# Patient Record
Sex: Female | Born: 1955 | Race: White | Hispanic: No | Marital: Married | Smoking: Current every day smoker
Health system: Southern US, Community
[De-identification: ages and names within clinical notes are randomized; demographics above are authoritative.]

## PROBLEM LIST (undated history)

## (undated) DIAGNOSIS — Z923 Personal history of irradiation: Secondary | ICD-10-CM

## (undated) DIAGNOSIS — T7840XA Allergy, unspecified, initial encounter: Secondary | ICD-10-CM

## (undated) DIAGNOSIS — Z9221 Personal history of antineoplastic chemotherapy: Secondary | ICD-10-CM

## (undated) DIAGNOSIS — H3530 Unspecified macular degeneration: Secondary | ICD-10-CM

## (undated) DIAGNOSIS — I10 Essential (primary) hypertension: Secondary | ICD-10-CM

## (undated) DIAGNOSIS — H15109 Unspecified episcleritis, unspecified eye: Secondary | ICD-10-CM

## (undated) DIAGNOSIS — M1990 Unspecified osteoarthritis, unspecified site: Secondary | ICD-10-CM

## (undated) DIAGNOSIS — E785 Hyperlipidemia, unspecified: Secondary | ICD-10-CM

## (undated) DIAGNOSIS — Z79890 Hormone replacement therapy (postmenopausal): Secondary | ICD-10-CM

## (undated) DIAGNOSIS — F17200 Nicotine dependence, unspecified, uncomplicated: Secondary | ICD-10-CM

## (undated) DIAGNOSIS — C50919 Malignant neoplasm of unspecified site of unspecified female breast: Secondary | ICD-10-CM

## (undated) DIAGNOSIS — J449 Chronic obstructive pulmonary disease, unspecified: Secondary | ICD-10-CM

## (undated) DIAGNOSIS — L409 Psoriasis, unspecified: Secondary | ICD-10-CM

## (undated) DIAGNOSIS — I6522 Occlusion and stenosis of left carotid artery: Secondary | ICD-10-CM

## (undated) DIAGNOSIS — E222 Syndrome of inappropriate secretion of antidiuretic hormone: Secondary | ICD-10-CM

## (undated) DIAGNOSIS — F419 Anxiety disorder, unspecified: Secondary | ICD-10-CM

## (undated) HISTORY — DX: Unspecified macular degeneration: H35.30

## (undated) HISTORY — DX: Syndrome of inappropriate secretion of antidiuretic hormone: E22.2

## (undated) HISTORY — DX: Occlusion and stenosis of left carotid artery: I65.22

## (undated) HISTORY — DX: Hyperlipidemia, unspecified: E78.5

## (undated) HISTORY — DX: Essential (primary) hypertension: I10

## (undated) HISTORY — DX: Psoriasis, unspecified: L40.9

## (undated) HISTORY — PX: TONSILLECTOMY: SUR1361

## (undated) HISTORY — DX: Allergy, unspecified, initial encounter: T78.40XA

## (undated) HISTORY — PX: COLPOSCOPY: SHX161

## (undated) HISTORY — DX: Chronic obstructive pulmonary disease, unspecified: J44.9

## (undated) HISTORY — DX: Nicotine dependence, unspecified, uncomplicated: F17.200

## (undated) HISTORY — DX: Hormone replacement therapy: Z79.890

## (undated) HISTORY — DX: Unspecified episcleritis, unspecified eye: H15.109

## (undated) HISTORY — PX: ABDOMINAL HYSTERECTOMY: SHX81

## (undated) HISTORY — PX: OTHER SURGICAL HISTORY: SHX169

---

## 1998-10-31 ENCOUNTER — Encounter: Admission: RE | Admit: 1998-10-31 | Discharge: 1998-10-31 | Payer: Self-pay | Admitting: Obstetrics & Gynecology

## 1998-10-31 ENCOUNTER — Encounter: Payer: Self-pay | Admitting: Obstetrics & Gynecology

## 2000-02-26 ENCOUNTER — Encounter: Payer: Self-pay | Admitting: Obstetrics & Gynecology

## 2000-02-26 ENCOUNTER — Encounter: Admission: RE | Admit: 2000-02-26 | Discharge: 2000-02-26 | Payer: Self-pay | Admitting: Obstetrics & Gynecology

## 2000-07-28 ENCOUNTER — Other Ambulatory Visit: Admission: RE | Admit: 2000-07-28 | Discharge: 2000-07-28 | Payer: Self-pay | Admitting: Obstetrics & Gynecology

## 2001-04-07 ENCOUNTER — Encounter: Admission: RE | Admit: 2001-04-07 | Discharge: 2001-04-07 | Payer: Self-pay | Admitting: Obstetrics & Gynecology

## 2001-04-07 ENCOUNTER — Encounter: Payer: Self-pay | Admitting: Obstetrics & Gynecology

## 2001-08-25 ENCOUNTER — Other Ambulatory Visit: Admission: RE | Admit: 2001-08-25 | Discharge: 2001-08-25 | Payer: Self-pay | Admitting: Obstetrics & Gynecology

## 2002-02-07 ENCOUNTER — Other Ambulatory Visit: Admission: RE | Admit: 2002-02-07 | Discharge: 2002-02-07 | Payer: Self-pay | Admitting: Obstetrics & Gynecology

## 2002-05-25 ENCOUNTER — Encounter: Payer: Self-pay | Admitting: Obstetrics & Gynecology

## 2002-05-25 ENCOUNTER — Encounter: Admission: RE | Admit: 2002-05-25 | Discharge: 2002-05-25 | Payer: Self-pay | Admitting: Obstetrics & Gynecology

## 2002-06-05 ENCOUNTER — Encounter: Admission: RE | Admit: 2002-06-05 | Discharge: 2002-06-05 | Payer: Self-pay | Admitting: Obstetrics & Gynecology

## 2002-06-05 ENCOUNTER — Encounter: Payer: Self-pay | Admitting: Obstetrics & Gynecology

## 2002-12-14 ENCOUNTER — Encounter: Admission: RE | Admit: 2002-12-14 | Discharge: 2002-12-14 | Payer: Self-pay | Admitting: Obstetrics & Gynecology

## 2003-03-13 ENCOUNTER — Other Ambulatory Visit: Admission: RE | Admit: 2003-03-13 | Discharge: 2003-03-13 | Payer: Self-pay | Admitting: Obstetrics & Gynecology

## 2003-12-13 ENCOUNTER — Encounter: Admission: RE | Admit: 2003-12-13 | Discharge: 2003-12-13 | Payer: Self-pay | Admitting: Obstetrics & Gynecology

## 2004-04-21 ENCOUNTER — Other Ambulatory Visit: Admission: RE | Admit: 2004-04-21 | Discharge: 2004-04-21 | Payer: Self-pay | Admitting: Obstetrics & Gynecology

## 2005-01-11 ENCOUNTER — Encounter: Admission: RE | Admit: 2005-01-11 | Discharge: 2005-01-11 | Payer: Self-pay | Admitting: Obstetrics & Gynecology

## 2006-01-25 ENCOUNTER — Encounter: Admission: RE | Admit: 2006-01-25 | Discharge: 2006-01-25 | Payer: Self-pay | Admitting: Obstetrics & Gynecology

## 2007-01-27 ENCOUNTER — Encounter: Admission: RE | Admit: 2007-01-27 | Discharge: 2007-01-27 | Payer: Self-pay | Admitting: Obstetrics & Gynecology

## 2008-02-28 ENCOUNTER — Encounter: Admission: RE | Admit: 2008-02-28 | Discharge: 2008-02-28 | Payer: Self-pay | Admitting: Obstetrics & Gynecology

## 2009-03-24 ENCOUNTER — Ambulatory Visit (HOSPITAL_COMMUNITY): Admission: RE | Admit: 2009-03-24 | Discharge: 2009-03-24 | Payer: Self-pay | Admitting: Family Medicine

## 2009-06-07 ENCOUNTER — Encounter: Admission: RE | Admit: 2009-06-07 | Discharge: 2009-06-07 | Payer: Self-pay | Admitting: Family Medicine

## 2009-08-29 ENCOUNTER — Encounter: Admission: RE | Admit: 2009-08-29 | Discharge: 2009-08-29 | Payer: Self-pay | Admitting: Obstetrics & Gynecology

## 2010-07-24 ENCOUNTER — Other Ambulatory Visit: Payer: Self-pay | Admitting: Obstetrics & Gynecology

## 2010-07-24 DIAGNOSIS — Z1231 Encounter for screening mammogram for malignant neoplasm of breast: Secondary | ICD-10-CM

## 2010-09-04 ENCOUNTER — Ambulatory Visit
Admission: RE | Admit: 2010-09-04 | Discharge: 2010-09-04 | Disposition: A | Discharge status: Home / Self Care | Payer: 59 | Source: Ambulatory Visit | Attending: Obstetrics & Gynecology | Admitting: Obstetrics & Gynecology

## 2010-09-04 DIAGNOSIS — Z1231 Encounter for screening mammogram for malignant neoplasm of breast: Secondary | ICD-10-CM

## 2011-09-22 ENCOUNTER — Other Ambulatory Visit: Payer: Self-pay | Admitting: Obstetrics & Gynecology

## 2011-09-22 DIAGNOSIS — Z1231 Encounter for screening mammogram for malignant neoplasm of breast: Secondary | ICD-10-CM

## 2011-10-07 ENCOUNTER — Ambulatory Visit
Admission: RE | Admit: 2011-10-07 | Discharge: 2011-10-07 | Disposition: A | Discharge status: Home / Self Care | Payer: BC Managed Care – PPO | Source: Ambulatory Visit | Attending: Obstetrics & Gynecology | Admitting: Obstetrics & Gynecology

## 2011-10-07 DIAGNOSIS — Z1231 Encounter for screening mammogram for malignant neoplasm of breast: Secondary | ICD-10-CM

## 2012-07-26 ENCOUNTER — Other Ambulatory Visit: Payer: Self-pay | Admitting: Family Medicine

## 2012-07-26 DIAGNOSIS — Z1211 Encounter for screening for malignant neoplasm of colon: Secondary | ICD-10-CM

## 2012-11-22 ENCOUNTER — Other Ambulatory Visit: Payer: Self-pay

## 2012-11-22 DIAGNOSIS — Z1231 Encounter for screening mammogram for malignant neoplasm of breast: Secondary | ICD-10-CM

## 2012-12-19 ENCOUNTER — Ambulatory Visit: Payer: BC Managed Care – PPO

## 2012-12-22 ENCOUNTER — Ambulatory Visit: Payer: BC Managed Care – PPO

## 2012-12-26 ENCOUNTER — Other Ambulatory Visit: Payer: Self-pay | Admitting: Family Medicine

## 2012-12-26 MED ORDER — BISOPROLOL-HYDROCHLOROTHIAZIDE 5-6.25 MG PO TABS: 1.0000 | ORAL_TABLET | Freq: Every day | ORAL | Status: DC

## 2012-12-26 NOTE — Telephone Encounter (Signed)
.  Rx Refilled and pt will need .Needs office visit and labs before further refills

## 2012-12-29 ENCOUNTER — Encounter: Payer: Self-pay | Admitting: Family Medicine

## 2012-12-29 ENCOUNTER — Telehealth: Payer: Self-pay | Admitting: Family Medicine

## 2012-12-29 MED ORDER — BISOPROLOL-HYDROCHLOROTHIAZIDE 5-6.25 MG PO TABS: 1.0000 | ORAL_TABLET | Freq: Every day | ORAL | Status: DC

## 2012-12-29 NOTE — Telephone Encounter (Signed)
Medication refill for one time only.  Patient needs to be seen.  Letter sent for patient to call and schedule 

## 2012-12-30 ENCOUNTER — Other Ambulatory Visit: Payer: Self-pay | Admitting: Family Medicine

## 2013-01-12 ENCOUNTER — Ambulatory Visit (INDEPENDENT_AMBULATORY_CARE_PROVIDER_SITE_OTHER): Payer: BC Managed Care – PPO | Admitting: Family Medicine

## 2013-01-12 ENCOUNTER — Encounter: Payer: Self-pay | Admitting: Family Medicine

## 2013-01-12 VITALS — BP 140/82 | HR 62 | Temp 97.4°F | Resp 14 | Ht 64.0 in | Wt 132.0 lb

## 2013-01-12 DIAGNOSIS — H15109 Unspecified episcleritis, unspecified eye: Secondary | ICD-10-CM | POA: Insufficient documentation

## 2013-01-12 DIAGNOSIS — F17200 Nicotine dependence, unspecified, uncomplicated: Secondary | ICD-10-CM | POA: Insufficient documentation

## 2013-01-12 DIAGNOSIS — M2550 Pain in unspecified joint: Secondary | ICD-10-CM

## 2013-01-12 DIAGNOSIS — L409 Psoriasis, unspecified: Secondary | ICD-10-CM | POA: Insufficient documentation

## 2013-01-12 DIAGNOSIS — Z0000 Encounter for general adult medical examination without abnormal findings: Secondary | ICD-10-CM

## 2013-01-12 DIAGNOSIS — Z79890 Hormone replacement therapy (postmenopausal): Secondary | ICD-10-CM | POA: Insufficient documentation

## 2013-01-12 MED ORDER — BISOPROLOL-HYDROCHLOROTHIAZIDE 5-6.25 MG PO TABS: ORAL_TABLET | ORAL | Status: DC

## 2013-01-12 NOTE — Progress Notes (Signed)
Subjective:    Patient ID: Kim Ortega, female    DOB: Sep 26, 1955, 58 y.o.   MRN: [id number]  HPI  Patient is a very pleasant 58 year old white female who presents today for a complete physical exam. Her last tetanus shot was in 2011. She gets her Pap smear and her pelvic exam performed at her GYN. She gets her mammogram and bone density performed at her GYN. She had a flu shot earlier this year her last colonoscopy was in 2004 and is due this year. The patient will call and schedule that. She reports significant pain in her neck, thoracic spine, shoulders, both elbows, and occasionally her knees. She has a history of psoriasis and has active plaques in her hair, on her genital region, and on her elbows. 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 Past Medical History  Diagnosis Date  . Psoriasis   . Hypertension   . Hormone replacement therapy   . Allergy   . Smoker   . Episcleritis    No current outpatient prescriptions on file prior to visit.   No current facility-administered medications on file prior to visit.   No Known Allergies Past Surgical History  Procedure Laterality Date  . Abdominal hysterectomy    . Cervical acdf      C5-6   History   Social History  . Marital Status: Married    Spouse Name: N/A    Number of Children: N/A  . Years of Education: N/A   Occupational History  . Not on file.   Social History Main Topics  . Smoking status: Current Every Day Smoker -- 0.50 packs/day    Types: Cigarettes  . Smokeless tobacco: Not on file  . Alcohol Use: Yes     Comment: Glasses of wine several times a week  . Drug Use: No  . Sexual Activity: Yes     Comment: married   Other Topics Concern  . Not on file   Social History Narrative  . No narrative on file   Family History  Problem Relation Age of Onset  . Stroke Mother      Review of Systems  All other systems reviewed and are negative.       Objective:   Physical Exam    Vitals reviewed. Constitutional: She is oriented to person, place, and time. She appears well-developed and well-nourished. No distress.  HENT:  Head: Normocephalic and atraumatic.  Right Ear: External ear normal.  Left Ear: External ear normal.  Nose: Nose normal.  Mouth/Throat: Oropharynx is clear and moist. No oropharyngeal exudate.  Eyes: Conjunctivae and EOM are normal. Pupils are equal, round, and reactive to light. Right eye exhibits no discharge. Left eye exhibits no discharge. No scleral icterus.  Neck: Normal range of motion. Neck supple. No JVD present. No tracheal deviation present. No thyromegaly present.  Cardiovascular: Normal rate, regular rhythm, normal heart sounds and intact distal pulses.  Exam reveals no gallop and no friction rub.   No murmur heard. Pulmonary/Chest: Effort normal and breath sounds normal. No stridor. No respiratory distress. She has no wheezes. She has no rales. She exhibits no tenderness.  Abdominal: Soft. Bowel sounds are normal. She exhibits no distension and no mass. There is no tenderness. There is no rebound and no guarding.  Musculoskeletal: Normal range of motion. She exhibits no edema and no tenderness.  Lymphadenopathy:    She has no cervical adenopathy.  Neurological: She is alert and oriented to  person, place, and time. She has normal reflexes. She displays normal reflexes. No cranial nerve deficit. She exhibits normal muscle tone. Coordination normal.  Skin: Skin is warm. Rash noted. She is not diaphoretic. No erythema. No pallor.  Psychiatric: She has a normal mood and affect. Her behavior is normal. Judgment and thought content normal.          Assessment & Plan:  1. Polyarthralgia I am concerned about psoriatic arthritis. I will consult rheumatology particularly given her history of iritis and episcleritis and the diffuse nature of her polyarthralgias. I will also check a sedimentation rate. - Ambulatory referral to  Rheumatology  2. Routine general medical examination at a health care facility Patient's immunizations are up-to-date. I recommended smoking cessation. I asked the patient to return fasting for a CBC, CMP, fasting lipid panel, and TSH. I recommended that the patient schedule her colonoscopy. Currently her blood pressure is borderline well controlled. I recommended she decrease sodium in her diet

## 2013-01-18 ENCOUNTER — Other Ambulatory Visit: Payer: BC Managed Care – PPO

## 2013-01-18 DIAGNOSIS — M2550 Pain in unspecified joint: Secondary | ICD-10-CM

## 2013-01-18 DIAGNOSIS — Z0000 Encounter for general adult medical examination without abnormal findings: Secondary | ICD-10-CM

## 2013-01-18 LAB — LIPID PANEL
CHOLESTEROL: 192 mg/dL (ref 0–200)
HDL: 91 mg/dL (ref >39)
LDL Cholesterol: 86 mg/dL (ref 0–99)
TRIGLYCERIDES: 75 mg/dL (ref <150)
Total CHOL/HDL Ratio: 2.1 Ratio
VLDL: 15 mg/dL (ref 0–40)

## 2013-01-18 LAB — CBC WITH DIFFERENTIAL/PLATELET
BASOS ABS: 0 10*3/uL (ref 0.0–0.1)
BASOS PCT: 0 % (ref 0–1)
EOS PCT: 1 % (ref 0–5)
Eosinophils Absolute: 0.1 10*3/uL (ref 0.0–0.7)
HEMATOCRIT: 40.9 % (ref 36.0–46.0)
Hemoglobin: 13.5 g/dL (ref 12.0–15.0)
LYMPHS ABS: 3.2 10*3/uL (ref 0.7–4.0)
LYMPHS PCT: 33 % (ref 12–46)
MCH: 33 pg (ref 26.0–34.0)
MCHC: 33 g/dL (ref 30.0–36.0)
MCV: 100 fL (ref 78.0–100.0)
MONOS PCT: 8 % (ref 3–12)
Monocytes Absolute: 0.8 10*3/uL (ref 0.1–1.0)
Neutro Abs: 5.8 10*3/uL (ref 1.7–7.7)
Neutrophils Relative %: 58 % (ref 43–77)
PLATELETS: 278 10*3/uL (ref 150–400)
RBC: 4.09 MIL/uL (ref 3.87–5.11)
RDW: 13 % (ref 11.5–15.5)
WBC: 9.9 10*3/uL (ref 4.0–10.5)

## 2013-01-18 LAB — COMPREHENSIVE METABOLIC PANEL
ALK PHOS: 39 U/L (ref 39–117)
ALT: 17 U/L (ref 0–35)
AST: 20 U/L (ref 0–37)
Albumin: 4.3 g/dL (ref 3.5–5.2)
BILIRUBIN TOTAL: 0.3 mg/dL (ref 0.3–1.2)
BUN: 11 mg/dL (ref 6–23)
CALCIUM: 9.6 mg/dL (ref 8.4–10.5)
CHLORIDE: 102 meq/L (ref 96–112)
CO2: 27 mEq/L (ref 19–32)
Creat: 0.62 mg/dL (ref 0.50–1.10)
GLUCOSE: 92 mg/dL (ref 70–99)
Potassium: 4.3 mEq/L (ref 3.5–5.3)
Sodium: 141 mEq/L (ref 135–145)
TOTAL PROTEIN: 6.7 g/dL (ref 6.0–8.3)

## 2013-01-18 LAB — TSH: TSH: 1.603 u[IU]/mL (ref 0.350–4.500)

## 2013-01-18 LAB — SEDIMENTATION RATE: Sed Rate: 1 mm/hr (ref 0–22)

## 2013-01-19 ENCOUNTER — Ambulatory Visit: Payer: BC Managed Care – PPO

## 2013-01-20 ENCOUNTER — Encounter: Payer: Self-pay | Admitting: *Deleted

## 2013-01-26 ENCOUNTER — Ambulatory Visit
Admission: RE | Admit: 2013-01-26 | Discharge: 2013-01-26 | Disposition: A | Discharge status: Home / Self Care | Payer: BC Managed Care – PPO | Source: Ambulatory Visit

## 2013-01-26 DIAGNOSIS — Z1231 Encounter for screening mammogram for malignant neoplasm of breast: Secondary | ICD-10-CM

## 2013-02-06 ENCOUNTER — Other Ambulatory Visit: Payer: Self-pay | Admitting: Family Medicine

## 2013-07-24 ENCOUNTER — Other Ambulatory Visit: Payer: Self-pay | Admitting: Family Medicine

## 2013-07-24 ENCOUNTER — Telehealth: Payer: Self-pay | Admitting: *Deleted

## 2013-07-24 DIAGNOSIS — Z1211 Encounter for screening for malignant neoplasm of colon: Secondary | ICD-10-CM

## 2013-07-24 NOTE — Telephone Encounter (Signed)
Called pt back in regarding referral to have colonoscopy, in her records showed she had appointment 08/2012, pt states she never went to have it done. I told pt I will place referral to [facility] since that was last one she went to. Pt agrees that would be fine.

## 2013-07-24 NOTE — Telephone Encounter (Signed)
Message copied by Maureen Chatters on Tue Jul 24, 2013 12:14 PM ------      Message from: Lenore Manner      Created: Tue Jul 24, 2013 10:10 AM      Regarding: referral       Contact: [phone]       Pt is calling because she is wanting a referral to have her colonscopy                        Friday's the best, please schedule a little ways out so she is able to block out her schedule ------

## 2013-09-12 ENCOUNTER — Telehealth: Payer: Self-pay | Admitting: Family Medicine

## 2013-09-12 NOTE — Telephone Encounter (Signed)
Requesting a refill on Tramadol 50mg  1 tab po q 6 hrs prn pain - I do not see it in EPIC however it states on fax that her last refill was 01/15/13  - ? OK to Refill

## 2013-09-13 MED ORDER — TRAMADOL HCL 50 MG PO TABS: 50.0000 mg | ORAL_TABLET | Freq: Four times a day (QID) | ORAL | Status: DC | PRN

## 2013-09-13 NOTE — Telephone Encounter (Signed)
Ok to refill for quantity 120?

## 2013-09-13 NOTE — Telephone Encounter (Signed)
Medication called to pharmacy. 

## 2013-09-13 NOTE — Telephone Encounter (Signed)
ok 

## 2013-09-18 ENCOUNTER — Other Ambulatory Visit: Payer: Self-pay | Admitting: Family Medicine

## 2013-10-25 ENCOUNTER — Other Ambulatory Visit: Payer: Self-pay | Admitting: Gastroenterology

## 2013-11-27 ENCOUNTER — Encounter (HOSPITAL_COMMUNITY): Payer: Self-pay

## 2013-11-27 ENCOUNTER — Ambulatory Visit (HOSPITAL_COMMUNITY): Admit: 2013-11-27 | Payer: Self-pay | Admitting: Gastroenterology

## 2013-11-27 SURGERY — COLONOSCOPY WITH PROPOFOL
Anesthesia: Monitor Anesthesia Care

## 2013-12-25 ENCOUNTER — Telehealth: Payer: Self-pay | Admitting: Family Medicine

## 2013-12-25 ENCOUNTER — Encounter: Payer: Self-pay | Admitting: Family Medicine

## 2013-12-25 MED ORDER — BISOPROLOL-HYDROCHLOROTHIAZIDE 5-6.25 MG PO TABS: ORAL_TABLET | ORAL | Status: DC

## 2013-12-25 NOTE — Telephone Encounter (Signed)
Medication refill for one time only.  Patient needs to be seen.  Letter sent for patient to call and schedule 

## 2014-01-01 ENCOUNTER — Telehealth: Payer: Self-pay | Admitting: Family Medicine

## 2014-01-01 MED ORDER — BISOPROLOL-HYDROCHLOROTHIAZIDE 5-6.25 MG PO TABS: ORAL_TABLET | ORAL | Status: DC

## 2014-01-01 NOTE — Telephone Encounter (Signed)
Med sent to pharm 

## 2014-01-01 NOTE — Telephone Encounter (Signed)
Patient is calling to get refill on her blood pressure medication, she is scheduled for a cpe in January  [phone] if any questions

## 2014-01-17 ENCOUNTER — Other Ambulatory Visit: Payer: Self-pay

## 2014-01-24 ENCOUNTER — Encounter: Payer: Self-pay | Admitting: Family Medicine

## 2014-01-24 ENCOUNTER — Other Ambulatory Visit: Payer: Self-pay

## 2014-01-24 ENCOUNTER — Ambulatory Visit (INDEPENDENT_AMBULATORY_CARE_PROVIDER_SITE_OTHER): Payer: BLUE CROSS/BLUE SHIELD | Admitting: Family Medicine

## 2014-01-24 VITALS — BP 152/90 | HR 68 | Temp 97.9°F | Resp 16 | Ht 64.0 in | Wt 137.0 lb

## 2014-01-24 DIAGNOSIS — Z0000 Encounter for general adult medical examination without abnormal findings: Secondary | ICD-10-CM

## 2014-01-24 DIAGNOSIS — Z1231 Encounter for screening mammogram for malignant neoplasm of breast: Secondary | ICD-10-CM

## 2014-01-24 NOTE — Progress Notes (Signed)
Subjective:    Patient ID: Kim Ortega, female    DOB: 11-14-1955, 59 y.o.   MRN: [id number]  HPI Patient is a very pleasant 59 year old white female who is here today for complete physical exam. She is scheduled for colonoscopy in April. She has a hysterectomy and therefore does not require Pap smears. Her mammogram was performed last January and she has another one are scheduled for this month. Her bone density is up-to-date. Her flu shot is up-to-date. 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 Unfortunately she continues to smoke. She denies any chest pain shortness of breath or dyspnea on exertion. Past Medical History  Diagnosis Date  . Psoriasis   . Hypertension   . Hormone replacement therapy   . Allergy   . Smoker   . Episcleritis    Past Surgical History  Procedure Laterality Date  . Abdominal hysterectomy    . Cervical acdf      C5-6   Current Outpatient Prescriptions on File Prior to Visit  Medication Sig Dispense Refill  . aspirin 81 MG tablet Take 81 mg by mouth daily.    . bisoprolol-hydrochlorothiazide (ZIAC) 5-6.25 MG per tablet TAKE 2 TABLETS BY MOUTH DAILY 60 tablet 0  . estradiol (ESTRACE) 0.5 MG tablet Take 0.5 mg by mouth daily.    . fluticasone (FLONASE) 50 MCG/ACT nasal spray TAKE 2 SPRAY IN EACH NOSTRIL DAILY 16 g 11  . traMADol (ULTRAM) 50 MG tablet Take 1 tablet (50 mg total) by mouth every 6 (six) hours as needed. 120 tablet 0   No current facility-administered medications on file prior to visit.   No Known Allergies History   Social History  . Marital Status: Married    Spouse Name: N/A    Number of Children: N/A  . Years of Education: N/A   Occupational History  . Not on file.   Social History Main Topics  . Smoking status: Current Every Day Smoker -- 0.50 packs/day    Types: Cigarettes  .  Smokeless tobacco: Not on file  . Alcohol Use: Yes     Comment: Glasses of wine several times a week  . Drug Use: No  . Sexual Activity: Yes     Comment: married   Other Topics Concern  . Not on file   Social History Narrative   Family History  Problem Relation Age of Onset  . Stroke Mother       Review of Systems  All other systems reviewed and are negative.      Objective:   Physical Exam  Constitutional: She is oriented to person, place, and time. She appears well-developed and well-nourished. No distress.  HENT:  Head: Normocephalic and atraumatic.  Right Ear: External ear normal.  Left Ear: External ear normal.  Nose: Nose normal.  Mouth/Throat: Oropharynx is clear and moist. No oropharyngeal exudate.  Eyes: Conjunctivae and EOM are normal. Pupils are equal, round, and reactive to light. Right eye exhibits no discharge. Left eye exhibits no discharge. No scleral icterus.  Neck: Normal range of motion. Neck supple. No JVD present. No tracheal deviation present. No thyromegaly present.  Cardiovascular: Normal rate, regular rhythm, normal heart sounds and intact distal pulses.  Exam reveals no gallop and no friction rub.   No murmur heard. Pulmonary/Chest: Effort normal and breath sounds normal.  No stridor. No respiratory distress. She has no wheezes. She has no rales. She exhibits no tenderness.  Abdominal: Soft. Bowel sounds are normal. She exhibits no distension and no mass. There is no tenderness. There is no rebound and no guarding.  Musculoskeletal: Normal range of motion.  Lymphadenopathy:    She has no cervical adenopathy.  Neurological: She is alert and oriented to person, place, and time. She has normal reflexes. She displays normal reflexes. No cranial nerve deficit. She exhibits normal muscle tone. Coordination normal.  Skin: Rash noted. She is not diaphoretic.  Psychiatric: She has a normal mood and affect. Her behavior is normal. Judgment and thought  content normal.  Vitals reviewed.         Assessment & Plan:  Routine general medical examination at a health care facility  Patient's physical exam is normal. I recommended she check her blood pressure everyday for several weeks and report the values to me. If her blood pressure is elevated, I would increase ziac.  I also recommended smoking cessation. The remainder of her preventative care is up-to-date. I would like the patient to return fasting for a CBC, CMP, fasting lipid panel, and TSH. Otherwise regular anticipatory guidance was provided.

## 2014-02-01 ENCOUNTER — Other Ambulatory Visit: Payer: BLUE CROSS/BLUE SHIELD

## 2014-02-01 DIAGNOSIS — Z0000 Encounter for general adult medical examination without abnormal findings: Secondary | ICD-10-CM

## 2014-02-01 DIAGNOSIS — F17200 Nicotine dependence, unspecified, uncomplicated: Secondary | ICD-10-CM

## 2014-02-01 DIAGNOSIS — Z79890 Hormone replacement therapy (postmenopausal): Secondary | ICD-10-CM

## 2014-02-01 DIAGNOSIS — I10 Essential (primary) hypertension: Secondary | ICD-10-CM

## 2014-02-01 DIAGNOSIS — Z79899 Other long term (current) drug therapy: Secondary | ICD-10-CM

## 2014-02-01 LAB — COMPLETE METABOLIC PANEL WITH GFR
ALT: 15 U/L (ref 0–35)
AST: 16 U/L (ref 0–37)
Albumin: 4 g/dL (ref 3.5–5.2)
Alkaline Phosphatase: 40 U/L (ref 39–117)
BILIRUBIN TOTAL: 0.5 mg/dL (ref 0.2–1.2)
BUN: 16 mg/dL (ref 6–23)
CO2: 26 meq/L (ref 19–32)
Calcium: 9.2 mg/dL (ref 8.4–10.5)
Chloride: 101 mEq/L (ref 96–112)
Creat: 0.71 mg/dL (ref 0.50–1.10)
GFR, Est African American: >89 mL/min
Glucose, Bld: 85 mg/dL (ref 70–99)
Potassium: 4.3 mEq/L (ref 3.5–5.3)
Sodium: 139 mEq/L (ref 135–145)
Total Protein: 6.2 g/dL (ref 6.0–8.3)

## 2014-02-01 LAB — CBC WITH DIFFERENTIAL/PLATELET
BASOS PCT: 0 % (ref 0–1)
Basophils Absolute: 0 10*3/uL (ref 0.0–0.1)
Eosinophils Absolute: 0.2 10*3/uL (ref 0.0–0.7)
Eosinophils Relative: 2 % (ref 0–5)
HEMATOCRIT: 39.5 % (ref 36.0–46.0)
Hemoglobin: 13.2 g/dL (ref 12.0–15.0)
Lymphocytes Relative: 39 % (ref 12–46)
Lymphs Abs: 3.6 10*3/uL (ref 0.7–4.0)
MCH: 32.9 pg (ref 26.0–34.0)
MCHC: 33.4 g/dL (ref 30.0–36.0)
MCV: 98.5 fL (ref 78.0–100.0)
MPV: 9.5 fL (ref 8.6–12.4)
Monocytes Absolute: 0.7 10*3/uL (ref 0.1–1.0)
Monocytes Relative: 7 % (ref 3–12)
NEUTROS ABS: 4.8 10*3/uL (ref 1.7–7.7)
NEUTROS PCT: 52 % (ref 43–77)
Platelets: 247 10*3/uL (ref 150–400)
RBC: 4.01 MIL/uL (ref 3.87–5.11)
RDW: 13.1 % (ref 11.5–15.5)
WBC: 9.3 10*3/uL (ref 4.0–10.5)

## 2014-02-01 LAB — LIPID PANEL
CHOLESTEROL: 185 mg/dL (ref 0–200)
HDL: 90 mg/dL (ref >39)
LDL CALC: 77 mg/dL (ref 0–99)
Total CHOL/HDL Ratio: 2.1 Ratio
Triglycerides: 88 mg/dL (ref <150)
VLDL: 18 mg/dL (ref 0–40)

## 2014-02-01 LAB — TSH: TSH: 1.462 u[IU]/mL (ref 0.350–4.500)

## 2014-02-07 ENCOUNTER — Telehealth: Payer: Self-pay | Admitting: Family Medicine

## 2014-02-07 DIAGNOSIS — I10 Essential (primary) hypertension: Secondary | ICD-10-CM

## 2014-02-07 NOTE — Telephone Encounter (Signed)
LMTRC

## 2014-02-07 NOTE — Telephone Encounter (Signed)
Patient is calling to say per dr pickard she was supposed to keep an average on her blood pressure readings and the average is 148--/96 and would like to know if he can increase her blood pressure meds if possible  [facility] [phone] if any questions

## 2014-02-07 NOTE — Telephone Encounter (Signed)
Tell the patient to double her blood pressure medication I believe she is on 5/6.25 she can double this to 10/12.5

## 2014-02-08 MED ORDER — BISOPROLOL-HYDROCHLOROTHIAZIDE 10-6.25 MG PO TABS: 1.0000 | ORAL_TABLET | Freq: Every day | ORAL | Status: DC

## 2014-02-08 MED ORDER — LOSARTAN POTASSIUM 50 MG PO TABS: 50.0000 mg | ORAL_TABLET | Freq: Every day | ORAL | Status: DC

## 2014-02-08 NOTE — Telephone Encounter (Signed)
Pt calling back.  Per provider to take two Ziac tablets daily.  Pt states already has been doing that for last year.  Asked provider.  Ziac changed to 10/6.25 dose once daily and added Losartan 50 mg daily.  Pt informed and Rx's to pharmacy.  Told pt to continue to monitor BP at home, call with any abnormal readings.  Scheduled 3 mth follow appt.

## 2014-02-13 ENCOUNTER — Ambulatory Visit
Admission: RE | Admit: 2014-02-13 | Discharge: 2014-02-13 | Disposition: A | Discharge status: Home / Self Care | Payer: BLUE CROSS/BLUE SHIELD | Source: Ambulatory Visit

## 2014-02-13 DIAGNOSIS — Z1231 Encounter for screening mammogram for malignant neoplasm of breast: Secondary | ICD-10-CM

## 2014-02-15 ENCOUNTER — Other Ambulatory Visit: Payer: Self-pay | Admitting: Obstetrics & Gynecology

## 2014-02-15 DIAGNOSIS — R928 Other abnormal and inconclusive findings on diagnostic imaging of breast: Secondary | ICD-10-CM

## 2014-02-26 ENCOUNTER — Ambulatory Visit
Admission: RE | Admit: 2014-02-26 | Discharge: 2014-02-26 | Disposition: A | Discharge status: Home / Self Care | Payer: BLUE CROSS/BLUE SHIELD | Source: Ambulatory Visit | Attending: Obstetrics & Gynecology | Admitting: Obstetrics & Gynecology

## 2014-02-26 ENCOUNTER — Other Ambulatory Visit: Payer: Self-pay | Admitting: Obstetrics & Gynecology

## 2014-02-26 DIAGNOSIS — R928 Other abnormal and inconclusive findings on diagnostic imaging of breast: Secondary | ICD-10-CM

## 2014-04-11 ENCOUNTER — Other Ambulatory Visit: Payer: Self-pay | Admitting: Gastroenterology

## 2014-04-12 ENCOUNTER — Encounter (HOSPITAL_COMMUNITY): Payer: Self-pay | Admitting: *Deleted

## 2014-04-16 ENCOUNTER — Encounter (HOSPITAL_COMMUNITY): Payer: Self-pay | Admitting: Gastroenterology

## 2014-04-16 ENCOUNTER — Ambulatory Visit (HOSPITAL_COMMUNITY): Payer: BLUE CROSS/BLUE SHIELD | Admitting: Anesthesiology

## 2014-04-16 ENCOUNTER — Encounter (HOSPITAL_COMMUNITY)
Admission: RE | Disposition: A | Discharge status: Home / Self Care | Payer: BLUE CROSS/BLUE SHIELD | Source: Ambulatory Visit | Attending: Gastroenterology

## 2014-04-16 ENCOUNTER — Ambulatory Visit (HOSPITAL_COMMUNITY)
Admission: RE | Admit: 2014-04-16 | Discharge: 2014-04-16 | Disposition: A | Discharge status: Home / Self Care | Payer: BLUE CROSS/BLUE SHIELD | Source: Ambulatory Visit | Attending: Gastroenterology | Admitting: Gastroenterology

## 2014-04-16 DIAGNOSIS — Z90710 Acquired absence of both cervix and uterus: Secondary | ICD-10-CM | POA: Diagnosis not present

## 2014-04-16 DIAGNOSIS — I10 Essential (primary) hypertension: Secondary | ICD-10-CM | POA: Diagnosis not present

## 2014-04-16 DIAGNOSIS — F17210 Nicotine dependence, cigarettes, uncomplicated: Secondary | ICD-10-CM | POA: Diagnosis not present

## 2014-04-16 DIAGNOSIS — M1990 Unspecified osteoarthritis, unspecified site: Secondary | ICD-10-CM | POA: Insufficient documentation

## 2014-04-16 DIAGNOSIS — Z1211 Encounter for screening for malignant neoplasm of colon: Secondary | ICD-10-CM | POA: Insufficient documentation

## 2014-04-16 DIAGNOSIS — Z7982 Long term (current) use of aspirin: Secondary | ICD-10-CM | POA: Diagnosis not present

## 2014-04-16 HISTORY — DX: Anxiety disorder, unspecified: F41.9

## 2014-04-16 HISTORY — PX: COLONOSCOPY WITH PROPOFOL: SHX5780

## 2014-04-16 HISTORY — DX: Unspecified osteoarthritis, unspecified site: M19.90

## 2014-04-16 SURGERY — COLONOSCOPY WITH PROPOFOL
Anesthesia: Monitor Anesthesia Care

## 2014-04-16 MED ORDER — LACTATED RINGERS IV SOLN
INTRAVENOUS | Status: DC
  Administered 2014-04-16: 1000 mL via INTRAVENOUS

## 2014-04-16 MED ORDER — PROPOFOL 10 MG/ML IV BOLUS
INTRAVENOUS | Status: AC
  Filled 2014-04-16: qty 20

## 2014-04-16 MED ORDER — SODIUM CHLORIDE 0.9 % IV SOLN: INTRAVENOUS | Status: DC

## 2014-04-16 MED ORDER — PROPOFOL 10 MG/ML IV BOLUS
INTRAVENOUS | Status: DC | PRN
  Administered 2014-04-16 (×2): 20 mg via INTRAVENOUS
  Administered 2014-04-16: 50 mg via INTRAVENOUS
  Administered 2014-04-16: 20 mg via INTRAVENOUS
  Administered 2014-04-16: 50 mg via INTRAVENOUS
  Administered 2014-04-16: 20 mg via INTRAVENOUS
  Administered 2014-04-16: 10 mg via INTRAVENOUS
  Administered 2014-04-16: 30 mg via INTRAVENOUS
  Administered 2014-04-16 (×2): 10 mg via INTRAVENOUS

## 2014-04-16 SURGICAL SUPPLY — 21 items

## 2014-04-16 NOTE — Transfer of Care (Signed)
Immediate Anesthesia Transfer of Care Note  Patient: Kim Ortega  Procedure(s) Performed: Procedure(s): COLONOSCOPY WITH PROPOFOL (N/A)  Patient Location: PACU  Anesthesia Type:MAC  Level of Consciousness: sedated  Airway & Oxygen Therapy: Patient Spontanous Breathing and Patient connected to nasal cannula oxygen  Post-op Assessment: Report given to RN and Post -op Vital signs reviewed and stable  Post vital signs: Reviewed and stable  Last Vitals:  Filed Vitals:   04/16/14 0818  BP: 125/67  Pulse: 67  Temp: 36.8 C  Resp: 19    Complications: No apparent anesthesia complications

## 2014-04-16 NOTE — Anesthesia Postprocedure Evaluation (Signed)
  Anesthesia Post-op Note  Patient: Kim Ortega  Procedure(s) Performed: Procedure(s) (LRB): COLONOSCOPY WITH PROPOFOL (N/A)  Patient Location: PACU  Anesthesia Type: MAC  Level of Consciousness: awake and alert   Airway and Oxygen Therapy: Patient Spontanous Breathing  Post-op Pain: mild  Post-op Assessment: Post-op Vital signs reviewed, Patient's Cardiovascular Status Stable, Respiratory Function Stable, Patent Airway and No signs of Nausea or vomiting  Last Vitals:  Filed Vitals:   04/16/14 1004  BP:   Pulse: 75  Temp: 37 C  Resp: 18    Post-op Vital Signs: stable   Complications: No apparent anesthesia complications

## 2014-04-16 NOTE — H&P (Signed)
  Procedure: Screening colonoscopy. 07/10/2002 normal screening colonoscopy using propofol sedation  History: The patient is a 59 year old female born August 28, 1955. She is scheduled to undergo a repeat screening colonoscopy  Medication allergies: None  Past medical history: Hypertension. Psoriatic arthritis. Hysterectomy. Cervical spine fusion surgery  Family history: No family history of colon cancer  Habits: The patient smokes one half pack of cigarettes per day.  Exam: Patient is alert and lying comfortably on the endoscopy stretcher. Abdomen is soft and nontender to palpation. Cardiac exam reveals a regular rhythm. Lungs are clear to auscultation.  Plan: Proceed with repeat screening colonoscopy using propofol sedation

## 2014-04-16 NOTE — Op Note (Signed)
Procedure: Screening colonoscopy. 07/10/2002 normal screening colonoscopy using propofol sedation  Endoscopist: Earle Gell  Premedication: Propofol administered by anesthesia  Procedure: The patient was placed in the left lateral decubitus position. Anal inspection and digital rectal exam were normal. The Pentax pediatric colonoscope was introduced into the rectum and advanced to the cecum. A normal-appearing ileocecal valve and appendiceal orifice were identified. Colonic preparation for the exam today was good. Withdrawal time was 9 minutes  Rectum. Normal. Retroflexed view of the distal rectum normal  Sigmoid colon and descending colon. Normal  Splenic flexure. Normal  Transverse colon. Normal  Hepatic flexure. Normal  Ascending colon. Normal  Cecum and ileocecal valve. Normal  Assessment: Normal screening colonoscopy  Recommendation: Schedule repeat screening colonoscopy in 10 years

## 2014-04-16 NOTE — Discharge Instructions (Signed)

## 2014-04-16 NOTE — Anesthesia Preprocedure Evaluation (Signed)
Anesthesia Evaluation  Patient identified by MRN, date of birth, ID band Patient awake    Reviewed: Allergy & Precautions, NPO status , Patient's Chart, lab work & pertinent test results  Airway Mallampati: II  TM Distance: >3 FB Neck ROM: Full    Dental no notable dental hx.    Pulmonary Current Smoker,  breath sounds clear to auscultation  Pulmonary exam normal       Cardiovascular hypertension, Pt. on medications and Pt. on home beta blockers Rhythm:Regular Rate:Normal     Neuro/Psych negative neurological ROS  negative psych ROS   GI/Hepatic negative GI ROS, Neg liver ROS,   Endo/Other  negative endocrine ROS  Renal/GU negative Renal ROS  negative genitourinary   Musculoskeletal negative musculoskeletal ROS (+)   Abdominal   Peds negative pediatric ROS (+)  Hematology negative hematology ROS (+)   Anesthesia Other Findings   Reproductive/Obstetrics negative OB ROS                             Anesthesia Physical Anesthesia Plan  ASA: II  Anesthesia Plan: MAC   Post-op Pain Management:    Induction: Intravenous  Airway Management Planned: Simple Face Mask  Additional Equipment:   Intra-op Plan:   Post-operative Plan:   Informed Consent: I have reviewed the patients History and Physical, chart, labs and discussed the procedure including the risks, benefits and alternatives for the proposed anesthesia with the patient or authorized representative who has indicated his/her understanding and acceptance.   Dental advisory given  Plan Discussed with: CRNA and Surgeon  Anesthesia Plan Comments:         Anesthesia Quick Evaluation

## 2014-04-17 ENCOUNTER — Encounter (HOSPITAL_COMMUNITY): Payer: Self-pay | Admitting: Gastroenterology

## 2014-05-08 ENCOUNTER — Other Ambulatory Visit: Payer: Self-pay | Admitting: Family Medicine

## 2014-05-10 ENCOUNTER — Ambulatory Visit (INDEPENDENT_AMBULATORY_CARE_PROVIDER_SITE_OTHER): Payer: BLUE CROSS/BLUE SHIELD | Admitting: Family Medicine

## 2014-05-10 ENCOUNTER — Encounter: Payer: Self-pay | Admitting: Family Medicine

## 2014-05-10 VITALS — BP 160/96 | HR 60 | Temp 98.2°F | Resp 18 | Ht 63.0 in | Wt 132.0 lb

## 2014-05-10 DIAGNOSIS — I10 Essential (primary) hypertension: Secondary | ICD-10-CM | POA: Diagnosis not present

## 2014-05-10 LAB — BASIC METABOLIC PANEL WITH GFR
BUN: 17 mg/dL (ref 6–23)
CALCIUM: 9.8 mg/dL (ref 8.4–10.5)
CO2: 24 mEq/L (ref 19–32)
Chloride: 99 mEq/L (ref 96–112)
Creat: 0.87 mg/dL (ref 0.50–1.10)
GFR, EST AFRICAN AMERICAN: 84 mL/min
GFR, Est Non African American: 73 mL/min
Glucose, Bld: 88 mg/dL (ref 70–99)
Potassium: 4.6 mEq/L (ref 3.5–5.3)
Sodium: 138 mEq/L (ref 135–145)

## 2014-05-10 MED ORDER — BISOPROLOL-HYDROCHLOROTHIAZIDE 10-6.25 MG PO TABS: 1.0000 | ORAL_TABLET | Freq: Every day | ORAL | Status: DC

## 2014-05-10 MED ORDER — LOSARTAN POTASSIUM 50 MG PO TABS: 50.0000 mg | ORAL_TABLET | Freq: Every day | ORAL | Status: DC

## 2014-05-10 MED ORDER — FLUTICASONE PROPIONATE 50 MCG/ACT NA SUSP: NASAL | Status: DC

## 2014-05-10 NOTE — Progress Notes (Signed)
Subjective:    Patient ID: Kim Ortega, female    DOB: 06/18/1955, 59 y.o.   MRN: [id number]  HPI Patient is here today for follow-up. Her blood pressure is significantly elevated. 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 Her blood pressure is always consistently 120/80. Today seems to be a complete aberration. She denies any chest pain shortness of breath or dyspnea on exertion. Mammogram, colonoscopy, and Pap smear up-to-date. Most recent lab work is listed below: No visits with results within 1 Month(s) from this visit. Latest known visit with results is:  Lab on 02/01/2014  Component Date Value Ref Range Status  . Sodium 02/01/2014 139  135 - 145 mEq/L Final  . Potassium 02/01/2014 4.3  3.5 - 5.3 mEq/L Final  . Chloride 02/01/2014 101  96 - 112 mEq/L Final  . CO2 02/01/2014 26  19 - 32 mEq/L Final  . Glucose, Bld 02/01/2014 85  70 - 99 mg/dL Final  . BUN 02/01/2014 16  6 - 23 mg/dL Final  . Creat 02/01/2014 0.71  0.50 - 1.10 mg/dL Final  . Total Bilirubin 02/01/2014 0.5  0.2 - 1.2 mg/dL Final  . Alkaline Phosphatase 02/01/2014 40  39 - 117 U/L Final  . AST 02/01/2014 16  0 - 37 U/L Final  . ALT 02/01/2014 15  0 - 35 U/L Final  . Total Protein 02/01/2014 6.2  6.0 - 8.3 g/dL Final  . Albumin 02/01/2014 4.0  3.5 - 5.2 g/dL Final  . Calcium 02/01/2014 9.2  8.4 - 10.5 mg/dL Final  . GFR, Est African American 02/01/2014 >89   Final  . GFR, Est Non African American 02/01/2014 >89   Final   Comment:   The estimated GFR is a calculation valid for adults (>=73 years old) that uses the CKD-EPI algorithm to adjust for age and sex. It is   not to be used for children, pregnant women, hospitalized patients,    patients on dialysis, or with rapidly changing kidney function. According to the NKDEP, eGFR >89 is normal, 60-89 shows mild impairment, 30-59 shows moderate impairment, 15-29 shows severe impairment and <15 is ESRD.     [name]  Cholesterol 02/01/2014 185  0 - 200 mg/dL Final   Comment: ATP III Classification:       < 200        mg/dL        Desirable      200 - 239     mg/dL        Borderline High      >= 240        mg/dL        High     . Triglycerides 02/01/2014 88  <150 mg/dL Final  . HDL 02/01/2014 90  >39 mg/dL Final  . Total CHOL/HDL Ratio 02/01/2014 2.1   Final  . VLDL 02/01/2014 18  0 - 40 mg/dL Final  . LDL Cholesterol 02/01/2014 77  0 - 99 mg/dL Final   Comment:   Total Cholesterol/HDL Ratio:CHD Risk                        Coronary Heart Disease Risk Table                                        Men  Women          1/2 Average Risk              3.4        3.3              Average Risk              5.0        4.4           2X Average Risk              9.6        7.1           3X Average Risk             23.4       11.0 Use the calculated Patient Ratio above and the CHD Risk table  to determine the patient's CHD Risk. ATP III Classification (LDL):       < 100        mg/dL         Optimal      100 - 129     mg/dL         Near or Above Optimal      130 - 159     mg/dL         Borderline High      160 - 189     mg/dL         High       > 190        mg/dL         Very High     . WBC 02/01/2014 9.3  4.0 - 10.5 K/uL Final  . RBC 02/01/2014 4.01  3.87 - 5.11 MIL/uL Final  . Hemoglobin 02/01/2014 13.2  12.0 - 15.0 g/dL Final  . HCT 02/01/2014 39.5  36.0 - 46.0 % Final  . MCV 02/01/2014 98.5  78.0 - 100.0 fL Final  . MCH 02/01/2014 32.9  26.0 - 34.0 pg Final  . MCHC 02/01/2014 33.4  30.0 - 36.0 g/dL Final  . RDW 02/01/2014 13.1  11.5 - 15.5 % Final  . Platelets 02/01/2014 247  150 - 400 K/uL Final  . MPV 02/01/2014 9.5  8.6 - 12.4 fL Final   ** Please note change in reference range(s). **  . Neutrophils Relative % 02/01/2014 52  43 - 77 % Final  . Neutro Abs 02/01/2014 4.8  1.7 - 7.7 K/uL Final  . Lymphocytes Relative 02/01/2014 39  12 - 46 % Final  . Lymphs Abs 02/01/2014 3.6  0.7 - 4.0 K/uL  Final  . Monocytes Relative 02/01/2014 7  3 - 12 % Final  . Monocytes Absolute 02/01/2014 0.7  0.1 - 1.0 K/uL Final  . Eosinophils Relative 02/01/2014 2  0 - 5 % Final  . Eosinophils Absolute 02/01/2014 0.2  0.0 - 0.7 K/uL Final  . Basophils Relative 02/01/2014 0  0 - 1 % Final  . Basophils Absolute 02/01/2014 0.0  0.0 - 0.1 K/uL Final  . Smear Review 02/01/2014 Criteria for review not met   Final  . TSH 02/01/2014 1.462  0.350 - 4.500 uIU/mL Final    Past Medical History  Diagnosis Date  . Psoriasis   . Hypertension   . Hormone replacement therapy   . Allergy   . Smoker   . Episcleritis   . Anxiety     mild no regular meds  .  Arthritis     psoriatic arthritis -back   Past Surgical History  Procedure Laterality Date  . Abdominal hysterectomy    . Cervical acdf      C5-6- fusion with plates/screws "Dr. Sherwood Gambler" '12  . Tonsillectomy      child  . Laporotomy      Ovarian cyst  . Colposcopy    . Colonoscopy with propofol N/A 04/16/2014    Procedure: COLONOSCOPY WITH PROPOFOL;  Surgeon: Garlan Fair, MD;  Location: [facility];  Service: Endoscopy;  Laterality: N/A;   Current Outpatient Prescriptions on File Prior to Visit  Medication Sig Dispense Refill  . ALPRAZolam (XANAX) 0.5 MG tablet Take 0.5 mg by mouth 3 (three) times daily as needed for anxiety.    [name] aspirin 81 MG tablet Take 81 mg by mouth daily.    . bisoprolol-hydrochlorothiazide (ZIAC) 10-6.25 MG per tablet TAKE 1 TABLET BY MOUTH DAILY. 90 tablet 3  . Calcium Carbonate-Vitamin D (CALCIUM-D PO) Take 1,200 mg by mouth 2 (two) times daily.    . cetirizine (ZYRTEC) 10 MG tablet Take 10 mg by mouth daily.    [name] desonide (DESOWEN) 0.05 % cream Apply 1 application topically daily. Psoriasis patches    . diclofenac sodium (VOLTAREN) 1 % GEL Apply 2 g topically 3 (three) times daily as needed (Pain).     [name] estradiol (ESTRACE) 2 MG tablet Take 2 mg by mouth daily.    . fluticasone (FLONASE) 50 MCG/ACT nasal spray TAKE  2 SPRAY IN EACH NOSTRIL DAILY 16 g 11  . Ketotifen Fumarate (ALLERGY EYE DROPS OP) Apply 1 drop to eye 2 (two) times daily as needed (allegies).    . losartan (COZAAR) 50 MG tablet TAKE 1 TABLET BY MOUTH ONCE DAILY. 90 tablet 3  . Multiple Vitamin (MULTIVITAMIN WITH MINERALS) TABS tablet Take 1 tablet by mouth daily.    . traMADol (ULTRAM) 50 MG tablet Take 1 tablet (50 mg total) by mouth every 6 (six) hours as needed. (Patient taking differently: Take 50 mg by mouth every 6 (six) hours as needed for moderate pain. ) 120 tablet 0   No current facility-administered medications on file prior to visit.   No Known Allergies History   Social History  . Marital Status: Married    Spouse Name: N/A  . Number of Children: N/A  . Years of Education: N/A   Occupational History  . Not on file.   Social History Main Topics  . Smoking status: Current Every Day Smoker -- 0.50 packs/day for 20 years    Types: Cigarettes  . Smokeless tobacco: Not on file  . Alcohol Use: Yes     Comment: Glasses of wine several times a week  . Drug Use: No  . Sexual Activity: Yes     Comment: married   Other Topics Concern  . Not on file   Social History Narrative   Family History  Problem Relation Age of Onset  . Stroke Mother      Review of Systems  All other systems reviewed and are negative.      Objective:   Physical Exam  Constitutional: She appears well-developed and well-nourished.  Cardiovascular: Normal rate, regular rhythm and normal heart sounds.   No murmur heard. Pulmonary/Chest: Effort normal and breath sounds normal. No respiratory distress. She has no wheezes. She has no rales.  Abdominal: Soft. Bowel sounds are normal. She exhibits no distension. There is no tenderness. There is no rebound and no guarding.  Musculoskeletal: She exhibits no edema.  Vitals reviewed.         Assessment & Plan:  Benign essential HTN - Plan: BASIC METABOLIC PANEL WITH GFR  Blood pressures  reported at home are excellent. Blood pressure here today is very elevated. I've asked the patient to check her blood pressure manually several times over the next week and report the values to me. If consistently elevated, I would augment her blood pressure medications to address. Her lab work is excellent her cancer screening is up-to-date.

## 2014-05-28 ENCOUNTER — Encounter: Payer: Self-pay | Admitting: Family Medicine

## 2015-01-05 DIAGNOSIS — Z9221 Personal history of antineoplastic chemotherapy: Secondary | ICD-10-CM

## 2015-01-05 HISTORY — DX: Personal history of antineoplastic chemotherapy: Z92.21

## 2015-01-09 ENCOUNTER — Encounter: Payer: Self-pay | Admitting: Family Medicine

## 2015-01-09 ENCOUNTER — Ambulatory Visit (INDEPENDENT_AMBULATORY_CARE_PROVIDER_SITE_OTHER): Payer: BC Managed Care – PPO | Admitting: Family Medicine

## 2015-01-09 VITALS — BP 132/88 | HR 64 | Temp 98.2°F | Resp 14 | Ht 64.0 in | Wt 133.0 lb

## 2015-01-09 DIAGNOSIS — R0989 Other specified symptoms and signs involving the circulatory and respiratory systems: Secondary | ICD-10-CM | POA: Diagnosis not present

## 2015-01-09 NOTE — Progress Notes (Signed)
Subjective:    Patient ID: Kim Ortega, female    DOB: Apr 01, 1955, 60 y.o.   MRN: [id number]  HPI   patient reports 3 weeks of pulsatile tinnitus in the right ear. She denies any headaches. She denies any vision changes. She denies any hearing loss. She denies any pain in the right ear. She does have a past medical history of smoking as well as hypertension. She also has a strong family history of stroke in her mother.  She denies any vertigo or hearing loss Past Medical History  Diagnosis Date  . Psoriasis   . Hypertension   . Hormone replacement therapy   . Allergy   . Smoker   . Episcleritis   . Anxiety     mild no regular meds  . Arthritis     psoriatic arthritis -back   Past Surgical History  Procedure Laterality Date  . Abdominal hysterectomy    . Cervical acdf      C5-6- fusion with plates/screws "Dr. Sherwood Gambler" '12  . Tonsillectomy      child  . Laporotomy      Ovarian cyst  . Colposcopy    . Colonoscopy with propofol N/A 04/16/2014    Procedure: COLONOSCOPY WITH PROPOFOL;  Surgeon: Garlan Fair, MD;  Location: [facility];  Service: Endoscopy;  Laterality: N/A;   Current Outpatient Prescriptions on File Prior to Visit  Medication Sig Dispense Refill  . ALPRAZolam (XANAX) 0.5 MG tablet Take 0.5 mg by mouth 3 (three) times daily as needed for anxiety.    [name] aspirin 81 MG tablet Take 81 mg by mouth daily.    . bisoprolol-hydrochlorothiazide (ZIAC) 10-6.25 MG per tablet Take 1 tablet by mouth daily. 30 tablet 11  . Calcium Carbonate-Vitamin D (CALCIUM-D PO) Take 1,200 mg by mouth 2 (two) times daily.    . cetirizine (ZYRTEC) 10 MG tablet Take 10 mg by mouth daily.    [name] desonide (DESOWEN) 0.05 % cream Apply 1 application topically daily. Psoriasis patches    . diclofenac sodium (VOLTAREN) 1 % GEL Apply 2 g topically 3 (three) times daily as needed (Pain).     [name] estradiol (ESTRACE) 2 MG tablet Take 2 mg by mouth daily.    . fluticasone (FLONASE) 50 MCG/ACT nasal  spray TAKE 2 SPRAY IN EACH NOSTRIL DAILY 16 g 11  . Ketotifen Fumarate (ALLERGY EYE DROPS OP) Apply 1 drop to eye 2 (two) times daily as needed (allegies).    . losartan (COZAAR) 50 MG tablet Take 1 tablet (50 mg total) by mouth daily. 30 tablet 11  . Multiple Vitamin (MULTIVITAMIN WITH MINERALS) TABS tablet Take 1 tablet by mouth daily.    . traMADol (ULTRAM) 50 MG tablet Take 1 tablet (50 mg total) by mouth every 6 (six) hours as needed. (Patient taking differently: Take 50 mg by mouth every 6 (six) hours as needed for moderate pain. ) 120 tablet 0   No current facility-administered medications on file prior to visit.   No Known Allergies Social History   Social History  . Marital Status: Married    Spouse Name: N/A  . Number of Children: N/A  . Years of Education: N/A   Occupational History  . Not on file.   Social History Main Topics  . Smoking status: Current Every Day Smoker -- 0.50 packs/day for 20 years    Types: Cigarettes  . Smokeless tobacco: Not on file  . Alcohol Use: Yes     Comment: Glasses  of wine several times a week  . Drug Use: No  . Sexual Activity: Yes     Comment: married   Other Topics Concern  . Not on file   Social History Narrative     Review of Systems  All other systems reviewed and are negative.      Objective:   Physical Exam  Constitutional: She appears well-developed and well-nourished.  HENT:  Right Ear: External ear normal.  Left Ear: External ear normal.  Nose: Nose normal.  Mouth/Throat: Oropharynx is clear and moist. No oropharyngeal exudate.  Neck: Neck supple.  Cardiovascular: Normal rate, regular rhythm and normal heart sounds.   Pulses:      Carotid pulses are on the right side with bruit, and on the left side with bruit. Pulmonary/Chest: Effort normal and breath sounds normal. No stridor.  Lymphadenopathy:    She has no cervical adenopathy.  Vitals reviewed.         Assessment & Plan:  Bilateral carotid  bruits   Patient has soft bilateral carotid bruits and no cardiac murmur. Therefore I will schedule the patient for carotid Dopplers to evaluate for internal carotid artery stenosis as a possible cause of her pulsatile tinnitus

## 2015-01-13 ENCOUNTER — Encounter: Payer: Self-pay | Admitting: *Deleted

## 2015-01-16 ENCOUNTER — Ambulatory Visit (HOSPITAL_COMMUNITY)
Admission: RE | Admit: 2015-01-16 | Discharge: 2015-01-16 | Disposition: A | Discharge status: Home / Self Care | Payer: BC Managed Care – PPO | Source: Ambulatory Visit | Attending: Family Medicine | Admitting: Family Medicine

## 2015-01-16 DIAGNOSIS — I6523 Occlusion and stenosis of bilateral carotid arteries: Secondary | ICD-10-CM | POA: Insufficient documentation

## 2015-01-16 DIAGNOSIS — R0989 Other specified symptoms and signs involving the circulatory and respiratory systems: Secondary | ICD-10-CM | POA: Diagnosis not present

## 2015-01-16 DIAGNOSIS — I10 Essential (primary) hypertension: Secondary | ICD-10-CM | POA: Insufficient documentation

## 2015-02-13 ENCOUNTER — Encounter: Payer: Self-pay | Admitting: Family Medicine

## 2015-03-26 ENCOUNTER — Encounter: Payer: Self-pay | Admitting: Physician Assistant

## 2015-03-26 ENCOUNTER — Encounter: Payer: Self-pay | Admitting: Family Medicine

## 2015-03-26 ENCOUNTER — Ambulatory Visit (INDEPENDENT_AMBULATORY_CARE_PROVIDER_SITE_OTHER): Payer: BC Managed Care – PPO | Admitting: Physician Assistant

## 2015-03-26 VITALS — BP 164/94 | HR 72 | Temp 98.4°F | Resp 18 | Wt 128.0 lb

## 2015-03-26 DIAGNOSIS — M545 Low back pain, unspecified: Secondary | ICD-10-CM

## 2015-03-26 MED ORDER — HYDROCODONE-ACETAMINOPHEN 5-325 MG PO TABS: 1.0000 | ORAL_TABLET | Freq: Four times a day (QID) | ORAL | Status: DC | PRN

## 2015-03-26 MED ORDER — PREDNISONE 20 MG PO TABS: ORAL_TABLET | ORAL | Status: DC

## 2015-03-26 NOTE — Progress Notes (Signed)
Patient ID: Kim Ortega MRN: [id number], DOB: 02/21/1955, 60 y.o. Date of Encounter: 03/26/2015, 8:39 AM    Chief Complaint:  Chief Complaint  Patient presents with  . severe lower back psain x 3 days    no known injury, pain across low back w/o radiation     HPI: 60 y.o. year old white female presents with above.   She states that she has been having muscle spasms along both sides of her low back--points to level of T12 - L1 bilaterally.  She is sitting up, stiff, moves her whole body to turn toward me.   She states that she never had any acute onset symptoms. Says that on Saturday 03/22/15 she did her usual chores around the house including a little yard work. Says that at that time she never had any back pain or problem. Says that when she went woke up Sunday morning, she "almost fell in the floor". Spasms started then.   States that she saw Dr. Estanislado Pandy last week for a routine visit to follow-up her arthritis. Says that she prescribed tizanidine 4 mg 1 daily at bedtime to use for general muscle aches--- says that she "carries a lot of tension in her neck"--- this was prescribed to use for this.  Since she did have this medication available, she has been taking this at night--- but it does not seem to be helping much.  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 Says that her job includes sitting, standing.  she has been going to work and has an Environmental consultant carry the charts etc. and she has sometimes been lying on exam table to relieve her back. She has been alternating heat and cold and has been taking Motrin and has even taken some tramadol but says that that does not touch the pain and needs something stronger for pain.  No radiation of the pain. Just on both sides of her back around T12-L1 level. No pain, numbness, tingling, weakness in either of her legs or feet.     Home Meds:   Outpatient Prescriptions Prior to Visit  Medication Sig Dispense Refill  .  ALPRAZolam (XANAX) 0.5 MG tablet Take 0.5 mg by mouth 3 (three) times daily as needed for anxiety.    [name] aspirin 81 MG tablet Take 81 mg by mouth daily.    . bisoprolol-hydrochlorothiazide (ZIAC) 10-6.25 MG per tablet Take 1 tablet by mouth daily. 30 tablet 11  . Calcium Carbonate-Vitamin D (CALCIUM-D PO) Take 1,200 mg by mouth 2 (two) times daily.    . cetirizine (ZYRTEC) 10 MG tablet Take 10 mg by mouth daily.    [name] desonide (DESOWEN) 0.05 % cream Apply 1 application topically daily. Psoriasis patches    . diclofenac sodium (VOLTAREN) 1 % GEL Apply 2 g topically 3 (three) times daily as needed (Pain).     [name] estradiol (ESTRACE) 2 MG tablet Take 2 mg by mouth daily.    . fluticasone (FLONASE) 50 MCG/ACT nasal spray TAKE 2 SPRAY IN EACH NOSTRIL DAILY 16 g 11  . Ketotifen Fumarate (ALLERGY EYE DROPS OP) Apply 1 drop to eye 2 (two) times daily as needed (allegies).    . losartan (COZAAR) 50 MG tablet Take 1 tablet (50 mg total) by mouth daily. 30 tablet 11  . Multiple Vitamin (MULTIVITAMIN WITH MINERALS) TABS tablet Take 1 tablet by mouth daily.    . traMADol (ULTRAM) 50 MG tablet Take 1 tablet (50 mg total) by mouth  every 6 (six) hours as needed. (Patient not taking: Reported on 03/26/2015) 120 tablet 0   No facility-administered medications prior to visit.    Allergies: No Known Allergies    Review of Systems: See HPI for pertinent ROS. All other ROS negative.    Physical Exam: Blood pressure 164/94, pulse 72, temperature 98.4 F (36.9 C), temperature source Oral, resp. rate 18, weight 128 lb (58.06 kg)., Body mass index is 21.96 kg/(m^2). General:  WNWD WF. Appears in no acute distress. Neck: Supple. No thyromegaly. No lymphadenopathy. Lungs: Clear bilaterally to auscultation without wheezes, rales, or rhonchi. Breathing is unlabored. Heart: Regular rhythm. No murmurs, rubs, or gallops. Msk:  Strength and tone normal for age. Back Pain at T12-L1 level bilaterally.  Extremities/Skin: Warm  and dry.  Neuro: Alert and oriented X 3. Moves all extremities spontaneously. Gait is normal. CNII-XII grossly in tact. Psych:  Responds to questions appropriately with a normal affect.     ASSESSMENT AND PLAN:  60 y.o. year old female with  1. Bilateral low back pain without sciatica She is to take the Tizanidine 3 times a day.  She is to take the prednisone taper as directed. She can use the hydrocodone as needed for breakthrough pain. We are writing her a note to be out of work today through Friday. She is to sit in a recliner or lie flat on her back such that she has full support to the muscles of her back and can rest those muscles. She is to apply heat. If this does not resolve in 1 week, she is to f/u. Discussed that if does not resolve, or re-occurs, then will obtain XRay - predniSONE (DELTASONE) 20 MG tablet; Take 3 daily for 2 days, then 2 daily for 2 days, then 1 daily for 2 days.  Dispense: 12 tablet; Refill: 0 - HYDROcodone-acetaminophen (NORCO/VICODIN) 5-325 MG tablet; Take 1 tablet by mouth every 6 (six) hours as needed.  Dispense: 30 tablet; Refill: 0   Signed, [address] [place], [place], [facility] 03/26/2015 8:39 AM

## 2015-05-02 ENCOUNTER — Other Ambulatory Visit: Payer: Self-pay | Admitting: Family Medicine

## 2015-05-02 NOTE — Telephone Encounter (Signed)
Refill appropriate and filled per protocol. 

## 2015-05-05 ENCOUNTER — Other Ambulatory Visit: Payer: Self-pay

## 2015-05-05 DIAGNOSIS — Z1231 Encounter for screening mammogram for malignant neoplasm of breast: Secondary | ICD-10-CM

## 2015-05-12 ENCOUNTER — Other Ambulatory Visit: Payer: Self-pay | Admitting: Family Medicine

## 2015-05-12 NOTE — Telephone Encounter (Signed)
Refill appropriate and filled per protocol. 

## 2015-06-11 ENCOUNTER — Ambulatory Visit
Admission: RE | Admit: 2015-06-11 | Discharge: 2015-06-11 | Disposition: A | Discharge status: Home / Self Care | Payer: BC Managed Care – PPO | Source: Ambulatory Visit

## 2015-06-11 DIAGNOSIS — Z1231 Encounter for screening mammogram for malignant neoplasm of breast: Secondary | ICD-10-CM

## 2015-06-17 ENCOUNTER — Other Ambulatory Visit: Payer: Self-pay | Admitting: Obstetrics & Gynecology

## 2015-06-17 DIAGNOSIS — R928 Other abnormal and inconclusive findings on diagnostic imaging of breast: Secondary | ICD-10-CM

## 2015-06-24 ENCOUNTER — Encounter: Payer: Self-pay | Admitting: Family Medicine

## 2015-06-24 ENCOUNTER — Other Ambulatory Visit: Payer: Self-pay | Admitting: Obstetrics & Gynecology

## 2015-06-24 ENCOUNTER — Ambulatory Visit
Admission: RE | Admit: 2015-06-24 | Discharge: 2015-06-24 | Disposition: A | Discharge status: Home / Self Care | Payer: BC Managed Care – PPO | Source: Ambulatory Visit | Attending: Obstetrics & Gynecology | Admitting: Obstetrics & Gynecology

## 2015-06-24 ENCOUNTER — Ambulatory Visit (INDEPENDENT_AMBULATORY_CARE_PROVIDER_SITE_OTHER): Payer: BC Managed Care – PPO | Admitting: Family Medicine

## 2015-06-24 VITALS — BP 132/78 | HR 76 | Temp 98.5°F | Resp 14 | Ht 64.0 in | Wt 130.0 lb

## 2015-06-24 DIAGNOSIS — R0989 Other specified symptoms and signs involving the circulatory and respiratory systems: Secondary | ICD-10-CM

## 2015-06-24 DIAGNOSIS — M5440 Lumbago with sciatica, unspecified side: Secondary | ICD-10-CM | POA: Diagnosis not present

## 2015-06-24 DIAGNOSIS — R928 Other abnormal and inconclusive findings on diagnostic imaging of breast: Secondary | ICD-10-CM

## 2015-06-24 DIAGNOSIS — I6522 Occlusion and stenosis of left carotid artery: Secondary | ICD-10-CM | POA: Insufficient documentation

## 2015-06-24 DIAGNOSIS — N6320 Unspecified lump in the left breast, unspecified quadrant: Secondary | ICD-10-CM

## 2015-06-24 MED ORDER — HYDROCODONE-ACETAMINOPHEN 5-325 MG PO TABS: 1.0000 | ORAL_TABLET | Freq: Four times a day (QID) | ORAL | Status: DC | PRN

## 2015-06-24 NOTE — Progress Notes (Signed)
Subjective:    Patient ID: Kim Ortega, female    DOB: 09/10/1955, 60 y.o.   MRN: [id number]  HPI  The patient reports more than 3 months of severe pain in her lower back. It now seems to be radiating into her left leg. She denies saddle anesthesia or cauda equina syndrome.  However the pain seems to be intensifying. She is tried and failed prednisone and muscle relaxers and has even seen a chiropractor for manipulation and a TENS unit without benefit. She is interested in other options.  Past Medical History  Diagnosis Date  . Psoriasis   . Hypertension   . Hormone replacement therapy   . Allergy   . Smoker   . Episcleritis   . Anxiety     mild no regular meds  . Arthritis     psoriatic arthritis -back  . Left carotid artery stenosis     40-59% (01/2015)   Past Surgical History  Procedure Laterality Date  . Abdominal hysterectomy    . Cervical acdf      C5-6- fusion with plates/screws "Dr. Sherwood Gambler" '12  . Tonsillectomy      child  . Laporotomy      Ovarian cyst  . Colposcopy    . Colonoscopy with propofol N/A 04/16/2014    Procedure: COLONOSCOPY WITH PROPOFOL;  Surgeon: Garlan Fair, MD;  Location: [facility];  Service: Endoscopy;  Laterality: N/A;   Current Outpatient Prescriptions on File Prior to Visit  Medication Sig Dispense Refill  . ALPRAZolam (XANAX) 0.5 MG tablet Take 0.5 mg by mouth 3 (three) times daily as needed for anxiety.    [name] aspirin 81 MG tablet Take 81 mg by mouth daily.    . bisoprolol-hydrochlorothiazide (ZIAC) 10-6.25 MG tablet TAKE 1 TABLET BY MOUTH DAILY. 90 tablet 3  . Calcium Carbonate-Vitamin D (CALCIUM-D PO) Take 1,200 mg by mouth 2 (two) times daily.    . cetirizine (ZYRTEC) 10 MG tablet Take 10 mg by mouth daily.    [name] desonide (DESOWEN) 0.05 % cream Apply 1 application topically daily. Psoriasis patches    . diclofenac sodium (VOLTAREN) 1 % GEL Apply 2 g topically 3 (three) times daily as needed (Pain).     [name] estradiol (ESTRACE) 2 MG  tablet Take 2 mg by mouth daily.    . fluticasone (FLONASE) 50 MCG/ACT nasal spray INHALE 2 SPRAYS IN EACH NOSTRIL DAILY 16 g 7  . HYDROcodone-acetaminophen (NORCO/VICODIN) 5-325 MG tablet Take 1 tablet by mouth every 6 (six) hours as needed. 30 tablet 0  . Ketotifen Fumarate (ALLERGY EYE DROPS OP) Apply 1 drop to eye 2 (two) times daily as needed (allegies).    . losartan (COZAAR) 50 MG tablet TAKE 1 TABLET BY MOUTH ONCE DAILY. 90 tablet 3  . Multiple Vitamin (MULTIVITAMIN WITH MINERALS) TABS tablet Take 1 tablet by mouth daily.    . predniSONE (DELTASONE) 20 MG tablet Take 3 daily for 2 days, then 2 daily for 2 days, then 1 daily for 2 days. 12 tablet 0  . tiZANidine (ZANAFLEX) 4 MG tablet Take 4 mg by mouth at bedtime.  2  . traMADol (ULTRAM) 50 MG tablet Take 1 tablet (50 mg total) by mouth every 6 (six) hours as needed. (Patient not taking: Reported on 03/26/2015) 120 tablet 0   No current facility-administered medications on file prior to visit.   No Known Allergies Social History   Social History  . Marital Status: Married    Spouse Name:  N/A  . Number of Children: N/A  . Years of Education: N/A   Occupational History  . Not on file.   Social History Main Topics  . Smoking status: Current Every Day Smoker -- 0.50 packs/day for 20 years    Types: Cigarettes  . Smokeless tobacco: Not on file  . Alcohol Use: Yes     Comment: Glasses of wine several times a week  . Drug Use: No  . Sexual Activity: Yes     Comment: married   Other Topics Concern  . Not on file   Social History Narrative     Review of Systems  All other systems reviewed and are negative.      Objective:   Physical Exam  Constitutional: She appears well-developed and well-nourished. No distress.  Cardiovascular: Normal rate, regular rhythm and normal heart sounds.   No murmur heard. Pulmonary/Chest: Effort normal and breath sounds normal. No respiratory distress. She has no wheezes. She has no rales.   Abdominal: Soft. Bowel sounds are normal.  Musculoskeletal:       Lumbar back: She exhibits decreased range of motion, tenderness and pain. She exhibits no bony tenderness and no deformity.  Neurological: She has normal reflexes. She displays normal reflexes. She exhibits normal muscle tone. Coordination normal.  Skin: She is not diaphoretic.  Vitals reviewed.         Assessment & Plan:  Midline low back pain with sciatica, sciatica laterality unspecified - Plan: HYDROcodone-acetaminophen (NORCO) 5-325 MG tablet  Schedule the patient for an MRI of the lumbar spine is a suspect that she has a herniated disc and may benefit from an epidural steroid injection. I recommended smoking cessation and discontinuation of estrogen given her left carotid stenosis. I will also schedule a carotid Doppler for January 2018.

## 2015-06-25 ENCOUNTER — Ambulatory Visit
Admission: RE | Admit: 2015-06-25 | Discharge: 2015-06-25 | Disposition: A | Discharge status: Home / Self Care | Payer: BC Managed Care – PPO | Source: Ambulatory Visit | Attending: Obstetrics & Gynecology | Admitting: Obstetrics & Gynecology

## 2015-06-25 DIAGNOSIS — N6320 Unspecified lump in the left breast, unspecified quadrant: Secondary | ICD-10-CM

## 2015-06-26 ENCOUNTER — Telehealth: Payer: Self-pay | Admitting: Family Medicine

## 2015-06-26 NOTE — Telephone Encounter (Signed)
Pt's biopsy has come back positive for breast cancer. She will be meeting with a surgeon to discuss treatment options. She does not wish to presue an MRI of her back at this time.

## 2015-06-27 NOTE — Telephone Encounter (Signed)
MRI canceled per pt request.

## 2015-07-02 ENCOUNTER — Encounter: Payer: Self-pay | Admitting: Hematology and Oncology

## 2015-07-02 ENCOUNTER — Telehealth: Payer: Self-pay | Admitting: Hematology and Oncology

## 2015-07-02 NOTE — Telephone Encounter (Signed)
Appointment with [facility] on 6/29 at 3:45pm. Patient was called to make her aware of appointment since she is out of town. Patient agreed. Aware to arrive 30 minutes early. Letter to referring.

## 2015-07-03 ENCOUNTER — Encounter: Payer: Self-pay | Admitting: Hematology and Oncology

## 2015-07-03 ENCOUNTER — Ambulatory Visit (HOSPITAL_BASED_OUTPATIENT_CLINIC_OR_DEPARTMENT_OTHER): Payer: BC Managed Care – PPO | Admitting: Hematology and Oncology

## 2015-07-03 VITALS — BP 120/74 | HR 68 | Temp 98.4°F | Resp 16 | Ht 64.0 in | Wt 127.2 lb

## 2015-07-03 DIAGNOSIS — C50312 Malignant neoplasm of lower-inner quadrant of left female breast: Secondary | ICD-10-CM

## 2015-07-03 DIAGNOSIS — Z170 Estrogen receptor positive status [ER+]: Secondary | ICD-10-CM | POA: Diagnosis not present

## 2015-07-03 MED ORDER — ANASTROZOLE 1 MG PO TABS
1.0000 mg | ORAL_TABLET | Freq: Every day | ORAL | Status: DC
Start: 2015-07-03 — End: 2016-03-29

## 2015-07-03 NOTE — Progress Notes (Signed)
[place] CONSULT NOTE  Patient Care Team: [name], MD as PCP - General (Family Medicine)  CHIEF COMPLAINTS/PURPOSE OF CONSULTATION:  Newly diagnosed breast cancer  HISTORY OF PRESENTING ILLNESS:  Kim Ortega 60 y.o. female is here because of recent diagnosis of left breast cancer. She had a routine screening mammogram detected an abnormality in the left breast at 8:30 position. 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 It was ER positive PR negative Ki-67 of 50% and HER-2 negative. She was present of the breast tumor board and the recommendation was to do Oncotype DX on the biopsy. This will help determine if she needs chemotherapy. She does need chemotherapy she received a neoadjuvant fashion. If she does not need chemotherapy then she will proceed with lumpectomy. 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  I reviewed her records extensively and collaborated the history with the patient.  SUMMARY OF ONCOLOGIC HISTORY:   Breast cancer of lower-inner quadrant of left female breast ([place])   06/25/2015 Initial Diagnosis Left breast biopsy 8:30 position: IDC grade 1, ER 90%, PR 0%, 50%, HER-2 Negative Ratio 1.21, Irregular Mass Left Breast 1 Cm from Nipple 2.5 x 2 x 2.5 Cm with Distortion, T2 N0 Stage II a Clinical Stage   MEDICAL HISTORY:  Past Medical History  Diagnosis Date  . Psoriasis   . Hypertension   . Hormone replacement therapy   . Allergy   . Smoker   . Episcleritis   . Anxiety     mild no regular meds  . Arthritis     psoriatic arthritis -back  . Left carotid artery stenosis     40-59% (01/2015)    SURGICAL HISTORY: Past Surgical History  Procedure Laterality Date  . Abdominal hysterectomy    . Cervical acdf      C5-6- fusion with plates/screws "Dr. Sherwood Gambler" '12  . Tonsillectomy      child  . Laporotomy      Ovarian cyst  .  Colposcopy    . Colonoscopy with propofol N/A 04/16/2014    Procedure: COLONOSCOPY WITH PROPOFOL;  Surgeon: Garlan Fair, MD;  Location: [facility];  Service: Endoscopy;  Laterality: N/A;    SOCIAL HISTORY: Social History   Social History  . Marital Status: Married    Spouse Name: N/A  . Number of Children: N/A  . Years of Education: N/A   Occupational History  . Not on file.   Social History Main Topics  . Smoking status: Current Every Day Smoker -- 0.50 packs/day for 20 years    Types: Cigarettes  . Smokeless tobacco: Not on file  . Alcohol Use: Yes     Comment: Glasses of wine several times a week  . Drug Use: No  . Sexual Activity: Yes     Comment: married   Other Topics Concern  . Not on file   Social History Narrative    FAMILY HISTORY: Family History  Problem Relation Age of Onset  . Stroke Mother     ALLERGIES:  has No Known Allergies.  MEDICATIONS:  Current Outpatient Prescriptions  Medication Sig Dispense Refill  . ALPRAZolam (XANAX) 0.5 MG tablet Take 0.5 mg by mouth 3 (three) times daily as needed for anxiety.    [name] anastrozole (ARIMIDEX) 1 MG tablet Take 1 tablet (1 mg total) by mouth daily. 30 tablet 0  . aspirin 81 MG tablet  Take 81 mg by mouth daily.    . bisoprolol-hydrochlorothiazide (ZIAC) 10-6.25 MG tablet TAKE 1 TABLET BY MOUTH DAILY. 90 tablet 3  . Calcium Carbonate-Vitamin D (CALCIUM-D PO) Take 1,200 mg by mouth 2 (two) times daily.    . cetirizine (ZYRTEC) 10 MG tablet Take 10 mg by mouth daily.    [name] desonide (DESOWEN) 0.05 % cream Apply 1 application topically daily. Psoriasis patches    . diclofenac sodium (VOLTAREN) 1 % GEL Apply 2 g topically 3 (three) times daily as needed (Pain).     . fluticasone (FLONASE) 50 MCG/ACT nasal spray INHALE 2 SPRAYS IN EACH NOSTRIL DAILY 16 g 7  . HYDROcodone-acetaminophen (NORCO/VICODIN) 5-325 MG tablet Take 1 tablet by mouth every 6 (six) hours as needed. (Patient not taking: Reported on 06/24/2015)  30 tablet 0  . Ketotifen Fumarate (ALLERGY EYE DROPS OP) Apply 1 drop to eye 2 (two) times daily as needed (allegies).    . losartan (COZAAR) 50 MG tablet TAKE 1 TABLET BY MOUTH ONCE DAILY. 90 tablet 3  . Multiple Vitamin (MULTIVITAMIN WITH MINERALS) TABS tablet Take 1 tablet by mouth daily.    [name] tiZANidine (ZANAFLEX) 4 MG tablet Take 4 mg by mouth at bedtime.  2  . traMADol (ULTRAM) 50 MG tablet Take 1 tablet (50 mg total) by mouth every 6 (six) hours as needed. 120 tablet 0   No current facility-administered medications for this visit.    REVIEW OF SYSTEMS:   Constitutional: Denies fevers, chills or abnormal night sweats Eyes: Denies blurriness of vision, double vision or watery eyes Ears, nose, mouth, throat, and face: Denies mucositis or sore throat Respiratory: Denies cough, dyspnea or wheezes Cardiovascular: Denies palpitation, chest discomfort or lower extremity swelling Gastrointestinal:  Denies nausea, heartburn or change in bowel habits Skin: Denies abnormal skin rashes Lymphatics: Denies new lymphadenopathy or easy bruising Neurological:Denies numbness, tingling or new weaknesses Behavioral/Psych: Mood is stable, no new changes  Breast:  Denies any palpable lumps or discharge All other systems were reviewed with the patient and are negative.  PHYSICAL EXAMINATION: ECOG PERFORMANCE STATUS: 0 - Asymptomatic  Filed Vitals:   07/03/15 1548  BP: 120/74  Pulse: 68  Temp: 98.4 F (36.9 C)  Resp: 16   Filed Weights   07/03/15 1548  Weight: 127 lb 3.2 oz (57.698 kg)    GENERAL:alert, no distress and comfortable SKIN: skin color, texture, turgor are normal, no rashes or significant lesions EYES: normal, conjunctiva are pink and non-injected, sclera clear OROPHARYNX:no exudate, no erythema and lips, buccal mucosa, and tongue normal  NECK: supple, thyroid normal size, non-tender, without nodularity LYMPH:  no palpable lymphadenopathy in the cervical, axillary or  inguinal LUNGS: clear to auscultation and percussion with normal breathing effort HEART: regular rate & rhythm and no murmurs and no lower extremity edema ABDOMEN:abdomen soft, non-tender and normal bowel sounds Musculoskeletal:no cyanosis of digits and no clubbing  PSYCH: alert & oriented x 3 with fluent speech NEURO: no focal motor/sensory deficits BREAST: No palpable nodules in breast. No palpable axillary or supraclavicular lymphadenopathy (exam performed in the presence of a chaperone)   LABORATORY DATA:  I have reviewed the data as listed Lab Results  Component Value Date   WBC 9.3 02/01/2014   HGB 13.2 02/01/2014   HCT 39.5 02/01/2014   MCV 98.5 02/01/2014   PLT 247 02/01/2014   Lab Results  Component Value Date   NA 138 05/10/2014   K 4.6 05/10/2014   CL 99 05/10/2014  CO2 24 05/10/2014    RADIOGRAPHIC STUDIES: I have personally reviewed the radiological reports and agreed with the findings in the report.  ASSESSMENT AND PLAN:  Breast cancer of lower-inner quadrant of left female breast ([place]) Left breast biopsy 8:30 position: IDC grade 1, ER 90%, PR 0%, 50%, HER-2 Negative Ratio 1.21, Irregular Mass Left Breast 1 Cm from Nipple 2.5 x 2 x 2.5 Cm with Distortion,  T2 N0 Stage II a Clinical Stage  Pathology and radiology counseling:Discussed with the patient, the details of pathology including the type of breast cancer,the clinical staging, the significance of ER, PR and HER-2/neu receptors and the implications for treatment. After reviewing the pathology in detail, we proceeded to discuss the different treatment options between surgery, radiation, chemotherapy, antiestrogen therapies.  Recommendations: 1. Oncotype DX testing to determine if chemotherapy would be of any benefit followed by 2. Breast conserving surgery followed by 3. Adjuvant radiation therapy followed by 4. Adjuvant antiestrogen therapy  Oncotype counseling: I discussed Oncotype DX test. I explained  to the patient that this is a 21 gene panel to evaluate patient tumors DNA to calculate recurrence score. This would help determine whether patient has high risk or intermediate risk or low risk breast cancer. She understands that if her tumor was found to be high risk, she would benefit from systemic chemotherapy. If low risk, no need of chemotherapy. If she was found to be intermediate risk, we would need to evaluate the score as well as other risk factors and determine if an abbreviated chemotherapy may be of benefit.  There is insufficient tissue, patient is willing to get another biopsy for Oncotype DX testing. Patient is very nervous about waiting for the test results. I recommended that she start anastrozole until we get the results of the Oncotype test. I discussed the risks and benefits of anastrozole including risk of hot flashes, myalgias, osteoporosis.  Return to clinic if she gets high intermediate or high-risk Oncotype DX to talk about chemotherapy options. If she does not need chemotherapy, she will proceed directly with lumpectomy. I will call her with the result of Oncotype DX test.  All questions were answered. The patient knows to call the clinic with any problems, questions or concerns.    Rulon Eisenmenger, MD 07/03/2015

## 2015-07-03 NOTE — Assessment & Plan Note (Signed)
Left breast biopsy 8:30 position: IDC grade 1, ER 90%, PR 0%, 50%, HER-2 Negative Ratio 1.21, Irregular Mass Left Breast 1 Cm from Nipple 2.5 x 2 x 2.5 Cm with Distortion,  T2 N0 Stage II a Clinical Stage  Pathology and radiology counseling:Discussed with the patient, the details of pathology including the type of breast cancer,the clinical staging, the significance of ER, PR and HER-2/neu receptors and the implications for treatment. After reviewing the pathology in detail, we proceeded to discuss the different treatment options between surgery, radiation, chemotherapy, antiestrogen therapies.  Recommendations: 1. Breast conserving surgery followed by 2. Oncotype DX testing to determine if chemotherapy would be of any benefit followed by 3. Adjuvant radiation therapy followed by 4. Adjuvant antiestrogen therapy  Oncotype counseling: I discussed Oncotype DX test. I explained to the patient that this is a 21 gene panel to evaluate patient tumors DNA to calculate recurrence score. This would help determine whether patient has high risk or intermediate risk or low risk breast cancer. She understands that if her tumor was found to be high risk, she would benefit from systemic chemotherapy. If low risk, no need of chemotherapy. If she was found to be intermediate risk, we would need to evaluate the score as well as other risk factors and determine if an abbreviated chemotherapy may be of benefit.  Return to clinic after surgery to discuss final pathology report and then determine if Oncotype DX testing will need to be sent.

## 2015-07-04 ENCOUNTER — Encounter: Payer: Self-pay | Admitting: *Deleted

## 2015-07-04 ENCOUNTER — Telehealth: Payer: Self-pay | Admitting: *Deleted

## 2015-07-04 NOTE — Telephone Encounter (Signed)
Ordered Oncotype Dx test per Dr. Lindi Adie.  Faxed order to [facility].  Faxed order to [facility] and called Tammy to verify she received it.  Faxed order to [facility].  Placed a note to look for results.

## 2015-07-07 ENCOUNTER — Other Ambulatory Visit: Payer: Self-pay | Admitting: Family Medicine

## 2015-07-07 ENCOUNTER — Ambulatory Visit: Payer: BC Managed Care – PPO | Admitting: Family Medicine

## 2015-07-07 DIAGNOSIS — M545 Low back pain, unspecified: Secondary | ICD-10-CM

## 2015-07-07 MED ORDER — HYDROCODONE-ACETAMINOPHEN 5-325 MG PO TABS: 1.0000 | ORAL_TABLET | Freq: Four times a day (QID) | ORAL | Status: DC | PRN

## 2015-07-10 ENCOUNTER — Encounter: Payer: Self-pay | Admitting: Radiation Oncology

## 2015-07-10 NOTE — Progress Notes (Signed)
Location of Breast Cancer: Left Breast 8:30  o'clock position Lower inner Quadrant  Histology per Pathology Report: Diagnosis 6/21/217 :  Breast, left, needle core biopsy, 8:30 o'clock INVASIVE DUCTAL CARCINOMA, GRADE 1FIBROCYSTIC CHANGES WITH CALCIFICATION Receptor Status: ER(90%+), PR (0% neg), Her2-neu (neg rati0 1.21), Ki-(67 50%)  Did patient present with symptoms (if so, please note symptoms) or was this found on screening mammography?: Routine screening  Past/Anticipated interventions by surgeon, if any: Dr. Darleen Crocker  Past/Anticipated interventions by medical oncology, if any: Chemotherapy : dr. Selinda Flavin Oncotype  DX , ,patient to get another bx,due to insufficient tissue sample , to start  ,Arimidex until new bx results   Lymphedema issues, if any:  NO Pain issues, if any:  No  SAFETY ISSUES: NO  Prior radiation? NO  Pacemaker/ICD? NO  Possible current pregnancy? NO  Is the patient on methotrexate? 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

## 2015-07-11 ENCOUNTER — Telehealth: Payer: Self-pay | Admitting: *Deleted

## 2015-07-11 NOTE — Telephone Encounter (Signed)
Sceduled VAS US CAROTID for 07/15/15 at 2:00.  Called to notify pt, Pt was frustrated, stated that she was not told during her visit that it was time for this, Pt states that she does not want to have this done at this time.  Called [facility] lab, [phone], to cancel but didn't get an answer.

## 2015-07-11 NOTE — Telephone Encounter (Signed)
Ordered prior to learning about her breast cancer as a routine follow up by me, it could be deferred for now.

## 2015-07-14 ENCOUNTER — Encounter: Payer: Self-pay | Admitting: Radiation Oncology

## 2015-07-14 ENCOUNTER — Other Ambulatory Visit: Payer: Self-pay | Admitting: General Surgery

## 2015-07-14 ENCOUNTER — Ambulatory Visit
Admission: RE | Admit: 2015-07-14 | Discharge: 2015-07-14 | Disposition: A | Discharge status: Home / Self Care | Payer: BC Managed Care – PPO | Source: Ambulatory Visit | Attending: Radiation Oncology | Admitting: Radiation Oncology

## 2015-07-14 VITALS — BP 141/70 | HR 72 | Temp 97.7°F | Resp 20 | Ht 64.0 in | Wt 125.8 lb

## 2015-07-14 DIAGNOSIS — F17210 Nicotine dependence, cigarettes, uncomplicated: Secondary | ICD-10-CM | POA: Insufficient documentation

## 2015-07-14 DIAGNOSIS — C50312 Malignant neoplasm of lower-inner quadrant of left female breast: Secondary | ICD-10-CM

## 2015-07-14 HISTORY — DX: Malignant neoplasm of unspecified site of unspecified female breast: C50.919

## 2015-07-14 NOTE — Progress Notes (Signed)
Radiation Oncology         [phone] ________________________________  Name: Kim Ortega MRN: [id number]  Date: 07/14/2015  DOB: 07-30-1955  DQ:[id number],XQJJHE TOM, MD  Rolm Bookbinder, MD     REFERRING PHYSICIAN: Rolm Bookbinder, MD   DIAGNOSIS: The encounter diagnosis was Breast cancer of lower-inner quadrant of left female breast [facility]).   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 female who had a routine screening mammogram, on 06/11/15, that detected a possible mass in the left breast. Diagnostic left breast mammogram and ultrasound on 06/24/15 showed a 2.5 x 2 x 2.5 cm irregular hypoechoic mass in the 8:30 o'clock position. No left axilla lymphadenopathy was noted.  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  She was present at the breast tumor board and the recommendation was to do Oncotype DX on the biopsy. This will help determine if she needs chemotherapy prior to breast conservation surgery to shrink the mass. However, there is insufficient tissue for the Oncotype test. Therefore, the patient will need another biopsy. The patient has been placed on Arimidex in the interim. The patient presents today with her husband to discuss the role of radiation in the management of her disease.  PREVIOUS RADIATION THERAPY: No   PAST MEDICAL HISTORY:  Past Medical History  Diagnosis Date  . Psoriasis   . Hypertension   . Hormone replacement therapy   . Allergy   . Smoker   . Episcleritis   . Anxiety     mild no regular meds  . Arthritis     psoriatic arthritis -back  . Left carotid artery stenosis     40-59% (01/2015)  . Breast cancer ([place]) 06/25/15 bx    left breastinvasive ductal ca ,grade 1       PAST SURGICAL  HISTORY: Past Surgical History  Procedure Laterality Date  . Abdominal hysterectomy    . Cervical acdf      C5-6- fusion with plates/screws "Dr. Sherwood Gambler" '12  . Tonsillectomy      child  . Laporotomy      Ovarian cyst  . Colposcopy    . Colonoscopy with propofol N/A 04/16/2014    Procedure: COLONOSCOPY WITH PROPOFOL;  Surgeon: Garlan Fair, MD;  Location: [facility];  Service: Endoscopy;  Laterality: N/A;     FAMILY HISTORY:  Family History  Problem Relation Age of Onset  . Stroke Mother      SOCIAL HISTORY:  reports that she has been smoking Cigarettes.  She has a 10 pack-year smoking history. She does not have any smokeless tobacco history on file. She reports that she drinks alcohol. She reports that she does not use illicit drugs.   ALLERGIES: Review of patient's allergies indicates no known allergies.   MEDICATIONS:  Current Outpatient Prescriptions  Medication Sig Dispense Refill  . ALPRAZolam (XANAX) 0.5 MG tablet Take 0.5 mg by mouth 3 (three) times daily as needed for anxiety.    [name] anastrozole (ARIMIDEX) 1 MG tablet Take 1 tablet (1 mg total) by mouth daily. 30 tablet 0  . aspirin 81 MG tablet Take 81 mg by mouth daily.    . bisoprolol-hydrochlorothiazide (ZIAC) 10-6.25 MG tablet TAKE 1 TABLET BY MOUTH DAILY. 90 tablet 3  . Calcium Carbonate-Vitamin D (CALCIUM-D PO) Take 1,200  mg by mouth 2 (two) times daily.    . cetirizine (ZYRTEC) 10 MG tablet Take 10 mg by mouth daily.    [name] desonide (DESOWEN) 0.05 % cream Apply 1 application topically daily. Psoriasis patches    . diclofenac sodium (VOLTAREN) 1 % GEL Apply 2 g topically 3 (three) times daily as needed (Pain).     [name] HYDROcodone-acetaminophen (NORCO/VICODIN) 5-325 MG tablet Take 1-2 tablets by mouth every 6 (six) hours as needed. 120 tablet 0  . Ketotifen Fumarate (ALLERGY EYE DROPS OP) Apply 1 drop to eye 2 (two) times daily as needed (allegies).    . losartan (COZAAR) 50 MG tablet TAKE 1 TABLET BY MOUTH  ONCE DAILY. 90 tablet 3  . Multiple Vitamin (MULTIVITAMIN WITH MINERALS) TABS tablet Take 1 tablet by mouth daily.    [name] tiZANidine (ZANAFLEX) 4 MG tablet Take 4 mg by mouth at bedtime.  2  . fluticasone (FLONASE) 50 MCG/ACT nasal spray INHALE 2 SPRAYS IN EACH NOSTRIL DAILY 16 g 7   No current facility-administered medications for this encounter.     REVIEW OF SYSTEMS: On review of systems, the patient reports that she is doing well overall. She denies any chest pain, shortness of breath, cough, fevers, chills, night sweats, unintended weight changes. She denies any bowel or bladder disturbances, and denies abdominal pain, nausea or vomiting. She denies any new musculoskeletal or joint aches or pains. A complete review of systems is obtained and is otherwise negative.   PHYSICAL EXAM:  height is _0  (1.626 m) and weight is 125 lb 12.8 oz (57.063 kg). Her oral temperature is 97.7 F (36.5 C). Her blood pressure is 141/70 and her pulse is 72. Her respiration is 20.   Pain scale 0/10 In general this is a well appearing Caucasian female in no acute distress. She is alert and oriented x4 and appropriate throughout the examination. HEENT reveals that the patient is normocephalic, atraumatic. EOMs are intact. PERRLA. Skin is intact without any evidence of gross lesions. Cardiovascular exam reveals a regular rate and rhythm, no clicks rubs or murmurs are auscultated. Chest is clear to auscultation bilaterally. Breast Exam: Bruising in the inner aspect of the left breast with underlying biopsy change. No other lesions or axillary adenopathy. Negative right breast exam and exam of the right axilla. Lymphatic assessment is performed and does not reveal any adenopathy in the cervical, supraclavicular, axillary, or inguinal chains. Abdomen has active bowel sounds in all quadrants and is intact. The abdomen is soft, non tender, non distended. Lower extremities are negative for pretibial pitting edema, deep calf  tenderness, cyanosis or clubbing.  ECOG = 1  0 - Asymptomatic (Fully active, able to carry on all predisease activities without restriction)  1 - Symptomatic but completely ambulatory (Restricted in physically strenuous activity but ambulatory and able to carry out work of a light or sedentary nature. For example, light housework, office work)  2 - Symptomatic, <50% in bed during the day (Ambulatory and capable of all self care but unable to carry out any work activities. Up and about more than 50% of waking hours)  3 - Symptomatic, >50% in bed, but not bedbound (Capable of only limited self-care, confined to bed or chair 50% or more of waking hours)  4 - Bedbound (Completely disabled. Cannot carry on any self-care. Totally confined to bed or chair)  5 - Death   Eustace Pen MM, Creech RH, Tormey DC, et al. [phone]). "Toxicity and response criteria of the [place]  Cooperative Oncology Group". [place] Oncol. 5 (6): 649-55    LABORATORY DATA:  Lab Results  Component Value Date   WBC 9.3 02/01/2014   HGB 13.2 02/01/2014   HCT 39.5 02/01/2014   MCV 98.5 02/01/2014   PLT 247 02/01/2014   Lab Results  Component Value Date   NA 138 05/10/2014   K 4.6 05/10/2014   CL 99 05/10/2014   CO2 24 05/10/2014   Lab Results  Component Value Date   ALT 15 02/01/2014   AST 16 02/01/2014   ALKPHOS 40 02/01/2014   BILITOT 0.5 02/01/2014      RADIOGRAPHY: Mm Digital Diagnostic Unilat L  06/25/2015  CLINICAL DATA:  Status post ultrasound-guided core biopsy left breast mass EXAM: DIAGNOSTIC LEFT MAMMOGRAM POST ULTRASOUND BIOPSY COMPARISON:  Previous exam(s). FINDINGS: Mammographic images were obtained following left breast ultrasound guided biopsy of mass at 8:30 o'clock. Cc and lateral views of the left breast demonstrate ribbon biopsy clip in the mass of concern. IMPRESSION: Post biopsy mammogram demonstrating biopsy clip in the mass of concern. Final Assessment: Post Procedure Mammograms for Marker  Placement Electronically Signed   By: Abelardo Diesel M.D.   On: 06/25/2015 15:22   US Breast Ltd Uni Left Inc Axilla  06/24/2015  ADDENDUM REPORT: 06/24/2015 16:50 ADDENDUM: Left axilla was evaluated with ultrasound today showing no enlarged or morphologically abnormal lymph nodes. Electronically Signed   By: Franki Cabot M.D.   On: 06/24/2015 16:50  06/24/2015  CLINICAL DATA:  Patient returns today to evaluate a possible mass within the left breast. EXAM: 2D DIGITAL DIAGNOSTIC LEFT MAMMOGRAM WITH CAD AND ADJUNCT TOMO ULTRASOUND LEFT BREAST COMPARISON:  Previous exams including recent screening mammogram dated 06/11/2015 ACR Breast Density Category c: The breast tissue is heterogeneously dense, which may obscure small masses. FINDINGS: On today's additional views of the left breast with spot compression and 3D tomosynthesis, an irregular mass with associated architectural distortion is confirmed within the slightly inner left breast, at anterior to middle depth, with the mass measuring approximately 2.8 cm greatest dimension. No other suspicious masses are identified within the left breast. Mammographic images were processed with CAD. Targeted ultrasound is performed, showing an irregular hypoechoic mass within the left breast at the 8:30 o'clock axis, 1 cm from the nipple, measuring 2.5 x 2 x 2.5 cm, with internal vascularity, corresponding to the mammographic finding. IMPRESSION: Irregular mass within the left breast at the 8:30 o'clock axis, 1 cm from the nipple, measuring 2.5 x 2 x 2.5 cm, with associated architectural distortion, corresponding to the mammographic finding. This is a highly suspicious mass for which ultrasound-guided biopsy is recommended. RECOMMENDATION: Ultrasound-guided core biopsy of the highly suspicious left breast mass with associated architectural distortion. Ultrasound-guided core biopsy is scheduled for 06/25/2015 at 2:30 p.m. I have discussed the findings and recommendations with  the patient. Results were also provided in writing at the conclusion of the visit. If applicable, a reminder letter will be sent to the patient regarding the next appointment. BI-RADS CATEGORY  5: Highly suggestive of malignancy. Electronically Signed: By: Franki Cabot M.D. On: 06/24/2015 15:42   Mm Diag Breast Tomo Uni Left  06/24/2015  ADDENDUM REPORT: 06/24/2015 16:50 ADDENDUM: Left axilla was evaluated with ultrasound today showing no enlarged or morphologically abnormal lymph nodes. Electronically Signed   By: Franki Cabot M.D.   On: 06/24/2015 16:50  06/24/2015  CLINICAL DATA:  Patient returns today to evaluate a possible mass within the left breast. EXAM: 2D  DIGITAL DIAGNOSTIC LEFT MAMMOGRAM WITH CAD AND ADJUNCT TOMO ULTRASOUND LEFT BREAST COMPARISON:  Previous exams including recent screening mammogram dated 06/11/2015 ACR Breast Density Category c: The breast tissue is heterogeneously dense, which may obscure small masses. FINDINGS: On today's additional views of the left breast with spot compression and 3D tomosynthesis, an irregular mass with associated architectural distortion is confirmed within the slightly inner left breast, at anterior to middle depth, with the mass measuring approximately 2.8 cm greatest dimension. No other suspicious masses are identified within the left breast. Mammographic images were processed with CAD. Targeted ultrasound is performed, showing an irregular hypoechoic mass within the left breast at the 8:30 o'clock axis, 1 cm from the nipple, measuring 2.5 x 2 x 2.5 cm, with internal vascularity, corresponding to the mammographic finding. IMPRESSION: Irregular mass within the left breast at the 8:30 o'clock axis, 1 cm from the nipple, measuring 2.5 x 2 x 2.5 cm, with associated architectural distortion, corresponding to the mammographic finding. This is a highly suspicious mass for which ultrasound-guided biopsy is recommended. RECOMMENDATION: Ultrasound-guided core biopsy  of the highly suspicious left breast mass with associated architectural distortion. Ultrasound-guided core biopsy is scheduled for 06/25/2015 at 2:30 p.m. I have discussed the findings and recommendations with the patient. Results were also provided in writing at the conclusion of the visit. If applicable, a reminder letter will be sent to the patient regarding the next appointment. BI-RADS CATEGORY  5: Highly suggestive of malignancy. Electronically Signed: By: Franki Cabot M.D. On: 06/24/2015 15:42   [place] Lt Breast Bx W Loc Dev 1st Lesion Img Bx Spec US Guide  06/27/2015  ADDENDUM REPORT: 06/27/2015 10:30 ADDENDUM: Pathology revealed GRADE I INVASIVE DUCTAL CARCINOMA, FIBROCYSTIC CHANGES WITH CALCIFICATION of the Left breast at the 8:30 o'clock location. This was found to be concordant by Dr. Abelardo Diesel. Pathology results were discussed with the patient by telephone. The patient reported doing well after the biopsy with tenderness at the site. Post biopsy instructions and care were reviewed and questions were answered. The patient was encouraged to call [facility] for any additional concerns. Surgical consultation has been arranged with Dr. Rolm Bookbinder at [facility] on August 08, 2015. Pathology results reported by Terie Purser, RN on 06/27/2015. Electronically Signed   By: Abelardo Diesel M.D.   On: 06/27/2015 10:30  06/27/2015  CLINICAL DATA:  Suspicious mass left breast for biopsy EXAM: ULTRASOUND GUIDED LEFT BREAST CORE NEEDLE BIOPSY COMPARISON:  Previous exam(s). FINDINGS: I met with the patient and we discussed the procedure of ultrasound-guided biopsy, including benefits and alternatives. We discussed the high likelihood of a successful procedure. We discussed the risks of the procedure, including infection, bleeding, tissue injury, clip migration, and inadequate sampling. Informed written consent was given. The usual time-out protocol was performed  immediately prior to the procedure. Using sterile technique and 1% Lidocaine as local anesthetic, under direct ultrasound visualization, a 11 gauge spring-loaded device was used to perform biopsy of spiculated mass at left breast 8:30 o'clock using a medial approach. At the conclusion of the procedure a ribbon tissue marker clip was deployed into the biopsy cavity. Follow up 2 view mammogram was performed and dictated separately. IMPRESSION: Ultrasound guided biopsy of left breast.  No apparent complications. Electronically Signed: By: Abelardo Diesel M.D. 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 If the patient is at high risk, it has been discussed with her that she may benefit from preoperative chemotherapy. She has gone ahead and begun anti-hormonal treatment currently while this is clarified.  I spoke to the patient today regarding her diagnosis and options for treatment. We discussed the equivalence in terms of survival and local failure between mastectomy and breast conservation. We discussed the role of radiation in decreasing local failures in patients who undergo lumpectomy. We discussed the process of CT simulation and the placement tattoos. We discussed 4-6 weeks of treatment as an outpatient. We discussed the possibility of asymptomatic lung damage. We discussed the low likelihood of secondary malignancies. We discussed the possible side effects including but not limited to skin redness, fatigue, permanent skin darkening, and breast swelling.  In terms of the length of treatment, I believe she is right on the border of where I would treat 4 versus 6-1/2 weeks. We can discuss this further once we see her back after surgery.  PLAN: I will see her back approximately 2-3 weeks after surgery. I did clarify with her that if she needed  chemotherapy this would be performed prior to radiation. In the interim, she will continue Arimidex and undergo another biopsy for Oncotype DX testing.  ------------------------------------------------  Jodelle Gross, MD, PhD  This document serves as a record of services personally performed by Kyung Rudd, MD. It was created on his behalf by Darcus Austin, a trained medical scribe. The creation of this record is based on the scribe's personal observations and the provider's statements to them. This document has been checked and approved by the attending provider.

## 2015-07-14 NOTE — Telephone Encounter (Signed)
appt cancled

## 2015-07-14 NOTE — Progress Notes (Signed)
Please see the Nurse Progress Note in the MD Initial Consult Encounter for this patient. 

## 2015-07-15 ENCOUNTER — Encounter (HOSPITAL_COMMUNITY): Payer: BC Managed Care – PPO

## 2015-07-17 ENCOUNTER — Other Ambulatory Visit: Payer: Self-pay | Admitting: *Deleted

## 2015-07-17 ENCOUNTER — Encounter: Payer: Self-pay | Admitting: *Deleted

## 2015-07-17 ENCOUNTER — Encounter (HOSPITAL_COMMUNITY): Payer: Self-pay

## 2015-07-17 ENCOUNTER — Telehealth: Payer: Self-pay | Admitting: Hematology and Oncology

## 2015-07-17 ENCOUNTER — Ambulatory Visit (HOSPITAL_BASED_OUTPATIENT_CLINIC_OR_DEPARTMENT_OTHER): Payer: BC Managed Care – PPO | Admitting: Hematology and Oncology

## 2015-07-17 ENCOUNTER — Other Ambulatory Visit: Payer: Self-pay | Admitting: Hematology and Oncology

## 2015-07-17 ENCOUNTER — Telehealth: Payer: Self-pay | Admitting: *Deleted

## 2015-07-17 ENCOUNTER — Encounter: Payer: Self-pay | Admitting: Hematology and Oncology

## 2015-07-17 VITALS — BP 140/77 | HR 72 | Temp 98.3°F | Resp 17 | Ht 64.0 in | Wt 128.0 lb

## 2015-07-17 DIAGNOSIS — C50312 Malignant neoplasm of lower-inner quadrant of left female breast: Secondary | ICD-10-CM

## 2015-07-17 NOTE — Assessment & Plan Note (Signed)
Left breast biopsy 8:30 position: IDC grade 1, ER 90%, PR 0%, 50%, HER-2 Negative Ratio 1.21, Irregular Mass Left Breast 1 Cm from Nipple 2.5 x 2 x 2.5 Cm with Distortion,  T2 N0 Stage II a Clinical Stage Oncotype DX score 52, 34% risk of recurrence without chemotherapy  Oncotype DX counseling: I discussed the result of the tests and went over the risk of recurrence data. I believe that the risk of recurrence is at the 34% without chemotherapy. With chemotherapy. Risk of recurrence can be reduced at 12-14%.  Recommendation: 1. Neoadjuvant chemotherapy with dose dense Adriamycin and Cytoxan 4 followed by Abraxane weekly 12 2. Followed by surgery followed by 3. Adjuvant radiation therapy followed by 4. Adjuvant anastrozole 1 mg daily 5-10 years.  Chemotherapy Counseling: I discussed the risks and benefits of chemotherapy including the risks of nausea/ vomiting, risk of infection from low WBC count, fatigue due to chemo or anemia, bruising or bleeding due to low platelets, mouth sores, loss/ change in taste and decreased appetite. Liver and kidney function will be monitored through out chemotherapy as abnormalities in liver and kidney function may be a side effect of treatment. Cardiac dysfunction due to Adriamycin was discussed in detail. Risk of permanent bone marrow dysfunction and leukemia due to chemo were also discussed.  Plan: 1. ECHO with Cardiology consult 2. Chemotherapy class 3. Start chemotherapy in 1-2 weeks 4. Labs 1 week from starting chemotherapy and follow-up with M.D. 5. Breast MRI with and without contrast (prior to starting chemotherapy)  Return to clinic with the start of chemotherapy.

## 2015-07-17 NOTE — Telephone Encounter (Signed)
Received Oncotype Dx score of 52.  Placed a copy in Dr. Geralyn Flash box.  Kim Ortega a copy and took a copy to HIM to scan.

## 2015-07-17 NOTE — Telephone Encounter (Signed)
appt made and avs printed °

## 2015-07-17 NOTE — Telephone Encounter (Signed)
Spoke with patient and confirmed appointment with Dr. Lindi Adie for 07/17/15 at 11145am to discuss oncotype results.

## 2015-07-17 NOTE — Progress Notes (Signed)
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 She had Oncotype DX testing the biopsy which confirmed that she has high risk of recurrence with the breast cancer. She had a score of 52 which translated into at least a 34% risk of recurrence. She is being seen today to discuss a treatment plan. She is accompanied by her husband.  REVIEW OF SYSTEMS:   Constitutional: Denies fevers, chills or abnormal weight loss Eyes: Denies blurriness of vision Ears, nose, mouth, throat, and face: Denies mucositis or sore throat Respiratory: Denies cough, dyspnea or wheezes Cardiovascular: Denies palpitation, chest discomfort Gastrointestinal:  Denies nausea, heartburn or change in bowel habits Skin: Denies abnormal skin rashes Lymphatics: Denies new lymphadenopathy or easy bruising Neurological:Denies numbness, tingling or new weaknesses Behavioral/Psych: Mood is stable, no new changes  Extremities: No lower extremity edema All other systems were reviewed with the patient and are negative.  I have reviewed the past medical history, past surgical history, social history and family history with the patient and they are unchanged from previous note.  ALLERGIES:  has No Known Allergies.  MEDICATIONS:  Current Outpatient Prescriptions  Medication Sig Dispense Refill  . ALPRAZolam  (XANAX) 0.5 MG tablet Take 0.5 mg by mouth 3 (three) times daily as needed for anxiety.    [name] anastrozole (ARIMIDEX) 1 MG tablet Take 1 tablet (1 mg total) by mouth daily. 30 tablet 0  . aspirin 81 MG tablet Take 81 mg by mouth daily.    . bisoprolol-hydrochlorothiazide (ZIAC) 10-6.25 MG tablet TAKE 1 TABLET BY MOUTH DAILY. 90 tablet 3  . Calcium Carbonate-Vitamin D (CALCIUM-D PO) Take 1,200 mg by mouth 2 (two) times daily.    . cetirizine (ZYRTEC) 10 MG tablet Take 10 mg by mouth daily.    [name] desonide (DESOWEN) 0.05 % cream Apply 1 application topically daily. Psoriasis patches    . diclofenac sodium (VOLTAREN) 1 % GEL Apply 2 g topically 3 (three) times daily as needed (Pain).     . fluticasone (FLONASE) 50 MCG/ACT nasal spray INHALE 2 SPRAYS IN EACH NOSTRIL DAILY 16 g 7  . HYDROcodone-acetaminophen (NORCO/VICODIN) 5-325 MG tablet Take 1-2 tablets by mouth every 6 (six) hours as needed. 120 tablet 0  . Ketotifen Fumarate (ALLERGY EYE DROPS OP) Apply 1 drop to eye 2 (two) times daily as needed (allegies).    . losartan (COZAAR) 50 MG tablet TAKE 1 TABLET BY MOUTH ONCE DAILY. 90 tablet 3  . Multiple Vitamin (MULTIVITAMIN WITH MINERALS) TABS tablet Take 1 tablet by mouth daily.    [name] tiZANidine (ZANAFLEX) 4 MG tablet Take 4 mg by mouth at bedtime.  2   No current facility-administered medications for this visit.    PHYSICAL EXAMINATION: ECOG PERFORMANCE STATUS: 1 - Symptomatic but completely ambulatory  Filed Vitals:   07/17/15 1145  BP: 140/77  Pulse:  72  Temp: 98.3 F (36.8 C)  Resp: 17   Filed Weights   07/17/15 1145  Weight: 128 lb (58.06 kg)    GENERAL:alert, no distress and comfortable SKIN: skin color, texture, turgor are normal, no rashes or significant lesions EYES: normal, Conjunctiva are pink and non-injected, sclera clear OROPHARYNX:no exudate, no erythema and lips, buccal mucosa, and tongue normal  NECK: supple, thyroid normal size, non-tender, without  nodularity LYMPH:  no palpable lymphadenopathy in the cervical, axillary or inguinal LUNGS: clear to auscultation and percussion with normal breathing effort HEART: regular rate & rhythm and no murmurs and no lower extremity edema ABDOMEN:abdomen soft, non-tender and normal bowel sounds MUSCULOSKELETAL:no cyanosis of digits and no clubbing  NEURO: alert & oriented x 3 with fluent speech, no focal motor/sensory deficits EXTREMITIES: No lower extremity edema  LABORATORY DATA:  I have reviewed the data as listed   Chemistry      Component Value Date/Time   NA 138 05/10/2014 0821   K 4.6 05/10/2014 0821   CL 99 05/10/2014 0821   CO2 24 05/10/2014 0821   BUN 17 05/10/2014 0821   CREATININE 0.87 05/10/2014 0821      Component Value Date/Time   CALCIUM 9.8 05/10/2014 0821   ALKPHOS 40 02/01/2014 0850   AST 16 02/01/2014 0850   ALT 15 02/01/2014 0850   BILITOT 0.5 02/01/2014 0850       Lab Results  Component Value Date   WBC 9.3 02/01/2014   HGB 13.2 02/01/2014   HCT 39.5 02/01/2014   MCV 98.5 02/01/2014   PLT 247 02/01/2014   NEUTROABS 4.8 02/01/2014     ASSESSMENT & PLAN:  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 I believe that the risk of recurrence is at the 34% without chemotherapy. With chemotherapy. Risk of recurrence can be reduced at 12-14%.  Recommendation: 1. Neoadjuvant chemotherapy with dose dense Adriamycin and Cytoxan 4 followed by Abraxane weekly 12 2. Followed by surgery followed by 3. Adjuvant radiation therapy followed by 4. Adjuvant anastrozole 1 mg daily  5-10 years.  Chemotherapy Counseling: I discussed the risks and benefits of chemotherapy including the risks of nausea/ vomiting, risk of infection from low WBC count, fatigue due to chemo or anemia, bruising or bleeding due to low platelets, mouth sores, loss/ change in taste and decreased appetite. Liver and kidney function will be monitored through out chemotherapy as abnormalities in liver and kidney function may be a side effect of treatment. Cardiac dysfunction due to Adriamycin was discussed in detail. Risk of permanent bone marrow dysfunction and leukemia due to chemo were also discussed.  Plan: 1. ECHO with Cardiology consult 2. Chemotherapy class 3. Start chemotherapy in 1-2 weeks 4. Labs 1 week from starting chemotherapy and follow-up with M.D. 5. Breast MRI with and without contrast (prior to starting chemotherapy)  Return to clinic with the start of chemotherapy.  No orders of the defined types were placed in this encounter.   The patient has a good understanding of the overall plan. she agrees with it. she will call with  any problems that may develop before the next visit here.   Rulon Eisenmenger, MD 07/17/2015

## 2015-07-18 ENCOUNTER — Ambulatory Visit
Admission: RE | Admit: 2015-07-18 | Discharge: 2015-07-18 | Disposition: A | Discharge status: Home / Self Care | Payer: BC Managed Care – PPO | Source: Ambulatory Visit | Attending: Hematology and Oncology | Admitting: Hematology and Oncology

## 2015-07-18 DIAGNOSIS — C50312 Malignant neoplasm of lower-inner quadrant of left female breast: Secondary | ICD-10-CM

## 2015-07-18 MED ORDER — GADOBENATE DIMEGLUMINE 529 MG/ML IV SOLN
10.0000 mL | Freq: Once | INTRAVENOUS | Status: AC | PRN
  Administered 2015-07-18: 10 mL via INTRAVENOUS

## 2015-07-21 ENCOUNTER — Other Ambulatory Visit: Payer: BC Managed Care – PPO

## 2015-07-21 ENCOUNTER — Encounter: Payer: Self-pay | Admitting: *Deleted

## 2015-07-21 ENCOUNTER — Other Ambulatory Visit: Payer: Self-pay | Admitting: Hematology and Oncology

## 2015-07-21 ENCOUNTER — Encounter: Payer: BC Managed Care – PPO | Admitting: *Deleted

## 2015-07-21 ENCOUNTER — Other Ambulatory Visit: Payer: Self-pay | Admitting: General Surgery

## 2015-07-21 ENCOUNTER — Other Ambulatory Visit: Payer: Self-pay | Admitting: *Deleted

## 2015-07-21 ENCOUNTER — Encounter (HOSPITAL_COMMUNITY): Payer: Self-pay

## 2015-07-21 ENCOUNTER — Encounter: Payer: Self-pay | Admitting: Hematology and Oncology

## 2015-07-21 ENCOUNTER — Encounter (HOSPITAL_COMMUNITY)
Admission: RE | Admit: 2015-07-21 | Discharge: 2015-07-21 | Disposition: A | Discharge status: Home / Self Care | Payer: BC Managed Care – PPO | Source: Ambulatory Visit | Attending: General Surgery | Admitting: General Surgery

## 2015-07-21 DIAGNOSIS — C50312 Malignant neoplasm of lower-inner quadrant of left female breast: Secondary | ICD-10-CM

## 2015-07-21 DIAGNOSIS — C50912 Malignant neoplasm of unspecified site of left female breast: Secondary | ICD-10-CM

## 2015-07-21 LAB — CBC WITH DIFFERENTIAL/PLATELET
BASOS ABS: 0 10*3/uL (ref 0.0–0.1)
BASOS PCT: 0 %
EOS PCT: 2 %
Eosinophils Absolute: 0.2 10*3/uL (ref 0.0–0.7)
HEMATOCRIT: 35.9 % — AB (ref 36.0–46.0)
Hemoglobin: 12 g/dL (ref 12.0–15.0)
LYMPHS PCT: 45 %
Lymphs Abs: 4.6 10*3/uL — ABNORMAL HIGH (ref 0.7–4.0)
MCH: 32.8 pg (ref 26.0–34.0)
MCHC: 33.4 g/dL (ref 30.0–36.0)
MCV: 98.1 fL (ref 78.0–100.0)
Monocytes Absolute: 0.9 10*3/uL (ref 0.1–1.0)
Monocytes Relative: 9 %
NEUTROS ABS: 4.5 10*3/uL (ref 1.7–7.7)
Neutrophils Relative %: 44 %
PLATELETS: 251 10*3/uL (ref 150–400)
RBC: 3.66 MIL/uL — AB (ref 3.87–5.11)
RDW: 12.6 % (ref 11.5–15.5)
WBC: 10.2 10*3/uL (ref 4.0–10.5)

## 2015-07-21 LAB — COMPREHENSIVE METABOLIC PANEL
ALBUMIN: 4.2 g/dL (ref 3.5–5.0)
ALT: 24 U/L (ref 14–54)
AST: 24 U/L (ref 15–41)
Alkaline Phosphatase: 43 U/L (ref 38–126)
Anion gap: 6 (ref 5–15)
BUN: 14 mg/dL (ref 6–20)
CHLORIDE: 104 mmol/L (ref 101–111)
CO2: 27 mmol/L (ref 22–32)
CREATININE: 0.9 mg/dL (ref 0.44–1.00)
Calcium: 9.3 mg/dL (ref 8.9–10.3)
GFR calc Af Amer: >60 mL/min (ref >60)
GLUCOSE: 100 mg/dL — AB (ref 65–99)
POTASSIUM: 3.4 mmol/L — AB (ref 3.5–5.1)
Sodium: 137 mmol/L (ref 135–145)
Total Bilirubin: 0.7 mg/dL (ref 0.3–1.2)
Total Protein: 6.8 g/dL (ref 6.5–8.1)

## 2015-07-21 LAB — RESEARCH LABS

## 2015-07-21 MED ORDER — LIDOCAINE-PRILOCAINE 2.5-2.5 % EX CREA: TOPICAL_CREAM | CUTANEOUS | Status: DC

## 2015-07-21 MED ORDER — LORAZEPAM 0.5 MG PO TABS: 0.5000 mg | ORAL_TABLET | Freq: Every day | ORAL | Status: DC

## 2015-07-21 MED ORDER — DEXAMETHASONE 4 MG PO TABS: 4.0000 mg | ORAL_TABLET | Freq: Every day | ORAL | Status: DC

## 2015-07-21 MED ORDER — ONDANSETRON HCL 8 MG PO TABS: 8.0000 mg | ORAL_TABLET | Freq: Two times a day (BID) | ORAL | Status: DC | PRN

## 2015-07-21 MED ORDER — PROCHLORPERAZINE MALEATE 10 MG PO TABS: 10.0000 mg | ORAL_TABLET | Freq: Four times a day (QID) | ORAL | Status: DC | PRN

## 2015-07-21 NOTE — Progress Notes (Signed)
Left msg for pt to return my call to discuss copay assistance w/ PAF, Neulasta First Step & offer the [employer].

## 2015-07-21 NOTE — Pre-Procedure Instructions (Addendum)
Delight Zeger Fullman  07/21/2015      [id number] [address], [place] [phone] [place] [address] Phone: [phone] Fax: [phone]    Your procedure is scheduled on 07/24/15  Report to [facility] Admitting at 730 A.M.  Call this number if you have problems the morning of surgery:  [phone]   Remember:  Do not eat food or drink liquids after midnight.  Take these medicines the morning of surgery with A SIP OF WATER ziac, arimidex, xanax if needed, hydrocodone if needed  STOP all herbel meds, nsaids (aleve,naproxen,advil,ibuprofen) starting Today including vitamins, aspirin, diclofenac   Do not wear jewelry, make-up or nail polish.  Do not wear lotions, powders, or perfumes.  You may wear deoderant.  Do not shave 48 hours prior to surgery.  Men may shave face and neck.  Do not bring valuables to the hospital.  [facility] is not responsible for any belongings or valuables.  Contacts, dentures or bridgework may not be worn into surgery.  Leave your suitcase in the car.  After surgery it may be brought to your room.  For patients admitted to the hospital, discharge time will be determined by your treatment team.  Patients discharged the day of surgery will not be allowed to drive home.   Name and phone number of your driver:  Special instructions:   Special Instructions: [facility] - Preparing for Surgery  Before surgery, you can play an important role.  Because skin is not sterile, your skin needs to be as free of germs as possible.  You can reduce the number of germs on you skin by washing with CHG (chlorahexidine gluconate) soap before surgery.  CHG is an antiseptic cleaner which kills germs and bonds with the skin to continue killing germs even after washing.  Please DO NOT use if you have an allergy to CHG or antibacterial soaps.  If your skin becomes reddened/irritated stop using the CHG and  inform your nurse when you arrive at Short Stay.  Do not shave (including legs and underarms) for at least 48 hours prior to the first CHG shower.  You may shave your face.  Please follow these instructions carefully:   1.  Shower with CHG Soap the night before surgery and the morning of Surgery.  2.  If you choose to wash your hair, wash your hair first as usual with your normal shampoo.  3.  After you shampoo, rinse your hair and body thoroughly to remove the Shampoo.  4.  Use CHG as you would any other liquid soap.  You can apply chg directly  to the skin and wash gently with scrungie or a clean washcloth.  5.  Apply the CHG Soap to your body ONLY FROM THE NECK DOWN.  Do not use on open wounds or open sores.  Avoid contact with your eyes ears, mouth and genitals (private parts).  Wash genitals (private parts)       with your normal soap.  6.  Wash thoroughly, paying special attention to the area where your surgery will be performed.  7.  Thoroughly rinse your body with warm water from the neck down.  8.  DO NOT shower/wash with your normal soap after using and rinsing off the CHG Soap.  9.  Pat yourself dry with a clean towel.            10.  Wear  clean pajamas.            11.  Place clean sheets on your bed the night of your first shower and do not sleep with pets.  Day of Surgery  Do not apply any lotions/deodorants the morning of surgery.  Please wear clean clothes to the hospital/surgery center.  Please read over the following fact sheets that you were given. Pain Booklet and Surgical Site Infection Prevention

## 2015-07-21 NOTE — Progress Notes (Signed)
K+ low Wrong order originally in epic for this surgery. Office called and spoke with kelly. New order put in epic. Patient is not having lumpectomy until after has chemo for few months.

## 2015-07-22 ENCOUNTER — Telehealth: Payer: Self-pay | Admitting: Hematology and Oncology

## 2015-07-22 ENCOUNTER — Other Ambulatory Visit (HOSPITAL_COMMUNITY): Payer: BC Managed Care – PPO

## 2015-07-22 NOTE — Telephone Encounter (Signed)
lvm to inform pt of infusion and follow up appt dates per pof

## 2015-07-24 ENCOUNTER — Ambulatory Visit (HOSPITAL_COMMUNITY): Payer: BC Managed Care – PPO | Admitting: Emergency Medicine

## 2015-07-24 ENCOUNTER — Encounter (HOSPITAL_COMMUNITY)
Admission: RE | Disposition: A | Discharge status: Home / Self Care | Payer: Self-pay | Source: Ambulatory Visit | Attending: General Surgery

## 2015-07-24 ENCOUNTER — Ambulatory Visit (HOSPITAL_COMMUNITY): Payer: BC Managed Care – PPO

## 2015-07-24 ENCOUNTER — Ambulatory Visit (HOSPITAL_COMMUNITY): Payer: BC Managed Care – PPO | Admitting: Certified Registered Nurse Anesthetist

## 2015-07-24 ENCOUNTER — Encounter: Payer: Self-pay | Admitting: Hematology and Oncology

## 2015-07-24 ENCOUNTER — Encounter (HOSPITAL_COMMUNITY): Payer: Self-pay | Admitting: *Deleted

## 2015-07-24 ENCOUNTER — Ambulatory Visit (HOSPITAL_COMMUNITY)
Admission: RE | Admit: 2015-07-24 | Discharge: 2015-07-24 | Disposition: A | Discharge status: Home / Self Care | Payer: BC Managed Care – PPO | Source: Ambulatory Visit | Attending: General Surgery | Admitting: General Surgery

## 2015-07-24 DIAGNOSIS — Z95828 Presence of other vascular implants and grafts: Secondary | ICD-10-CM

## 2015-07-24 DIAGNOSIS — C50919 Malignant neoplasm of unspecified site of unspecified female breast: Secondary | ICD-10-CM

## 2015-07-24 DIAGNOSIS — C50312 Malignant neoplasm of lower-inner quadrant of left female breast: Secondary | ICD-10-CM | POA: Insufficient documentation

## 2015-07-24 HISTORY — PX: PORTACATH PLACEMENT: SHX2246

## 2015-07-24 SURGERY — INSERTION, TUNNELED CENTRAL VENOUS DEVICE, WITH PORT
Anesthesia: General | Site: Chest

## 2015-07-24 MED ORDER — OXYCODONE HCL 5 MG PO TABS: 5.0000 mg | ORAL_TABLET | Freq: Once | ORAL | Status: DC | PRN

## 2015-07-24 MED ORDER — FENTANYL CITRATE (PF) 250 MCG/5ML IJ SOLN
INTRAMUSCULAR | Status: AC
  Filled 2015-07-24: qty 5

## 2015-07-24 MED ORDER — LIDOCAINE HCL (CARDIAC) 20 MG/ML IV SOLN
INTRAVENOUS | Status: DC | PRN
  Administered 2015-07-24: 80 mg via INTRAVENOUS

## 2015-07-24 MED ORDER — HEPARIN SOD (PORK) LOCK FLUSH 100 UNIT/ML IV SOLN
INTRAVENOUS | Status: DC | PRN
  Administered 2015-07-24: 400 [IU] via INTRAVENOUS

## 2015-07-24 MED ORDER — LACTATED RINGERS IV SOLN
INTRAVENOUS | Status: DC
  Administered 2015-07-24: 08:00:00 via INTRAVENOUS

## 2015-07-24 MED ORDER — PHENYLEPHRINE HCL 10 MG/ML IJ SOLN
INTRAMUSCULAR | Status: DC | PRN
  Administered 2015-07-24 (×2): 40 ug via INTRAVENOUS

## 2015-07-24 MED ORDER — MIDAZOLAM HCL 2 MG/2ML IJ SOLN
INTRAMUSCULAR | Status: AC
  Filled 2015-07-24: qty 2

## 2015-07-24 MED ORDER — FENTANYL CITRATE (PF) 100 MCG/2ML IJ SOLN
INTRAMUSCULAR | Status: AC
  Filled 2015-07-24: qty 2

## 2015-07-24 MED ORDER — FENTANYL CITRATE (PF) 100 MCG/2ML IJ SOLN
25.0000 ug | INTRAMUSCULAR | Status: DC | PRN
  Administered 2015-07-24 (×4): 25 ug via INTRAVENOUS

## 2015-07-24 MED ORDER — EPHEDRINE SULFATE 50 MG/ML IJ SOLN
INTRAMUSCULAR | Status: DC | PRN
  Administered 2015-07-24 (×4): 5 mg via INTRAVENOUS

## 2015-07-24 MED ORDER — ONDANSETRON HCL 4 MG/2ML IJ SOLN
INTRAMUSCULAR | Status: DC | PRN
  Administered 2015-07-24: 4 mg via INTRAVENOUS

## 2015-07-24 MED ORDER — PROPOFOL 10 MG/ML IV BOLUS
INTRAVENOUS | Status: DC | PRN
  Administered 2015-07-24: 150 mg via INTRAVENOUS

## 2015-07-24 MED ORDER — BUPIVACAINE HCL (PF) 0.25 % IJ SOLN
INTRAMUSCULAR | Status: AC
  Filled 2015-07-24: qty 30

## 2015-07-24 MED ORDER — MIDAZOLAM HCL 5 MG/5ML IJ SOLN
INTRAMUSCULAR | Status: DC | PRN
  Administered 2015-07-24: 2 mg via INTRAVENOUS

## 2015-07-24 MED ORDER — FENTANYL CITRATE (PF) 100 MCG/2ML IJ SOLN
INTRAMUSCULAR | Status: DC | PRN
Start: 2015-07-24 — End: 2015-07-24
  Administered 2015-07-24 (×2): 25 ug via INTRAVENOUS

## 2015-07-24 MED ORDER — ONDANSETRON HCL 4 MG/2ML IJ SOLN: 4.0000 mg | Freq: Four times a day (QID) | INTRAMUSCULAR | Status: DC | PRN

## 2015-07-24 MED ORDER — LACTATED RINGERS IV SOLN
INTRAVENOUS | Status: DC | PRN
Start: 2015-07-24 — End: 2015-07-24
  Administered 2015-07-24: 09:00:00 via INTRAVENOUS

## 2015-07-24 MED ORDER — HYDROCODONE-ACETAMINOPHEN 10-325 MG PO TABS: 1.0000 | ORAL_TABLET | Freq: Four times a day (QID) | ORAL | Status: DC | PRN

## 2015-07-24 MED ORDER — HEPARIN SOD (PORK) LOCK FLUSH 100 UNIT/ML IV SOLN
INTRAVENOUS | Status: AC
  Filled 2015-07-24: qty 5

## 2015-07-24 MED ORDER — BUPIVACAINE HCL (PF) 0.25 % IJ SOLN
INTRAMUSCULAR | Status: DC | PRN
  Administered 2015-07-24: 4 mL

## 2015-07-24 MED ORDER — OXYCODONE HCL 5 MG/5ML PO SOLN: 5.0000 mg | Freq: Once | ORAL | Status: DC | PRN

## 2015-07-24 MED ORDER — CEFAZOLIN SODIUM-DEXTROSE 2-4 GM/100ML-% IV SOLN
2.0000 g | INTRAVENOUS | Status: AC
  Administered 2015-07-24: 2 g via INTRAVENOUS
  Filled 2015-07-24: qty 100

## 2015-07-24 SURGICAL SUPPLY — 51 items
BAG DECANTER FOR FLEXI CONT (MISCELLANEOUS) ×2 IMPLANT
BLADE SURG 11 STRL SS (BLADE) ×2 IMPLANT
BLADE SURG 15 STRL LF DISP TIS (BLADE) ×1 IMPLANT
BLADE SURG 15 STRL SS (BLADE) ×1
CANISTER SUCTION 2500CC (MISCELLANEOUS) IMPLANT
CHLORAPREP W/TINT 26ML (MISCELLANEOUS) ×2 IMPLANT
COVER DRAPE ULTRASOUND 610 023 (DRAPES) ×2 IMPLANT
COVER PROBE W GEL 5X96 (DRAPES) ×2 IMPLANT
COVER SURGICAL LIGHT HANDLE (MISCELLANEOUS) ×2 IMPLANT
COVER TRANSDUCER ULTRASND GEL (DRAPE) IMPLANT
CRADLE DONUT ADULT HEAD (MISCELLANEOUS) ×2 IMPLANT
DECANTER SPIKE VIAL GLASS SM (MISCELLANEOUS) ×2 IMPLANT
DRAPE C-ARM 42X72 X-RAY (DRAPES) ×2 IMPLANT
DRAPE LAPAROSCOPIC ABDOMINAL (DRAPES) ×2 IMPLANT
ELECT CAUTERY BLADE 6.4 (BLADE) ×2 IMPLANT
ELECT REM PT RETURN 9FT ADLT (ELECTROSURGICAL) ×2
ELECTRODE REM PT RTRN 9FT ADLT (ELECTROSURGICAL) ×1 IMPLANT
GAUZE SPONGE 4X4 16PLY XRAY LF (GAUZE/BANDAGES/DRESSINGS) ×2 IMPLANT
GEL ULTRASOUND 20GR AQUASONIC (MISCELLANEOUS) IMPLANT
GLOVE BIO SURGEON STRL SZ7 (GLOVE) ×2 IMPLANT
GLOVE BIOGEL PI IND STRL 7.5 (GLOVE) ×1 IMPLANT
GLOVE BIOGEL PI INDICATOR 7.5 (GLOVE) ×1
GOWN STRL REUS W/ TWL LRG LVL3 (GOWN DISPOSABLE) ×2 IMPLANT
GOWN STRL REUS W/TWL LRG LVL3 (GOWN DISPOSABLE) ×2
INTRODUCER COOK 11FR (CATHETERS) IMPLANT
KIT BASIN OR (CUSTOM PROCEDURE TRAY) ×2 IMPLANT
KIT POWER CATH 8FR (Port) ×2 IMPLANT
KIT ROOM TURNOVER OR (KITS) ×2 IMPLANT
LIQUID BAND (GAUZE/BANDAGES/DRESSINGS) ×2 IMPLANT
NEEDLE HYPO 25GX1X1/2 BEV (NEEDLE) ×2 IMPLANT
NS IRRIG 1000ML POUR BTL (IV SOLUTION) ×2 IMPLANT
PACK SURGICAL SETUP 50X90 (CUSTOM PROCEDURE TRAY) ×2 IMPLANT
PAD ARMBOARD 7.5X6 YLW CONV (MISCELLANEOUS) ×4 IMPLANT
PENCIL BUTTON HOLSTER BLD 10FT (ELECTRODE) ×2 IMPLANT
SET INTRODUCER 12FR PACEMAKER (SHEATH) IMPLANT
SET SHEATH INTRODUCER 10FR (MISCELLANEOUS) IMPLANT
SHEATH COOK PEEL AWAY SET 9F (SHEATH) IMPLANT
STAPLER VISISTAT 35W (STAPLE) ×2 IMPLANT
SUT MNCRL AB 4-0 PS2 18 (SUTURE) ×2 IMPLANT
SUT PROLENE 2 0 SH DA (SUTURE) ×2 IMPLANT
SUT SILK 2 0 (SUTURE)
SUT SILK 2-0 18XBRD TIE 12 (SUTURE) IMPLANT
SUT VIC AB 3-0 SH 27 (SUTURE) ×1
SUT VIC AB 3-0 SH 27XBRD (SUTURE) ×1 IMPLANT
SYR 20ML ECCENTRIC (SYRINGE) ×4 IMPLANT
SYR 5ML LUER SLIP (SYRINGE) ×2 IMPLANT
SYR CONTROL 10ML LL (SYRINGE) IMPLANT
TOWEL OR 17X24 6PK STRL BLUE (TOWEL DISPOSABLE) ×2 IMPLANT
TOWEL OR 17X26 10 PK STRL BLUE (TOWEL DISPOSABLE) ×2 IMPLANT
TUBE CONNECTING 12X1/4 (SUCTIONS) IMPLANT
YANKAUER SUCT BULB TIP NO VENT (SUCTIONS) IMPLANT

## 2015-07-24 NOTE — Progress Notes (Signed)
Left another msg for pt to return my call to discuss copay assistance.

## 2015-07-24 NOTE — Transfer of Care (Signed)
Immediate Anesthesia Transfer of Care Note  Patient: Kim Ortega  Procedure(s) Performed: Procedure(s): INSERTION PORT-A-CATH WITH [place] (N/A)  Patient Location: PACU  Anesthesia Type:General  Level of Consciousness: awake, alert , oriented, patient cooperative and responds to stimulation  Airway & Oxygen Therapy: Patient Spontanous Breathing and Patient connected to nasal cannula oxygen  Post-op Assessment: Report given to RN, Post -op Vital signs reviewed and stable and Patient moving all extremities X 4  Post vital signs: Reviewed and stable  Last Vitals:  Filed Vitals:   07/24/15 0805  BP: 130/64  Pulse: 67  Temp: 36.9 C  Resp: 18    Last Pain: There were no vitals filed for this visit.    Patients Stated Pain Goal: 7 ([id number])  Complications: No apparent anesthesia complications

## 2015-07-24 NOTE — Anesthesia Procedure Notes (Signed)
Procedure Name: LMA Insertion Date/Time: 07/24/2015 9:38 AM Performed by: Tressia Miners LEFFEW Pre-anesthesia Checklist: Patient identified, Emergency Drugs available, Suction available, Timeout performed and Patient being monitored Patient Re-evaluated:Patient Re-evaluated prior to inductionOxygen Delivery Method: Circle system utilized Preoxygenation: Pre-oxygenation with 100% oxygen Intubation Type: IV induction Ventilation: Mask ventilation without difficulty LMA: LMA inserted LMA Size: 4.0 Number of attempts: 1 Tube secured with: Tape Dental Injury: Teeth and Oropharynx as per pre-operative assessment

## 2015-07-24 NOTE — Interval H&P Note (Signed)
History and Physical Interval Note:  07/24/2015 9:17 AM  Kim Ortega  has presented today for surgery, with the diagnosis of LEFT BREAST CANCER  The various methods of treatment have been discussed with the patient and family. After consideration of risks, benefits and other options for treatment, the patient has consented to  Procedure(s): INSERTION PORT-A-CATH WITH [place] (N/A) as a surgical intervention .  The patient's history has been reviewed, patient examined, no change in status, stable for surgery.  I have reviewed the patient's chart and labs.  Questions were answered to the patient's satisfaction.     Charleston Vierling

## 2015-07-24 NOTE — Anesthesia Postprocedure Evaluation (Signed)
Anesthesia Post Note  Patient: Kim Ortega  Procedure(s) Performed: Procedure(s) (LRB): INSERTION PORT-A-CATH WITH [place] (N/A)  Patient location during evaluation: PACU Anesthesia Type: General Level of consciousness: awake and alert and patient cooperative Pain management: pain level controlled Vital Signs Assessment: post-procedure vital signs reviewed and stable Respiratory status: spontaneous breathing and respiratory function stable Cardiovascular status: stable Anesthetic complications: no    Last Vitals:  Filed Vitals:   07/24/15 1133 07/24/15 1146  BP: 126/69   Pulse: 65   Temp:  36.5 C  Resp: 12     Last Pain:  Filed Vitals:   07/24/15 1147  PainSc: [place]

## 2015-07-24 NOTE — Op Note (Signed)
Preoperative diagnosis: breast cancer need for venous access Postoperative diagnosis: same as above Procedure: right ij US guided powerport insertion Surgeon: Dr Serita Grammes EBL: minimal Anes: general  Specimens none Complications none Drains none Sponge count correct Dispo to pacu stable  Indications: This is a 81 yof due to begin systemic therapy for breast cancer. We discussed port placement.   Procedure: After informed consent was obtained the patient was taken to the operating room. She was given antibiotics. Sequential compression devices were on her legs. She was then placed under general anesthesia with an LMA. Then she was then prepped and draped in the standard sterile surgical fashion. Surgical timeout was then performed.  I used the ultrasound to identify the right internal jugular vein. I then accessed the vein using the ultrasound. This aspirated blood. I then placed the wire.This was confirmed by fluoroscopy to be in the correct position. I created a pocket on the right chest. I tunneled the line between the 2 sites. I then dilated the tract and placed the dilator assembly with the sheath. This was done under fluoroscopy. I then removed the sheath and dilator. The wire was also removed. The line was then pulled back to be in the distal cava. I hooked this up to the port. I sutured this into place with 2-0 Prolene in 2 places. This aspirated blood and flushed easily.This was confirmed with a final fluoroscopy. I then closed this with 2-0 Vicryl and 4-0 Monocryl. Dermabond was placed on both the incisions.She tolerated this well and was transferred to the recovery room in stable condition.

## 2015-07-24 NOTE — H&P (Signed)
63 yof who presents with no prior personal history of breast disease with new breast cancer. she is referred by Dr Dennard Schaumann. she underwent routine evaluation with mm she had no mass or dc on exam. she has density c breasts. there is possible mass on left breast with negative right breast. left axillary [place] is negative. her mm shows a 2.8 cm mass and on [place] there is at 830 1 cm from the nipple a 2.5x2x2.5 cm mass. biopsy is grade I idc. she is here with her husband today to discuss options. she works as a Marine scientist at [employer] Elbert Ewings, [place]; 06/30/2015 10:05 AM) Back Pain Breast Cancer High blood pressure Lump In Breast  Past Surgical History Elbert Ewings, CMA; 06/30/2015 10:05 AM) Breast Biopsy Left. Hysterectomy (not due to cancer) - Partial Spinal Surgery - Neck Tonsillectomy  Diagnostic Studies History Elbert Ewings, CMA; 06/30/2015 10:05 AM) Colonoscopy within last year  Allergies Elbert Ewings, CMA; 06/30/2015 10:05 AM) No Known Drug Allergies06/26/2017  Medication History Elbert Ewings, CMA; 06/30/2015 10:08 AM) Hydrocodone-Acetaminophen (5-325MG  Tablet, Oral) Active. Fluticasone Propionate (50MCG/ACT Suspension, Nasal) Active. Losartan Potassium (50MG  Tablet, Oral) Active. Bisoprolol-Hydrochlorothiazide (10-6.25MG  Tablet, Oral) Active. Estradiol (2MG  Tablet, Oral) Active. Calcium Carbonate Active. Multiple Vitamin (Oral) Active. ALPRAZolam (0.5MG  Tablet, Oral three times daily) Active. Desonide (0.05% Ointment, External) Active. Allergy Eye (0.025-0.3% Solution, Ophthalmic) Active. ZyrTEC (10MG  Tablet, Oral) Active. Diclofenac Sodium (1% Gel, Transdermal) Active. TraMADol HCl (50MG  Tablet, Oral) Active. Aspirin (81MG  Tablet, Oral) Active. Medications Reconciled  Social History Elbert Ewings, [place]; 06/30/2015 10:05 AM) Alcohol use Moderate alcohol use. Caffeine use Carbonated beverages, Coffee, Tea. Illicit drug use Remotely  quit drug use. Tobacco use Current some day smoker.  Family History Elbert Ewings, [place]; 06/30/2015 10:05 AM) Hypertension Mother. Respiratory Condition Father. Thyroid problems Sister.    Review of Systems Elbert Ewings CMA; 06/30/2015 10:05 AM) General Not Present- Appetite Loss, Chills, Fatigue, Fever, Night Sweats, Weight Gain and Weight Loss. Skin Not Present- Change in Wart/Mole, Dryness, Hives, Jaundice, New Lesions, Non-Healing Wounds, Rash and Ulcer. HEENT Not Present- Earache, Hearing Loss, Hoarseness, Nose Bleed, Oral Ulcers, Ringing in the Ears, Seasonal Allergies, Sinus Pain, Sore Throat, Visual Disturbances, Wears glasses/contact lenses and Yellow Eyes. Respiratory Not Present- Bloody sputum, Chronic Cough, Difficulty Breathing, Snoring and Wheezing. Breast Present- Breast Mass. Not Present- Breast Pain, Nipple Discharge and Skin Changes. Cardiovascular Not Present- Chest Pain, Difficulty Breathing Lying Down, Leg Cramps, Palpitations, Rapid Heart Rate, Shortness of Breath and Swelling of Extremities. Gastrointestinal Not Present- Abdominal Pain, Bloating, Bloody Stool, Change in Bowel Habits, Chronic diarrhea, Constipation, Difficulty Swallowing, Excessive gas, Gets full quickly at meals, Hemorrhoids, Indigestion, Nausea, Rectal Pain and Vomiting. Female Genitourinary Not Present- Frequency, Nocturia, Painful Urination, Pelvic Pain and Urgency. Musculoskeletal Present- Back Pain. Not Present- Joint Pain, Joint Stiffness, Muscle Pain, Muscle Weakness and Swelling of Extremities. Neurological Not Present- Decreased Memory, Fainting, Headaches, Numbness, Seizures, Tingling, Tremor, Trouble walking and Weakness. Psychiatric Not Present- Anxiety, Bipolar, Change in Sleep Pattern, Depression, Fearful and Frequent crying. Endocrine Not Present- Cold Intolerance, Excessive Hunger, Hair Changes, Heat Intolerance, Hot flashes and New Diabetes. Hematology Not Present- Blood Thinners, Easy  Bruising, Excessive bleeding, Gland problems, HIV and Persistent Infections.  Vitals Elbert Ewings CMA; 06/30/2015 10:09 AM) 06/30/2015 10:08 AM Weight: 126 lb Height: 64in Body Surface Area: 1.61 m Body Mass Index: 21.63 kg/m  Temp.: 97.3F(Temporal)  Pulse: 80 (Regular)  BP: 126/72 (Sitting, Left Arm, Standard)   Physical Exam Rolm Bookbinder MD; 06/30/2015  12:17 PM) General Mental Status-Alert. Orientation-Oriented X3.  Head and Neck Neck Global Assessment - decreased range of motion.  Eye Sclera/Conjunctiva - Bilateral-No scleral icterus.  Chest and Lung Exam Chest and lung exam reveals -quiet, even and easy respiratory effort with no use of accessory muscles and on auscultation, normal breath sounds, no adventitious sounds and normal vocal resonance.  Breast Nipples-No Discharge. Breast Lump-No Palpable Breast Mass.  Lymphatic Head & Neck  General Head & Neck Lymphatics: Bilateral - Description - Normal. Axillary  General Axillary Region: Bilateral - Description - Normal. Note: no [place] adenopathy   Assessment & Plan Rolm Bookbinder MD; 06/30/2015 12:19 PM) BREAST CANCER OF LOWER-INNER QUADRANT OF LEFT FEMALE BREAST (C50.312) Port placement for primary chemotherapy

## 2015-07-24 NOTE — Anesthesia Preprocedure Evaluation (Signed)
Anesthesia Evaluation  Patient identified by MRN, date of birth, ID band Patient awake    Reviewed: Allergy & Precautions, H&P , NPO status , Patient's Chart, lab work & pertinent test results  Airway Mallampati: II   Neck ROM: full    Dental   Pulmonary Current Smoker,    breath sounds clear to auscultation       Cardiovascular hypertension, + Peripheral Vascular Disease   Rhythm:regular Rate:Normal     Neuro/Psych Anxiety    GI/Hepatic   Endo/Other    Renal/GU      Musculoskeletal  (+) Arthritis ,   Abdominal   Peds  Hematology   Anesthesia Other Findings   Reproductive/Obstetrics Breast CA                             Anesthesia Physical Anesthesia Plan  ASA: II  Anesthesia Plan: General   Post-op Pain Management:    Induction: Intravenous  Airway Management Planned: LMA  Additional Equipment:   Intra-op Plan:   Post-operative Plan:   Informed Consent: I have reviewed the patients History and Physical, chart, labs and discussed the procedure including the risks, benefits and alternatives for the proposed anesthesia with the patient or authorized representative who has indicated his/her understanding and acceptance.     Plan Discussed with: CRNA, Anesthesiologist and Surgeon  Anesthesia Plan Comments:         Anesthesia Quick Evaluation

## 2015-07-24 NOTE — Discharge Instructions (Signed)
    PORT-A-CATH: POST OP INSTRUCTIONS  Always review your discharge instruction sheet given to you by the facility where your surgery was performed.   1. A prescription for pain medication may be given to you upon discharge. Take your pain medication as prescribed, if needed. If narcotic pain medicine is not needed, then you make take acetaminophen (Tylenol) or ibuprofen (Advil) as needed.  2. Take your usually prescribed medications unless otherwise directed. 3. If you need a refill on your pain medication, please contact our office. All narcotic pain medicine now requires a paper prescription.  Phoned in and fax refills are no longer allowed by law.  Prescriptions will not be filled after 5 pm or on weekends.  4. You should follow a light diet for the remainder of the day after your procedure. 5. Most patients will experience some mild swelling and/or bruising in the area of the incision. It may take several days to resolve. 6. It is common to experience some constipation if taking pain medication after surgery. Increasing fluid intake and taking a stool softener (such as Colace) will usually help or prevent this problem from occurring. A mild laxative (Milk of Magnesia or Miralax) should be taken according to package directions if there are no bowel movements after 48 hours.  7. Unless discharge instructions indicate otherwise, you may remove your bandages 48 hours after surgery, and you may shower at that time. You may have steri-strips (small white skin tapes) in place directly over the incision.  These strips should be left on the skin for 7-10 days.  If your surgeon used Dermabond (skin glue) on the incision, you may shower in 24 hours.  The glue will flake off over the next 2-3 weeks.  8. If your port is left accessed at the end of surgery (needle left in port), the dressing cannot get wet and should only by changed by a healthcare professional. When the port is no longer accessed (when the  needle has been removed), follow step 7.   9. ACTIVITIES:  Limit activity involving your arms for the next 72 hours. Do no strenuous exercise or activity for 1 week. You may drive when you are no longer taking prescription pain medication, you can comfortably wear a seatbelt, and you can maneuver your car. 10.You may need to see your doctor in the office for a follow-up appointment.  Please       check with your doctor.  11.When you receive a new Port-a-Cath, you will get a product guide and        ID card.  Please keep them in case you need them.  WHEN TO CALL YOUR DOCTOR ([phone]): 1. Fever over 101.0 2. Chills 3. Continued bleeding from incision 4. Increased redness and tenderness at the site 5. Shortness of breath, difficulty breathing   The clinic staff is available to answer your questions during regular business hours. Please don't hesitate to call and ask to speak to one of the nurses or medical assistants for clinical concerns. If you have a medical emergency, go to the nearest emergency room or call 911.  A surgeon from [facility] is always on call at the hospital.     For further information, please visit [email or web address]      

## 2015-07-25 ENCOUNTER — Encounter: Payer: Self-pay | Admitting: Hematology and Oncology

## 2015-07-25 ENCOUNTER — Ambulatory Visit (HOSPITAL_COMMUNITY)
Admission: RE | Admit: 2015-07-25 | Discharge: 2015-07-25 | Disposition: A | Discharge status: Home / Self Care | Payer: BC Managed Care – PPO | Source: Ambulatory Visit | Attending: Hematology and Oncology | Admitting: Hematology and Oncology

## 2015-07-25 ENCOUNTER — Encounter (HOSPITAL_COMMUNITY): Payer: Self-pay | Admitting: General Surgery

## 2015-07-25 DIAGNOSIS — C50312 Malignant neoplasm of lower-inner quadrant of left female breast: Secondary | ICD-10-CM | POA: Diagnosis not present

## 2015-07-25 DIAGNOSIS — Z87891 Personal history of nicotine dependence: Secondary | ICD-10-CM | POA: Diagnosis not present

## 2015-07-25 DIAGNOSIS — Z09 Encounter for follow-up examination after completed treatment for conditions other than malignant neoplasm: Secondary | ICD-10-CM | POA: Diagnosis present

## 2015-07-25 NOTE — Progress Notes (Signed)
  Echocardiogram 2D Echocardiogram has been performed.  Kim Ortega 07/25/2015, 10:05 AM

## 2015-07-25 NOTE — Progress Notes (Signed)
Pt returned my call on 07/24/15 so I introduced myself as her FA & to discuss copay assistance w/ the [employer] [place]. Pt came in today providing me w/ her household income to apply for the grants.  I applied to PAF & she was denied because her household income exceeded the guidelines of their program. Pt is also overqualified for the [employer].  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  She has my card if anything changes or if she has any questions or concerns.

## 2015-07-28 ENCOUNTER — Other Ambulatory Visit (HOSPITAL_COMMUNITY): Payer: BC Managed Care – PPO

## 2015-07-29 ENCOUNTER — Encounter: Payer: Self-pay | Admitting: *Deleted

## 2015-07-30 ENCOUNTER — Encounter: Payer: Self-pay | Admitting: *Deleted

## 2015-07-30 ENCOUNTER — Ambulatory Visit (HOSPITAL_BASED_OUTPATIENT_CLINIC_OR_DEPARTMENT_OTHER): Payer: BC Managed Care – PPO

## 2015-07-30 ENCOUNTER — Other Ambulatory Visit (HOSPITAL_BASED_OUTPATIENT_CLINIC_OR_DEPARTMENT_OTHER): Payer: BC Managed Care – PPO

## 2015-07-30 VITALS — BP 132/63 | HR 65 | Temp 98.3°F | Resp 18

## 2015-07-30 DIAGNOSIS — C50312 Malignant neoplasm of lower-inner quadrant of left female breast: Secondary | ICD-10-CM

## 2015-07-30 DIAGNOSIS — Z5111 Encounter for antineoplastic chemotherapy: Secondary | ICD-10-CM

## 2015-07-30 LAB — CBC WITH DIFFERENTIAL/PLATELET
BASO%: 0.2 % (ref 0.0–2.0)
BASOS ABS: 0 10*3/uL (ref 0.0–0.1)
EOS ABS: 0.2 10*3/uL (ref 0.0–0.5)
EOS%: 2.4 % (ref 0.0–7.0)
HCT: 37.3 % (ref 34.8–46.6)
HGB: 12.5 g/dL (ref 11.6–15.9)
LYMPH%: 30.4 % (ref 14.0–49.7)
MCH: 32.6 pg (ref 25.1–34.0)
MCHC: 33.5 g/dL (ref 31.5–36.0)
MCV: 97.1 fL (ref 79.5–101.0)
MONO#: 0.8 10*3/uL (ref 0.1–0.9)
MONO%: 8.7 % (ref 0.0–14.0)
NEUT#: 5 10*3/uL (ref 1.5–6.5)
NEUT%: 58.3 % (ref 38.4–76.8)
PLATELETS: 245 10*3/uL (ref 145–400)
RBC: 3.84 10*6/uL (ref 3.70–5.45)
RDW: 12.7 % (ref 11.2–14.5)
WBC: 8.6 10*3/uL (ref 3.9–10.3)
lymph#: 2.6 10*3/uL (ref 0.9–3.3)

## 2015-07-30 LAB — COMPREHENSIVE METABOLIC PANEL
ALT: 24 U/L (ref 0–55)
ANION GAP: 12 meq/L — AB (ref 3–11)
AST: 21 U/L (ref 5–34)
Albumin: 3.9 g/dL (ref 3.5–5.0)
Alkaline Phosphatase: 57 U/L (ref 40–150)
BILIRUBIN TOTAL: 0.41 mg/dL (ref 0.20–1.20)
BUN: 13.4 mg/dL (ref 7.0–26.0)
CHLORIDE: 105 meq/L (ref 98–109)
CO2: 26 meq/L (ref 22–29)
Calcium: 9.6 mg/dL (ref 8.4–10.4)
Creatinine: 0.8 mg/dL (ref 0.6–1.1)
EGFR: 80 mL/min/{1.73_m2} — AB (ref >90)
Glucose: 91 mg/dl (ref 70–140)
POTASSIUM: 3.9 meq/L (ref 3.5–5.1)
Sodium: 143 mEq/L (ref 136–145)
TOTAL PROTEIN: 7.1 g/dL (ref 6.4–8.3)

## 2015-07-30 MED ORDER — SODIUM CHLORIDE 0.9 % IV SOLN
Freq: Once | INTRAVENOUS | Status: AC
  Administered 2015-07-30: 09:00:00 via INTRAVENOUS

## 2015-07-30 MED ORDER — PALONOSETRON HCL INJECTION 0.25 MG/5ML
INTRAVENOUS | Status: AC
  Filled 2015-07-30: qty 5

## 2015-07-30 MED ORDER — HEPARIN SOD (PORK) LOCK FLUSH 100 UNIT/ML IV SOLN
500.0000 [IU] | Freq: Once | INTRAVENOUS | Status: AC | PRN
  Administered 2015-07-30: 500 [IU]
  Filled 2015-07-30: qty 5

## 2015-07-30 MED ORDER — PEGFILGRASTIM 6 MG/0.6ML ~~LOC~~ PSKT
6.0000 mg | PREFILLED_SYRINGE | Freq: Once | SUBCUTANEOUS | Status: AC
  Administered 2015-07-30: 6 mg via SUBCUTANEOUS
  Filled 2015-07-30: qty 0.6

## 2015-07-30 MED ORDER — SODIUM CHLORIDE 0.9% FLUSH
10.0000 mL | INTRAVENOUS | Status: DC | PRN
  Administered 2015-07-30: 10 mL
  Filled 2015-07-30: qty 10

## 2015-07-30 MED ORDER — SODIUM CHLORIDE 0.9 % IV SOLN
Freq: Once | INTRAVENOUS | Status: AC
  Administered 2015-07-30: 09:00:00 via INTRAVENOUS
  Filled 2015-07-30: qty 5

## 2015-07-30 MED ORDER — SODIUM CHLORIDE 0.9 % IV SOLN
600.0000 mg/m2 | Freq: Once | INTRAVENOUS | Status: AC
  Administered 2015-07-30: 980 mg via INTRAVENOUS
  Filled 2015-07-30: qty 49

## 2015-07-30 MED ORDER — PALONOSETRON HCL INJECTION 0.25 MG/5ML
0.2500 mg | Freq: Once | INTRAVENOUS | Status: AC
  Administered 2015-07-30: 0.25 mg via INTRAVENOUS

## 2015-07-30 MED ORDER — DOXORUBICIN HCL CHEMO IV INJECTION 2 MG/ML
60.0000 mg/m2 | Freq: Once | INTRAVENOUS | Status: AC
  Administered 2015-07-30: 98 mg via INTRAVENOUS
  Filled 2015-07-30: qty 49

## 2015-07-30 NOTE — Patient Instructions (Signed)
[facility] Discharge Instructions for Patients Receiving Chemotherapy  Today you received the following chemotherapy agents Adriamycin, Cytoxan  To help prevent nausea and vomiting after your treatment, we encourage you to take your nausea medication as directed.   If you develop nausea and vomiting that is not controlled by your nausea medication, call the clinic.   BELOW ARE SYMPTOMS THAT SHOULD BE REPORTED IMMEDIATELY:  *FEVER GREATER THAN 100.5 F  *CHILLS WITH OR WITHOUT FEVER  NAUSEA AND VOMITING THAT IS NOT CONTROLLED WITH YOUR NAUSEA MEDICATION  *UNUSUAL SHORTNESS OF BREATH  *UNUSUAL BRUISING OR BLEEDING  TENDERNESS IN MOUTH AND THROAT WITH OR WITHOUT PRESENCE OF ULCERS  *URINARY PROBLEMS  *BOWEL PROBLEMS  UNUSUAL RASH Items with * indicate a potential emergency and should be followed up as soon as possible.  Feel free to call the clinic you have any questions or concerns. The clinic phone number is [phone].  Please show the [facility] at check-in to the Emergency Department and triage nurse.   Doxorubicin injection What is this medicine? DOXORUBICIN (dox oh ROO bi sin) is a chemotherapy drug. It is used to treat many kinds of cancer like Hodgkin's disease, leukemia, non-Hodgkin's lymphoma, neuroblastoma, sarcoma, and Wilms' tumor. It is also used to treat bladder cancer, breast cancer, lung cancer, ovarian cancer, stomach cancer, and thyroid cancer. This medicine may be used for other purposes; ask your health care provider or pharmacist if you have questions. What should I tell my health care provider before I take this medicine? They need to know if you have any of these conditions: -blood disorders -heart disease, recent heart attack -infection (especially a virus infection such as chickenpox, cold sores, or herpes) -irregular heartbeat -liver disease -recent or ongoing radiation therapy -an unusual or allergic reaction to  doxorubicin, other chemotherapy agents, other medicines, foods, dyes, or preservatives -pregnant or trying to get pregnant -breast-feeding How should I use this medicine? This drug is given as an infusion into a vein. It is administered in a hospital or clinic by a specially trained health care professional. If you have pain, swelling, burning or any unusual feeling around the site of your injection, tell your health care professional right away. Talk to your pediatrician regarding the use of this medicine in children. Special care may be needed. Overdosage: If you think you have taken too much of this medicine contact a poison control center or emergency room at once. NOTE: This medicine is only for you. Do not share this medicine with others. What if I miss a dose? It is important not to miss your dose. Call your doctor or health care professional if you are unable to keep an appointment. What may interact with this medicine? Do not take this medicine with any of the following medications: -cisapride -droperidol -halofantrine -pimozide -zidovudine This medicine may also interact with the following medications: -chloroquine -chlorpromazine -clarithromycin -cyclophosphamide -cyclosporine -erythromycin -medicines for depression, anxiety, or psychotic disturbances -medicines for irregular heart beat like amiodarone, bepridil, dofetilide, encainide, flecainide, propafenone, quinidine -medicines for seizures like ethotoin, fosphenytoin, phenytoin -medicines for nausea, vomiting like dolasetron, ondansetron, palonosetron -medicines to increase blood counts like filgrastim, pegfilgrastim, sargramostim -methadone -methotrexate -pentamidine -progesterone -vaccines -verapamil Talk to your doctor or health care professional before taking any of these medicines: -acetaminophen -aspirin -ibuprofen -ketoprofen -naproxen This list may not describe all possible interactions. Give your  health care provider a list of all the medicines, herbs, non-prescription drugs, or dietary supplements you use. Also tell  them if you smoke, drink alcohol, or use illegal drugs. Some items may interact with your medicine. What should I watch for while using this medicine? Your condition will be monitored carefully while you are receiving this medicine. You will need important blood work done while you are taking this medicine. This drug may make you feel generally unwell. This is not uncommon, as chemotherapy can affect healthy cells as well as cancer cells. Report any side effects. Continue your course of treatment even though you feel ill unless your doctor tells you to stop. Your urine may turn red for a few days after your dose. This is not blood. If your urine is dark or brown, call your doctor. In some cases, you may be given additional medicines to help with side effects. Follow all directions for their use. Call your doctor or health care professional for advice if you get a fever, chills or sore throat, or other symptoms of a cold or flu. Do not treat yourself. This drug decreases your body's ability to fight infections. Try to avoid being around people who are sick. This medicine may increase your risk to bruise or bleed. Call your doctor or health care professional if you notice any unusual bleeding. Be careful brushing and flossing your teeth or using a toothpick because you may get an infection or bleed more easily. If you have any dental work done, tell your dentist you are receiving this medicine. Avoid taking products that contain aspirin, acetaminophen, ibuprofen, naproxen, or ketoprofen unless instructed by your doctor. These medicines may hide a fever. Men and women of childbearing age should use effective birth control methods while using taking this medicine. Do not become pregnant while taking this medicine. There is a potential for serious side effects to an unborn child. Talk to  your health care professional or pharmacist for more information. Do not breast-feed an infant while taking this medicine. Do not let others touch your urine or other body fluids for 5 days after each treatment with this medicine. Caregivers should wear latex gloves to avoid touching body fluids during this time. There is a maximum amount of this medicine you should receive throughout your life. The amount depends on the medical condition being treated and your overall health. Your doctor will watch how much of this medicine you receive in your lifetime. Tell your doctor if you have taken this medicine before. What side effects may I notice from receiving this medicine? Side effects that you should report to your doctor or health care professional as soon as possible: -allergic reactions like skin rash, itching or hives, swelling of the face, lips, or tongue -low blood counts - this medicine may decrease the number of white blood cells, red blood cells and platelets. You may be at increased risk for infections and bleeding. -signs of infection - fever or chills, cough, sore throat, pain or difficulty passing urine -signs of decreased platelets or bleeding - bruising, pinpoint red spots on the skin, black, tarry stools, blood in the urine -signs of decreased red blood cells - unusually weak or tired, fainting spells, lightheadedness -breathing problems -chest pain -fast, irregular heartbeat -mouth sores -nausea, vomiting -pain, swelling, redness at site where injected -pain, tingling, numbness in the hands or feet -swelling of ankles, feet, or hands -unusual bleeding or bruising Side effects that usually do not require medical attention (report to your doctor or health care professional if they continue or are bothersome): -diarrhea -facial flushing -hair loss -loss of appetite -  missed menstrual periods -nail discoloration or damage -red or watery eyes -red colored urine -stomach  upset This list may not describe all possible side effects. Call your doctor for medical advice about side effects. You may report side effects to FDA at 1-800-FDA-1088. Where should I keep my medicine? This drug is given in a hospital or clinic and will not be stored at home. NOTE: This sheet is a summary. It may not cover all possible information. If you have questions about this medicine, talk to your doctor, pharmacist, or health care provider.    2016, Elsevier/Gold Standard. (2012-04-18 09:54:34)  Cyclophosphamide injection What is this medicine? CYCLOPHOSPHAMIDE (sye kloe FOSS fa mide) is a chemotherapy drug. It slows the growth of cancer cells. This medicine is used to treat many types of cancer like lymphoma, myeloma, leukemia, breast cancer, and ovarian cancer, to name a few. This medicine may be used for other purposes; ask your health care provider or pharmacist if you have questions. What should I tell my health care provider before I take this medicine? They need to know if you have any of these conditions: -blood disorders -history of other chemotherapy -infection -kidney disease -liver disease -recent or ongoing radiation therapy -tumors in the bone marrow -an unusual or allergic reaction to cyclophosphamide, other chemotherapy, other medicines, foods, dyes, or preservatives -pregnant or trying to get pregnant -breast-feeding How should I use this medicine? This drug is usually given as an injection into a vein or muscle or by infusion into a vein. It is administered in a hospital or clinic by a specially trained health care professional. Talk to your pediatrician regarding the use of this medicine in children. Special care may be needed. Overdosage: If you think you have taken too much of this medicine contact a poison control center or emergency room at once. NOTE: This medicine is only for you. Do not share this medicine with others. What if I miss a dose? It is  important not to miss your dose. Call your doctor or health care professional if you are unable to keep an appointment. What may interact with this medicine? This medicine may interact with the following medications: -amiodarone -amphotericin B -azathioprine -certain antiviral medicines for HIV or AIDS such as protease inhibitors (e.g., indinavir, ritonavir) and zidovudine -certain blood pressure medications such as benazepril, captopril, enalapril, fosinopril, lisinopril, moexipril, monopril, perindopril, quinapril, ramipril, trandolapril -certain cancer medications such as anthracyclines (e.g., daunorubicin, doxorubicin), busulfan, cytarabine, paclitaxel, pentostatin, tamoxifen, trastuzumab -certain diuretics such as chlorothiazide, chlorthalidone, hydrochlorothiazide, indapamide, metolazone -certain medicines that treat or prevent blood clots like warfarin -certain muscle relaxants such as succinylcholine -cyclosporine -etanercept -indomethacin -medicines to increase blood counts like filgrastim, pegfilgrastim, sargramostim -medicines used as general anesthesia -metronidazole -natalizumab This list may not describe all possible interactions. Give your health care provider a list of all the medicines, herbs, non-prescription drugs, or dietary supplements you use. Also tell them if you smoke, drink alcohol, or use illegal drugs. Some items may interact with your medicine. What should I watch for while using this medicine? Visit your doctor for checks on your progress. This drug may make you feel generally unwell. This is not uncommon, as chemotherapy can affect healthy cells as well as cancer cells. Report any side effects. Continue your course of treatment even though you feel ill unless your doctor tells you to stop. Drink water or other fluids as directed. Urinate often, even at night. In some cases, you may be given additional medicines to help with  side effects. Follow all directions for  their use. Call your doctor or health care professional for advice if you get a fever, chills or sore throat, or other symptoms of a cold or flu. Do not treat yourself. This drug decreases your body's ability to fight infections. Try to avoid being around people who are sick. This medicine may increase your risk to bruise or bleed. Call your doctor or health care professional if you notice any unusual bleeding. Be careful brushing and flossing your teeth or using a toothpick because you may get an infection or bleed more easily. If you have any dental work done, tell your dentist you are receiving this medicine. You may get drowsy or dizzy. Do not drive, use machinery, or do anything that needs mental alertness until you know how this medicine affects you. Do not become pregnant while taking this medicine or for 1 year after stopping it. Women should inform their doctor if they wish to become pregnant or think they might be pregnant. Men should not father a child while taking this medicine and for 4 months after stopping it. There is a potential for serious side effects to an unborn child. Talk to your health care professional or pharmacist for more information. Do not breast-feed an infant while taking this medicine. This medicine may interfere with the ability to have a child. This medicine has caused ovarian failure in some women. This medicine has caused reduced sperm counts in some men. You should talk with your doctor or health care professional if you are concerned about your fertility. If you are going to have surgery, tell your doctor or health care professional that you have taken this medicine. What side effects may I notice from receiving this medicine? Side effects that you should report to your doctor or health care professional as soon as possible: -allergic reactions like skin rash, itching or hives, swelling of the face, lips, or tongue -low blood counts - this medicine may decrease the  number of white blood cells, red blood cells and platelets. You may be at increased risk for infections and bleeding. -signs of infection - fever or chills, cough, sore throat, pain or difficulty passing urine -signs of decreased platelets or bleeding - bruising, pinpoint red spots on the skin, black, tarry stools, blood in the urine -signs of decreased red blood cells - unusually weak or tired, fainting spells, lightheadedness -breathing problems -dark urine -dizziness -palpitations -swelling of the ankles, feet, hands -trouble passing urine or change in the amount of urine -weight gain -yellowing of the eyes or skin Side effects that usually do not require medical attention (report to your doctor or health care professional if they continue or are bothersome): -changes in nail or skin color -hair loss -missed menstrual periods -mouth sores -nausea, vomiting This list may not describe all possible side effects. Call your doctor for medical advice about side effects. You may report side effects to FDA at 1-800-FDA-1088. Where should I keep my medicine? This drug is given in a hospital or clinic and will not be stored at home. NOTE: This sheet is a summary. It may not cover all possible information. If you have questions about this medicine, talk to your doctor, pharmacist, or health care provider.    2016, Elsevier/Gold Standard. (2011-11-05 16:22:58)

## 2015-07-31 ENCOUNTER — Telehealth: Payer: Self-pay

## 2015-07-31 NOTE — Telephone Encounter (Signed)
-----   Message from Azzie Glatter, RN sent at 07/30/2015 10:43 AM EDT ----- Regarding: "1st time chemotherapy----per Dr. Lindi Adie" Contact: [phone] Patient received Adriamycin and Cytoxan for the 1st time today; tolerated treatment well with no complaints.

## 2015-07-31 NOTE — Telephone Encounter (Signed)
[place] for chemotherapy F/U.  Left message for pt to call if they have any questions or concerns or any new symptoms to report, along with the phone number.

## 2015-08-06 ENCOUNTER — Ambulatory Visit (HOSPITAL_BASED_OUTPATIENT_CLINIC_OR_DEPARTMENT_OTHER): Payer: BC Managed Care – PPO | Admitting: Hematology and Oncology

## 2015-08-06 ENCOUNTER — Other Ambulatory Visit (HOSPITAL_BASED_OUTPATIENT_CLINIC_OR_DEPARTMENT_OTHER): Payer: BC Managed Care – PPO

## 2015-08-06 ENCOUNTER — Encounter: Payer: Self-pay | Admitting: Hematology and Oncology

## 2015-08-06 DIAGNOSIS — C50312 Malignant neoplasm of lower-inner quadrant of left female breast: Secondary | ICD-10-CM | POA: Diagnosis not present

## 2015-08-06 DIAGNOSIS — M898X9 Other specified disorders of bone, unspecified site: Secondary | ICD-10-CM

## 2015-08-06 LAB — COMPREHENSIVE METABOLIC PANEL
ALBUMIN: 4 g/dL (ref 3.5–5.0)
ALT: 17 U/L (ref 0–55)
ANION GAP: 10 meq/L (ref 3–11)
AST: 14 U/L (ref 5–34)
Alkaline Phosphatase: 80 U/L (ref 40–150)
BUN: 10.6 mg/dL (ref 7.0–26.0)
CO2: 28 meq/L (ref 22–29)
Calcium: 10.1 mg/dL (ref 8.4–10.4)
Chloride: 96 mEq/L — ABNORMAL LOW (ref 98–109)
Creatinine: 0.8 mg/dL (ref 0.6–1.1)
EGFR: 85 mL/min/{1.73_m2} — AB (ref >90)
GLUCOSE: 88 mg/dL (ref 70–140)
POTASSIUM: 3.7 meq/L (ref 3.5–5.1)
Sodium: 134 mEq/L — ABNORMAL LOW (ref 136–145)
Total Bilirubin: 0.33 mg/dL (ref 0.20–1.20)
Total Protein: 7.1 g/dL (ref 6.4–8.3)

## 2015-08-06 LAB — CBC WITH DIFFERENTIAL/PLATELET
BASO%: 0.5 % (ref 0.0–2.0)
Basophils Absolute: 0 10*3/uL (ref 0.0–0.1)
EOS%: 5.4 % (ref 0.0–7.0)
Eosinophils Absolute: 0.2 10*3/uL (ref 0.0–0.5)
HEMATOCRIT: 36.2 % (ref 34.8–46.6)
HEMOGLOBIN: 12.2 g/dL (ref 11.6–15.9)
LYMPH#: 2.3 10*3/uL (ref 0.9–3.3)
LYMPH%: 55.1 % — ABNORMAL HIGH (ref 14.0–49.7)
MCH: 32.5 pg (ref 25.1–34.0)
MCHC: 33.7 g/dL (ref 31.5–36.0)
MCV: 96.4 fL (ref 79.5–101.0)
MONO#: 0.7 10*3/uL (ref 0.1–0.9)
MONO%: 17.3 % — ABNORMAL HIGH (ref 0.0–14.0)
NEUT#: 0.9 10*3/uL — ABNORMAL LOW (ref 1.5–6.5)
NEUT%: 21.7 % — ABNORMAL LOW (ref 38.4–76.8)
Platelets: 153 10*3/uL (ref 145–400)
RBC: 3.75 10*6/uL (ref 3.70–5.45)
RDW: 12.5 % (ref 11.2–14.5)
WBC: 4.1 10*3/uL (ref 3.9–10.3)

## 2015-08-06 NOTE — Assessment & Plan Note (Signed)
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 Neoadjuvant chemotherapy with dose dense Adriamycin and Cytoxan 4 followed by Abraxane weekly 12 2. Followed by surgery followed by 3. Adjuvant radiation therapy followed by 4. Adjuvant anastrozole 1 mg daily 5-10 years --------------------------------------------------------------------------------------------------------------------------------------- Current treatment: Cycle 1 day 1 dose dense Adriamycin and Cytoxan Echocardiogram 07/16/2015 EF 55-60% Labs were reviewed Antiemetics were reviewed Monitoring very closely for chemotherapy toxicities. Return to clinic in 1 week for toxicity check

## 2015-08-06 NOTE — Progress Notes (Signed)
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 ([facility])   06/25/2015 Initial Diagnosis    Left breast biopsy 8:30 position: IDC grade 1, ER 90%, PR 0%, 50%, HER-2 Negative Ratio 1.21, Irregular Mass Left Breast 1 Cm from Nipple 2.5 x 2 x 2.5 Cm with Distortion, T2 N0 Stage II a Clinical Stage, Oncotype DX score 52, 34% ROR     07/30/2015 -  Neo-Adjuvant Chemotherapy    Neoadjuvant chemotherapy with dose dense Adriamycin and Cytoxan 4 followed by Abraxane weekly 12      CHIEF COMPLIANT: Cycle 1 day 8 dose dense Adriamycin and Cytoxan  INTERVAL HISTORY: Kim Ortega is a 60 year old with above-mentioned history of left breast cancer here for neoadjuvant chemotherapy with dose dense Adriamycin Cytoxan. Today cycle 1 day 8. After last week's chemotherapy, she did quite well for a couple of days and then started to note his bone pain that lasted 4 days. She also had mild dizziness that continued to persist almost every day especially when she is up from a lying down position. Today her blood pressure is normal. She reports mild abdominal cramps which improved with Compazine. She does not have any current nausea vomiting. Overall it appears that she tolerated the chemotherapy fairly well.  REVIEW OF SYSTEMS:   Constitutional: Denies fevers, chills or abnormal weight loss Eyes: Denies blurriness of vision Ears, nose, mouth, throat, and face: Denies mucositis or sore throat Respiratory: Denies cough, dyspnea or wheezes Cardiovascular: Denies palpitation, chest discomfort Gastrointestinal: One episode of diarrhea and abdominal cramps Skin: Denies abnormal skin rashes Lymphatics: Denies new lymphadenopathy or easy bruising Neurological:Denies numbness, tingling or new weaknesses Behavioral/Psych: Mood is stable, no new changes  Extremities: No lower extremity edema  All  other systems were reviewed with the patient and are negative.  I have reviewed the past medical history, past surgical history, social history and family history with the patient and they are unchanged from previous note.  ALLERGIES:  has No Known Allergies.  MEDICATIONS:  Current Outpatient Prescriptions  Medication Sig Dispense Refill  . ALPRAZolam (XANAX) 0.5 MG tablet Take 0.5 mg by mouth 3 (three) times daily as needed for anxiety.    [name] anastrozole (ARIMIDEX) 1 MG tablet Take 1 tablet (1 mg total) by mouth daily. 30 tablet 0  . aspirin EC 81 MG tablet Take 81 mg by mouth daily.    . bisoprolol-hydrochlorothiazide (ZIAC) 10-6.25 MG tablet TAKE 1 TABLET BY MOUTH DAILY. 90 tablet 3  . Calcium Carbonate-Vitamin D (CALCIUM-D PO) Take 1 tablet by mouth 2 (two) times daily.     . cetirizine (ZYRTEC) 10 MG tablet Take 10 mg by mouth daily.    . clobetasol (OLUX) 0.05 % topical foam Apply 1 application topically daily as needed (psoriasis).   2  . desonide (DESOWEN) 0.05 % cream Apply 1 application topically daily. For psoriasis    . dexamethasone (DECADRON) 4 MG tablet Take 1 tablet (4 mg total) by mouth daily. Take 1 tablet daily for 3 days starting day after chemotherapy (Patient taking differently: Take 4 mg by mouth See admin instructions. Take 1 tablet (4 mg) daily for 3 days starting day after chemotherapy) 15 tablet 0  . diclofenac sodium (VOLTAREN) 1 % GEL Apply 2 g topically 3 (three) times daily as needed (Pain).     . fluticasone (FLONASE) 50 MCG/ACT nasal spray INHALE  2 SPRAYS IN EACH NOSTRIL DAILY 16 g 7  . HYDROcodone-acetaminophen (NORCO) 10-325 MG tablet Take 1 tablet by mouth every 6 (six) hours as needed. 10 tablet 0  . HYDROcodone-acetaminophen (NORCO/VICODIN) 5-325 MG tablet Take 1-2 tablets by mouth every 6 (six) hours as needed. (Patient taking differently: Take 1-2 tablets by mouth every 6 (six) hours as needed (pain). ) 120 tablet 0  . ibuprofen (ADVIL,MOTRIN) 200 MG  tablet Take 400 mg by mouth every 6 (six) hours as needed for headache (pain).    . Ketotifen Fumarate (ALLERGY EYE DROPS OP) Place 1 drop into both eyes 2 (two) times daily as needed (allergies).     [name] lidocaine-prilocaine (EMLA) cream Apply to affected area once (Patient taking differently: Apply 1 application topically See admin instructions. Apply to porta cath as needed prior to chemo) 30 g 3  . LORazepam (ATIVAN) 0.5 MG tablet Take 1 tablet (0.5 mg total) by mouth at bedtime. 30 tablet 0  . losartan (COZAAR) 50 MG tablet TAKE 1 TABLET BY MOUTH ONCE DAILY. 90 tablet 3  . Multiple Vitamin (MULTIVITAMIN WITH MINERALS) TABS tablet Take 1 tablet by mouth daily.    . ondansetron (ZOFRAN) 8 MG tablet Take 1 tablet (8 mg total) by mouth 2 (two) times daily as needed. Start on the third day after chemotherapy. (Patient taking differently: Take 8 mg by mouth See admin instructions. Take 1 tablet (8 mg) twice daily as needed for nausea/vomiting - Start on the third day after chemotherapy.) 30 tablet 1  . prochlorperazine (COMPAZINE) 10 MG tablet Take 1 tablet (10 mg total) by mouth every 6 (six) hours as needed (Nausea or vomiting). 30 tablet 1  . tiZANidine (ZANAFLEX) 4 MG tablet Take 4 mg by mouth at bedtime as needed (back pain).   2   No current facility-administered medications for this visit.     PHYSICAL EXAMINATION: ECOG PERFORMANCE STATUS: 0 - Asymptomatic  Vitals:   08/06/15 1248  BP: 131/77  Pulse: 82  Resp: 18  Temp: 99 F (37.2 C)   Filed Weights   08/06/15 1248  Weight: 124 lb 4.8 oz (56.4 kg)    GENERAL:alert, no distress and comfortable SKIN: skin color, texture, turgor are normal, no rashes or significant lesions EYES: normal, Conjunctiva are pink and non-injected, sclera clear OROPHARYNX:no exudate, no erythema and lips, buccal mucosa, and tongue normal  NECK: supple, thyroid normal size, non-tender, without nodularity LYMPH:  no palpable lymphadenopathy in the  cervical, axillary or inguinal LUNGS: clear to auscultation and percussion with normal breathing effort HEART: regular rate & rhythm and no murmurs and no lower extremity edema ABDOMEN:abdomen soft, non-tender and normal bowel sounds MUSCULOSKELETAL:no cyanosis of digits and no clubbing  NEURO: alert & oriented x 3 with fluent speech, no focal motor/sensory deficits EXTREMITIES: No lower extremity edema  LABORATORY DATA:  I have reviewed the data as listed   Chemistry      Component Value Date/Time   NA 134 (L) 08/06/2015 1233   K 3.7 08/06/2015 1233   CL 104 07/21/2015 1500   CO2 28 08/06/2015 1233   BUN 10.6 08/06/2015 1233   CREATININE 0.8 08/06/2015 1233      Component Value Date/Time   CALCIUM 10.1 08/06/2015 1233   ALKPHOS 80 08/06/2015 1233   AST 14 08/06/2015 1233   ALT 17 08/06/2015 1233   BILITOT 0.33 08/06/2015 1233       Lab Results  Component Value Date   WBC 4.1 08/06/2015  HGB 12.2 08/06/2015   HCT 36.2 08/06/2015   MCV 96.4 08/06/2015   PLT 153 08/06/2015   NEUTROABS 0.9 (L) 08/06/2015     ASSESSMENT & PLAN:  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 Neoadjuvant chemotherapy with dose dense Adriamycin and Cytoxan 4 followed by Abraxane weekly 12 2. Followed by surgery followed by 3. Adjuvant radiation therapy followed by 4. Adjuvant anastrozole 1 mg daily 5-10 years --------------------------------------------------------------------------------------------------------------------------------------- Current treatment: Cycle 1 day 8 dose dense Adriamycin and Cytoxan Echocardiogram 07/16/2015 EF 55-60%  Chemotherapy toxicities: 1.  Bone pain due to Neulasta: I encouraged her to use Claritin with the next cycle. 2. Mild nausea with abdominal cramps: Resolved with Compazine.   Labs were reviewed and ANC is 900. I discussed with her that we do not need to adjust the dosage of chemotherapy. I encouraged her to watch out for any signs and symptoms of infection. She will call us if she notes any fevers.  Monitoring very closely for chemotherapy toxicities. Return to clinic in 1 week for cycle 2.   No orders of the defined types were placed in this encounter.  The patient has a good understanding of the overall plan. she agrees with it. she will call with any problems that may develop before the next visit here.   Rulon Eisenmenger, MD 08/06/15

## 2015-08-07 ENCOUNTER — Encounter: Payer: Self-pay | Admitting: *Deleted

## 2015-08-07 NOTE — Progress Notes (Signed)
[place] Work phoned pt to follow up with pt as she no showed to her appt to complete her ADRs. CSW left message for pt to return CSW call in order to reschedule appt.   Loren Racer, [place] Worker [place]  [place] Phone: [phone] Fax: [phone]

## 2015-08-12 NOTE — Assessment & Plan Note (Signed)
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 Neoadjuvant chemotherapy with dose dense Adriamycin and Cytoxan 4 followed by Abraxane weekly 12 2. Followed by surgery followed by 3. Adjuvant radiation therapy followed by 4. Adjuvant anastrozole 1 mg daily 5-10 years --------------------------------------------------------------------------------------------------------------------------------------- Current treatment: Cycle 2 day 1 dose dense Adriamycin and Cytoxan Echocardiogram 07/16/2015 EF 55-60%  Chemotherapy toxicities: 1. Bone pain due to Neulasta: I encouraged her to use Claritin with the next cycle. 2. Mild nausea with abdominal cramps: Resolved with Compazine.   Labs were reviewed   Monitoring very closely for chemotherapy toxicities. Return to clinic in 2 week for cycle 3.

## 2015-08-13 ENCOUNTER — Encounter: Payer: Self-pay | Admitting: Hematology and Oncology

## 2015-08-13 ENCOUNTER — Ambulatory Visit (HOSPITAL_BASED_OUTPATIENT_CLINIC_OR_DEPARTMENT_OTHER): Payer: BC Managed Care – PPO

## 2015-08-13 ENCOUNTER — Ambulatory Visit (HOSPITAL_BASED_OUTPATIENT_CLINIC_OR_DEPARTMENT_OTHER): Payer: BC Managed Care – PPO | Admitting: Hematology and Oncology

## 2015-08-13 ENCOUNTER — Other Ambulatory Visit (HOSPITAL_BASED_OUTPATIENT_CLINIC_OR_DEPARTMENT_OTHER): Payer: BC Managed Care – PPO

## 2015-08-13 DIAGNOSIS — M545 Low back pain: Secondary | ICD-10-CM | POA: Diagnosis not present

## 2015-08-13 DIAGNOSIS — C50312 Malignant neoplasm of lower-inner quadrant of left female breast: Secondary | ICD-10-CM | POA: Diagnosis not present

## 2015-08-13 DIAGNOSIS — M898X9 Other specified disorders of bone, unspecified site: Secondary | ICD-10-CM | POA: Diagnosis not present

## 2015-08-13 DIAGNOSIS — G8929 Other chronic pain: Secondary | ICD-10-CM

## 2015-08-13 DIAGNOSIS — Z5111 Encounter for antineoplastic chemotherapy: Secondary | ICD-10-CM | POA: Diagnosis not present

## 2015-08-13 LAB — CBC WITH DIFFERENTIAL/PLATELET
BASO%: 0.6 % (ref 0.0–2.0)
Basophils Absolute: 0.1 10*3/uL (ref 0.0–0.1)
EOS ABS: 0.1 10*3/uL (ref 0.0–0.5)
EOS%: 0.4 % (ref 0.0–7.0)
HCT: 34.2 % — ABNORMAL LOW (ref 34.8–46.6)
HGB: 11.4 g/dL — ABNORMAL LOW (ref 11.6–15.9)
LYMPH%: 16.7 % (ref 14.0–49.7)
MCH: 32.3 pg (ref 25.1–34.0)
MCHC: 33.3 g/dL (ref 31.5–36.0)
MCV: 96.9 fL (ref 79.5–101.0)
MONO#: 1.4 10*3/uL — ABNORMAL HIGH (ref 0.1–0.9)
MONO%: 6.5 % (ref 0.0–14.0)
NEUT%: 75.8 % (ref 38.4–76.8)
NEUTROS ABS: 16.6 10*3/uL — AB (ref 1.5–6.5)
Platelets: 258 10*3/uL (ref 145–400)
RBC: 3.53 10*6/uL — AB (ref 3.70–5.45)
RDW: 12.7 % (ref 11.2–14.5)
WBC: 21.8 10*3/uL — AB (ref 3.9–10.3)
lymph#: 3.6 10*3/uL — ABNORMAL HIGH (ref 0.9–3.3)

## 2015-08-13 LAB — COMPREHENSIVE METABOLIC PANEL
ALT: 23 U/L (ref 0–55)
AST: 20 U/L (ref 5–34)
Albumin: 3.8 g/dL (ref 3.5–5.0)
Alkaline Phosphatase: 105 U/L (ref 40–150)
Anion Gap: 10 mEq/L (ref 3–11)
BUN: 8.4 mg/dL (ref 7.0–26.0)
CO2: 24 meq/L (ref 22–29)
Calcium: 9.6 mg/dL (ref 8.4–10.4)
Chloride: 101 mEq/L (ref 98–109)
Creatinine: 0.8 mg/dL (ref 0.6–1.1)
EGFR: 82 mL/min/{1.73_m2} — AB (ref >90)
GLUCOSE: 99 mg/dL (ref 70–140)
POTASSIUM: 4.5 meq/L (ref 3.5–5.1)
SODIUM: 135 meq/L — AB (ref 136–145)
TOTAL PROTEIN: 6.8 g/dL (ref 6.4–8.3)
Total Bilirubin: <0.3 mg/dL (ref 0.20–1.20)

## 2015-08-13 MED ORDER — DOXORUBICIN HCL CHEMO IV INJECTION 2 MG/ML
60.0000 mg/m2 | Freq: Once | INTRAVENOUS | Status: AC
  Administered 2015-08-13: 98 mg via INTRAVENOUS
  Filled 2015-08-13: qty 49

## 2015-08-13 MED ORDER — PEGFILGRASTIM 6 MG/0.6ML ~~LOC~~ PSKT
6.0000 mg | PREFILLED_SYRINGE | Freq: Once | SUBCUTANEOUS | Status: AC
  Administered 2015-08-13: 6 mg via SUBCUTANEOUS
  Filled 2015-08-13: qty 0.6

## 2015-08-13 MED ORDER — SODIUM CHLORIDE 0.9 % IV SOLN
Freq: Once | INTRAVENOUS | Status: AC
  Administered 2015-08-13: 09:00:00 via INTRAVENOUS
  Filled 2015-08-13: qty 5

## 2015-08-13 MED ORDER — HEPARIN SOD (PORK) LOCK FLUSH 100 UNIT/ML IV SOLN
500.0000 [IU] | Freq: Once | INTRAVENOUS | Status: AC | PRN
  Administered 2015-08-13: 500 [IU]
  Filled 2015-08-13: qty 5

## 2015-08-13 MED ORDER — PALONOSETRON HCL INJECTION 0.25 MG/5ML
INTRAVENOUS | Status: AC
  Filled 2015-08-13: qty 5

## 2015-08-13 MED ORDER — SODIUM CHLORIDE 0.9 % IV SOLN
600.0000 mg/m2 | Freq: Once | INTRAVENOUS | Status: AC
  Administered 2015-08-13: 980 mg via INTRAVENOUS
  Filled 2015-08-13: qty 49

## 2015-08-13 MED ORDER — SODIUM CHLORIDE 0.9% FLUSH
10.0000 mL | INTRAVENOUS | Status: DC | PRN
  Administered 2015-08-13: 10 mL
  Filled 2015-08-13: qty 10

## 2015-08-13 MED ORDER — SODIUM CHLORIDE 0.9 % IV SOLN
Freq: Once | INTRAVENOUS | Status: AC
  Administered 2015-08-13: 09:00:00 via INTRAVENOUS

## 2015-08-13 MED ORDER — PALONOSETRON HCL INJECTION 0.25 MG/5ML
0.2500 mg | Freq: Once | INTRAVENOUS | Status: AC
  Administered 2015-08-13: 0.25 mg via INTRAVENOUS

## 2015-08-13 NOTE — Progress Notes (Signed)
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 ([place])   06/25/2015 Initial Diagnosis    Left breast biopsy 8:30 position: IDC grade 1, ER 90%, PR 0%, 50%, HER-2 Negative Ratio 1.21, Irregular Mass Left Breast 1 Cm from Nipple 2.5 x 2 x 2.5 Cm with Distortion, T2 N0 Stage II a Clinical Stage, Oncotype DX score 52, 34% ROR     07/30/2015 -  Neo-Adjuvant Chemotherapy    Neoadjuvant chemotherapy with dose dense Adriamycin and Cytoxan 4 followed by Abraxane weekly 12     CHIEF COMPLIANT: Cycle 2 dose dense Adriamycin and Cytoxan  INTERVAL HISTORY: Kim Ortega is a 60 year old with above-mentioned history of left breast cancer currently on neoadjuvant chemotherapy and today is cycle 2 of dose dense Adriamycin and Cytoxan. She had bone pain and mild nausea after first cycle of chemotherapy. The nausea has resolved. Bone pain has improved. She denies any fevers or chills. She was able to cut her hair because it was falling off. Her face and antigen levels have returned back to normal. She continues to have chronic low back pain. This started even prior to receiving chemotherapy.  REVIEW OF SYSTEMS:   Constitutional: Denies fevers, chills or abnormal weight loss Eyes: Denies blurriness of vision Ears, nose, mouth, throat, and face: Denies mucositis or sore throat Respiratory: Denies cough, dyspnea or wheezes Cardiovascular: Denies palpitation, chest discomfort Gastrointestinal:  Denies nausea, heartburn or change in bowel habits Skin: Denies abnormal skin rashes Lymphatics: Denies new lymphadenopathy or easy bruising Neurological:Denies numbness, tingling or new weaknesses Behavioral/Psych: Mood is stable, no new changes  Extremities: No lower extremity edema  All other systems were reviewed with the patient and are negative.  I have reviewed the past medical history,  past surgical history, social history and family history with the patient and they are unchanged from previous note.  ALLERGIES:  has No Known Allergies.  MEDICATIONS:  Current Outpatient Prescriptions  Medication Sig Dispense Refill  . ALPRAZolam (XANAX) 0.5 MG tablet Take 0.5 mg by mouth 3 (three) times daily as needed for anxiety.    [name] anastrozole (ARIMIDEX) 1 MG tablet Take 1 tablet (1 mg total) by mouth daily. 30 tablet 0  . aspirin EC 81 MG tablet Take 81 mg by mouth daily.    . bisoprolol-hydrochlorothiazide (ZIAC) 10-6.25 MG tablet TAKE 1 TABLET BY MOUTH DAILY. 90 tablet 3  . Calcium Carbonate-Vitamin D (CALCIUM-D PO) Take 1 tablet by mouth 2 (two) times daily.     . cetirizine (ZYRTEC) 10 MG tablet Take 10 mg by mouth daily.    . clobetasol (OLUX) 0.05 % topical foam Apply 1 application topically daily as needed (psoriasis).   2  . desonide (DESOWEN) 0.05 % cream Apply 1 application topically daily. For psoriasis    . dexamethasone (DECADRON) 4 MG tablet Take 1 tablet (4 mg total) by mouth daily. Take 1 tablet daily for 3 days starting day after chemotherapy (Patient taking differently: Take 4 mg by mouth See admin instructions. Take 1 tablet (4 mg) daily for 3 days starting day after chemotherapy) 15 tablet 0  . diclofenac sodium (VOLTAREN) 1 % GEL Apply 2 g topically 3 (three) times daily as needed (Pain).     . fluticasone (FLONASE) 50 MCG/ACT nasal spray INHALE 2 SPRAYS IN EACH NOSTRIL DAILY 16 g 7  . HYDROcodone-acetaminophen (NORCO) 10-325 MG tablet Take  1 tablet by mouth every 6 (six) hours as needed. 10 tablet 0  . HYDROcodone-acetaminophen (NORCO/VICODIN) 5-325 MG tablet Take 1-2 tablets by mouth every 6 (six) hours as needed. (Patient taking differently: Take 1-2 tablets by mouth every 6 (six) hours as needed (pain). ) 120 tablet 0  . ibuprofen (ADVIL,MOTRIN) 200 MG tablet Take 400 mg by mouth every 6 (six) hours as needed for headache (pain).    . Ketotifen Fumarate (ALLERGY  EYE DROPS OP) Place 1 drop into both eyes 2 (two) times daily as needed (allergies).     [name] lidocaine-prilocaine (EMLA) cream Apply to affected area once (Patient taking differently: Apply 1 application topically See admin instructions. Apply to porta cath as needed prior to chemo) 30 g 3  . LORazepam (ATIVAN) 0.5 MG tablet Take 1 tablet (0.5 mg total) by mouth at bedtime. 30 tablet 0  . losartan (COZAAR) 50 MG tablet TAKE 1 TABLET BY MOUTH ONCE DAILY. 90 tablet 3  . Multiple Vitamin (MULTIVITAMIN WITH MINERALS) TABS tablet Take 1 tablet by mouth daily.    . ondansetron (ZOFRAN) 8 MG tablet Take 1 tablet (8 mg total) by mouth 2 (two) times daily as needed. Start on the third day after chemotherapy. (Patient taking differently: Take 8 mg by mouth See admin instructions. Take 1 tablet (8 mg) twice daily as needed for nausea/vomiting - Start on the third day after chemotherapy.) 30 tablet 1  . prochlorperazine (COMPAZINE) 10 MG tablet Take 1 tablet (10 mg total) by mouth every 6 (six) hours as needed (Nausea or vomiting). 30 tablet 1  . tiZANidine (ZANAFLEX) 4 MG tablet Take 4 mg by mouth at bedtime as needed (back pain).   2   No current facility-administered medications for this visit.     PHYSICAL EXAMINATION: ECOG PERFORMANCE STATUS: 1 - Symptomatic but completely ambulatory  Vitals:   08/13/15 0847  BP: (!) 153/91  Pulse: 74  Resp: 18  Temp: 98.9 F (37.2 C)   Filed Weights   08/13/15 0847  Weight: 127 lb 11.2 oz (57.9 kg)    GENERAL:alert, no distress and comfortable SKIN: skin color, texture, turgor are normal, no rashes or significant lesions EYES: normal, Conjunctiva are pink and non-injected, sclera clear OROPHARYNX:no exudate, no erythema and lips, buccal mucosa, and tongue normal  NECK: supple, thyroid normal size, non-tender, without nodularity LYMPH:  no palpable lymphadenopathy in the cervical, axillary or inguinal LUNGS: clear to auscultation and percussion with normal  breathing effort HEART: regular rate & rhythm and no murmurs and no lower extremity edema ABDOMEN:abdomen soft, non-tender and normal bowel sounds MUSCULOSKELETAL:no cyanosis of digits and no clubbing  NEURO: alert & oriented x 3 with fluent speech, no focal motor/sensory deficits EXTREMITIES: No lower extremity edema   LABORATORY DATA:  I have reviewed the data as listed   Chemistry      Component Value Date/Time   NA 134 (L) 08/06/2015 1233   K 3.7 08/06/2015 1233   CL 104 07/21/2015 1500   CO2 28 08/06/2015 1233   BUN 10.6 08/06/2015 1233   CREATININE 0.8 08/06/2015 1233      Component Value Date/Time   CALCIUM 10.1 08/06/2015 1233   ALKPHOS 80 08/06/2015 1233   AST 14 08/06/2015 1233   ALT 17 08/06/2015 1233   BILITOT 0.33 08/06/2015 1233       Lab Results  Component Value Date   WBC 4.1 08/06/2015   HGB 12.2 08/06/2015   HCT 36.2 08/06/2015   MCV  96.4 08/06/2015   PLT 153 08/06/2015   NEUTROABS 0.9 (L) 08/06/2015   ASSESSMENT & PLAN:  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 Neoadjuvant chemotherapy with dose dense Adriamycin and Cytoxan 4 followed by Abraxane weekly 12 2. Followed by surgery followed by 3. Adjuvant radiation therapy followed by 4. Adjuvant anastrozole 1 mg daily 5-10 years --------------------------------------------------------------------------------------------------------------------------------------- Current treatment: Cycle 2 day 1 dose dense Adriamycin and Cytoxan Echocardiogram 07/16/2015 EF 55-60%  Chemotherapy toxicities: 1. Bone pain due to Neulasta: I encouraged her to use Claritin with the next cycle. 2. Mild  nausea with abdominal cramps: Resolved with Compazine.   Labs were reviewed   Monitoring very closely for chemotherapy toxicities. Return to clinic in 2 week for cycle 3.   No orders of the defined types were placed in this encounter.  The patient has a good understanding of the overall plan. she agrees with it. she will call with any problems that may develop before the next visit here.   Rulon Eisenmenger, MD 08/13/15

## 2015-08-13 NOTE — Patient Instructions (Addendum)
[facility] Discharge Instructions for Patients Receiving Chemotherapy  Today you received the following chemotherapy agents:Adriamycin and Cytoxan   To help prevent nausea and vomiting after your treatment, we encourage you to take your nausea medication as directed.    If you develop nausea and vomiting that is not controlled by your nausea medication, call the clinic.   BELOW ARE SYMPTOMS THAT SHOULD BE REPORTED IMMEDIATELY:  *FEVER GREATER THAN 100.5 F  *CHILLS WITH OR WITHOUT FEVER  NAUSEA AND VOMITING THAT IS NOT CONTROLLED WITH YOUR NAUSEA MEDICATION  *UNUSUAL SHORTNESS OF BREATH  *UNUSUAL BRUISING OR BLEEDING  TENDERNESS IN MOUTH AND THROAT WITH OR WITHOUT PRESENCE OF ULCERS  *URINARY PROBLEMS  *BOWEL PROBLEMS  UNUSUAL RASH Items with * indicate a potential emergency and should be followed up as soon as possible.  Feel free to call the clinic you have any questions or concerns. The clinic phone number is [phone].  Please show the CHEMO ALERT CARD at check-in to the Emergency Department and triage nurse.   

## 2015-08-17 ENCOUNTER — Other Ambulatory Visit: Payer: Self-pay | Admitting: Family Medicine

## 2015-08-19 ENCOUNTER — Encounter (HOSPITAL_COMMUNITY): Payer: Self-pay | Admitting: General Surgery

## 2015-08-27 ENCOUNTER — Encounter: Payer: Self-pay | Admitting: Hematology and Oncology

## 2015-08-27 ENCOUNTER — Other Ambulatory Visit (HOSPITAL_BASED_OUTPATIENT_CLINIC_OR_DEPARTMENT_OTHER): Payer: BC Managed Care – PPO

## 2015-08-27 ENCOUNTER — Ambulatory Visit (HOSPITAL_BASED_OUTPATIENT_CLINIC_OR_DEPARTMENT_OTHER): Payer: BC Managed Care – PPO

## 2015-08-27 ENCOUNTER — Ambulatory Visit (HOSPITAL_BASED_OUTPATIENT_CLINIC_OR_DEPARTMENT_OTHER): Payer: BC Managed Care – PPO | Admitting: Hematology and Oncology

## 2015-08-27 DIAGNOSIS — C50312 Malignant neoplasm of lower-inner quadrant of left female breast: Secondary | ICD-10-CM | POA: Diagnosis not present

## 2015-08-27 DIAGNOSIS — Z5111 Encounter for antineoplastic chemotherapy: Secondary | ICD-10-CM | POA: Diagnosis not present

## 2015-08-27 DIAGNOSIS — D6481 Anemia due to antineoplastic chemotherapy: Secondary | ICD-10-CM | POA: Insufficient documentation

## 2015-08-27 DIAGNOSIS — T451X5A Adverse effect of antineoplastic and immunosuppressive drugs, initial encounter: Secondary | ICD-10-CM

## 2015-08-27 LAB — COMPREHENSIVE METABOLIC PANEL
ALBUMIN: 3.9 g/dL (ref 3.5–5.0)
ALT: 17 U/L (ref 0–55)
AST: 18 U/L (ref 5–34)
Alkaline Phosphatase: 111 U/L (ref 40–150)
Anion Gap: 10 mEq/L (ref 3–11)
BILIRUBIN TOTAL: 0.33 mg/dL (ref 0.20–1.20)
BUN: 7.6 mg/dL (ref 7.0–26.0)
CALCIUM: 9.8 mg/dL (ref 8.4–10.4)
CO2: 29 mEq/L (ref 22–29)
CREATININE: 0.8 mg/dL (ref 0.6–1.1)
Chloride: 102 mEq/L (ref 98–109)
EGFR: 84 mL/min/{1.73_m2} — ABNORMAL LOW (ref >90)
Glucose: 98 mg/dl (ref 70–140)
POTASSIUM: 3.5 meq/L (ref 3.5–5.1)
Sodium: 141 mEq/L (ref 136–145)
Total Protein: 6.9 g/dL (ref 6.4–8.3)

## 2015-08-27 LAB — CBC WITH DIFFERENTIAL/PLATELET
BASO%: 0.4 % (ref 0.0–2.0)
BASOS ABS: 0.1 10*3/uL (ref 0.0–0.1)
EOS ABS: 0.1 10*3/uL (ref 0.0–0.5)
EOS%: 0.2 % (ref 0.0–7.0)
HEMATOCRIT: 30.6 % — AB (ref 34.8–46.6)
HEMOGLOBIN: 10.3 g/dL — AB (ref 11.6–15.9)
LYMPH#: 2.7 10*3/uL (ref 0.9–3.3)
LYMPH%: 11 % — ABNORMAL LOW (ref 14.0–49.7)
MCH: 32.7 pg (ref 25.1–34.0)
MCHC: 33.7 g/dL (ref 31.5–36.0)
MCV: 97.1 fL (ref 79.5–101.0)
MONO#: 1.5 10*3/uL — AB (ref 0.1–0.9)
MONO%: 6.2 % (ref 0.0–14.0)
NEUT#: 20 10*3/uL — ABNORMAL HIGH (ref 1.5–6.5)
NEUT%: 82.2 % — ABNORMAL HIGH (ref 38.4–76.8)
Platelets: 173 10*3/uL (ref 145–400)
RBC: 3.15 10*6/uL — ABNORMAL LOW (ref 3.70–5.45)
RDW: 14 % (ref 11.2–14.5)
WBC: 24.4 10*3/uL — ABNORMAL HIGH (ref 3.9–10.3)

## 2015-08-27 MED ORDER — DOXORUBICIN HCL CHEMO IV INJECTION 2 MG/ML
60.0000 mg/m2 | Freq: Once | INTRAVENOUS | Status: AC
  Administered 2015-08-27: 98 mg via INTRAVENOUS
  Filled 2015-08-27: qty 49

## 2015-08-27 MED ORDER — SODIUM CHLORIDE 0.9 % IV SOLN
Freq: Once | INTRAVENOUS | Status: AC
  Administered 2015-08-27: 11:00:00 via INTRAVENOUS
  Filled 2015-08-27: qty 5

## 2015-08-27 MED ORDER — PEGFILGRASTIM 6 MG/0.6ML ~~LOC~~ PSKT
6.0000 mg | PREFILLED_SYRINGE | Freq: Once | SUBCUTANEOUS | Status: AC
  Administered 2015-08-27: 6 mg via SUBCUTANEOUS
  Filled 2015-08-27: qty 0.6

## 2015-08-27 MED ORDER — SODIUM CHLORIDE 0.9 % IV SOLN
Freq: Once | INTRAVENOUS | Status: AC
  Administered 2015-08-27: 11:00:00 via INTRAVENOUS

## 2015-08-27 MED ORDER — PALONOSETRON HCL INJECTION 0.25 MG/5ML
INTRAVENOUS | Status: AC
  Filled 2015-08-27: qty 5

## 2015-08-27 MED ORDER — PALONOSETRON HCL INJECTION 0.25 MG/5ML
0.2500 mg | Freq: Once | INTRAVENOUS | Status: AC
  Administered 2015-08-27: 0.25 mg via INTRAVENOUS

## 2015-08-27 MED ORDER — HEPARIN SOD (PORK) LOCK FLUSH 100 UNIT/ML IV SOLN
500.0000 [IU] | Freq: Once | INTRAVENOUS | Status: AC | PRN
  Administered 2015-08-27: 500 [IU]
  Filled 2015-08-27: qty 5

## 2015-08-27 MED ORDER — SODIUM CHLORIDE 0.9% FLUSH
10.0000 mL | INTRAVENOUS | Status: DC | PRN
  Administered 2015-08-27: 10 mL
  Filled 2015-08-27: qty 10

## 2015-08-27 MED ORDER — SODIUM CHLORIDE 0.9 % IV SOLN
600.0000 mg/m2 | Freq: Once | INTRAVENOUS | Status: AC
  Administered 2015-08-27: 980 mg via INTRAVENOUS
  Filled 2015-08-27: qty 49

## 2015-08-27 NOTE — Patient Instructions (Signed)
[facility] Discharge Instructions for Patients Receiving Chemotherapy  Today you received the following chemotherapy agents:Adriamycin and Cytoxan   To help prevent nausea and vomiting after your treatment, we encourage you to take your nausea medication as directed.    If you develop nausea and vomiting that is not controlled by your nausea medication, call the clinic.   BELOW ARE SYMPTOMS THAT SHOULD BE REPORTED IMMEDIATELY:  *FEVER GREATER THAN 100.5 F  *CHILLS WITH OR WITHOUT FEVER  NAUSEA AND VOMITING THAT IS NOT CONTROLLED WITH YOUR NAUSEA MEDICATION  *UNUSUAL SHORTNESS OF BREATH  *UNUSUAL BRUISING OR BLEEDING  TENDERNESS IN MOUTH AND THROAT WITH OR WITHOUT PRESENCE OF ULCERS  *URINARY PROBLEMS  *BOWEL PROBLEMS  UNUSUAL RASH Items with * indicate a potential emergency and should be followed up as soon as possible.  Feel free to call the clinic you have any questions or concerns. The clinic phone number is [phone].  Please show the CHEMO ALERT CARD at check-in to the Emergency Department and triage nurse.   

## 2015-08-27 NOTE — Progress Notes (Signed)
1205: Blood return noted before and after Adriamycin push.

## 2015-08-27 NOTE — Progress Notes (Signed)
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 ([place])   06/25/2015 Initial Diagnosis    Left breast biopsy 8:30 position: IDC grade 1, ER 90%, PR 0%, 50%, HER-2 Negative Ratio 1.21, Irregular Mass Left Breast 1 Cm from Nipple 2.5 x 2 x 2.5 Cm with Distortion, T2 N0 Stage II a Clinical Stage, Oncotype DX score 52, 34% ROR      07/30/2015 -  Neo-Adjuvant Chemotherapy    Neoadjuvant chemotherapy with dose dense Adriamycin and Cytoxan 4 followed by Abraxane weekly 12       CHIEF COMPLIANT: Cycle 3 dose dense Adriamycin and Cytoxan  INTERVAL HISTORY: Kim Ortega is a 60-year-old with above-mentioned history left breast cancer currently on neoadjuvant chemotherapy and today's cycle 3 of dose dense Adriamycin and Cytoxan. She has tolerated cycle 2 much better especially because she no exactly what to expect from the treatment. She did have mild nausea for which she took Zofran and Compazine and this settled her stomach. Her insurance does not allow her to get more than 18 tablets per month of Zofran. She has had alopecia and fatigue. She also noticed increased palpitations recently.  REVIEW OF SYSTEMS:   Constitutional: Denies fevers, chills or abnormal weight loss Eyes: Denies blurriness of vision Ears, nose, mouth, throat, and face: Denies mucositis or sore throat Respiratory: Denies cough, dyspnea or wheezes Cardiovascular: Occasional palpitations Gastrointestinal:  Denies nausea, heartburn or change in bowel habits Skin: Denies abnormal skin rashes Lymphatics: Denies new lymphadenopathy or easy bruising Neurological:Denies numbness, tingling or new weaknesses Behavioral/Psych: Mood is stable, no new changes  Extremities: No lower extremity edema  All other systems were reviewed with the patient and are negative.  I have reviewed the past medical history, past  surgical history, social history and family history with the patient and they are unchanged from previous note.  ALLERGIES:  has No Known Allergies.  MEDICATIONS:  Current Outpatient Prescriptions  Medication Sig Dispense Refill  . ALPRAZolam (XANAX) 0.5 MG tablet Take 0.5 mg by mouth 3 (three) times daily as needed for anxiety.    [name] anastrozole (ARIMIDEX) 1 MG tablet Take 1 tablet (1 mg total) by mouth daily. 30 tablet 0  . aspirin EC 81 MG tablet Take 81 mg by mouth daily.    . bisoprolol-hydrochlorothiazide (ZIAC) 10-6.25 MG tablet TAKE 1 TABLET BY MOUTH DAILY. 90 tablet 3  . Calcium Carbonate-Vitamin D (CALCIUM-D PO) Take 1 tablet by mouth 2 (two) times daily.     . cetirizine (ZYRTEC) 10 MG tablet Take 10 mg by mouth daily.    . clobetasol (OLUX) 0.05 % topical foam Apply 1 application topically daily as needed (psoriasis).   2  . desonide (DESOWEN) 0.05 % cream Apply 1 application topically daily. For psoriasis    . diclofenac sodium (VOLTAREN) 1 % GEL Apply 2 g topically 3 (three) times daily as needed (Pain).     . fluticasone (FLONASE) 50 MCG/ACT nasal spray INHALE 2 SPRAYS IN EACH NOSTRIL DAILY 16 g 11  . HYDROcodone-acetaminophen (NORCO) 10-325 MG tablet Take 1 tablet by mouth every 6 (six) hours as needed. 10 tablet 0  . ibuprofen (ADVIL,MOTRIN) 200 MG tablet Take 400 mg by mouth every 6 (six) hours as needed for headache (pain).    . Ketotifen Fumarate (ALLERGY EYE DROPS OP) Place 1 drop into both eyes 2 (two) times daily as  needed (allergies).     [name] lidocaine-prilocaine (EMLA) cream Apply to affected area once (Patient taking differently: Apply 1 application topically See admin instructions. Apply to porta cath as needed prior to chemo) 30 g 3  . LORazepam (ATIVAN) 0.5 MG tablet Take 1 tablet (0.5 mg total) by mouth at bedtime. 30 tablet 0  . losartan (COZAAR) 50 MG tablet TAKE 1 TABLET BY MOUTH ONCE DAILY. 90 tablet 3  . Multiple Vitamin (MULTIVITAMIN WITH MINERALS) TABS tablet  Take 1 tablet by mouth daily.    . ondansetron (ZOFRAN) 8 MG tablet Take 1 tablet (8 mg total) by mouth 2 (two) times daily as needed. Start on the third day after chemotherapy. (Patient taking differently: Take 8 mg by mouth See admin instructions. Take 1 tablet (8 mg) twice daily as needed for nausea/vomiting - Start on the third day after chemotherapy.) 30 tablet 1  . prochlorperazine (COMPAZINE) 10 MG tablet Take 1 tablet (10 mg total) by mouth every 6 (six) hours as needed (Nausea or vomiting). 30 tablet 1  . tiZANidine (ZANAFLEX) 4 MG tablet Take 4 mg by mouth at bedtime as needed (back pain).   2   No current facility-administered medications for this visit.    Facility-Administered Medications Ordered in Other Visits  Medication Dose Route Frequency Provider Last Rate Last Dose  . cyclophosphamide (CYTOXAN) 980 mg in sodium chloride 0.9 % 250 mL chemo infusion  600 mg/m2 (Treatment Plan Recorded) Intravenous Once [name], MD      . DOXOrubicin (ADRIAMYCIN) chemo injection 98 mg  60 mg/m2 (Treatment Plan Recorded) Intravenous Once [name], MD      . fosaprepitant (EMEND) 150 mg, dexamethasone (DECADRON) 12 mg in sodium chloride 0.9 % 145 mL IVPB   Intravenous Once [name], MD 454 mL/hr at 08/27/15 1122    . heparin lock flush 100 unit/mL  500 Units Intracatheter Once PRN [name], MD      . pegfilgrastim (NEULASTA ONPRO KIT) injection 6 mg  6 mg Subcutaneous Once [name], MD      . sodium chloride flush (NS) 0.9 % injection 10 mL  10 mL Intracatheter PRN [name], MD        PHYSICAL EXAMINATION: ECOG PERFORMANCE STATUS: 1 - Symptomatic but completely ambulatory  Vitals:   08/27/15 1021  BP: 126/75  Pulse: 84  Resp: 18  Temp: 98.4 F (36.9 C)   Filed Weights   08/27/15 1021  Weight: 126 lb 1.6 oz (57.2 kg)    GENERAL:alert, no distress and comfortable SKIN: skin color, texture, turgor are normal, no rashes or significant lesions EYES: normal,  Conjunctiva are pink and non-injected, sclera clear OROPHARYNX:no exudate, no erythema and lips, buccal mucosa, and tongue normal  NECK: supple, thyroid normal size, non-tender, without nodularity LYMPH:  no palpable lymphadenopathy in the cervical, axillary or inguinal LUNGS: clear to auscultation and percussion with normal breathing effort HEART: regular rate & rhythm and no murmurs and no lower extremity edema ABDOMEN:abdomen soft, non-tender and normal bowel sounds MUSCULOSKELETAL:no cyanosis of digits and no clubbing  NEURO: alert & oriented x 3 with fluent speech, no focal motor/sensory deficits EXTREMITIES: No lower extremity edema  LABORATORY DATA:  I have reviewed the data as listed   Chemistry      Component Value Date/Time   NA 141 08/27/2015 1009   K 3.5 08/27/2015 1009   CL 104 07/21/2015 1500   CO2 29 08/27/2015 1009   BUN 7.6 08/27/2015 1009  CREATININE 0.8 08/27/2015 1009      Component Value Date/Time   CALCIUM 9.8 08/27/2015 1009   ALKPHOS 111 08/27/2015 1009   AST 18 08/27/2015 1009   ALT 17 08/27/2015 1009   BILITOT 0.33 08/27/2015 1009       Lab Results  Component Value Date   WBC 24.4 (H) 08/27/2015   HGB 10.3 (L) 08/27/2015   HCT 30.6 (L) 08/27/2015   MCV 97.1 08/27/2015   PLT 173 08/27/2015   NEUTROABS 20.0 (H) 08/27/2015     ASSESSMENT & PLAN:  Breast cancer of lower-inner quadrant of left female breast ([place]) Left breast biopsy 8:30 position: IDC grade 1, ER 90%, PR 0%, 50%, HER-2 Negative Ratio 1.21, Irregular Mass Left Breast 1 Cm from Nipple 2.5 x 2 x 2.5 Cm with Distortion,  T2 N0 Stage II a Clinical Stage Oncotype DX score 52, 34% risk of recurrence without chemotherapy  Treatment plan: 1. Neoadjuvant chemotherapy with dose dense Adriamycin and Cytoxan 4 followed by Abraxane weekly 12 2. Followed by surgery followed by 3. Adjuvant radiation therapy followed by 4. Adjuvant anastrozole 1 mg daily 5-10  years --------------------------------------------------------------------------------------------------------------------------------------- Current treatment: Cycle 3 day 1 dose dense Adriamycin and Cytoxan Echocardiogram 07/16/2015 EF 55-60%  Chemotherapy toxicities: 1. Bone pain due to Neulasta: Improved with Claritin. 2. Mild nausea with abdominal cramps: Resolved with Compazine. 3. Palpitations: I will discontinue dexamethasone orally. 4. Alopecia 5. Fatigue 6. Leukocytosis related to Neulasta 7. Chemotherapy-induced anemia: Will be monitored  Return to clinic in 2 weeks for cycle 4.      No orders of the defined types were placed in this encounter.  The patient has a good understanding of the overall plan. she agrees with it. she will call with any problems that may develop before the next visit here.   Rulon Eisenmenger, MD 08/27/15

## 2015-08-27 NOTE — Assessment & Plan Note (Signed)
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 Neoadjuvant chemotherapy with dose dense Adriamycin and Cytoxan 4 followed by Abraxane weekly 12 2. Followed by surgery followed by 3. Adjuvant radiation therapy followed by 4. Adjuvant anastrozole 1 mg daily 5-10 years --------------------------------------------------------------------------------------------------------------------------------------- Current treatment: Cycle 3 day 1 dose dense Adriamycin and Cytoxan Echocardiogram 07/16/2015 EF 55-60%  Chemotherapy toxicities: 1. Bone pain due to Neulasta: Improved with Claritin. 2. Mild nausea with abdominal cramps: Resolved with Compazine. 3. Palpitations: I will discontinue dexamethasone orally. 4. Alopecia 5. Fatigue 6. Leukocytosis related to Neulasta 7. Chemotherapy-induced anemia: Will be monitored  Return to clinic in 2 weeks for cycle 4.

## 2015-09-09 ENCOUNTER — Other Ambulatory Visit: Payer: Self-pay | Admitting: Hematology and Oncology

## 2015-09-09 ENCOUNTER — Encounter: Payer: Self-pay | Admitting: *Deleted

## 2015-09-09 ENCOUNTER — Other Ambulatory Visit (HOSPITAL_BASED_OUTPATIENT_CLINIC_OR_DEPARTMENT_OTHER): Payer: BC Managed Care – PPO

## 2015-09-09 ENCOUNTER — Encounter: Payer: Self-pay | Admitting: Hematology and Oncology

## 2015-09-09 ENCOUNTER — Ambulatory Visit (HOSPITAL_BASED_OUTPATIENT_CLINIC_OR_DEPARTMENT_OTHER): Payer: BC Managed Care – PPO | Admitting: Hematology and Oncology

## 2015-09-09 DIAGNOSIS — C50312 Malignant neoplasm of lower-inner quadrant of left female breast: Secondary | ICD-10-CM | POA: Diagnosis not present

## 2015-09-09 DIAGNOSIS — D6481 Anemia due to antineoplastic chemotherapy: Secondary | ICD-10-CM

## 2015-09-09 LAB — CBC WITH DIFFERENTIAL/PLATELET
BASO%: 0.4 % (ref 0.0–2.0)
Basophils Absolute: 0.1 10*3/uL (ref 0.0–0.1)
EOS ABS: 0.1 10*3/uL (ref 0.0–0.5)
EOS%: 0.2 % (ref 0.0–7.0)
HEMATOCRIT: 26.4 % — AB (ref 34.8–46.6)
HEMOGLOBIN: 8.9 g/dL — AB (ref 11.6–15.9)
LYMPH%: 7 % — ABNORMAL LOW (ref 14.0–49.7)
MCH: 33 pg (ref 25.1–34.0)
MCHC: 33.7 g/dL (ref 31.5–36.0)
MCV: 97.8 fL (ref 79.5–101.0)
MONO#: 2.2 10*3/uL — ABNORMAL HIGH (ref 0.1–0.9)
MONO%: 7.7 % (ref 0.0–14.0)
NEUT%: 84.7 % — ABNORMAL HIGH (ref 38.4–76.8)
NEUTROS ABS: 24.2 10*3/uL — AB (ref 1.5–6.5)
Platelets: 145 10*3/uL (ref 145–400)
RBC: 2.7 10*6/uL — ABNORMAL LOW (ref 3.70–5.45)
RDW: 15.7 % — AB (ref 11.2–14.5)
WBC: 28.5 10*3/uL — ABNORMAL HIGH (ref 3.9–10.3)
lymph#: 2 10*3/uL (ref 0.9–3.3)
nRBC: 1 % — ABNORMAL HIGH (ref 0–0)

## 2015-09-09 LAB — COMPREHENSIVE METABOLIC PANEL
ALBUMIN: 3.6 g/dL (ref 3.5–5.0)
ALK PHOS: 106 U/L (ref 40–150)
ALT: 15 U/L (ref 0–55)
AST: 16 U/L (ref 5–34)
Anion Gap: 11 mEq/L (ref 3–11)
BUN: 10.9 mg/dL (ref 7.0–26.0)
CALCIUM: 9.4 mg/dL (ref 8.4–10.4)
CO2: 27 mEq/L (ref 22–29)
CREATININE: 0.9 mg/dL (ref 0.6–1.1)
Chloride: 105 mEq/L (ref 98–109)
EGFR: 70 mL/min/{1.73_m2} — ABNORMAL LOW (ref >90)
Glucose: 108 mg/dl (ref 70–140)
Potassium: 3.8 mEq/L (ref 3.5–5.1)
Sodium: 142 mEq/L (ref 136–145)
TOTAL PROTEIN: 6.4 g/dL (ref 6.4–8.3)

## 2015-09-09 MED ORDER — ONDANSETRON HCL 8 MG PO TABS: ORAL_TABLET | ORAL | 2 refills | Status: DC

## 2015-09-09 NOTE — Progress Notes (Signed)
Kim Ortega Social Work  Clinical Social Work was referred by patient to review and complete healthcare advance directives.  Clinical Social Worker met with patient and patients husband in [place] office.  The patient designated Kim Ortega as their primary healthcare agent and [name] as their secondary agent.  Patient also completed healthcare living will.    Clinical Social Worker notarized documents and made copies for patient/family. Clinical Social Worker will send documents to medical records to be scanned into patient's chart. Clinical Social Worker encouraged patient/family to contact with any additional questions or concerns.  [name], MSW, LCSW, OSW-C Clinical Social Worker [facility] [phone]

## 2015-09-09 NOTE — Assessment & Plan Note (Signed)
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 Neoadjuvant chemotherapy with dose dense Adriamycin and Cytoxan 4 followed by Abraxane weekly 12 2. Followed by surgery followed by 3. Adjuvant radiation therapy followed by 4. Adjuvant anastrozole 1 mg daily 5-10 years --------------------------------------------------------------------------------------------------------------------------------------- Current treatment: Cycle 4 day 1dose dense Adriamycin and Cytoxan Echocardiogram 07/16/2015 EF 55-60%  Chemotherapy toxicities: 1. Bone pain due to Neulasta: Improved with Claritin. 2.Mild nausea with abdominal cramps: Resolved with Compazine. 3. Palpitations: I will discontinue dexamethasone orally. 4. Alopecia 5. Fatigue 6. Leukocytosis related to Neulasta 7. Chemotherapy-induced anemia: Will be monitored  Return to clinic in 2 weeks for cycle 1 Abraxane.

## 2015-09-09 NOTE — Progress Notes (Signed)
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 ([place])   06/25/2015 Initial Diagnosis    Left breast biopsy 8:30 position: IDC grade 1, ER 90%, PR 0%, 50%, HER-2 Negative Ratio 1.21, Irregular Mass Left Breast 1 Cm from Nipple 2.5 x 2 x 2.5 Cm with Distortion, T2 N0 Stage II a Clinical Stage, Oncotype DX score 52, 34% ROR      07/30/2015 -  Neo-Adjuvant Chemotherapy    Neoadjuvant chemotherapy with dose dense Adriamycin and Cytoxan 4 followed by Abraxane weekly 12       CHIEF COMPLIANT: Cycle 4 of dose dense Adriamycin and Cytoxan  INTERVAL HISTORY: Kim Ortega is a 60 year old with above-mentioned history left breast cancer currently on neoadjuvant chemotherapy and today is cycle 4 of doses Adriamycin Cytoxan. Over the past 2 weeks patient has noticed increasing fatigue. She also had intermittent nausea. She does get palpitations for 2-3 days after chemotherapy.  REVIEW OF SYSTEMS:   Constitutional: Denies fevers, chills or abnormal weight loss, fatigue Eyes: Denies blurriness of vision Ears, nose, mouth, throat, and face: Denies mucositis or sore throat Respiratory: Denies cough, dyspnea or wheezes Cardiovascular: Intermittent palpitations Gastrointestinal: Nausea and vomiting Skin: Denies abnormal skin rashes Lymphatics: Denies new lymphadenopathy or easy bruising Neurological:Denies numbness, tingling or new weaknesses Behavioral/Psych: Mood is stable, no new changes  Extremities: No lower extremity edema Breast:  denies any pain or lumps or nodules in either breasts All other systems were reviewed with the patient and are negative.  I have reviewed the past medical history, past surgical history, social history and family history with the patient and they are unchanged from previous note.  ALLERGIES:  has No Known Allergies.  MEDICATIONS:  Current  Outpatient Prescriptions  Medication Sig Dispense Refill  . ALPRAZolam (XANAX) 0.5 MG tablet Take 0.5 mg by mouth 3 (three) times daily as needed for anxiety.    [name] anastrozole (ARIMIDEX) 1 MG tablet Take 1 tablet (1 mg total) by mouth daily. 30 tablet 0  . aspirin EC 81 MG tablet Take 81 mg by mouth daily.    . bisoprolol-hydrochlorothiazide (ZIAC) 10-6.25 MG tablet TAKE 1 TABLET BY MOUTH DAILY. 90 tablet 3  . Calcium Carbonate-Vitamin D (CALCIUM-D PO) Take 1 tablet by mouth 2 (two) times daily.     . cetirizine (ZYRTEC) 10 MG tablet Take 10 mg by mouth daily.    . clobetasol (OLUX) 0.05 % topical foam Apply 1 application topically daily as needed (psoriasis).   2  . desonide (DESOWEN) 0.05 % cream Apply 1 application topically daily. For psoriasis    . diclofenac sodium (VOLTAREN) 1 % GEL Apply 2 g topically 3 (three) times daily as needed (Pain).     . fluticasone (FLONASE) 50 MCG/ACT nasal spray INHALE 2 SPRAYS IN EACH NOSTRIL DAILY 16 g 11  . HYDROcodone-acetaminophen (NORCO) 10-325 MG tablet Take 1 tablet by mouth every 6 (six) hours as needed. 10 tablet 0  . ibuprofen (ADVIL,MOTRIN) 200 MG tablet Take 400 mg by mouth every 6 (six) hours as needed for headache (pain).    . Ketotifen Fumarate (ALLERGY EYE DROPS OP) Place 1 drop into both eyes 2 (two) times daily as needed (allergies).     [name] lidocaine-prilocaine (EMLA) cream Apply to affected area once (Patient taking differently: Apply 1 application topically See admin instructions. Apply to porta cath as needed prior to  chemo) 30 g 3  . LORazepam (ATIVAN) 0.5 MG tablet Take 1 tablet (0.5 mg total) by mouth at bedtime. 30 tablet 0  . losartan (COZAAR) 50 MG tablet TAKE 1 TABLET BY MOUTH ONCE DAILY. 90 tablet 3  . Multiple Vitamin (MULTIVITAMIN WITH MINERALS) TABS tablet Take 1 tablet by mouth daily.    . ondansetron (ZOFRAN) 8 MG tablet Take 1 tablet (8 mg total) by mouth 2 (two) times daily as needed. Start on the third day after  chemotherapy. (Patient taking differently: Take 8 mg by mouth See admin instructions. Take 1 tablet (8 mg) twice daily as needed for nausea/vomiting - Start on the third day after chemotherapy.) 30 tablet 1  . prochlorperazine (COMPAZINE) 10 MG tablet Take 1 tablet (10 mg total) by mouth every 6 (six) hours as needed (Nausea or vomiting). 30 tablet 1  . tiZANidine (ZANAFLEX) 4 MG tablet Take 4 mg by mouth at bedtime as needed (back pain).   2   No current facility-administered medications for this visit.     PHYSICAL EXAMINATION: ECOG PERFORMANCE STATUS: 1 - Symptomatic but completely ambulatory  Vitals:   09/09/15 1133  BP: 118/67  Pulse: 78  Resp: 18  Temp: 98.6 F (37 C)   Filed Weights   09/09/15 1133  Weight: 125 lb 3.2 oz (56.8 kg)    GENERAL:alert, no distress and comfortable SKIN: skin color, texture, turgor are normal, no rashes or significant lesions EYES: normal, Conjunctiva are pink and non-injected, sclera clear OROPHARYNX:no exudate, no erythema and lips, buccal mucosa, and tongue normal  NECK: supple, thyroid normal size, non-tender, without nodularity LYMPH:  no palpable lymphadenopathy in the cervical, axillary or inguinal LUNGS: clear to auscultation and percussion with normal breathing effort HEART: regular rate & rhythm and no murmurs and no lower extremity edema ABDOMEN:abdomen soft, non-tender and normal bowel sounds MUSCULOSKELETAL:no cyanosis of digits and no clubbing  NEURO: alert & oriented x 3 with fluent speech, no focal motor/sensory deficits EXTREMITIES: No lower extremity edema  LABORATORY DATA:  I have reviewed the data as listed   Chemistry      Component Value Date/Time   NA 141 08/27/2015 1009   K 3.5 08/27/2015 1009   CL 104 07/21/2015 1500   CO2 29 08/27/2015 1009   BUN 7.6 08/27/2015 1009   CREATININE 0.8 08/27/2015 1009      Component Value Date/Time   CALCIUM 9.8 08/27/2015 1009   ALKPHOS 111 08/27/2015 1009   AST 18  08/27/2015 1009   ALT 17 08/27/2015 1009   BILITOT 0.33 08/27/2015 1009       Lab Results  Component Value Date   WBC 28.5 (H) 09/09/2015   HGB 8.9 (L) 09/09/2015   HCT 26.4 (L) 09/09/2015   MCV 97.8 09/09/2015   PLT 145 09/09/2015   NEUTROABS 24.2 (H) 09/09/2015     ASSESSMENT & PLAN:  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 Neoadjuvant chemotherapy with dose dense Adriamycin and Cytoxan 4 followed by Abraxane weekly 12 2. Followed by surgery followed by 3. Adjuvant radiation therapy followed by 4. Adjuvant anastrozole 1 mg daily 5-10 years --------------------------------------------------------------------------------------------------------------------------------------- Current treatment: Cycle 4  day 1dose dense Adriamycin and Cytoxan Echocardiogram 07/16/2015 EF 55-60%  Chemotherapy toxicities: 1. Bone pain due to Neulasta: Improved with Claritin. 2.Mild nausea with abdominal cramps: Resolved with Compazine. 3. Palpitations: Wel discontinued dexamethasone orally. 4. Alopecia 5. Fatigue 6. Leukocytosis related to Neulasta 7. Chemotherapy-induced anemia: Will be monitored, hemoglobin 8.9  Return to clinic in 2 weeks for cycle 1 Abraxane.   No orders of the defined types were placed in this encounter.  The patient has a good understanding of the overall plan. she agrees with it. she will call with any problems that may develop before the next visit here.   Rulon Eisenmenger, MD 09/09/15

## 2015-09-10 ENCOUNTER — Other Ambulatory Visit: Payer: BC Managed Care – PPO

## 2015-09-10 ENCOUNTER — Encounter: Payer: Self-pay | Admitting: *Deleted

## 2015-09-10 ENCOUNTER — Ambulatory Visit (HOSPITAL_BASED_OUTPATIENT_CLINIC_OR_DEPARTMENT_OTHER): Payer: BC Managed Care – PPO

## 2015-09-10 VITALS — BP 122/59 | HR 73 | Temp 98.9°F | Resp 18

## 2015-09-10 DIAGNOSIS — Z5111 Encounter for antineoplastic chemotherapy: Secondary | ICD-10-CM | POA: Diagnosis not present

## 2015-09-10 DIAGNOSIS — C50312 Malignant neoplasm of lower-inner quadrant of left female breast: Secondary | ICD-10-CM | POA: Diagnosis not present

## 2015-09-10 MED ORDER — PALONOSETRON HCL INJECTION 0.25 MG/5ML
INTRAVENOUS | Status: AC
  Filled 2015-09-10: qty 5

## 2015-09-10 MED ORDER — HEPARIN SOD (PORK) LOCK FLUSH 100 UNIT/ML IV SOLN
500.0000 [IU] | Freq: Once | INTRAVENOUS | Status: AC | PRN
  Administered 2015-09-10: 500 [IU]
  Filled 2015-09-10: qty 5

## 2015-09-10 MED ORDER — PEGFILGRASTIM 6 MG/0.6ML ~~LOC~~ PSKT
6.0000 mg | PREFILLED_SYRINGE | Freq: Once | SUBCUTANEOUS | Status: AC
  Administered 2015-09-10: 6 mg via SUBCUTANEOUS
  Filled 2015-09-10: qty 0.6

## 2015-09-10 MED ORDER — PALONOSETRON HCL INJECTION 0.25 MG/5ML
0.2500 mg | Freq: Once | INTRAVENOUS | Status: AC
  Administered 2015-09-10: 0.25 mg via INTRAVENOUS

## 2015-09-10 MED ORDER — SODIUM CHLORIDE 0.9 % IV SOLN
Freq: Once | INTRAVENOUS | Status: AC
  Administered 2015-09-10: 09:00:00 via INTRAVENOUS

## 2015-09-10 MED ORDER — SODIUM CHLORIDE 0.9% FLUSH
10.0000 mL | INTRAVENOUS | Status: DC | PRN
  Administered 2015-09-10: 10 mL
  Filled 2015-09-10: qty 10

## 2015-09-10 MED ORDER — SODIUM CHLORIDE 0.9 % IV SOLN
Freq: Once | INTRAVENOUS | Status: AC
  Administered 2015-09-10: 10:00:00 via INTRAVENOUS
  Filled 2015-09-10: qty 5

## 2015-09-10 MED ORDER — CYCLOPHOSPHAMIDE CHEMO INJECTION 1 GM
600.0000 mg/m2 | Freq: Once | INTRAMUSCULAR | Status: AC
  Administered 2015-09-10: 980 mg via INTRAVENOUS
  Filled 2015-09-10: qty 49

## 2015-09-10 MED ORDER — DOXORUBICIN HCL CHEMO IV INJECTION 2 MG/ML
60.0000 mg/m2 | Freq: Once | INTRAVENOUS | Status: AC
  Administered 2015-09-10: 98 mg via INTRAVENOUS
  Filled 2015-09-10: qty 49

## 2015-09-10 NOTE — Patient Instructions (Signed)
[facility] Discharge Instructions for Patients Receiving Chemotherapy  Today you received the following chemotherapy agents:Adriamycin and Cytoxan   To help prevent nausea and vomiting after your treatment, we encourage you to take your nausea medication as directed.    If you develop nausea and vomiting that is not controlled by your nausea medication, call the clinic.   BELOW ARE SYMPTOMS THAT SHOULD BE REPORTED IMMEDIATELY:  *FEVER GREATER THAN 100.5 F  *CHILLS WITH OR WITHOUT FEVER  NAUSEA AND VOMITING THAT IS NOT CONTROLLED WITH YOUR NAUSEA MEDICATION  *UNUSUAL SHORTNESS OF BREATH  *UNUSUAL BRUISING OR BLEEDING  TENDERNESS IN MOUTH AND THROAT WITH OR WITHOUT PRESENCE OF ULCERS  *URINARY PROBLEMS  *BOWEL PROBLEMS  UNUSUAL RASH Items with * indicate a potential emergency and should be followed up as soon as possible.  Feel free to call the clinic you have any questions or concerns. The clinic phone number is [phone].  Please show the CHEMO ALERT CARD at check-in to the Emergency Department and triage nurse.   

## 2015-09-11 ENCOUNTER — Telehealth (HOSPITAL_COMMUNITY): Payer: Self-pay | Admitting: Vascular Surgery

## 2015-09-11 NOTE — Telephone Encounter (Signed)
Left message to make NP W/ echo

## 2015-09-12 ENCOUNTER — Encounter: Payer: Self-pay | Admitting: Family Medicine

## 2015-09-15 ENCOUNTER — Other Ambulatory Visit: Payer: Self-pay | Admitting: Family Medicine

## 2015-09-15 DIAGNOSIS — M5440 Lumbago with sciatica, unspecified side: Secondary | ICD-10-CM

## 2015-09-15 DIAGNOSIS — R0989 Other specified symptoms and signs involving the circulatory and respiratory systems: Secondary | ICD-10-CM

## 2015-09-15 MED ORDER — HYDROCODONE-ACETAMINOPHEN 10-325 MG PO TABS: 1.0000 | ORAL_TABLET | Freq: Four times a day (QID) | ORAL | 0 refills | Status: DC | PRN

## 2015-09-22 NOTE — Assessment & Plan Note (Addendum)
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 Neoadjuvant chemotherapy with dose dense Adriamycin and Cytoxan 4 followed by Abraxane weekly 12 2. Followed by surgery followed by 3. Adjuvant radiation therapy followed by 4. Adjuvant anastrozole 1 mg daily 5-10 years --------------------------------------------------------------------------------------------------------------------------------------- Current treatment: Cycle 4day 1dose dense Adriamycin and Cytoxan, today is cycle 1/12 Abraxane Echocardiogram 07/16/2015 EF 55-60%  Chemotherapy toxicities: 1. Bone pain due to Neulasta: Improved with Claritin. 2.Mild nausea with abdominal cramps: Resolved with Compazine. 3. Palpitations: Wel discontinued dexamethasone orally. 4. Alopecia 5. Fatigue 6. Leukocytosis related to Neulasta 7. Chemotherapy-induced anemia: Will be monitored, hemoglobin 8.9  Return to clinic in 1 week for cycle 2 Abraxane.

## 2015-09-23 ENCOUNTER — Encounter: Payer: Self-pay | Admitting: *Deleted

## 2015-09-23 ENCOUNTER — Other Ambulatory Visit: Payer: Self-pay

## 2015-09-23 ENCOUNTER — Ambulatory Visit (HOSPITAL_BASED_OUTPATIENT_CLINIC_OR_DEPARTMENT_OTHER): Payer: BC Managed Care – PPO | Admitting: Hematology and Oncology

## 2015-09-23 ENCOUNTER — Encounter: Payer: Self-pay | Admitting: Hematology and Oncology

## 2015-09-23 ENCOUNTER — Ambulatory Visit: Payer: BC Managed Care – PPO

## 2015-09-23 ENCOUNTER — Other Ambulatory Visit (HOSPITAL_BASED_OUTPATIENT_CLINIC_OR_DEPARTMENT_OTHER): Payer: BC Managed Care – PPO

## 2015-09-23 VITALS — BP 123/70 | HR 72 | Temp 98.4°F | Resp 18 | Wt 124.7 lb

## 2015-09-23 DIAGNOSIS — D6481 Anemia due to antineoplastic chemotherapy: Secondary | ICD-10-CM | POA: Diagnosis not present

## 2015-09-23 DIAGNOSIS — D72819 Decreased white blood cell count, unspecified: Secondary | ICD-10-CM | POA: Diagnosis not present

## 2015-09-23 DIAGNOSIS — D72829 Elevated white blood cell count, unspecified: Secondary | ICD-10-CM

## 2015-09-23 DIAGNOSIS — C50312 Malignant neoplasm of lower-inner quadrant of left female breast: Secondary | ICD-10-CM

## 2015-09-23 LAB — CBC WITH DIFFERENTIAL/PLATELET
BASO%: 3.1 % — ABNORMAL HIGH (ref 0.0–2.0)
Basophils Absolute: 0 10*3/uL (ref 0.0–0.1)
EOS ABS: 0 10*3/uL (ref 0.0–0.5)
EOS%: 3.1 % (ref 0.0–7.0)
HEMATOCRIT: 24.8 % — AB (ref 34.8–46.6)
HEMOGLOBIN: 8.6 g/dL — AB (ref 11.6–15.9)
LYMPH#: 0.4 10*3/uL — AB (ref 0.9–3.3)
LYMPH%: 44.3 % (ref 14.0–49.7)
MCH: 34 pg (ref 25.1–34.0)
MCHC: 34.7 g/dL (ref 31.5–36.0)
MCV: 98 fL (ref 79.5–101.0)
MONO#: 0.3 10*3/uL (ref 0.1–0.9)
MONO%: 25.8 % — ABNORMAL HIGH (ref 0.0–14.0)
NEUT%: 23.7 % — ABNORMAL LOW (ref 38.4–76.8)
NEUTROS ABS: 0.2 10*3/uL — AB (ref 1.5–6.5)
PLATELETS: 112 10*3/uL — AB (ref 145–400)
RBC: 2.53 10*6/uL — AB (ref 3.70–5.45)
RDW: 17.4 % — AB (ref 11.2–14.5)
WBC: 1 10*3/uL — AB (ref 3.9–10.3)

## 2015-09-23 LAB — COMPREHENSIVE METABOLIC PANEL
ALT: 14 U/L (ref 0–55)
AST: 17 U/L (ref 5–34)
Albumin: 3.7 g/dL (ref 3.5–5.0)
Alkaline Phosphatase: 72 U/L (ref 40–150)
Anion Gap: 13 mEq/L — ABNORMAL HIGH (ref 3–11)
BUN: 6.8 mg/dL — AB (ref 7.0–26.0)
CHLORIDE: 101 meq/L (ref 98–109)
CO2: 26 meq/L (ref 22–29)
Calcium: 9.7 mg/dL (ref 8.4–10.4)
Creatinine: 0.7 mg/dL (ref 0.6–1.1)
EGFR: >90 mL/min/{1.73_m2} (ref >90)
GLUCOSE: 105 mg/dL (ref 70–140)
POTASSIUM: 4 meq/L (ref 3.5–5.1)
SODIUM: 140 meq/L (ref 136–145)
Total Bilirubin: 0.34 mg/dL (ref 0.20–1.20)
Total Protein: 6.7 g/dL (ref 6.4–8.3)

## 2015-09-23 NOTE — Progress Notes (Signed)
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 Being postponed for low ANC  INTERVAL HISTORY: Kim Ortega is a 60 year old with above-mentioned history of left breast cancer currently neoadjuvant chemotherapy. She completed 4 cycles of Adriamycin and Cytoxan. She had felt significant fatigue as well as a maculopapular rash on the chest wall and some for changes related to Cytoxan. She is supposed to start first dose of Abraxane today however her blood counts are low with an ANC of only 200. It appears that there was some leakage of the Neulasta and that may have resulted in low ANC. She is feeling more tired than previously.  REVIEW OF SYSTEMS:   Constitutional: Denies fevers, chills or abnormal weight loss Eyes: Denies blurriness of vision Ears, nose, mouth, throat, and face: Denies mucositis or sore throat Respiratory: Denies cough, dyspnea or wheezes Cardiovascular: Denies palpitation, chest discomfort Gastrointestinal:  Denies nausea, heartburn or change in bowel habits Skin: Denies abnormal skin rashes Lymphatics: Denies new lymphadenopathy or easy bruising Neurological:Denies numbness, tingling or new weaknesses Behavioral/Psych: Mood is stable, no new changes  Extremities: No lower extremity edema  All other systems were reviewed with the patient and are negative.  I have  reviewed the past medical history, past surgical history, social history and family history with the patient and they are unchanged from previous note.  ALLERGIES:  has No Known Allergies.  MEDICATIONS:  Current Outpatient Prescriptions  Medication Sig Dispense Refill  . ALPRAZolam (XANAX) 0.5 MG tablet Take 0.5 mg by mouth 3 (three) times daily as needed for anxiety.    [name] anastrozole (ARIMIDEX) 1 MG tablet Take 1 tablet (1 mg total) by mouth daily. 30 tablet 0  . aspirin EC 81 MG tablet Take 81 mg by mouth daily.    . bisoprolol-hydrochlorothiazide (ZIAC) 10-6.25 MG tablet TAKE 1 TABLET BY MOUTH DAILY. 90 tablet 3  . Calcium Carbonate-Vitamin D (CALCIUM-D PO) Take 1 tablet by mouth 2 (two) times daily.     . cetirizine (ZYRTEC) 10 MG tablet Take 10 mg by mouth daily.    . clobetasol (OLUX) 0.05 % topical foam Apply 1 application topically daily as needed (psoriasis).   2  . desonide (DESOWEN) 0.05 % cream Apply 1 application topically daily. For psoriasis    . diclofenac sodium (VOLTAREN) 1 % GEL Apply 2 g topically 3 (three) times daily as needed (Pain).     . fluticasone (FLONASE) 50 MCG/ACT nasal spray INHALE 2 SPRAYS IN EACH NOSTRIL DAILY 16 g 11  . HYDROcodone-acetaminophen (NORCO) 10-325 MG tablet Take 1 tablet by mouth every 6 (six) hours as needed. 60 tablet 0  . ibuprofen (ADVIL,MOTRIN) 200 MG tablet Take 400 mg by mouth every 6 (six) hours as needed for headache (pain).    . Ketotifen Fumarate (ALLERGY EYE DROPS OP) Place  1 drop into both eyes 2 (two) times daily as needed (allergies).     . losartan (COZAAR) 50 MG tablet TAKE 1 TABLET BY MOUTH ONCE DAILY. 90 tablet 3  . Multiple Vitamin (MULTIVITAMIN WITH MINERALS) TABS tablet Take 1 tablet by mouth daily.    . ondansetron (ZOFRAN) 8 MG tablet Take 1 tablet (8 mg) twice daily as needed for nausea/vomiting 60 tablet 2  . tiZANidine (ZANAFLEX) 4 MG tablet Take 4 mg by mouth at bedtime as needed (back pain).   2   No current  facility-administered medications for this visit.     PHYSICAL EXAMINATION: ECOG PERFORMANCE STATUS: 1 - Symptomatic but completely ambulatory  Vitals:   09/23/15 0800  BP: 123/70  Pulse: 72  Resp: 18  Temp: 98.4 F (36.9 C)   Filed Weights   09/23/15 0800  Weight: 124 lb 11.2 oz (56.6 kg)    GENERAL:alert, no distress and comfortable SKIN: skin color, texture, turgor are normal, no rashes or significant lesions EYES: normal, Conjunctiva are pink and non-injected, sclera clear OROPHARYNX:no exudate, no erythema and lips, buccal mucosa, and tongue normal  NECK: supple, thyroid normal size, non-tender, without nodularity LYMPH:  no palpable lymphadenopathy in the cervical, axillary or inguinal LUNGS: clear to auscultation and percussion with normal breathing effort HEART: regular rate & rhythm and no murmurs and no lower extremity edema ABDOMEN:abdomen soft, non-tender and normal bowel sounds MUSCULOSKELETAL:no cyanosis of digits and no clubbing  NEURO: alert & oriented x 3 with fluent speech, no focal motor/sensory deficits EXTREMITIES: No lower extremity edema  LABORATORY DATA:  I have reviewed the data as listed   Chemistry      Component Value Date/Time   NA 140 09/23/2015 0803   K 4.0 09/23/2015 0803   CL 104 07/21/2015 1500   CO2 26 09/23/2015 0803   BUN 6.8 (L) 09/23/2015 0803   CREATININE 0.7 09/23/2015 0803      Component Value Date/Time   CALCIUM 9.7 09/23/2015 0803   ALKPHOS 72 09/23/2015 0803   AST 17 09/23/2015 0803   ALT 14 09/23/2015 0803   BILITOT 0.34 09/23/2015 0803       Lab Results  Component Value Date   WBC 1.0 (L) 09/23/2015   HGB 8.6 (L) 09/23/2015   HCT 24.8 (L) 09/23/2015   MCV 98.0 09/23/2015   PLT 112 (L) 09/23/2015   NEUTROABS 0.2 (LL) 09/23/2015     ASSESSMENT & PLAN:  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 Neoadjuvant chemotherapy with dose dense Adriamycin and Cytoxan 4 followed by Abraxane weekly 12 2. Followed by surgery followed by 3. Adjuvant radiation therapy followed by 4. Adjuvant anastrozole 1 mg daily 5-10 years --------------------------------------------------------------------------------------------------------------------------------------- Current treatment: Cycle 4day 1dose dense Adriamycin and Cytoxan, postponing cycle 1/12 Abraxane 2 next week Echocardiogram 07/16/2015 EF 55-60%  Chemotherapy toxicities: 1. Bone pain due to Neulasta: Improved with Claritin. 2.Mild nausea with abdominal cramps: Resolved with Compazine. 3. Palpitations: Wel discontinued dexamethasone orally. 4. Alopecia 5. Fatigue 6. Leukocytosis related to Neulasta 7. Chemotherapy-induced anemia: Will be monitored, hemoglobin 8.9 8. Leukopenia: Due to failure Neulasta with the fourth cycle of Adriamycin and Cytoxan. Neupogen  to be given today and on Friday.  Return to clinic in 1 week for cycle 1 Abraxane.   Orders Placed This Encounter  Procedures  . CBC with Differential    Standing Status:   Standing    Number of Occurrences:   20    Standing Expiration Date:   09/23/2016  . Comprehensive metabolic panel    Standing Status:   Standing    Number of Occurrences:   20    Standing Expiration Date:   09/23/2016  . CBC with Differential [facility])    Standing Status:   Standing    Number of Occurrences:   20    Standing Expiration Date:   09/23/2016  . Comprehensive metabolic panel    Standing Status:   Standing    Number of Occurrences:   20    Standing Expiration Date:   09/23/2016   The patient has a good understanding of the overall plan. she agrees with it. she will call with  any problems that may develop before the next visit here.   Rulon Eisenmenger, MD 09/23/15

## 2015-09-24 ENCOUNTER — Ambulatory Visit (HOSPITAL_BASED_OUTPATIENT_CLINIC_OR_DEPARTMENT_OTHER): Payer: BC Managed Care – PPO

## 2015-09-24 VITALS — BP 120/63 | HR 79 | Temp 98.3°F | Resp 20

## 2015-09-24 DIAGNOSIS — D701 Agranulocytosis secondary to cancer chemotherapy: Secondary | ICD-10-CM | POA: Diagnosis not present

## 2015-09-24 DIAGNOSIS — C50312 Malignant neoplasm of lower-inner quadrant of left female breast: Secondary | ICD-10-CM

## 2015-09-24 MED ORDER — TBO-FILGRASTIM 480 MCG/0.8ML ~~LOC~~ SOSY
480.0000 ug | PREFILLED_SYRINGE | Freq: Once | SUBCUTANEOUS | Status: AC
  Administered 2015-09-24: 480 ug via SUBCUTANEOUS
  Filled 2015-09-24: qty 0.8

## 2015-09-24 NOTE — Patient Instructions (Signed)

## 2015-09-26 ENCOUNTER — Ambulatory Visit (HOSPITAL_BASED_OUTPATIENT_CLINIC_OR_DEPARTMENT_OTHER): Payer: BC Managed Care – PPO

## 2015-09-26 VITALS — BP 132/55 | HR 74 | Temp 98.4°F | Resp 18

## 2015-09-26 DIAGNOSIS — C50312 Malignant neoplasm of lower-inner quadrant of left female breast: Secondary | ICD-10-CM

## 2015-09-26 DIAGNOSIS — D701 Agranulocytosis secondary to cancer chemotherapy: Secondary | ICD-10-CM

## 2015-09-26 MED ORDER — TBO-FILGRASTIM 480 MCG/0.8ML ~~LOC~~ SOSY
480.0000 ug | PREFILLED_SYRINGE | Freq: Once | SUBCUTANEOUS | Status: AC
  Administered 2015-09-26: 480 ug via SUBCUTANEOUS
  Filled 2015-09-26: qty 0.8

## 2015-09-26 NOTE — Patient Instructions (Signed)

## 2015-09-29 ENCOUNTER — Ambulatory Visit
Admission: RE | Admit: 2015-09-29 | Discharge: 2015-09-29 | Disposition: A | Discharge status: Home / Self Care | Payer: BC Managed Care – PPO | Source: Ambulatory Visit | Attending: Family Medicine | Admitting: Family Medicine

## 2015-09-29 DIAGNOSIS — M5440 Lumbago with sciatica, unspecified side: Secondary | ICD-10-CM

## 2015-09-30 ENCOUNTER — Ambulatory Visit (HOSPITAL_BASED_OUTPATIENT_CLINIC_OR_DEPARTMENT_OTHER): Payer: BC Managed Care – PPO | Admitting: Hematology and Oncology

## 2015-09-30 ENCOUNTER — Ambulatory Visit (HOSPITAL_BASED_OUTPATIENT_CLINIC_OR_DEPARTMENT_OTHER): Payer: BC Managed Care – PPO

## 2015-09-30 ENCOUNTER — Other Ambulatory Visit (HOSPITAL_BASED_OUTPATIENT_CLINIC_OR_DEPARTMENT_OTHER): Payer: BC Managed Care – PPO

## 2015-09-30 ENCOUNTER — Encounter: Payer: Self-pay | Admitting: Hematology and Oncology

## 2015-09-30 DIAGNOSIS — C50312 Malignant neoplasm of lower-inner quadrant of left female breast: Secondary | ICD-10-CM | POA: Diagnosis not present

## 2015-09-30 DIAGNOSIS — D72819 Decreased white blood cell count, unspecified: Secondary | ICD-10-CM | POA: Diagnosis not present

## 2015-09-30 DIAGNOSIS — Z5111 Encounter for antineoplastic chemotherapy: Secondary | ICD-10-CM | POA: Diagnosis not present

## 2015-09-30 LAB — COMPREHENSIVE METABOLIC PANEL
ALBUMIN: 3.9 g/dL (ref 3.5–5.0)
ALK PHOS: 100 U/L (ref 40–150)
ALT: 18 U/L (ref 0–55)
ANION GAP: 12 meq/L — AB (ref 3–11)
AST: 20 U/L (ref 5–34)
BILIRUBIN TOTAL: 0.49 mg/dL (ref 0.20–1.20)
BUN: 7.2 mg/dL (ref 7.0–26.0)
CALCIUM: 10.1 mg/dL (ref 8.4–10.4)
CHLORIDE: 102 meq/L (ref 98–109)
CO2: 26 mEq/L (ref 22–29)
CREATININE: 0.7 mg/dL (ref 0.6–1.1)
EGFR: >90 mL/min/{1.73_m2} (ref >90)
Glucose: 102 mg/dl (ref 70–140)
Potassium: 3.9 mEq/L (ref 3.5–5.1)
Sodium: 140 mEq/L (ref 136–145)
Total Protein: 7 g/dL (ref 6.4–8.3)

## 2015-09-30 LAB — CBC WITH DIFFERENTIAL/PLATELET
BASO%: 0.6 % (ref 0.0–2.0)
Basophils Absolute: 0.1 10*3/uL (ref 0.0–0.1)
EOS ABS: 0 10*3/uL (ref 0.0–0.5)
EOS%: 0.4 % (ref 0.0–7.0)
HCT: 32 % — ABNORMAL LOW (ref 34.8–46.6)
HEMOGLOBIN: 10.9 g/dL — AB (ref 11.6–15.9)
LYMPH%: 11.4 % — AB (ref 14.0–49.7)
MCH: 35 pg — ABNORMAL HIGH (ref 25.1–34.0)
MCHC: 34 g/dL (ref 31.5–36.0)
MCV: 102.9 fL — AB (ref 79.5–101.0)
MONO#: 1.4 10*3/uL — ABNORMAL HIGH (ref 0.1–0.9)
MONO%: 15.6 % — AB (ref 0.0–14.0)
NEUT%: 72 % (ref 38.4–76.8)
NEUTROS ABS: 6.5 10*3/uL (ref 1.5–6.5)
Platelets: 339 10*3/uL (ref 145–400)
RBC: 3.11 10*6/uL — AB (ref 3.70–5.45)
RDW: 20.8 % — AB (ref 11.2–14.5)
WBC: 9.1 10*3/uL (ref 3.9–10.3)
lymph#: 1 10*3/uL (ref 0.9–3.3)

## 2015-09-30 MED ORDER — PACLITAXEL PROTEIN-BOUND CHEMO INJECTION 100 MG
80.0000 mg/m2 | Freq: Once | INTRAVENOUS | Status: AC
  Administered 2015-09-30: 125 mg via INTRAVENOUS
  Filled 2015-09-30: qty 25

## 2015-09-30 MED ORDER — SODIUM CHLORIDE 0.9% FLUSH
10.0000 mL | INTRAVENOUS | Status: DC | PRN
  Administered 2015-09-30: 10 mL
  Filled 2015-09-30: qty 10

## 2015-09-30 MED ORDER — SODIUM CHLORIDE 0.9 % IV SOLN
Freq: Once | INTRAVENOUS | Status: AC
  Administered 2015-09-30: 10:00:00 via INTRAVENOUS

## 2015-09-30 MED ORDER — PROCHLORPERAZINE MALEATE 10 MG PO TABS
10.0000 mg | ORAL_TABLET | Freq: Once | ORAL | Status: AC
  Administered 2015-09-30: 10 mg via ORAL

## 2015-09-30 MED ORDER — HEPARIN SOD (PORK) LOCK FLUSH 100 UNIT/ML IV SOLN
500.0000 [IU] | Freq: Once | INTRAVENOUS | Status: AC | PRN
  Administered 2015-09-30: 500 [IU]
  Filled 2015-09-30: qty 5

## 2015-09-30 MED ORDER — PROCHLORPERAZINE MALEATE 10 MG PO TABS
ORAL_TABLET | ORAL | Status: AC
  Filled 2015-09-30: qty 1

## 2015-09-30 NOTE — Progress Notes (Signed)
Pt tolerated Abraxane infusion well. Pt stable at time of discharge.

## 2015-09-30 NOTE — Addendum Note (Signed)
Addended by: [name] on: 09/30/2015 10:09 AM   Modules accepted: Orders

## 2015-09-30 NOTE — Progress Notes (Signed)
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 ([place])   06/25/2015 Initial Diagnosis    Left breast biopsy 8:30 position: IDC grade 1, ER 90%, PR 0%, 50%, HER-2 Negative Ratio 1.21, Irregular Mass Left Breast 1 Cm from Nipple 2.5 x 2 x 2.5 Cm with Distortion, T2 N0 Stage II a Clinical Stage, Oncotype DX score 52, 34% ROR      07/30/2015 -  Neo-Adjuvant Chemotherapy    Neoadjuvant chemotherapy with dose dense Adriamycin and Cytoxan 4 followed by Abraxane weekly 12       CHIEF COMPLIANT: Today's cycle 1 of Abraxane  INTERVAL HISTORY: Kim Ortega is a 60 year old with above-mentioned history of left breast cancer currently on neoadjuvant chemotherapy and we had to delay treatment last week because her blood counts were low. It was felt that it was Neulasta malfunction. She is here today to initiate cycle 1 of Abraxane. The break that she got from chemotherapy has helped her tremendously. Energy levels are markedly better. Taste is coming back. She has good appetite.  REVIEW OF SYSTEMS:   Constitutional: Denies fevers, chills or abnormal weight loss Eyes: Denies blurriness of vision Ears, nose, mouth, throat, and face: Denies mucositis or sore throat Respiratory: Denies cough, dyspnea or wheezes Cardiovascular: Denies palpitation, chest discomfort Gastrointestinal:  Denies nausea, heartburn or change in bowel habits Skin: Denies abnormal skin rashes Lymphatics: Denies new lymphadenopathy or easy bruising Neurological:Denies numbness, tingling or new weaknesses Behavioral/Psych: Mood is stable, no new changes  Extremities: No lower extremity edema All other systems were reviewed with the patient and are negative.  I have reviewed the past medical history, past surgical history, social history and family history with the patient and they are unchanged from  previous note.  ALLERGIES:  has No Known Allergies.  MEDICATIONS:  Current Outpatient Prescriptions  Medication Sig Dispense Refill  . ALPRAZolam (XANAX) 0.5 MG tablet Take 0.5 mg by mouth 3 (three) times daily as needed for anxiety.    [name] anastrozole (ARIMIDEX) 1 MG tablet Take 1 tablet (1 mg total) by mouth daily. 30 tablet 0  . aspirin EC 81 MG tablet Take 81 mg by mouth daily.    . bisoprolol-hydrochlorothiazide (ZIAC) 10-6.25 MG tablet TAKE 1 TABLET BY MOUTH DAILY. 90 tablet 3  . Calcium Carbonate-Vitamin D (CALCIUM-D PO) Take 1 tablet by mouth 2 (two) times daily.     . cetirizine (ZYRTEC) 10 MG tablet Take 10 mg by mouth daily.    . clobetasol (OLUX) 0.05 % topical foam Apply 1 application topically daily as needed (psoriasis).   2  . desonide (DESOWEN) 0.05 % cream Apply 1 application topically daily. For psoriasis    . diclofenac sodium (VOLTAREN) 1 % GEL Apply 2 g topically 3 (three) times daily as needed (Pain).     . fluticasone (FLONASE) 50 MCG/ACT nasal spray INHALE 2 SPRAYS IN EACH NOSTRIL DAILY 16 g 11  . HYDROcodone-acetaminophen (NORCO) 10-325 MG tablet Take 1 tablet by mouth every 6 (six) hours as needed. 60 tablet 0  . ibuprofen (ADVIL,MOTRIN) 200 MG tablet Take 400 mg by mouth every 6 (six) hours as needed for headache (pain).    . Ketotifen Fumarate (ALLERGY EYE DROPS OP) Place 1 drop into both eyes 2 (two) times daily as needed (allergies).     . losartan (COZAAR) 50 MG tablet TAKE 1 TABLET BY MOUTH  ONCE DAILY. 90 tablet 3  . Multiple Vitamin (MULTIVITAMIN WITH MINERALS) TABS tablet Take 1 tablet by mouth daily.    . ondansetron (ZOFRAN) 8 MG tablet Take 1 tablet (8 mg) twice daily as needed for nausea/vomiting 60 tablet 2  . tiZANidine (ZANAFLEX) 4 MG tablet Take 4 mg by mouth at bedtime as needed (back pain).   2   No current facility-administered medications for this visit.     PHYSICAL EXAMINATION: ECOG PERFORMANCE STATUS: 1 - Symptomatic but completely  ambulatory  Vitals:   09/30/15 0817  BP: 122/60  Pulse: 72  Resp: 18  Temp: 98.3 F (36.8 C)   Filed Weights   09/30/15 0817  Weight: 123 lb 11.2 oz (56.1 kg)    GENERAL:alert, no distress and comfortable SKIN: skin color, texture, turgor are normal, no rashes or significant lesions EYES: normal, Conjunctiva are pink and non-injected, sclera clear OROPHARYNX:no exudate, no erythema and lips, buccal mucosa, and tongue normal  NECK: supple, thyroid normal size, non-tender, without nodularity LYMPH:  no palpable lymphadenopathy in the cervical, axillary or inguinal LUNGS: clear to auscultation and percussion with normal breathing effort HEART: regular rate & rhythm and no murmurs and no lower extremity edema ABDOMEN:abdomen soft, non-tender and normal bowel sounds MUSCULOSKELETAL:no cyanosis of digits and no clubbing  NEURO: alert & oriented x 3 with fluent speech, no focal motor/sensory deficits EXTREMITIES: No lower extremity edema  LABORATORY DATA:  I have reviewed the data as listed   Chemistry      Component Value Date/Time   NA 140 09/23/2015 0803   K 4.0 09/23/2015 0803   CL 104 07/21/2015 1500   CO2 26 09/23/2015 0803   BUN 6.8 (L) 09/23/2015 0803   CREATININE 0.7 09/23/2015 0803      Component Value Date/Time   CALCIUM 9.7 09/23/2015 0803   ALKPHOS 72 09/23/2015 0803   AST 17 09/23/2015 0803   ALT 14 09/23/2015 0803   BILITOT 0.34 09/23/2015 0803       Lab Results  Component Value Date   WBC 1.0 (L) 09/23/2015   HGB 8.6 (L) 09/23/2015   HCT 24.8 (L) 09/23/2015   MCV 98.0 09/23/2015   PLT 112 (L) 09/23/2015   NEUTROABS 0.2 (LL) 09/23/2015     ASSESSMENT & PLAN:  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 Neoadjuvant chemotherapy with dose dense Adriamycin and Cytoxan 4 followed by Abraxane weekly 12 2. Followed by surgery followed by 3. Adjuvant radiation therapy followed by 4. Adjuvant anastrozole 1 mg daily 5-10 years --------------------------------------------------------------------------------------------------------------------------------------- Current treatment: Cycle 4day 1dose dense Adriamycin and Cytoxan, Today is cycle 1/12 Abraxane  Echocardiogram 07/16/2015 EF 55-60%  Chemotherapy toxicities: 1. Bone pain due to Neulasta: Improved with Claritin. 2.Mild nausea with abdominal cramps: Resolved with Compazine. 3. Palpitations: We discontinueddexamethasone orally. 4. Alopecia 5. Fatigue 6. Leukocytosis related to Neulasta 7. Chemotherapy-induced anemia: Will be monitored, hemoglobin 8.9 8. Leukopenia: Due to failure Neulasta with the fourth cycle of Adriamycin and Cytoxan.  Return to clinic in 1 week for cycle 2 Abraxane.   No orders of the defined types were placed in this encounter.  The patient has a good  understanding of the overall plan. she agrees with it. she will call with any problems that may develop before the next visit here.   Rulon Eisenmenger, MD 09/30/15

## 2015-09-30 NOTE — Assessment & Plan Note (Signed)
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 Neoadjuvant chemotherapy with dose dense Adriamycin and Cytoxan 4 followed by Abraxane weekly 12 2. Followed by surgery followed by 3. Adjuvant radiation therapy followed by 4. Adjuvant anastrozole 1 mg daily 5-10 years --------------------------------------------------------------------------------------------------------------------------------------- Current treatment: Cycle 4day 1dose dense Adriamycin and Cytoxan, Today is cycle 1/12 Abraxane  Echocardiogram 07/16/2015 EF 55-60%  Chemotherapy toxicities: 1. Bone pain due to Neulasta: Improved with Claritin. 2.Mild nausea with abdominal cramps: Resolved with Compazine. 3. Palpitations: Wel discontinueddexamethasone orally. 4. Alopecia 5. Fatigue 6. Leukocytosis related to Neulasta 7. Chemotherapy-induced anemia: Will be monitored, hemoglobin 8.9 8. Leukopenia: Due to failure Neulasta with the fourth cycle of Adriamycin and Cytoxan. Neupogen to be given today and on Friday.  Return to clinic in 1 week for cycle 2 Abraxane.

## 2015-09-30 NOTE — Patient Instructions (Signed)
[facility] Discharge Instructions for Patients Receiving Chemotherapy  Today you received the following chemotherapy agents, Abraxane  To help prevent nausea and vomiting after your treatment, we encourage you to take your nausea medication as directed.    If you develop nausea and vomiting that is not controlled by your nausea medication, call the clinic.   BELOW ARE SYMPTOMS THAT SHOULD BE REPORTED IMMEDIATELY:  *FEVER GREATER THAN 100.5 F  *CHILLS WITH OR WITHOUT FEVER  NAUSEA AND VOMITING THAT IS NOT CONTROLLED WITH YOUR NAUSEA MEDICATION  *UNUSUAL SHORTNESS OF BREATH  *UNUSUAL BRUISING OR BLEEDING  TENDERNESS IN MOUTH AND THROAT WITH OR WITHOUT PRESENCE OF ULCERS  *URINARY PROBLEMS  *BOWEL PROBLEMS  UNUSUAL RASH Items with * indicate a potential emergency and should be followed up as soon as possible.  Feel free to call the clinic you have any questions or concerns. The clinic phone number is [phone].  Please show the [facility] at check-in to the Emergency Department and triage nurse.   Nanoparticle Albumin-Bound Paclitaxel injection What is this medicine? NANOPARTICLE ALBUMIN-BOUND PACLITAXEL (Na no PAHR ti kuhl al BYOO muhn-bound PAK li TAX el) is a chemotherapy drug. It targets fast dividing cells, like cancer cells, and causes these cells to die. This medicine is used to treat advanced breast cancer and advanced lung cancer. This medicine may be used for other purposes; ask your health care provider or pharmacist if you have questions. What should I tell my health care provider before I take this medicine? They need to know if you have any of these conditions: -kidney disease -liver disease -low blood counts, like low platelets, red blood cells, or white blood cells -recent or ongoing radiation therapy -an unusual or allergic reaction to paclitaxel, albumin, other chemotherapy, other medicines, foods, dyes, or preservatives -pregnant  or trying to get pregnant -breast-feeding How should I use this medicine? This drug is given as an infusion into a vein. It is administered in a hospital or clinic by a specially trained health care professional. Talk to your pediatrician regarding the use of this medicine in children. Special care may be needed. Overdosage: If you think you have taken too much of this medicine contact a poison control center or emergency room at once. NOTE: This medicine is only for you. Do not share this medicine with others. What if I miss a dose? It is important not to miss your dose. Call your doctor or health care professional if you are unable to keep an appointment. What may interact with this medicine? -cyclosporine -diazepam -ketoconazole -medicines to increase blood counts like filgrastim, pegfilgrastim, sargramostim -other chemotherapy drugs like cisplatin, doxorubicin, epirubicin, etoposide, teniposide, vincristine -quinidine -testosterone -vaccines -verapamil Talk to your doctor or health care professional before taking any of these medicines: -acetaminophen -aspirin -ibuprofen -ketoprofen -naproxen This list may not describe all possible interactions. Give your health care provider a list of all the medicines, herbs, non-prescription drugs, or dietary supplements you use. Also tell them if you smoke, drink alcohol, or use illegal drugs. Some items may interact with your medicine. What should I watch for while using this medicine? Your condition will be monitored carefully while you are receiving this medicine. You will need important blood work done while you are taking this medicine. This drug may make you feel generally unwell. This is not uncommon, as chemotherapy can affect healthy cells as well as cancer cells. Report any side effects. Continue your course of treatment even though you  feel ill unless your doctor tells you to stop. In some cases, you may be given additional medicines  to help with side effects. Follow all directions for their use. Call your doctor or health care professional for advice if you get a fever, chills or sore throat, or other symptoms of a cold or flu. Do not treat yourself. This drug decreases your body's ability to fight infections. Try to avoid being around people who are sick. This medicine may increase your risk to bruise or bleed. Call your doctor or health care professional if you notice any unusual bleeding. Be careful brushing and flossing your teeth or using a toothpick because you may get an infection or bleed more easily. If you have any dental work done, tell your dentist you are receiving this medicine. Avoid taking products that contain aspirin, acetaminophen, ibuprofen, naproxen, or ketoprofen unless instructed by your doctor. These medicines may hide a fever. Do not become pregnant while taking this medicine. Women should inform their doctor if they wish to become pregnant or think they might be pregnant. There is a potential for serious side effects to an unborn child. Talk to your health care professional or pharmacist for more information. Do not breast-feed an infant while taking this medicine. Men are advised not to father a child while receiving this medicine. What side effects may I notice from receiving this medicine? Side effects that you should report to your doctor or health care professional as soon as possible: -allergic reactions like skin rash, itching or hives, swelling of the face, lips, or tongue -low blood counts - This drug may decrease the number of white blood cells, red blood cells and platelets. You may be at increased risk for infections and bleeding. -signs of infection - fever or chills, cough, sore throat, pain or difficulty passing urine -signs of decreased platelets or bleeding - bruising, pinpoint red spots on the skin, black, tarry stools, nosebleeds -signs of decreased red blood cells - unusually weak or  tired, fainting spells, lightheadedness -breathing problems -changes in vision -chest pain -high or low blood pressure -mouth sores -nausea and vomiting -pain, swelling, redness or irritation at the injection site -pain, tingling, numbness in the hands or feet -slow or irregular heartbeat -swelling of the ankle, feet, hands Side effects that usually do not require medical attention (report to your doctor or health care professional if they continue or are bothersome): -aches, pains -changes in the color of fingernails -diarrhea -hair loss -loss of appetite This list may not describe all possible side effects. Call your doctor for medical advice about side effects. You may report side effects to FDA at 1-800-FDA-1088. Where should I keep my medicine? This drug is given in a hospital or clinic and will not be stored at home. NOTE: This sheet is a summary. It may not cover all possible information. If you have questions about this medicine, talk to your doctor, pharmacist, or health care provider.    2016, [employer]. (2012-02-14 16:48:50)   

## 2015-10-01 ENCOUNTER — Encounter: Payer: Self-pay | Admitting: Family Medicine

## 2015-10-01 DIAGNOSIS — M5441 Lumbago with sciatica, right side: Secondary | ICD-10-CM

## 2015-10-01 DIAGNOSIS — M5442 Lumbago with sciatica, left side: Principal | ICD-10-CM

## 2015-10-02 NOTE — Telephone Encounter (Signed)
Referral for PT entered.

## 2015-10-06 DIAGNOSIS — C50912 Malignant neoplasm of unspecified site of left female breast: Secondary | ICD-10-CM | POA: Insufficient documentation

## 2015-10-06 DIAGNOSIS — G620 Drug-induced polyneuropathy: Secondary | ICD-10-CM | POA: Insufficient documentation

## 2015-10-06 NOTE — Assessment & Plan Note (Signed)
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 Neoadjuvant chemotherapy with dose dense Adriamycin and Cytoxan 4 followed by Abraxane weekly 12 2. Followed by surgery followed by 3. Adjuvant radiation therapy followed by 4. Adjuvant anastrozole 1 mg daily 5-10 years --------------------------------------------------------------------------------------------------------------------------------------- Current treatment: Cycle 4day 1dose dense Adriamycin and Cytoxan, Today is cycle 2/12 Abraxane  Echocardiogram 07/16/2015 EF 55-60%  Chemotherapy toxicities: 1. Bone pain due to Neulasta: Improved with Claritin. 2.Mild nausea with abdominal cramps: Resolved with Compazine. 3. Palpitations: We discontinueddexamethasone orally. 4. Alopecia 5. Fatigue 6. Leukocytosis related to Neulasta 7. Chemotherapy-induced anemia: Will be monitored, hemoglobin 8.9 8. Leukopenia: Due to failure Neulasta with the fourth cycle of Adriamycin and Cytoxan.  Return to clinic in 2weeks for cycle 4Abraxane.

## 2015-10-07 ENCOUNTER — Encounter: Payer: Self-pay | Admitting: Family Medicine

## 2015-10-07 ENCOUNTER — Ambulatory Visit (INDEPENDENT_AMBULATORY_CARE_PROVIDER_SITE_OTHER): Payer: BC Managed Care – PPO | Admitting: Family Medicine

## 2015-10-07 ENCOUNTER — Other Ambulatory Visit (HOSPITAL_BASED_OUTPATIENT_CLINIC_OR_DEPARTMENT_OTHER): Payer: BC Managed Care – PPO

## 2015-10-07 ENCOUNTER — Ambulatory Visit (HOSPITAL_BASED_OUTPATIENT_CLINIC_OR_DEPARTMENT_OTHER): Payer: BC Managed Care – PPO | Admitting: Hematology and Oncology

## 2015-10-07 ENCOUNTER — Encounter: Payer: Self-pay | Admitting: Hematology and Oncology

## 2015-10-07 ENCOUNTER — Ambulatory Visit (HOSPITAL_BASED_OUTPATIENT_CLINIC_OR_DEPARTMENT_OTHER): Payer: BC Managed Care – PPO

## 2015-10-07 VITALS — BP 120/70 | HR 74 | Temp 98.3°F | Resp 16 | Ht 64.0 in | Wt 121.0 lb

## 2015-10-07 DIAGNOSIS — Z170 Estrogen receptor positive status [ER+]: Principal | ICD-10-CM

## 2015-10-07 DIAGNOSIS — D6481 Anemia due to antineoplastic chemotherapy: Secondary | ICD-10-CM

## 2015-10-07 DIAGNOSIS — C50312 Malignant neoplasm of lower-inner quadrant of left female breast: Secondary | ICD-10-CM

## 2015-10-07 DIAGNOSIS — M545 Low back pain: Secondary | ICD-10-CM

## 2015-10-07 DIAGNOSIS — D72819 Decreased white blood cell count, unspecified: Secondary | ICD-10-CM | POA: Diagnosis not present

## 2015-10-07 DIAGNOSIS — Z5112 Encounter for antineoplastic immunotherapy: Secondary | ICD-10-CM

## 2015-10-07 LAB — COMPREHENSIVE METABOLIC PANEL
ALBUMIN: 4 g/dL (ref 3.5–5.0)
ALK PHOS: 67 U/L (ref 40–150)
ALT: 23 U/L (ref 0–55)
AST: 19 U/L (ref 5–34)
Anion Gap: 11 mEq/L (ref 3–11)
BILIRUBIN TOTAL: 0.45 mg/dL (ref 0.20–1.20)
BUN: 12 mg/dL (ref 7.0–26.0)
CO2: 26 mEq/L (ref 22–29)
Calcium: 9.8 mg/dL (ref 8.4–10.4)
Chloride: 102 mEq/L (ref 98–109)
Creatinine: 0.7 mg/dL (ref 0.6–1.1)
EGFR: >90 mL/min/{1.73_m2} (ref >90)
GLUCOSE: 101 mg/dL (ref 70–140)
POTASSIUM: 3.6 meq/L (ref 3.5–5.1)
SODIUM: 139 meq/L (ref 136–145)
TOTAL PROTEIN: 7 g/dL (ref 6.4–8.3)

## 2015-10-07 LAB — CBC WITH DIFFERENTIAL/PLATELET
BASO%: 0.7 % (ref 0.0–2.0)
Basophils Absolute: 0.1 10*3/uL (ref 0.0–0.1)
EOS%: 0.2 % (ref 0.0–7.0)
Eosinophils Absolute: 0 10*3/uL (ref 0.0–0.5)
HCT: 29.5 % — ABNORMAL LOW (ref 34.8–46.6)
HEMOGLOBIN: 10.1 g/dL — AB (ref 11.6–15.9)
LYMPH%: 17.6 % (ref 14.0–49.7)
MCH: 36 pg — ABNORMAL HIGH (ref 25.1–34.0)
MCHC: 34.3 g/dL (ref 31.5–36.0)
MCV: 105 fL — ABNORMAL HIGH (ref 79.5–101.0)
MONO#: 0.5 10*3/uL (ref 0.1–0.9)
MONO%: 6.9 % (ref 0.0–14.0)
NEUT%: 74.6 % (ref 38.4–76.8)
NEUTROS ABS: 5.7 10*3/uL (ref 1.5–6.5)
Platelets: 248 10*3/uL (ref 145–400)
RBC: 2.81 10*6/uL — ABNORMAL LOW (ref 3.70–5.45)
RDW: 20.7 % — AB (ref 11.2–14.5)
WBC: 7.7 10*3/uL (ref 3.9–10.3)
lymph#: 1.4 10*3/uL (ref 0.9–3.3)

## 2015-10-07 MED ORDER — HEPARIN SOD (PORK) LOCK FLUSH 100 UNIT/ML IV SOLN
500.0000 [IU] | Freq: Once | INTRAVENOUS | Status: AC | PRN
  Administered 2015-10-07: 500 [IU]
  Filled 2015-10-07: qty 5

## 2015-10-07 MED ORDER — PACLITAXEL PROTEIN-BOUND CHEMO INJECTION 100 MG
80.0000 mg/m2 | Freq: Once | INTRAVENOUS | Status: AC
  Administered 2015-10-07: 125 mg via INTRAVENOUS
  Filled 2015-10-07: qty 25

## 2015-10-07 MED ORDER — SODIUM CHLORIDE 0.9 % IV SOLN
Freq: Once | INTRAVENOUS | Status: AC
  Administered 2015-10-07: 10:00:00 via INTRAVENOUS

## 2015-10-07 MED ORDER — HYDROCODONE-ACETAMINOPHEN 10-325 MG PO TABS: 1.0000 | ORAL_TABLET | Freq: Four times a day (QID) | ORAL | 0 refills | Status: DC | PRN

## 2015-10-07 MED ORDER — PROCHLORPERAZINE MALEATE 10 MG PO TABS
ORAL_TABLET | ORAL | Status: AC
  Filled 2015-10-07: qty 1

## 2015-10-07 MED ORDER — PROCHLORPERAZINE MALEATE 10 MG PO TABS
10.0000 mg | ORAL_TABLET | Freq: Once | ORAL | Status: AC
  Administered 2015-10-07: 10 mg via ORAL

## 2015-10-07 MED ORDER — SODIUM CHLORIDE 0.9% FLUSH
10.0000 mL | INTRAVENOUS | Status: DC | PRN
  Administered 2015-10-07: 10 mL
  Filled 2015-10-07: qty 10

## 2015-10-07 NOTE — Patient Instructions (Signed)
[facility] Discharge Instructions for Patients Receiving Chemotherapy  Today you received the following chemotherapy agents, Abraxane  To help prevent nausea and vomiting after your treatment, we encourage you to take your nausea medication as directed.    If you develop nausea and vomiting that is not controlled by your nausea medication, call the clinic.   BELOW ARE SYMPTOMS THAT SHOULD BE REPORTED IMMEDIATELY:  *FEVER GREATER THAN 100.5 F  *CHILLS WITH OR WITHOUT FEVER  NAUSEA AND VOMITING THAT IS NOT CONTROLLED WITH YOUR NAUSEA MEDICATION  *UNUSUAL SHORTNESS OF BREATH  *UNUSUAL BRUISING OR BLEEDING  TENDERNESS IN MOUTH AND THROAT WITH OR WITHOUT PRESENCE OF ULCERS  *URINARY PROBLEMS  *BOWEL PROBLEMS  UNUSUAL RASH Items with * indicate a potential emergency and should be followed up as soon as possible.  Feel free to call the clinic you have any questions or concerns. The clinic phone number is [phone].  Please show the [facility] at check-in to the Emergency Department and triage nurse.

## 2015-10-07 NOTE — Addendum Note (Signed)
Addended by: Jenna Luo on: 10/07/2015 03:52 PM   Modules accepted: Orders

## 2015-10-07 NOTE — Progress Notes (Signed)
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 ([place])   06/25/2015 Initial Diagnosis    Left breast biopsy 8:30 position: IDC grade 1, ER 90%, PR 0%, 50%, HER-2 Negative Ratio 1.21, Irregular Mass Left Breast 1 Cm from Nipple 2.5 x 2 x 2.5 Cm with Distortion, T2 N0 Stage II a Clinical Stage, Oncotype DX score 52, 34% ROR      07/30/2015 -  Neo-Adjuvant Chemotherapy    Neoadjuvant chemotherapy with dose dense Adriamycin and Cytoxan 4 followed by Abraxane weekly 12       CHIEF COMPLIANT: Cycle 2 Abraxane  INTERVAL HISTORY: Kim Ortega is a 60-year-old with above-mentioned history left breast cancer currently on neoadjuvant chemotherapy gray cycle 2 Abraxane. She is tolerating Abraxane moderately well. She did have nausea for which she took Zofran. She did not throw up. She continues to feel fatigued. She is however working full-time as well.  REVIEW OF SYSTEMS:   Constitutional: Denies fevers, chills or abnormal weight loss Eyes: Denies blurriness of vision Ears, nose, mouth, throat, and face: Denies mucositis or sore throat Respiratory: Denies cough, dyspnea or wheezes Cardiovascular: Denies palpitation, chest discomfort Gastrointestinal:  Denies nausea, heartburn or change in bowel habits Skin: Denies abnormal skin rashes Lymphatics: Denies new lymphadenopathy or easy bruising Neurological:Denies numbness, tingling or new weaknesses Behavioral/Psych: Mood is stable, no new changes  Extremities: No lower extremity edema Breast:  denies any pain or lumps or nodules in either breasts All other systems were reviewed with the patient and are negative.  I have reviewed the past medical history, past surgical history, social history and family history with the patient and they are unchanged from previous note.  ALLERGIES:  has No Known Allergies.  MEDICATIONS:    Current Outpatient Prescriptions  Medication Sig Dispense Refill  . ALPRAZolam (XANAX) 0.5 MG tablet Take 0.5 mg by mouth 3 (three) times daily as needed for anxiety.    [name] anastrozole (ARIMIDEX) 1 MG tablet Take 1 tablet (1 mg total) by mouth daily. 30 tablet 0  . aspirin EC 81 MG tablet Take 81 mg by mouth daily.    . bisoprolol-hydrochlorothiazide (ZIAC) 10-6.25 MG tablet TAKE 1 TABLET BY MOUTH DAILY. 90 tablet 3  . Calcium Carbonate-Vitamin D (CALCIUM-D PO) Take 1 tablet by mouth 2 (two) times daily.     . cetirizine (ZYRTEC) 10 MG tablet Take 10 mg by mouth daily.    . clobetasol (OLUX) 0.05 % topical foam Apply 1 application topically daily as needed (psoriasis).   2  . desonide (DESOWEN) 0.05 % cream Apply 1 application topically daily. For psoriasis    . diclofenac sodium (VOLTAREN) 1 % GEL Apply 2 g topically 3 (three) times daily as needed (Pain).     . fluticasone (FLONASE) 50 MCG/ACT nasal spray INHALE 2 SPRAYS IN EACH NOSTRIL DAILY 16 g 11  . HYDROcodone-acetaminophen (NORCO) 10-325 MG tablet Take 1 tablet by mouth every 6 (six) hours as needed. 60 tablet 0  . ibuprofen (ADVIL,MOTRIN) 200 MG tablet Take 400 mg by mouth every 6 (six) hours as needed for headache (pain).    . Ketotifen Fumarate (ALLERGY EYE DROPS OP) Place 1 drop into both eyes 2 (two) times daily as needed (allergies).     . losartan (COZAAR) 50 MG tablet TAKE 1 TABLET BY MOUTH ONCE DAILY. 90 tablet 3  . Multiple Vitamin (MULTIVITAMIN  WITH MINERALS) TABS tablet Take 1 tablet by mouth daily.    . ondansetron (ZOFRAN) 8 MG tablet Take 1 tablet (8 mg) twice daily as needed for nausea/vomiting 60 tablet 2  . tiZANidine (ZANAFLEX) 4 MG tablet Take 4 mg by mouth at bedtime as needed (back pain).   2   No current facility-administered medications for this visit.     PHYSICAL EXAMINATION: ECOG PERFORMANCE STATUS: 1 - Symptomatic but completely ambulatory  Vitals:   10/07/15 0900  BP: 132/73  Pulse: 75  Resp: 18   Temp: 98 F (36.7 C)   Filed Weights   10/07/15 0900  Weight: 124 lb 12.8 oz (56.6 kg)    GENERAL:alert, no distress and comfortable SKIN: skin color, texture, turgor are normal, no rashes or significant lesions EYES: normal, Conjunctiva are pink and non-injected, sclera clear OROPHARYNX:no exudate, no erythema and lips, buccal mucosa, and tongue normal  NECK: supple, thyroid normal size, non-tender, without nodularity LYMPH:  no palpable lymphadenopathy in the cervical, axillary or inguinal LUNGS: clear to auscultation and percussion with normal breathing effort HEART: regular rate & rhythm and no murmurs and no lower extremity edema ABDOMEN:abdomen soft, non-tender and normal bowel sounds MUSCULOSKELETAL:no cyanosis of digits and no clubbing  NEURO: alert & oriented x 3 with fluent speech, no focal motor/sensory deficits EXTREMITIES: No lower extremity edema  LABORATORY DATA:  I have reviewed the data as listed   Chemistry      Component Value Date/Time   NA 140 09/30/2015 0749   K 3.9 09/30/2015 0749   CL 104 07/21/2015 1500   CO2 26 09/30/2015 0749   BUN 7.2 09/30/2015 0749   CREATININE 0.7 09/30/2015 0749      Component Value Date/Time   CALCIUM 10.1 09/30/2015 0749   ALKPHOS 100 09/30/2015 0749   AST 20 09/30/2015 0749   ALT 18 09/30/2015 0749   BILITOT 0.49 09/30/2015 0749       Lab Results  Component Value Date   WBC 7.7 10/07/2015   HGB 10.1 (L) 10/07/2015   HCT 29.5 (L) 10/07/2015   MCV 105.0 (H) 10/07/2015   PLT 248 10/07/2015   NEUTROABS 5.7 10/07/2015     ASSESSMENT & PLAN:  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 Neoadjuvant  chemotherapy with dose dense Adriamycin and Cytoxan 4 followed by Abraxane weekly 12 2. Followed by surgery followed by 3. Adjuvant radiation therapy followed by 4. Adjuvant anastrozole 1 mg daily 5-10 years --------------------------------------------------------------------------------------------------------------------------------------- Current treatment: Cycle 4day 1dose dense Adriamycin and Cytoxan, Today is cycle 2/12 Abraxane  Echocardiogram 07/16/2015 EF 55-60%  Chemotherapy toxicities: 1. Bone pain due to Neulasta: Improved with Claritin. 2.Mild nausea with abdominal cramps: Resolved with Compazine. 3. Palpitations: We discontinueddexamethasone orally. 4. Alopecia 5. Fatigue 6. Leukocytosis related to Neulasta 7. Chemotherapy-induced anemia: Will be monitored, hemoglobin 8.9 8. Leukopenia: Due to failure Neulasta with the fourth cycle of Adriamycin and Cytoxan.  Return to clinic in 2weeks for cycle 4Abraxane.   No orders of the defined types were placed in this encounter.  The patient has a good understanding of the overall plan. she agrees with it. she will call with any problems  that may develop before the next visit here.   Rulon Eisenmenger, MD 10/07/15

## 2015-10-07 NOTE — Progress Notes (Signed)
Subjective:    Patient ID: Kim Ortega, female    DOB: [birth year], 60 y.o.   MRN: [id number]  HPI  06/24/15 The patient reports more than 3 months of severe pain in her lower back. It now seems to be radiating into her left leg. She denies saddle anesthesia or cauda equina syndrome.  However the pain seems to be intensifying. She is tried and failed prednisone and muscle relaxers and has even seen a chiropractor for manipulation and a TENS unit without benefit. She is interested in other options.  At that time, my plan was:  Schedule the patient for an MRI of the lumbar spine is a suspect that she has a herniated disc and may benefit from an epidural steroid injection. I recommended smoking cessation and discontinuation of estrogen given her left carotid stenosis. I will also schedule a carotid Doppler for January 2018.  10/07/15  Patient is here for second round of chemotherapy. Ultimately underwent MRI of the lumbar spine on September 25th.  The results are included below for reference: 1. At L2-3, there is a mild broad-based disc bulge. Mild bilateral lateral recess narrowing. 2. Mild broad-based disc bulges at L3-4 and L4-5.  There was no significant nerve impingement seen on MRI. However the patient continues to have severe low back pain between the levels of L3 and L4 approximately on her exam. There is no radiation of the pain into her legs. He denies any leg weakness. She denies any saddle anesthesia. She denies any cauda equina syndrome. However the pain is constant and severe. She is having to take hydrocodone at least twice a day to manage the pain. She is tried muscle relaxers that make her extremely sleepy. She does not feel that she can tolerate physical therapy at the present time. She is currently undergoing chemotherapy. Having her attend physical therapy in the middle of winter/flu season may be risky for this patient.   Past Medical History:  Diagnosis Date  . Allergy   .  Anxiety    mild no regular meds  . Arthritis    psoriatic arthritis -back  . Breast cancer ([facility]) 06/25/15 bx   left breastinvasive ductal ca ,grade 1  . Episcleritis   . Hormone replacement therapy   . Hypertension   . Left carotid artery stenosis    40-59% (01/2015)  . Psoriasis   . Smoker    Past Surgical History:  Procedure Laterality Date  . ABDOMINAL HYSTERECTOMY    . cervical acdf     C5-6- fusion with plates/screws "Dr. Sherwood Gambler" '12  . COLONOSCOPY WITH PROPOFOL N/A 04/16/2014   Procedure: COLONOSCOPY WITH PROPOFOL;  Surgeon: Garlan Fair, MD;  Location: [facility];  Service: Endoscopy;  Laterality: N/A;  . COLPOSCOPY    . laporotomy     Ovarian cyst  . PORTACATH PLACEMENT N/A 07/24/2015   Procedure: INSERTION PORT-A-CATH WITH [place];  Surgeon: Rolm Bookbinder, MD;  Location: [facility];  Service: General;  Laterality: N/A;  . TONSILLECTOMY     child   Current Outpatient Prescriptions on File Prior to Visit  Medication Sig Dispense Refill  . ALPRAZolam (XANAX) 0.5 MG tablet Take 0.5 mg by mouth 3 (three) times daily as needed for anxiety.    [name] anastrozole (ARIMIDEX) 1 MG tablet Take 1 tablet (1 mg total) by mouth daily. 30 tablet 0  . aspirin EC 81 MG tablet Take 81 mg by mouth daily.    . bisoprolol-hydrochlorothiazide (ZIAC) 10-6.25 MG tablet TAKE 1  TABLET BY MOUTH DAILY. 90 tablet 3  . Calcium Carbonate-Vitamin D (CALCIUM-D PO) Take 1 tablet by mouth 2 (two) times daily.     . cetirizine (ZYRTEC) 10 MG tablet Take 10 mg by mouth daily.    . clobetasol (OLUX) 0.05 % topical foam Apply 1 application topically daily as needed (psoriasis).   2  . desonide (DESOWEN) 0.05 % cream Apply 1 application topically daily. For psoriasis    . diclofenac sodium (VOLTAREN) 1 % GEL Apply 2 g topically 3 (three) times daily as needed (Pain).     . fluticasone (FLONASE) 50 MCG/ACT nasal spray INHALE 2 SPRAYS IN EACH NOSTRIL DAILY 16 g 11  . ibuprofen (ADVIL,MOTRIN) 200 MG tablet Take 400  mg by mouth every 6 (six) hours as needed for headache (pain).    . Ketotifen Fumarate (ALLERGY EYE DROPS OP) Place 1 drop into both eyes 2 (two) times daily as needed (allergies).     . losartan (COZAAR) 50 MG tablet TAKE 1 TABLET BY MOUTH ONCE DAILY. 90 tablet 3  . Multiple Vitamin (MULTIVITAMIN WITH MINERALS) TABS tablet Take 1 tablet by mouth daily.    . ondansetron (ZOFRAN) 8 MG tablet Take 1 tablet (8 mg) twice daily as needed for nausea/vomiting 60 tablet 2  . tiZANidine (ZANAFLEX) 4 MG tablet Take 4 mg by mouth at bedtime as needed (back pain).   2   No current facility-administered medications on file prior to visit.    No Known Allergies Social History   Social History  . Marital status: Married    Spouse name: N/A  . Number of children: N/A  . Years of education: N/A   Occupational History  . Not on file.   Social History Main Topics  . Smoking status: Current Every Day Smoker    Packs/day: 0.50    Years: 20.00    Types: Cigarettes  . Smokeless tobacco: Never Used  . Alcohol use 6.0 oz/week    10 Glasses of wine per week     Comment: Glasses of wine several times a week  . Drug use: No  . Sexual activity: Yes     Comment: married   Other Topics Concern  . Not on file   Social History Narrative  . No narrative on file     Review of Systems  All other systems reviewed and are negative.      Objective:   Physical Exam  Constitutional: She appears well-developed and well-nourished. No distress.  Cardiovascular: Normal rate, regular rhythm and normal heart sounds.   No murmur heard. Pulmonary/Chest: Effort normal and breath sounds normal. No respiratory distress. She has no wheezes. She has no rales.  Abdominal: Soft. Bowel sounds are normal.  Musculoskeletal:       Lumbar back: She exhibits decreased range of motion, tenderness and pain. She exhibits no bony tenderness and no deformity.  Neurological: She has normal reflexes. She exhibits normal muscle  tone. Coordination normal.  Skin: She is not diaphoretic.  Vitals reviewed.         Assessment & Plan:  Midline low back pain, unspecified chronicity, with sciatica presence unspecified - Plan: Ambulatory referral to Orthopedic Surgery Patient would like to speak with Dr. Nelva Bush regarding a possible epidural steroid injection. At the present time, I am not sure that she would truly benefit from epidural steroid injection given the multiple mild bulging discs that she has, due to the fact that there is not one that is more severe  than another.  However I believe it is entirely appropriate for her to speak with him to see if he feels that he can offer her any relief. In the meantime I will continue to prescribe hydrocodone twice a day. I will give the patient 120 tablets to that she is not having to get refills quite as often and risk coming out into medical offices with her suppressed immune system

## 2015-10-14 ENCOUNTER — Ambulatory Visit: Payer: BC Managed Care – PPO | Admitting: Hematology and Oncology

## 2015-10-14 ENCOUNTER — Other Ambulatory Visit (HOSPITAL_BASED_OUTPATIENT_CLINIC_OR_DEPARTMENT_OTHER): Payer: BC Managed Care – PPO

## 2015-10-14 ENCOUNTER — Ambulatory Visit (HOSPITAL_BASED_OUTPATIENT_CLINIC_OR_DEPARTMENT_OTHER): Payer: BC Managed Care – PPO

## 2015-10-14 DIAGNOSIS — C50312 Malignant neoplasm of lower-inner quadrant of left female breast: Secondary | ICD-10-CM | POA: Diagnosis not present

## 2015-10-14 DIAGNOSIS — Z5112 Encounter for antineoplastic immunotherapy: Secondary | ICD-10-CM | POA: Diagnosis not present

## 2015-10-14 DIAGNOSIS — Z170 Estrogen receptor positive status [ER+]: Principal | ICD-10-CM

## 2015-10-14 LAB — CBC WITH DIFFERENTIAL/PLATELET
BASO%: 1 % (ref 0.0–2.0)
Basophils Absolute: 0 10*3/uL (ref 0.0–0.1)
EOS%: 0.7 % (ref 0.0–7.0)
Eosinophils Absolute: 0 10*3/uL (ref 0.0–0.5)
HEMATOCRIT: 29.6 % — AB (ref 34.8–46.6)
HGB: 10.1 g/dL — ABNORMAL LOW (ref 11.6–15.9)
LYMPH#: 0.8 10*3/uL — AB (ref 0.9–3.3)
LYMPH%: 15.9 % (ref 14.0–49.7)
MCH: 36.5 pg — ABNORMAL HIGH (ref 25.1–34.0)
MCHC: 34.1 g/dL (ref 31.5–36.0)
MCV: 107.2 fL — ABNORMAL HIGH (ref 79.5–101.0)
MONO#: 0.6 10*3/uL (ref 0.1–0.9)
MONO%: 11.3 % (ref 0.0–14.0)
NEUT#: 3.5 10*3/uL (ref 1.5–6.5)
NEUT%: 71.1 % (ref 38.4–76.8)
Platelets: 249 10*3/uL (ref 145–400)
RBC: 2.76 10*6/uL — ABNORMAL LOW (ref 3.70–5.45)
RDW: 20.5 % — AB (ref 11.2–14.5)
WBC: 4.9 10*3/uL (ref 3.9–10.3)

## 2015-10-14 LAB — COMPREHENSIVE METABOLIC PANEL
ALT: 30 U/L (ref 0–55)
AST: 29 U/L (ref 5–34)
Albumin: 3.9 g/dL (ref 3.5–5.0)
Alkaline Phosphatase: 71 U/L (ref 40–150)
Anion Gap: 11 mEq/L (ref 3–11)
BUN: 7.2 mg/dL (ref 7.0–26.0)
CALCIUM: 9.8 mg/dL (ref 8.4–10.4)
CHLORIDE: 104 meq/L (ref 98–109)
CO2: 25 meq/L (ref 22–29)
CREATININE: 0.7 mg/dL (ref 0.6–1.1)
EGFR: >90 mL/min/{1.73_m2} (ref >90)
GLUCOSE: 102 mg/dL (ref 70–140)
Potassium: 4 mEq/L (ref 3.5–5.1)
SODIUM: 141 meq/L (ref 136–145)
Total Bilirubin: 0.36 mg/dL (ref 0.20–1.20)
Total Protein: 6.8 g/dL (ref 6.4–8.3)

## 2015-10-14 MED ORDER — PROCHLORPERAZINE MALEATE 10 MG PO TABS
ORAL_TABLET | ORAL | Status: AC
  Filled 2015-10-14: qty 1

## 2015-10-14 MED ORDER — PROCHLORPERAZINE MALEATE 10 MG PO TABS
10.0000 mg | ORAL_TABLET | Freq: Once | ORAL | Status: AC
  Administered 2015-10-14: 10 mg via ORAL

## 2015-10-14 MED ORDER — HEPARIN SOD (PORK) LOCK FLUSH 100 UNIT/ML IV SOLN
500.0000 [IU] | Freq: Once | INTRAVENOUS | Status: AC | PRN
  Administered 2015-10-14: 500 [IU]
  Filled 2015-10-14: qty 5

## 2015-10-14 MED ORDER — PACLITAXEL PROTEIN-BOUND CHEMO INJECTION 100 MG
80.0000 mg/m2 | Freq: Once | INTRAVENOUS | Status: AC
  Administered 2015-10-14: 125 mg via INTRAVENOUS
  Filled 2015-10-14: qty 25

## 2015-10-14 MED ORDER — SODIUM CHLORIDE 0.9% FLUSH
10.0000 mL | INTRAVENOUS | Status: DC | PRN
  Administered 2015-10-14: 10 mL
  Filled 2015-10-14: qty 10

## 2015-10-14 MED ORDER — SODIUM CHLORIDE 0.9 % IV SOLN
Freq: Once | INTRAVENOUS | Status: AC
  Administered 2015-10-14: 10:00:00 via INTRAVENOUS

## 2015-10-14 NOTE — Patient Instructions (Signed)
[facility] Discharge Instructions for Patients Receiving Chemotherapy  Today you received the following chemotherapy agents, Abraxane  To help prevent nausea and vomiting after your treatment, we encourage you to take your nausea medication as directed.    If you develop nausea and vomiting that is not controlled by your nausea medication, call the clinic.   BELOW ARE SYMPTOMS THAT SHOULD BE REPORTED IMMEDIATELY:  *FEVER GREATER THAN 100.5 F  *CHILLS WITH OR WITHOUT FEVER  NAUSEA AND VOMITING THAT IS NOT CONTROLLED WITH YOUR NAUSEA MEDICATION  *UNUSUAL SHORTNESS OF BREATH  *UNUSUAL BRUISING OR BLEEDING  TENDERNESS IN MOUTH AND THROAT WITH OR WITHOUT PRESENCE OF ULCERS  *URINARY PROBLEMS  *BOWEL PROBLEMS  UNUSUAL RASH Items with * indicate a potential emergency and should be followed up as soon as possible.  Feel free to call the clinic you have any questions or concerns. The clinic phone number is [phone].  Please show the [facility] at check-in to the Emergency Department and triage nurse.

## 2015-10-17 ENCOUNTER — Encounter (HOSPITAL_COMMUNITY): Payer: Self-pay

## 2015-10-17 ENCOUNTER — Ambulatory Visit (HOSPITAL_BASED_OUTPATIENT_CLINIC_OR_DEPARTMENT_OTHER)
Admission: RE | Admit: 2015-10-17 | Discharge: 2015-10-17 | Disposition: A | Discharge status: Home / Self Care | Payer: BC Managed Care – PPO | Source: Ambulatory Visit | Attending: Cardiology | Admitting: Cardiology

## 2015-10-17 ENCOUNTER — Ambulatory Visit (HOSPITAL_COMMUNITY)
Admission: RE | Admit: 2015-10-17 | Discharge: 2015-10-17 | Disposition: A | Discharge status: Home / Self Care | Payer: BC Managed Care – PPO | Source: Ambulatory Visit | Attending: Cardiology | Admitting: Cardiology

## 2015-10-17 VITALS — BP 142/80 | HR 73 | Ht 64.0 in | Wt 124.0 lb

## 2015-10-17 DIAGNOSIS — I119 Hypertensive heart disease without heart failure: Secondary | ICD-10-CM | POA: Insufficient documentation

## 2015-10-17 DIAGNOSIS — C50312 Malignant neoplasm of lower-inner quadrant of left female breast: Secondary | ICD-10-CM

## 2015-10-17 DIAGNOSIS — Z170 Estrogen receptor positive status [ER+]: Secondary | ICD-10-CM | POA: Diagnosis not present

## 2015-10-17 DIAGNOSIS — I340 Nonrheumatic mitral (valve) insufficiency: Secondary | ICD-10-CM | POA: Insufficient documentation

## 2015-10-17 DIAGNOSIS — Z720 Tobacco use: Secondary | ICD-10-CM | POA: Diagnosis not present

## 2015-10-17 DIAGNOSIS — F17200 Nicotine dependence, unspecified, uncomplicated: Secondary | ICD-10-CM | POA: Diagnosis not present

## 2015-10-17 DIAGNOSIS — I6522 Occlusion and stenosis of left carotid artery: Secondary | ICD-10-CM | POA: Diagnosis not present

## 2015-10-17 LAB — LIPID PANEL
CHOL/HDL RATIO: 2.2 ratio
CHOLESTEROL: 180 mg/dL (ref 0–200)
HDL: 82 mg/dL (ref >40)
LDL Cholesterol: 88 mg/dL (ref 0–99)
TRIGLYCERIDES: 52 mg/dL (ref <150)
VLDL: 10 mg/dL (ref 0–40)

## 2015-10-17 NOTE — Patient Instructions (Signed)
Labs today  We will contact you in 6 months to schedule your next appointment and echocardiogram  

## 2015-10-17 NOTE — Progress Notes (Signed)
  Echocardiogram 2D Echocardiogram has been performed.  Darlina Sicilian M 10/17/2015, 10:24 AM

## 2015-10-19 NOTE — Progress Notes (Signed)
Oncologist: Dr. Lindi Adie  60 yo with history of smoking, hypertension, and breast cancer presents for cardio-oncology evaluation. She was diagnosed with breast cancer in 6/17, ER+/PR-/HER2-.  Adriamycin + Cytoxan x 4 cycles now completed, followed by abraxane weekly x 12.  She is currently getting her abraxane treatments.  Will have surgery after chemotherapy.   No prior cardiac history.  Recent carotid dopplers did show mild to moderate left internal carotid stenosis. She continues to smoke about 1/2 ppd.  No exertional chest pain or dyspnea.   Labs (1/16): LDL 77  PMH: 1. Carotid stenosis: Carotid dopplers (6/43) with 32-95% LICA stenosis.  2. Low back pain: Lumbar disc disease. 3. HTN 4. Breast cancer: On left.  Diagnosed 6/17, ER+/PR-/HER2-.  Adriamycin + Cytoxan x 4 cycles, followed by abraxane weekly x 12.  Will have surgery after chemotherapy.  - Echo (7/17): EF 55-60%, GLS -18%.  - Echo (10/17): EF 60-65%, moderate diastolic dysfunction, lateral s' 12.4 cm/sec, GLS -21.3%. 5. Smoker  Family History  Problem Relation Age of Onset  . Stroke Mother    Social History   Social History  . Marital status: Married    Spouse name: N/A  . Number of children: N/A  . Years of education: N/A   Social History Main Topics  . Smoking status: Current Every Day Smoker    Packs/day: 0.50    Years: 20.00    Types: Cigarettes  . Smokeless tobacco: Never Used  . Alcohol use 6.0 oz/week    10 Glasses of wine per week     Comment: Glasses of wine several times a week  . Drug use: No  . Sexual activity: Yes     Comment: married   Other Topics Concern  . None   Social History Narrative  . None   ROS: All systems reviewed and negative except as per HPI.   Current Outpatient Prescriptions  Medication Sig Dispense Refill  . ALPRAZolam (XANAX) 0.5 MG tablet Take 0.5 mg by mouth 3 (three) times daily as needed for anxiety.    [name] anastrozole (ARIMIDEX) 1 MG tablet Take 1 tablet (1 mg total)  by mouth daily. 30 tablet 0  . aspirin EC 81 MG tablet Take 81 mg by mouth daily.    . bisoprolol-hydrochlorothiazide (ZIAC) 10-6.25 MG tablet TAKE 1 TABLET BY MOUTH DAILY. 90 tablet 3  . Calcium Carbonate-Vitamin D (CALCIUM-D PO) Take 1 tablet by mouth 2 (two) times daily.     . cetirizine (ZYRTEC) 10 MG tablet Take 10 mg by mouth daily.    . clobetasol (OLUX) 0.05 % topical foam Apply 1 application topically daily as needed (psoriasis).   2  . desonide (DESOWEN) 0.05 % cream Apply 1 application topically daily. For psoriasis    . diclofenac sodium (VOLTAREN) 1 % GEL Apply 2 g topically 3 (three) times daily as needed (Pain).     . fluticasone (FLONASE) 50 MCG/ACT nasal spray INHALE 2 SPRAYS IN EACH NOSTRIL DAILY 16 g 11  . HYDROcodone-acetaminophen (NORCO) 10-325 MG tablet Take 1 tablet by mouth every 6 (six) hours as needed. 120 tablet 0  . ibuprofen (ADVIL,MOTRIN) 200 MG tablet Take 400 mg by mouth every 6 (six) hours as needed for headache (pain).    . Ketotifen Fumarate (ALLERGY EYE DROPS OP) Place 1 drop into both eyes 2 (two) times daily as needed (allergies).     . losartan (COZAAR) 50 MG tablet TAKE 1 TABLET BY MOUTH ONCE DAILY. 90 tablet 3  .  Multiple Vitamin (MULTIVITAMIN WITH MINERALS) TABS tablet Take 1 tablet by mouth daily.    . ondansetron (ZOFRAN) 8 MG tablet Take 1 tablet (8 mg) twice daily as needed for nausea/vomiting 60 tablet 2  . tiZANidine (ZANAFLEX) 4 MG tablet Take 4 mg by mouth at bedtime as needed (back pain).   2   No current facility-administered medications for this encounter.    BP (!) 142/80 (BP Location: Left Arm, Patient Position: Sitting, Cuff Size: Normal)   Pulse 73   Ht 5' 4"  (1.626 m)   Wt 124 lb (56.2 kg)   SpO2 99%   BMI 21.28 kg/m  General: NAD Neck: No JVD, no thyromegaly or thyroid nodule.  Lungs: Clear to auscultation bilaterally with normal respiratory effort. CV: Nondisplaced PMI.  Heart regular S1/S2, no S3/S4, no murmur.  No peripheral  edema.  Soft left carotid bruit.  Normal pedal pulses.  Abdomen: Soft, nontender, no hepatosplenomegaly, no distention.  Skin: Intact without lesions or rashes.  Neurologic: Alert and oriented x 3.  Psych: Normal affect. Extremities: No clubbing or cyanosis.  HEENT: Normal.   Assessment/Plan: 1. Breast cancer: She has completed Adriamycin therapy, now getting abraxane.  Her echo today was reviewed (post-Adriamycin).  No significant changes compared to her pre-chemo echo.  - Return in 6 months for repeat echo to assess for any late Adriamycin toxicity.  2. Carotid stenosis: 75-64% LICA stenosis on carotid dopplers in 1/17. She is on ASA 81.  LDL was low in 1/16. Will repeat lipids today, suggest statin if LDL not < 70.  3. Active smoking: I strongly encouraged her to quit, especially with the presence of vascular disease.   Loralie Champagne 10/19/2015

## 2015-10-20 ENCOUNTER — Encounter (HOSPITAL_COMMUNITY): Payer: BC Managed Care – PPO

## 2015-10-21 ENCOUNTER — Encounter: Payer: Self-pay | Admitting: *Deleted

## 2015-10-21 ENCOUNTER — Other Ambulatory Visit (HOSPITAL_BASED_OUTPATIENT_CLINIC_OR_DEPARTMENT_OTHER): Payer: BC Managed Care – PPO

## 2015-10-21 ENCOUNTER — Ambulatory Visit (HOSPITAL_BASED_OUTPATIENT_CLINIC_OR_DEPARTMENT_OTHER): Payer: BC Managed Care – PPO

## 2015-10-21 ENCOUNTER — Encounter: Payer: Self-pay | Admitting: Hematology and Oncology

## 2015-10-21 ENCOUNTER — Ambulatory Visit (HOSPITAL_BASED_OUTPATIENT_CLINIC_OR_DEPARTMENT_OTHER): Payer: BC Managed Care – PPO | Admitting: Hematology and Oncology

## 2015-10-21 DIAGNOSIS — Z170 Estrogen receptor positive status [ER+]: Principal | ICD-10-CM

## 2015-10-21 DIAGNOSIS — C50312 Malignant neoplasm of lower-inner quadrant of left female breast: Secondary | ICD-10-CM

## 2015-10-21 DIAGNOSIS — D72819 Decreased white blood cell count, unspecified: Secondary | ICD-10-CM

## 2015-10-21 DIAGNOSIS — Z5111 Encounter for antineoplastic chemotherapy: Secondary | ICD-10-CM

## 2015-10-21 DIAGNOSIS — D6481 Anemia due to antineoplastic chemotherapy: Secondary | ICD-10-CM | POA: Diagnosis not present

## 2015-10-21 LAB — COMPREHENSIVE METABOLIC PANEL
ALT: 29 U/L (ref 0–55)
ANION GAP: 12 meq/L — AB (ref 3–11)
AST: 28 U/L (ref 5–34)
Albumin: 3.9 g/dL (ref 3.5–5.0)
Alkaline Phosphatase: 68 U/L (ref 40–150)
BILIRUBIN TOTAL: 0.41 mg/dL (ref 0.20–1.20)
BUN: 6.4 mg/dL — AB (ref 7.0–26.0)
CHLORIDE: 104 meq/L (ref 98–109)
CO2: 25 meq/L (ref 22–29)
CREATININE: 0.7 mg/dL (ref 0.6–1.1)
Calcium: 10.1 mg/dL (ref 8.4–10.4)
EGFR: >90 mL/min/{1.73_m2} (ref >90)
GLUCOSE: 113 mg/dL (ref 70–140)
Potassium: 4 mEq/L (ref 3.5–5.1)
Sodium: 141 mEq/L (ref 136–145)
TOTAL PROTEIN: 6.9 g/dL (ref 6.4–8.3)

## 2015-10-21 LAB — CBC WITH DIFFERENTIAL/PLATELET
BASO%: 1.1 % (ref 0.0–2.0)
Basophils Absolute: 0 10*3/uL (ref 0.0–0.1)
EOS%: 2.8 % (ref 0.0–7.0)
Eosinophils Absolute: 0.1 10*3/uL (ref 0.0–0.5)
HCT: 32.7 % — ABNORMAL LOW (ref 34.8–46.6)
HGB: 11 g/dL — ABNORMAL LOW (ref 11.6–15.9)
LYMPH#: 0.9 10*3/uL (ref 0.9–3.3)
LYMPH%: 23.9 % (ref 14.0–49.7)
MCH: 36.8 pg — ABNORMAL HIGH (ref 25.1–34.0)
MCHC: 33.7 g/dL (ref 31.5–36.0)
MCV: 109.2 fL — ABNORMAL HIGH (ref 79.5–101.0)
MONO#: 0.4 10*3/uL (ref 0.1–0.9)
MONO%: 9.4 % (ref 0.0–14.0)
NEUT%: 62.8 % (ref 38.4–76.8)
NEUTROS ABS: 2.5 10*3/uL (ref 1.5–6.5)
PLATELETS: 327 10*3/uL (ref 145–400)
RBC: 2.99 10*6/uL — AB (ref 3.70–5.45)
RDW: 19.2 % — ABNORMAL HIGH (ref 11.2–14.5)
WBC: 3.9 10*3/uL (ref 3.9–10.3)

## 2015-10-21 MED ORDER — PROCHLORPERAZINE MALEATE 10 MG PO TABS
10.0000 mg | ORAL_TABLET | Freq: Once | ORAL | Status: AC
  Administered 2015-10-21: 10 mg via ORAL

## 2015-10-21 MED ORDER — PROCHLORPERAZINE MALEATE 10 MG PO TABS
ORAL_TABLET | ORAL | Status: AC
  Filled 2015-10-21: qty 1

## 2015-10-21 MED ORDER — HEPARIN SOD (PORK) LOCK FLUSH 100 UNIT/ML IV SOLN
500.0000 [IU] | Freq: Once | INTRAVENOUS | Status: AC | PRN
  Administered 2015-10-21: 500 [IU]
  Filled 2015-10-21: qty 5

## 2015-10-21 MED ORDER — SODIUM CHLORIDE 0.9% FLUSH
10.0000 mL | INTRAVENOUS | Status: DC | PRN
  Administered 2015-10-21: 10 mL
  Filled 2015-10-21: qty 10

## 2015-10-21 MED ORDER — PACLITAXEL PROTEIN-BOUND CHEMO INJECTION 100 MG
80.0000 mg/m2 | Freq: Once | INTRAVENOUS | Status: AC
  Administered 2015-10-21: 125 mg via INTRAVENOUS
  Filled 2015-10-21: qty 25

## 2015-10-21 MED ORDER — SODIUM CHLORIDE 0.9 % IV SOLN
Freq: Once | INTRAVENOUS | Status: AC
  Administered 2015-10-21: 09:00:00 via INTRAVENOUS

## 2015-10-21 NOTE — Progress Notes (Signed)
Patient Care Team: [name], MD as PCP - General (Family Medicine)  DIAGNOSIS:  Encounter Diagnosis  Name Primary?  . Malignant neoplasm of lower-inner quadrant of left breast in female, estrogen receptor positive ([place])     SUMMARY OF ONCOLOGIC HISTORY:   Breast cancer of lower-inner quadrant of left female breast ([place])   06/25/2015 Initial Diagnosis    Left breast biopsy 8:30 position: IDC grade 1, ER 90%, PR 0%, 50%, HER-2 Negative Ratio 1.21, Irregular Mass Left Breast 1 Cm from Nipple 2.5 x 2 x 2.5 Cm with Distortion, T2 N0 Stage II a Clinical Stage, Oncotype DX score 52, 34% ROR      07/30/2015 -  Neo-Adjuvant Chemotherapy    Neoadjuvant chemotherapy with dose dense Adriamycin and Cytoxan 4 followed by Abraxane weekly 12       CHIEF COMPLIANT: Cycle 4 Abraxane  INTERVAL HISTORY: Kim Ortega is a 60-year-old with above-mentioned history left breast cancer currently on neoadjuvant chemotherapy and today is cycle 4 of Abraxane. She is tolerating Abraxane extremely well. She has very occasional tingling in one of the fingers. She does not have any numbness. She does have fatigue and sometimes she has to come home early from work to rest of your denies any fevers or chills. Denies any nausea vomiting. She has excellent taste and is able to eat normal food.  REVIEW OF SYSTEMS:   Constitutional: Denies fevers, chills or abnormal weight loss Eyes: Denies blurriness of vision Ears, nose, mouth, throat, and face: Denies mucositis or sore throat Respiratory: Denies cough, dyspnea or wheezes Cardiovascular: Denies palpitation, chest discomfort Gastrointestinal:  Denies nausea, heartburn or change in bowel habits Skin: Denies abnormal skin rashes Lymphatics: Denies new lymphadenopathy or easy bruising Neurological:Denies numbness, tingling or new weaknesses Behavioral/Psych: Mood is stable, no new changes  Extremities: No lower extremity edema Breast:  denies any pain or  lumps or nodules in either breasts All other systems were reviewed with the patient and are negative.  I have reviewed the past medical history, past surgical history, social history and family history with the patient and they are unchanged from previous note.  ALLERGIES:  has No Known Allergies.  MEDICATIONS:  Current Outpatient Prescriptions  Medication Sig Dispense Refill  . ALPRAZolam (XANAX) 0.5 MG tablet Take 0.5 mg by mouth 3 (three) times daily as needed for anxiety.    [name] anastrozole (ARIMIDEX) 1 MG tablet Take 1 tablet (1 mg total) by mouth daily. 30 tablet 0  . aspirin EC 81 MG tablet Take 81 mg by mouth daily.    . bisoprolol-hydrochlorothiazide (ZIAC) 10-6.25 MG tablet TAKE 1 TABLET BY MOUTH DAILY. 90 tablet 3  . Calcium Carbonate-Vitamin D (CALCIUM-D PO) Take 1 tablet by mouth 2 (two) times daily.     . cetirizine (ZYRTEC) 10 MG tablet Take 10 mg by mouth daily.    . clobetasol (OLUX) 0.05 % topical foam Apply 1 application topically daily as needed (psoriasis).   2  . desonide (DESOWEN) 0.05 % cream Apply 1 application topically daily. For psoriasis    . diclofenac sodium (VOLTAREN) 1 % GEL Apply 2 g topically 3 (three) times daily as needed (Pain).     . fluticasone (FLONASE) 50 MCG/ACT nasal spray INHALE 2 SPRAYS IN EACH NOSTRIL DAILY 16 g 11  . HYDROcodone-acetaminophen (NORCO) 10-325 MG tablet Take 1 tablet by mouth every 6 (six) hours as needed. 120 tablet 0  . ibuprofen (ADVIL,MOTRIN) 200 MG tablet Take 400 mg by mouth  every 6 (six) hours as needed for headache (pain).    . Ketotifen Fumarate (ALLERGY EYE DROPS OP) Place 1 drop into both eyes 2 (two) times daily as needed (allergies).     . losartan (COZAAR) 50 MG tablet TAKE 1 TABLET BY MOUTH ONCE DAILY. 90 tablet 3  . Multiple Vitamin (MULTIVITAMIN WITH MINERALS) TABS tablet Take 1 tablet by mouth daily.    . ondansetron (ZOFRAN) 8 MG tablet Take 1 tablet (8 mg) twice daily as needed for nausea/vomiting 60 tablet 2  .  tiZANidine (ZANAFLEX) 4 MG tablet Take 4 mg by mouth at bedtime as needed (back pain).   2   No current facility-administered medications for this visit.     PHYSICAL EXAMINATION: ECOG PERFORMANCE STATUS: 1 - Symptomatic but completely ambulatory  Vitals:   10/21/15 0817  BP: (!) 147/74  Pulse: 70  Resp: 18  Temp: 97.8 F (36.6 C)   Filed Weights   10/21/15 0817  Weight: 124 lb 8 oz (56.5 kg)    GENERAL:alert, no distress and comfortable SKIN: skin color, texture, turgor are normal, no rashes or significant lesions EYES: normal, Conjunctiva are pink and non-injected, sclera clear OROPHARYNX:no exudate, no erythema and lips, buccal mucosa, and tongue normal  NECK: supple, thyroid normal size, non-tender, without nodularity LYMPH:  no palpable lymphadenopathy in the cervical, axillary or inguinal LUNGS: clear to auscultation and percussion with normal breathing effort HEART: regular rate & rhythm and no murmurs and no lower extremity edema ABDOMEN:abdomen soft, non-tender and normal bowel sounds MUSCULOSKELETAL:no cyanosis of digits and no clubbing  NEURO: alert & oriented x 3 with fluent speech, no focal motor/sensory deficits EXTREMITIES: No lower extremity edema BREAST: No palpable masses or nodules in either right or left breasts. No palpable axillary supraclavicular or infraclavicular adenopathy no breast tenderness or nipple discharge. (exam performed in the presence of a chaperone)  LABORATORY DATA:  I have reviewed the data as listed   Chemistry      Component Value Date/Time   NA 141 10/14/2015 0836   K 4.0 10/14/2015 0836   CL 104 07/21/2015 1500   CO2 25 10/14/2015 0836   BUN 7.2 10/14/2015 0836   CREATININE 0.7 10/14/2015 0836      Component Value Date/Time   CALCIUM 9.8 10/14/2015 0836   ALKPHOS 71 10/14/2015 0836   AST 29 10/14/2015 0836   ALT 30 10/14/2015 0836   BILITOT 0.36 10/14/2015 0836       Lab Results  Component Value Date   WBC 3.9  10/21/2015   HGB 11.0 (L) 10/21/2015   HCT 32.7 (L) 10/21/2015   MCV 109.2 (H) 10/21/2015   PLT 327 10/21/2015   NEUTROABS 2.5 10/21/2015     ASSESSMENT & PLAN:  Breast cancer of lower-inner quadrant of left female breast ([place]) Left breast biopsy 8:30 position: IDC grade 1, ER 90%, PR 0%, 50%, HER-2 Negative Ratio 1.21, Irregular Mass Left Breast 1 Cm from Nipple 2.5 x 2 x 2.5 Cm with Distortion,  T2 N0 Stage II a Clinical Stage Oncotype DX score 52, 34% risk of recurrence without chemotherapy  Treatment plan: 1. Neoadjuvant chemotherapy with dose dense Adriamycin and Cytoxan 4 followed by Abraxane weekly 12 2. Followed by surgery followed by 3. Adjuvant radiation therapy followed by 4. Adjuvant anastrozole 1 mg daily 5-10 years --------------------------------------------------------------------------------------------------------------------------------------- Current treatment: Cycle 4day 1dose dense Adriamycin and Cytoxan, Today is cycle4/12 Abraxane  Echocardiogram 07/16/2015 EF 55-60%  Chemotherapy toxicities: 1. Bone pain due to Neulasta:  Improved with Claritin. 2.Mild nausea with abdominal cramps: Resolved with Compazine. 3. Palpitations: We discontinueddexamethasone orally. 4. Alopecia 5. Fatigue 6. Leukocytosis related to Neulasta 7. Chemotherapy-induced anemia: Will be monitored, hemoglobin 8.9 8. Leukopenia: Due to failure Neulasta with the fourth cycle of Adriamycin and Cytoxan.  Return to clinic in 2weeks for cycle 6Abraxane.   No orders of the defined types were placed in this encounter.  The patient has a good understanding of the overall plan. she agrees with it. she will call with any problems that may develop before the next visit here.   Rulon Eisenmenger, MD 10/21/15

## 2015-10-21 NOTE — Patient Instructions (Signed)
[facility] Discharge Instructions for Patients Receiving Chemotherapy  Today you received the following chemotherapy agents: Abraxane   To help prevent nausea and vomiting after your treatment, we encourage you to take your nausea medication as directed.    If you develop nausea and vomiting that is not controlled by your nausea medication, call the clinic.   BELOW ARE SYMPTOMS THAT SHOULD BE REPORTED IMMEDIATELY:  *FEVER GREATER THAN 100.5 F  *CHILLS WITH OR WITHOUT FEVER  NAUSEA AND VOMITING THAT IS NOT CONTROLLED WITH YOUR NAUSEA MEDICATION  *UNUSUAL SHORTNESS OF BREATH  *UNUSUAL BRUISING OR BLEEDING  TENDERNESS IN MOUTH AND THROAT WITH OR WITHOUT PRESENCE OF ULCERS  *URINARY PROBLEMS  *BOWEL PROBLEMS  UNUSUAL RASH Items with * indicate a potential emergency and should be followed up as soon as possible.  Feel free to call the clinic you have any questions or concerns. The clinic phone number is [phone].  Please show the CHEMO ALERT CARD at check-in to the Emergency Department and triage nurse.   

## 2015-10-21 NOTE — Assessment & Plan Note (Signed)
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 Neoadjuvant chemotherapy with dose dense Adriamycin and Cytoxan 4 followed by Abraxane weekly 12 2. Followed by surgery followed by 3. Adjuvant radiation therapy followed by 4. Adjuvant anastrozole 1 mg daily 5-10 years --------------------------------------------------------------------------------------------------------------------------------------- Current treatment: Cycle 4day 1dose dense Adriamycin and Cytoxan, Today is cycle4/12 Abraxane  Echocardiogram 07/16/2015 EF 55-60%  Chemotherapy toxicities: 1. Bone pain due to Neulasta: Improved with Claritin. 2.Mild nausea with abdominal cramps: Resolved with Compazine. 3. Palpitations: We discontinueddexamethasone orally. 4. Alopecia 5. Fatigue 6. Leukocytosis related to Neulasta 7. Chemotherapy-induced anemia: Will be monitored, hemoglobin 8.9 8. Leukopenia: Due to failure Neulasta with the fourth cycle of Adriamycin and Cytoxan.  Return to clinic in 2weeks for cycle 6Abraxane.

## 2015-10-28 ENCOUNTER — Encounter (HOSPITAL_COMMUNITY): Payer: Self-pay | Admitting: *Deleted

## 2015-10-28 ENCOUNTER — Ambulatory Visit (HOSPITAL_BASED_OUTPATIENT_CLINIC_OR_DEPARTMENT_OTHER): Payer: BC Managed Care – PPO

## 2015-10-28 ENCOUNTER — Ambulatory Visit: Payer: BC Managed Care – PPO | Admitting: Hematology and Oncology

## 2015-10-28 ENCOUNTER — Telehealth (HOSPITAL_COMMUNITY): Payer: Self-pay | Admitting: *Deleted

## 2015-10-28 ENCOUNTER — Other Ambulatory Visit (HOSPITAL_BASED_OUTPATIENT_CLINIC_OR_DEPARTMENT_OTHER): Payer: BC Managed Care – PPO

## 2015-10-28 VITALS — BP 121/63 | HR 75 | Temp 98.1°F | Resp 18

## 2015-10-28 DIAGNOSIS — C50312 Malignant neoplasm of lower-inner quadrant of left female breast: Secondary | ICD-10-CM | POA: Diagnosis not present

## 2015-10-28 DIAGNOSIS — Z170 Estrogen receptor positive status [ER+]: Principal | ICD-10-CM

## 2015-10-28 DIAGNOSIS — Z5111 Encounter for antineoplastic chemotherapy: Secondary | ICD-10-CM | POA: Diagnosis not present

## 2015-10-28 LAB — CBC WITH DIFFERENTIAL/PLATELET
BASO%: 0.7 % (ref 0.0–2.0)
Basophils Absolute: 0 10*3/uL (ref 0.0–0.1)
EOS%: 0.7 % (ref 0.0–7.0)
Eosinophils Absolute: 0 10*3/uL (ref 0.0–0.5)
HCT: 32.2 % — ABNORMAL LOW (ref 34.8–46.6)
HGB: 11 g/dL — ABNORMAL LOW (ref 11.6–15.9)
LYMPH%: 21 % (ref 14.0–49.7)
MCH: 38 pg — ABNORMAL HIGH (ref 25.1–34.0)
MCHC: 34.3 g/dL (ref 31.5–36.0)
MCV: 111 fL — AB (ref 79.5–101.0)
MONO#: 0.5 10*3/uL (ref 0.1–0.9)
MONO%: 9.2 % (ref 0.0–14.0)
NEUT%: 68.4 % (ref 38.4–76.8)
NEUTROS ABS: 3.9 10*3/uL (ref 1.5–6.5)
Platelets: 268 10*3/uL (ref 145–400)
RBC: 2.9 10*6/uL — AB (ref 3.70–5.45)
RDW: 17.8 % — ABNORMAL HIGH (ref 11.2–14.5)
WBC: 5.8 10*3/uL (ref 3.9–10.3)
lymph#: 1.2 10*3/uL (ref 0.9–3.3)

## 2015-10-28 LAB — COMPREHENSIVE METABOLIC PANEL
ALT: 29 U/L (ref 0–55)
AST: 28 U/L (ref 5–34)
Albumin: 4 g/dL (ref 3.5–5.0)
Alkaline Phosphatase: 62 U/L (ref 40–150)
Anion Gap: 12 mEq/L — ABNORMAL HIGH (ref 3–11)
BILIRUBIN TOTAL: 0.46 mg/dL (ref 0.20–1.20)
BUN: 9.8 mg/dL (ref 7.0–26.0)
CO2: 24 meq/L (ref 22–29)
Calcium: 10 mg/dL (ref 8.4–10.4)
Chloride: 100 mEq/L (ref 98–109)
Creatinine: 0.7 mg/dL (ref 0.6–1.1)
EGFR: 89 mL/min/{1.73_m2} — AB (ref >90)
GLUCOSE: 109 mg/dL (ref 70–140)
Potassium: 4 mEq/L (ref 3.5–5.1)
SODIUM: 136 meq/L (ref 136–145)
TOTAL PROTEIN: 6.8 g/dL (ref 6.4–8.3)

## 2015-10-28 MED ORDER — HEPARIN SOD (PORK) LOCK FLUSH 100 UNIT/ML IV SOLN
500.0000 [IU] | Freq: Once | INTRAVENOUS | Status: AC | PRN
  Administered 2015-10-28: 500 [IU]
  Filled 2015-10-28: qty 5

## 2015-10-28 MED ORDER — PROCHLORPERAZINE MALEATE 10 MG PO TABS
10.0000 mg | ORAL_TABLET | Freq: Once | ORAL | Status: AC
  Administered 2015-10-28: 10 mg via ORAL

## 2015-10-28 MED ORDER — PROCHLORPERAZINE MALEATE 10 MG PO TABS
ORAL_TABLET | ORAL | Status: AC
  Filled 2015-10-28: qty 1

## 2015-10-28 MED ORDER — PACLITAXEL PROTEIN-BOUND CHEMO INJECTION 100 MG
80.0000 mg/m2 | Freq: Once | INTRAVENOUS | Status: AC
  Administered 2015-10-28: 125 mg via INTRAVENOUS
  Filled 2015-10-28: qty 25

## 2015-10-28 MED ORDER — SODIUM CHLORIDE 0.9 % IV SOLN
Freq: Once | INTRAVENOUS | Status: AC
  Administered 2015-10-28: 10:00:00 via INTRAVENOUS

## 2015-10-28 MED ORDER — SODIUM CHLORIDE 0.9% FLUSH
10.0000 mL | INTRAVENOUS | Status: DC | PRN
Start: 2015-10-28 — End: 2015-10-28
  Administered 2015-10-28: 10 mL
  Filled 2015-10-28: qty 10

## 2015-10-28 NOTE — Telephone Encounter (Signed)
Notes Recorded by Harvie Junior, CMA on 10/28/2015 at 3:00 PM EDT Unable to reach patient. Letter mailed.   ------  Notes Recorded by Harvie Junior, CMA on 10/24/2015 at 2:15 PM EDT Left message for patient to call back.   ------  Notes Recorded by Harvie Junior, CMA on 10/23/2015 at 3:32 PM EDT Left message for patient to call back.  ------  Notes Recorded by Patton Salles, RN on 10/20/2015 at 10:44 AM EDT Left message on vm about results and to hold off on cholesterol meds now. ------  Notes Recorded by Larey Dresser, MD on 10/19/2015 at 11:27 PM EDT LDL is not bad. Would hold off on cholesterol med for now.    Ref Range & Units 11d ago 77yr ago 14yr ago   Cholesterol 0 - 200 mg/dL 180  185CM  192CM    Triglycerides <150 mg/dL 52  <150 mg/dL" class="[id number]"> 88  <150 mg/dL" class="[id number]"> 75    HDL >40 mg/dL 82  90R  91R    Total CHOL/HDL Ratio RATIO 2.2  2.1R  2.1R    VLDL 0 - 40 mg/dL 10  18  15     LDL Cholesterol 0 - 99 mg/dL 88  77CM  86CM

## 2015-10-28 NOTE — Telephone Encounter (Signed)
-----   Message from Larey Dresser, MD sent at 10/19/2015 11:27 PM EDT ----- LDL is not bad.  Would hold off on cholesterol med for now.

## 2015-10-28 NOTE — Patient Instructions (Signed)
[facility] Discharge Instructions for Patients Receiving Chemotherapy  Today you received the following chemotherapy agents: Abraxane   To help prevent nausea and vomiting after your treatment, we encourage you to take your nausea medication as directed.    If you develop nausea and vomiting that is not controlled by your nausea medication, call the clinic.   BELOW ARE SYMPTOMS THAT SHOULD BE REPORTED IMMEDIATELY:  *FEVER GREATER THAN 100.5 F  *CHILLS WITH OR WITHOUT FEVER  NAUSEA AND VOMITING THAT IS NOT CONTROLLED WITH YOUR NAUSEA MEDICATION  *UNUSUAL SHORTNESS OF BREATH  *UNUSUAL BRUISING OR BLEEDING  TENDERNESS IN MOUTH AND THROAT WITH OR WITHOUT PRESENCE OF ULCERS  *URINARY PROBLEMS  *BOWEL PROBLEMS  UNUSUAL RASH Items with * indicate a potential emergency and should be followed up as soon as possible.  Feel free to call the clinic you have any questions or concerns. The clinic phone number is [phone].  Please show the CHEMO ALERT CARD at check-in to the Emergency Department and triage nurse.   

## 2015-11-03 NOTE — Telephone Encounter (Signed)
Pt aware of lab results 

## 2015-11-03 NOTE — Assessment & Plan Note (Signed)
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 Neoadjuvant chemotherapy with dose dense Adriamycin and Cytoxan 4 followed by Abraxane weekly 12 2. Followed by surgery followed by 3. Adjuvant radiation therapy followed by 4. Adjuvant anastrozole 1 mg daily 5-10 years --------------------------------------------------------------------------------------------------------------------------------------- Current treatment: Cycle 4day 1dose dense Adriamycin and Cytoxan, Today is cycle6/12 Abraxane  Echocardiogram 07/16/2015 EF 55-60%  Chemotherapy toxicities: 1. Bone pain due to Neulasta: Improved with Claritin. 2.Mild nausea with abdominal cramps: Resolved with Compazine. 3. Palpitations: We discontinueddexamethasone orally. 4. Alopecia 5. Fatigue 6. Leukocytosis related to Neulasta 7. Chemotherapy-induced anemia: Will be monitored, hemoglobin 8.9 8. Leukopenia: Due to failure Neulasta with the fourth cycle of Adriamycin and Cytoxan.  Return to clinic in 2weeksfor cycle 8Abraxane.

## 2015-11-04 ENCOUNTER — Ambulatory Visit (HOSPITAL_BASED_OUTPATIENT_CLINIC_OR_DEPARTMENT_OTHER): Payer: BC Managed Care – PPO | Admitting: Hematology and Oncology

## 2015-11-04 ENCOUNTER — Other Ambulatory Visit (HOSPITAL_BASED_OUTPATIENT_CLINIC_OR_DEPARTMENT_OTHER): Payer: BC Managed Care – PPO

## 2015-11-04 ENCOUNTER — Encounter: Payer: Self-pay | Admitting: Hematology and Oncology

## 2015-11-04 ENCOUNTER — Ambulatory Visit (HOSPITAL_BASED_OUTPATIENT_CLINIC_OR_DEPARTMENT_OTHER): Payer: BC Managed Care – PPO

## 2015-11-04 DIAGNOSIS — D6481 Anemia due to antineoplastic chemotherapy: Secondary | ICD-10-CM | POA: Diagnosis not present

## 2015-11-04 DIAGNOSIS — C50312 Malignant neoplasm of lower-inner quadrant of left female breast: Secondary | ICD-10-CM | POA: Diagnosis not present

## 2015-11-04 DIAGNOSIS — Z170 Estrogen receptor positive status [ER+]: Secondary | ICD-10-CM | POA: Diagnosis not present

## 2015-11-04 DIAGNOSIS — R5383 Other fatigue: Secondary | ICD-10-CM | POA: Diagnosis not present

## 2015-11-04 DIAGNOSIS — Z5111 Encounter for antineoplastic chemotherapy: Secondary | ICD-10-CM | POA: Diagnosis not present

## 2015-11-04 LAB — CBC WITH DIFFERENTIAL/PLATELET
BASO%: 0.9 % (ref 0.0–2.0)
BASOS ABS: 0.1 10*3/uL (ref 0.0–0.1)
EOS ABS: 0 10*3/uL (ref 0.0–0.5)
EOS%: 0.6 % (ref 0.0–7.0)
HEMATOCRIT: 31.2 % — AB (ref 34.8–46.6)
HEMOGLOBIN: 10.7 g/dL — AB (ref 11.6–15.9)
LYMPH#: 1.2 10*3/uL (ref 0.9–3.3)
LYMPH%: 22.9 % (ref 14.0–49.7)
MCH: 38.5 pg — AB (ref 25.1–34.0)
MCHC: 34.3 g/dL (ref 31.5–36.0)
MCV: 112.3 fL — AB (ref 79.5–101.0)
MONO#: 0.6 10*3/uL (ref 0.1–0.9)
MONO%: 10.5 % (ref 0.0–14.0)
NEUT%: 65.1 % (ref 38.4–76.8)
NEUTROS ABS: 3.5 10*3/uL (ref 1.5–6.5)
PLATELETS: 252 10*3/uL (ref 145–400)
RBC: 2.77 10*6/uL — ABNORMAL LOW (ref 3.70–5.45)
RDW: 16 % — AB (ref 11.2–14.5)
WBC: 5.3 10*3/uL (ref 3.9–10.3)

## 2015-11-04 LAB — COMPREHENSIVE METABOLIC PANEL
ALBUMIN: 3.9 g/dL (ref 3.5–5.0)
ALK PHOS: 66 U/L (ref 40–150)
ALT: 22 U/L (ref 0–55)
AST: 23 U/L (ref 5–34)
Anion Gap: 9 mEq/L (ref 3–11)
BILIRUBIN TOTAL: 0.35 mg/dL (ref 0.20–1.20)
BUN: 9.7 mg/dL (ref 7.0–26.0)
CALCIUM: 9.7 mg/dL (ref 8.4–10.4)
CO2: 27 mEq/L (ref 22–29)
Chloride: 101 mEq/L (ref 98–109)
Creatinine: 0.7 mg/dL (ref 0.6–1.1)
Glucose: 103 mg/dl (ref 70–140)
POTASSIUM: 4.4 meq/L (ref 3.5–5.1)
Sodium: 136 mEq/L (ref 136–145)
TOTAL PROTEIN: 6.8 g/dL (ref 6.4–8.3)

## 2015-11-04 MED ORDER — HEPARIN SOD (PORK) LOCK FLUSH 100 UNIT/ML IV SOLN
500.0000 [IU] | Freq: Once | INTRAVENOUS | Status: AC | PRN
  Administered 2015-11-04: 500 [IU]
  Filled 2015-11-04: qty 5

## 2015-11-04 MED ORDER — PROCHLORPERAZINE MALEATE 10 MG PO TABS
ORAL_TABLET | ORAL | Status: AC
  Filled 2015-11-04: qty 1

## 2015-11-04 MED ORDER — SODIUM CHLORIDE 0.9 % IV SOLN
Freq: Once | INTRAVENOUS | Status: AC
  Administered 2015-11-04: 09:00:00 via INTRAVENOUS

## 2015-11-04 MED ORDER — PROCHLORPERAZINE MALEATE 10 MG PO TABS
10.0000 mg | ORAL_TABLET | Freq: Once | ORAL | Status: AC
  Administered 2015-11-04: 10 mg via ORAL

## 2015-11-04 MED ORDER — PACLITAXEL PROTEIN-BOUND CHEMO INJECTION 100 MG
80.0000 mg/m2 | Freq: Once | INTRAVENOUS | Status: AC
  Administered 2015-11-04: 125 mg via INTRAVENOUS
  Filled 2015-11-04: qty 25

## 2015-11-04 MED ORDER — SODIUM CHLORIDE 0.9% FLUSH
10.0000 mL | INTRAVENOUS | Status: DC | PRN
Start: 2015-11-04 — End: 2015-11-04
  Administered 2015-11-04: 10 mL
  Filled 2015-11-04: qty 10

## 2015-11-04 NOTE — Progress Notes (Signed)
Patient Care Team: [name], MD as PCP - General (Family Medicine)  DIAGNOSIS:  Encounter Diagnosis  Name Primary?  . Malignant neoplasm of lower-inner quadrant of left breast in female, estrogen receptor positive ([place])     SUMMARY OF ONCOLOGIC HISTORY:   Breast cancer of lower-inner quadrant of left female breast ([place])   06/25/2015 Initial Diagnosis    Left breast biopsy 8:30 position: IDC grade 1, ER 90%, PR 0%, 50%, HER-2 Negative Ratio 1.21, Irregular Mass Left Breast 1 Cm from Nipple 2.5 x 2 x 2.5 Cm with Distortion, T2 N0 Stage II a Clinical Stage, Oncotype DX score 52, 34% ROR      07/30/2015 -  Neo-Adjuvant Chemotherapy    Neoadjuvant chemotherapy with dose dense Adriamycin and Cytoxan 4 followed by Abraxane weekly 12       CHIEF COMPLIANT: Cycle 6 Abraxane  INTERVAL HISTORY: Kim Ortega is a 20 year with above-mentioned history left breast cancer currently on neoadjuvant chemotherapy today is cycle 6 of Abraxane. She is tolerating Abraxane extremely well. She has noticed the hair is starting to come back. Her taste is fairly good except sometimes it is metallic. She has been eating well. Denies any neuropathy. Denies any nausea vomiting. Denies any fevers or chills. She is still working full-time. She does complain of fatigue but it is up and down. Some days better than the other.  REVIEW OF SYSTEMS:   Constitutional: Denies fevers, chills or abnormal weight loss, complains of fatigue Eyes: Denies blurriness of vision Ears, nose, mouth, throat, and face: Denies mucositis or sore throat Respiratory: Denies cough, dyspnea or wheezes Cardiovascular: Denies palpitation, chest discomfort Gastrointestinal:  Denies nausea, heartburn or change in bowel habits Skin: Denies abnormal skin rashes Lymphatics: Denies new lymphadenopathy or easy bruising Neurological:Denies numbness, tingling or new weaknesses Behavioral/Psych: Mood is stable, no new changes    Extremities: No lower extremity edema Breast:  denies any pain or lumps or nodules in either breasts All other systems were reviewed with the patient and are negative.  I have reviewed the past medical history, past surgical history, social history and family history with the patient and they are unchanged from previous note.  ALLERGIES:  has No Known Allergies.  MEDICATIONS:  Current Outpatient Prescriptions  Medication Sig Dispense Refill  . ALPRAZolam (XANAX) 0.5 MG tablet Take 0.5 mg by mouth 3 (three) times daily as needed for anxiety.    [name] anastrozole (ARIMIDEX) 1 MG tablet Take 1 tablet (1 mg total) by mouth daily. 30 tablet 0  . aspirin EC 81 MG tablet Take 81 mg by mouth daily.    . bisoprolol-hydrochlorothiazide (ZIAC) 10-6.25 MG tablet TAKE 1 TABLET BY MOUTH DAILY. 90 tablet 3  . Calcium Carbonate-Vitamin D (CALCIUM-D PO) Take 1 tablet by mouth 2 (two) times daily.     . cetirizine (ZYRTEC) 10 MG tablet Take 10 mg by mouth daily.    . clobetasol (OLUX) 0.05 % topical foam Apply 1 application topically daily as needed (psoriasis).   2  . desonide (DESOWEN) 0.05 % cream Apply 1 application topically daily. For psoriasis    . diclofenac sodium (VOLTAREN) 1 % GEL Apply 2 g topically 3 (three) times daily as needed (Pain).     . fluticasone (FLONASE) 50 MCG/ACT nasal spray INHALE 2 SPRAYS IN EACH NOSTRIL DAILY 16 g 11  . HYDROcodone-acetaminophen (NORCO) 10-325 MG tablet Take 1 tablet by mouth every 6 (six) hours as needed. 120 tablet 0  . ibuprofen (  ADVIL,MOTRIN) 200 MG tablet Take 400 mg by mouth every 6 (six) hours as needed for headache (pain).    . Ketotifen Fumarate (ALLERGY EYE DROPS OP) Place 1 drop into both eyes 2 (two) times daily as needed (allergies).     . losartan (COZAAR) 50 MG tablet TAKE 1 TABLET BY MOUTH ONCE DAILY. 90 tablet 3  . Multiple Vitamin (MULTIVITAMIN WITH MINERALS) TABS tablet Take 1 tablet by mouth daily.    . ondansetron (ZOFRAN) 8 MG tablet Take 1  tablet (8 mg) twice daily as needed for nausea/vomiting 60 tablet 2  . tiZANidine (ZANAFLEX) 4 MG tablet Take 4 mg by mouth at bedtime as needed (back pain).   2   No current facility-administered medications for this visit.     PHYSICAL EXAMINATION: ECOG PERFORMANCE STATUS: 1 - Symptomatic but completely ambulatory  Vitals:   11/04/15 0825  BP: 135/66  Pulse: 66  Resp: 18  Temp: 97.9 F (36.6 C)   Filed Weights   11/04/15 0825  Weight: 125 lb 8 oz (56.9 kg)    GENERAL:alert, no distress and comfortable SKIN: skin color, texture, turgor are normal, no rashes or significant lesions EYES: normal, Conjunctiva are pink and non-injected, sclera clear OROPHARYNX:no exudate, no erythema and lips, buccal mucosa, and tongue normal  NECK: supple, thyroid normal size, non-tender, without nodularity LYMPH:  no palpable lymphadenopathy in the cervical, axillary or inguinal LUNGS: clear to auscultation and percussion with normal breathing effort HEART: regular rate & rhythm and no murmurs and no lower extremity edema ABDOMEN:abdomen soft, non-tender and normal bowel sounds MUSCULOSKELETAL:no cyanosis of digits and no clubbing  NEURO: alert & oriented x 3 with fluent speech, no focal motor/sensory deficits EXTREMITIES: No lower extremity edema  LABORATORY DATA:  I have reviewed the data as listed   Chemistry      Component Value Date/Time   NA 136 10/28/2015 0905   K 4.0 10/28/2015 0905   CL 104 07/21/2015 1500   CO2 24 10/28/2015 0905   BUN 9.8 10/28/2015 0905   CREATININE 0.7 10/28/2015 0905      Component Value Date/Time   CALCIUM 10.0 10/28/2015 0905   ALKPHOS 62 10/28/2015 0905   AST 28 10/28/2015 0905   ALT 29 10/28/2015 0905   BILITOT 0.46 10/28/2015 0905       Lab Results  Component Value Date   WBC 5.3 11/04/2015   HGB 10.7 (L) 11/04/2015   HCT 31.2 (L) 11/04/2015   MCV 112.3 (H) 11/04/2015   PLT 252 11/04/2015   NEUTROABS 3.5 11/04/2015     ASSESSMENT &  PLAN:  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 Neoadjuvant chemotherapy with dose dense Adriamycin and Cytoxan 4 followed by Abraxane weekly 12 2. Followed by surgery followed by 3. Adjuvant radiation therapy followed by 4. Adjuvant anastrozole 1 mg daily 5-10 years --------------------------------------------------------------------------------------------------------------------------------------- Current treatment: Cycle 4day 1dose dense Adriamycin and Cytoxan, Today is cycle6/12 Abraxane  Echocardiogram 07/16/2015 EF 55-60%  Chemotherapy toxicities: 1. Bone pain due to Neulasta: Improved with Claritin. 2.Mild nausea with abdominal cramps: Resolved with Compazine. 3. Palpitations: We discontinueddexamethasone orally. 4. Alopecia 5. Fatigue 6. Leukocytosis related to Neulasta  7. Chemotherapy-induced anemia: Will be monitored, hemoglobin 8.9 8. Leukopenia: Due to failure Neulasta with the fourth cycle of Adriamycin and Cytoxan.  Return to clinic in 2weeksfor cycle 8Abraxane.   No orders of the defined types were placed in this encounter.  The patient has a good understanding of the overall plan. she agrees with it. she will call with any problems that may develop before the next visit here.   Rulon Eisenmenger, MD 11/04/15

## 2015-11-04 NOTE — Patient Instructions (Signed)
[facility] Discharge Instructions for Patients Receiving Chemotherapy  Today you received the following chemotherapy agents: Abraxane   To help prevent nausea and vomiting after your treatment, we encourage you to take your nausea medication as directed.    If you develop nausea and vomiting that is not controlled by your nausea medication, call the clinic.   BELOW ARE SYMPTOMS THAT SHOULD BE REPORTED IMMEDIATELY:  *FEVER GREATER THAN 100.5 F  *CHILLS WITH OR WITHOUT FEVER  NAUSEA AND VOMITING THAT IS NOT CONTROLLED WITH YOUR NAUSEA MEDICATION  *UNUSUAL SHORTNESS OF BREATH  *UNUSUAL BRUISING OR BLEEDING  TENDERNESS IN MOUTH AND THROAT WITH OR WITHOUT PRESENCE OF ULCERS  *URINARY PROBLEMS  *BOWEL PROBLEMS  UNUSUAL RASH Items with * indicate a potential emergency and should be followed up as soon as possible.  Feel free to call the clinic you have any questions or concerns. The clinic phone number is [phone].  Please show the CHEMO ALERT CARD at check-in to the Emergency Department and triage nurse.   

## 2015-11-11 ENCOUNTER — Other Ambulatory Visit (HOSPITAL_BASED_OUTPATIENT_CLINIC_OR_DEPARTMENT_OTHER): Payer: BC Managed Care – PPO

## 2015-11-11 ENCOUNTER — Ambulatory Visit (HOSPITAL_BASED_OUTPATIENT_CLINIC_OR_DEPARTMENT_OTHER): Payer: BC Managed Care – PPO

## 2015-11-11 VITALS — BP 143/70 | HR 72 | Temp 98.2°F | Resp 16

## 2015-11-11 DIAGNOSIS — C50312 Malignant neoplasm of lower-inner quadrant of left female breast: Secondary | ICD-10-CM

## 2015-11-11 DIAGNOSIS — Z170 Estrogen receptor positive status [ER+]: Principal | ICD-10-CM

## 2015-11-11 DIAGNOSIS — Z5111 Encounter for antineoplastic chemotherapy: Secondary | ICD-10-CM | POA: Diagnosis not present

## 2015-11-11 LAB — COMPREHENSIVE METABOLIC PANEL
ALT: 20 U/L (ref 0–55)
ANION GAP: 10 meq/L (ref 3–11)
AST: 21 U/L (ref 5–34)
Albumin: 3.9 g/dL (ref 3.5–5.0)
Alkaline Phosphatase: 69 U/L (ref 40–150)
BUN: 10.6 mg/dL (ref 7.0–26.0)
CHLORIDE: 101 meq/L (ref 98–109)
CO2: 25 meq/L (ref 22–29)
CREATININE: 0.7 mg/dL (ref 0.6–1.1)
Calcium: 9.6 mg/dL (ref 8.4–10.4)
GLUCOSE: 109 mg/dL (ref 70–140)
Potassium: 4.2 mEq/L (ref 3.5–5.1)
SODIUM: 136 meq/L (ref 136–145)
Total Bilirubin: 0.4 mg/dL (ref 0.20–1.20)
Total Protein: 6.8 g/dL (ref 6.4–8.3)

## 2015-11-11 LAB — CBC WITH DIFFERENTIAL/PLATELET
BASO%: 0.4 % (ref 0.0–2.0)
Basophils Absolute: 0 10*3/uL (ref 0.0–0.1)
EOS%: 0.6 % (ref 0.0–7.0)
Eosinophils Absolute: 0 10*3/uL (ref 0.0–0.5)
HCT: 31.2 % — ABNORMAL LOW (ref 34.8–46.6)
HGB: 10.7 g/dL — ABNORMAL LOW (ref 11.6–15.9)
LYMPH%: 24.6 % (ref 14.0–49.7)
MCH: 37.8 pg — ABNORMAL HIGH (ref 25.1–34.0)
MCHC: 34.3 g/dL (ref 31.5–36.0)
MCV: 110.2 fL — ABNORMAL HIGH (ref 79.5–101.0)
MONO#: 0.4 10*3/uL (ref 0.1–0.9)
MONO%: 8.5 % (ref 0.0–14.0)
NEUT#: 3.4 10*3/uL (ref 1.5–6.5)
NEUT%: 65.9 % (ref 38.4–76.8)
PLATELETS: 281 10*3/uL (ref 145–400)
RBC: 2.83 10*6/uL — AB (ref 3.70–5.45)
RDW: 14 % (ref 11.2–14.5)
WBC: 5.1 10*3/uL (ref 3.9–10.3)
lymph#: 1.3 10*3/uL (ref 0.9–3.3)

## 2015-11-11 MED ORDER — SODIUM CHLORIDE 0.9% FLUSH
10.0000 mL | INTRAVENOUS | Status: DC | PRN
  Administered 2015-11-11: 10 mL
  Filled 2015-11-11: qty 10

## 2015-11-11 MED ORDER — SODIUM CHLORIDE 0.9 % IV SOLN
Freq: Once | INTRAVENOUS | Status: AC
  Administered 2015-11-11: 09:00:00 via INTRAVENOUS

## 2015-11-11 MED ORDER — HEPARIN SOD (PORK) LOCK FLUSH 100 UNIT/ML IV SOLN
500.0000 [IU] | Freq: Once | INTRAVENOUS | Status: AC | PRN
  Administered 2015-11-11: 500 [IU]
  Filled 2015-11-11: qty 5

## 2015-11-11 MED ORDER — PROCHLORPERAZINE MALEATE 10 MG PO TABS
ORAL_TABLET | ORAL | Status: AC
Start: 2015-11-11 — End: 2015-11-11
  Filled 2015-11-11: qty 1

## 2015-11-11 MED ORDER — PACLITAXEL PROTEIN-BOUND CHEMO INJECTION 100 MG
80.0000 mg/m2 | Freq: Once | INTRAVENOUS | Status: AC
  Administered 2015-11-11: 125 mg via INTRAVENOUS
  Filled 2015-11-11: qty 25

## 2015-11-11 MED ORDER — PROCHLORPERAZINE MALEATE 10 MG PO TABS
10.0000 mg | ORAL_TABLET | Freq: Once | ORAL | Status: AC
  Administered 2015-11-11: 10 mg via ORAL

## 2015-11-11 NOTE — Patient Instructions (Signed)
[facility] Discharge Instructions for Patients Receiving Chemotherapy  Today you received the following chemotherapy agents: Abraxane   To help prevent nausea and vomiting after your treatment, we encourage you to take your nausea medication as directed.    If you develop nausea and vomiting that is not controlled by your nausea medication, call the clinic.   BELOW ARE SYMPTOMS THAT SHOULD BE REPORTED IMMEDIATELY:  *FEVER GREATER THAN 100.5 F  *CHILLS WITH OR WITHOUT FEVER  NAUSEA AND VOMITING THAT IS NOT CONTROLLED WITH YOUR NAUSEA MEDICATION  *UNUSUAL SHORTNESS OF BREATH  *UNUSUAL BRUISING OR BLEEDING  TENDERNESS IN MOUTH AND THROAT WITH OR WITHOUT PRESENCE OF ULCERS  *URINARY PROBLEMS  *BOWEL PROBLEMS  UNUSUAL RASH Items with * indicate a potential emergency and should be followed up as soon as possible.  Feel free to call the clinic you have any questions or concerns. The clinic phone number is [phone].  Please show the CHEMO ALERT CARD at check-in to the Emergency Department and triage nurse.   

## 2015-11-18 ENCOUNTER — Ambulatory Visit (HOSPITAL_BASED_OUTPATIENT_CLINIC_OR_DEPARTMENT_OTHER): Payer: BC Managed Care – PPO

## 2015-11-18 ENCOUNTER — Other Ambulatory Visit (HOSPITAL_BASED_OUTPATIENT_CLINIC_OR_DEPARTMENT_OTHER): Payer: BC Managed Care – PPO

## 2015-11-18 VITALS — BP 138/77 | HR 68 | Temp 97.9°F | Resp 16

## 2015-11-18 DIAGNOSIS — Z170 Estrogen receptor positive status [ER+]: Principal | ICD-10-CM

## 2015-11-18 DIAGNOSIS — Z5111 Encounter for antineoplastic chemotherapy: Secondary | ICD-10-CM | POA: Diagnosis not present

## 2015-11-18 DIAGNOSIS — C50312 Malignant neoplasm of lower-inner quadrant of left female breast: Secondary | ICD-10-CM

## 2015-11-18 LAB — COMPREHENSIVE METABOLIC PANEL
ALBUMIN: 4 g/dL (ref 3.5–5.0)
ALK PHOS: 68 U/L (ref 40–150)
ALT: 23 U/L (ref 0–55)
AST: 28 U/L (ref 5–34)
Anion Gap: 11 mEq/L (ref 3–11)
BILIRUBIN TOTAL: 0.39 mg/dL (ref 0.20–1.20)
BUN: 7.7 mg/dL (ref 7.0–26.0)
CO2: 25 meq/L (ref 22–29)
Calcium: 9.8 mg/dL (ref 8.4–10.4)
Chloride: 101 mEq/L (ref 98–109)
Creatinine: 0.7 mg/dL (ref 0.6–1.1)
EGFR: 89 mL/min/{1.73_m2} — AB (ref >90)
GLUCOSE: 107 mg/dL (ref 70–140)
Potassium: 4.1 mEq/L (ref 3.5–5.1)
SODIUM: 137 meq/L (ref 136–145)
TOTAL PROTEIN: 7 g/dL (ref 6.4–8.3)

## 2015-11-18 LAB — CBC WITH DIFFERENTIAL/PLATELET
BASO%: 1 % (ref 0.0–2.0)
Basophils Absolute: 0 10*3/uL (ref 0.0–0.1)
EOS ABS: 0 10*3/uL (ref 0.0–0.5)
EOS%: 1 % (ref 0.0–7.0)
HCT: 34.4 % — ABNORMAL LOW (ref 34.8–46.6)
HEMOGLOBIN: 11.8 g/dL (ref 11.6–15.9)
LYMPH%: 27.3 % (ref 14.0–49.7)
MCH: 38.6 pg — ABNORMAL HIGH (ref 25.1–34.0)
MCHC: 34.5 g/dL (ref 31.5–36.0)
MCV: 111.9 fL — AB (ref 79.5–101.0)
MONO#: 0.5 10*3/uL (ref 0.1–0.9)
MONO%: 11.8 % (ref 0.0–14.0)
NEUT%: 58.9 % (ref 38.4–76.8)
NEUTROS ABS: 2.3 10*3/uL (ref 1.5–6.5)
PLATELETS: 309 10*3/uL (ref 145–400)
RBC: 3.07 10*6/uL — ABNORMAL LOW (ref 3.70–5.45)
RDW: 14.2 % (ref 11.2–14.5)
WBC: 4 10*3/uL (ref 3.9–10.3)
lymph#: 1.1 10*3/uL (ref 0.9–3.3)

## 2015-11-18 MED ORDER — SODIUM CHLORIDE 0.9 % IV SOLN
Freq: Once | INTRAVENOUS | Status: AC
  Administered 2015-11-18: 10:00:00 via INTRAVENOUS

## 2015-11-18 MED ORDER — SODIUM CHLORIDE 0.9% FLUSH
10.0000 mL | INTRAVENOUS | Status: DC | PRN
  Administered 2015-11-18: 10 mL
  Filled 2015-11-18: qty 10

## 2015-11-18 MED ORDER — PACLITAXEL PROTEIN-BOUND CHEMO INJECTION 100 MG
80.0000 mg/m2 | Freq: Once | INTRAVENOUS | Status: AC
  Administered 2015-11-18: 125 mg via INTRAVENOUS
  Filled 2015-11-18: qty 25

## 2015-11-18 MED ORDER — HEPARIN SOD (PORK) LOCK FLUSH 100 UNIT/ML IV SOLN
500.0000 [IU] | Freq: Once | INTRAVENOUS | Status: AC | PRN
  Administered 2015-11-18: 500 [IU]
  Filled 2015-11-18: qty 5

## 2015-11-18 MED ORDER — PROCHLORPERAZINE MALEATE 10 MG PO TABS
10.0000 mg | ORAL_TABLET | Freq: Once | ORAL | Status: AC
  Administered 2015-11-18: 10 mg via ORAL

## 2015-11-18 MED ORDER — PROCHLORPERAZINE MALEATE 10 MG PO TABS
ORAL_TABLET | ORAL | Status: AC
  Filled 2015-11-18: qty 1

## 2015-11-18 NOTE — Patient Instructions (Signed)

## 2015-11-23 NOTE — Assessment & Plan Note (Signed)
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 Neoadjuvant chemotherapy with dose dense Adriamycin and Cytoxan 4 followed by Abraxane weekly 12 2. Followed by surgery followed by 3. Adjuvant radiation therapy followed by 4. Adjuvant anastrozole 1 mg daily 5-10 years --------------------------------------------------------------------------------------------------------------------------------------- Current treatment: Cycle 4day 1dose dense Adriamycin and Cytoxan, Today is cycle8/12 Abraxane  Echocardiogram 07/16/2015 EF 55-60%  Chemotherapy toxicities: 1. Bone pain due to Neulasta: Improved with Claritin. 2.Mild nausea with abdominal cramps: Resolved with Compazine. 3. Palpitations: We discontinueddexamethasone orally. 4. Alopecia 5. Fatigue 6. Leukocytosis related to Neulasta 7. Chemotherapy-induced anemia: Will be monitored, hemoglobin 8.9 8. Leukopenia: Due to failure Neulasta with the fourth cycle of Adriamycin and Cytoxan.  Return to clinic in 2weeksfor cycle 10Abraxane.

## 2015-11-25 ENCOUNTER — Ambulatory Visit (HOSPITAL_BASED_OUTPATIENT_CLINIC_OR_DEPARTMENT_OTHER): Payer: BC Managed Care – PPO | Admitting: Hematology and Oncology

## 2015-11-25 ENCOUNTER — Ambulatory Visit (HOSPITAL_BASED_OUTPATIENT_CLINIC_OR_DEPARTMENT_OTHER): Payer: BC Managed Care – PPO

## 2015-11-25 ENCOUNTER — Encounter: Payer: Self-pay | Admitting: Hematology and Oncology

## 2015-11-25 ENCOUNTER — Other Ambulatory Visit (HOSPITAL_BASED_OUTPATIENT_CLINIC_OR_DEPARTMENT_OTHER): Payer: BC Managed Care – PPO

## 2015-11-25 DIAGNOSIS — C50312 Malignant neoplasm of lower-inner quadrant of left female breast: Secondary | ICD-10-CM

## 2015-11-25 DIAGNOSIS — Z170 Estrogen receptor positive status [ER+]: Secondary | ICD-10-CM | POA: Diagnosis not present

## 2015-11-25 DIAGNOSIS — D6481 Anemia due to antineoplastic chemotherapy: Secondary | ICD-10-CM

## 2015-11-25 DIAGNOSIS — Z5111 Encounter for antineoplastic chemotherapy: Secondary | ICD-10-CM | POA: Diagnosis not present

## 2015-11-25 DIAGNOSIS — G620 Drug-induced polyneuropathy: Secondary | ICD-10-CM | POA: Insufficient documentation

## 2015-11-25 DIAGNOSIS — T451X5A Adverse effect of antineoplastic and immunosuppressive drugs, initial encounter: Secondary | ICD-10-CM

## 2015-11-25 DIAGNOSIS — R530 Neoplastic (malignant) related fatigue: Secondary | ICD-10-CM

## 2015-11-25 LAB — CBC WITH DIFFERENTIAL/PLATELET
BASO%: 0.8 % (ref 0.0–2.0)
BASOS ABS: 0 10*3/uL (ref 0.0–0.1)
EOS ABS: 0 10*3/uL (ref 0.0–0.5)
EOS%: 0.6 % (ref 0.0–7.0)
HCT: 35.7 % (ref 34.8–46.6)
HGB: 12.2 g/dL (ref 11.6–15.9)
LYMPH%: 26.4 % (ref 14.0–49.7)
MCH: 37.8 pg — AB (ref 25.1–34.0)
MCHC: 34.3 g/dL (ref 31.5–36.0)
MCV: 110.3 fL — AB (ref 79.5–101.0)
MONO#: 0.4 10*3/uL (ref 0.1–0.9)
MONO%: 10.8 % (ref 0.0–14.0)
NEUT#: 2.5 10*3/uL (ref 1.5–6.5)
NEUT%: 61.4 % (ref 38.4–76.8)
Platelets: 280 10*3/uL (ref 145–400)
RBC: 3.23 10*6/uL — AB (ref 3.70–5.45)
RDW: 13.6 % (ref 11.2–14.5)
WBC: 4 10*3/uL (ref 3.9–10.3)
lymph#: 1.1 10*3/uL (ref 0.9–3.3)

## 2015-11-25 LAB — COMPREHENSIVE METABOLIC PANEL
ALK PHOS: 63 U/L (ref 40–150)
ALT: 21 U/L (ref 0–55)
AST: 23 U/L (ref 5–34)
Albumin: 4 g/dL (ref 3.5–5.0)
Anion Gap: 11 mEq/L (ref 3–11)
BUN: 10.5 mg/dL (ref 7.0–26.0)
CHLORIDE: 100 meq/L (ref 98–109)
CO2: 27 mEq/L (ref 22–29)
Calcium: 10.1 mg/dL (ref 8.4–10.4)
Creatinine: 0.7 mg/dL (ref 0.6–1.1)
EGFR: 89 mL/min/{1.73_m2} — AB (ref >90)
GLUCOSE: 103 mg/dL (ref 70–140)
Potassium: 4.2 mEq/L (ref 3.5–5.1)
SODIUM: 137 meq/L (ref 136–145)
Total Bilirubin: 0.31 mg/dL (ref 0.20–1.20)
Total Protein: 7 g/dL (ref 6.4–8.3)

## 2015-11-25 MED ORDER — HEPARIN SOD (PORK) LOCK FLUSH 100 UNIT/ML IV SOLN
500.0000 [IU] | Freq: Once | INTRAVENOUS | Status: AC | PRN
  Administered 2015-11-25: 500 [IU]
  Filled 2015-11-25: qty 5

## 2015-11-25 MED ORDER — PROCHLORPERAZINE MALEATE 10 MG PO TABS
ORAL_TABLET | ORAL | Status: AC
  Filled 2015-11-25: qty 1

## 2015-11-25 MED ORDER — PACLITAXEL PROTEIN-BOUND CHEMO INJECTION 100 MG
60.0000 mg/m2 | Freq: Once | INTRAVENOUS | Status: AC
  Administered 2015-11-25: 100 mg via INTRAVENOUS
  Filled 2015-11-25: qty 20

## 2015-11-25 MED ORDER — SODIUM CHLORIDE 0.9% FLUSH
10.0000 mL | INTRAVENOUS | Status: DC | PRN
  Administered 2015-11-25: 10 mL
  Filled 2015-11-25: qty 10

## 2015-11-25 MED ORDER — PROCHLORPERAZINE MALEATE 10 MG PO TABS
10.0000 mg | ORAL_TABLET | Freq: Once | ORAL | Status: AC
  Administered 2015-11-25: 10 mg via ORAL

## 2015-11-25 MED ORDER — SODIUM CHLORIDE 0.9 % IV SOLN
Freq: Once | INTRAVENOUS | Status: AC
  Administered 2015-11-25: 09:00:00 via INTRAVENOUS

## 2015-11-25 NOTE — Progress Notes (Signed)
Patient Care Team: [name], MD as PCP - General (Family Medicine)  DIAGNOSIS:  Encounter Diagnosis  Name Primary?  . Malignant neoplasm of lower-inner quadrant of left breast in female, estrogen receptor positive ([place])     SUMMARY OF ONCOLOGIC HISTORY:   Breast cancer of lower-inner quadrant of left female breast ([place])   06/25/2015 Initial Diagnosis    Left breast biopsy 8:30 position: IDC grade 1, ER 90%, PR 0%, 50%, HER-2 Negative Ratio 1.21, Irregular Mass Left Breast 1 Cm from Nipple 2.5 x 2 x 2.5 Cm with Distortion, T2 N0 Stage II a Clinical Stage, Oncotype DX score 52, 34% ROR      07/30/2015 -  Neo-Adjuvant Chemotherapy    Neoadjuvant chemotherapy with dose dense Adriamycin and Cytoxan 4 followed by Abraxane weekly 12       CHIEF COMPLIANT: Cycle 9 Abraxane  INTERVAL HISTORY: Kim Ortega is a 60 year old with above-mentioned history of left breast cancer currently on neoadjuvant chemotherapy and today is cycle 9 of Abraxane. She complains of fatigue related to chemotherapy. She denies any nausea or vomiting. She denies any fevers or chills. She is complaining of neuropathy in the fingers and toes. She stopped working because of fatigue was becoming too significant.  REVIEW OF SYSTEMS:   Constitutional: Denies fevers, chills or abnormal weight loss Eyes: Denies blurriness of vision Ears, nose, mouth, throat, and face: Denies mucositis or sore throat Respiratory: Denies cough, dyspnea or wheezes Cardiovascular: Denies palpitation, chest discomfort Gastrointestinal:  Denies nausea, heartburn or change in bowel habits Skin: Denies abnormal skin rashes Lymphatics: Denies new lymphadenopathy or easy bruising Neurological: Neuropathy in fingers and toes. Behavioral/Psych: Mood is stable, no new changes  Extremities: No lower extremity edema All other systems were reviewed with the patient and are negative.  I have reviewed the past medical history, past  surgical history, social history and family history with the patient and they are unchanged from previous note.  ALLERGIES:  has No Known Allergies.  MEDICATIONS:  Current Outpatient Prescriptions  Medication Sig Dispense Refill  . ALPRAZolam (XANAX) 0.5 MG tablet Take 0.5 mg by mouth 3 (three) times daily as needed for anxiety.    [name] anastrozole (ARIMIDEX) 1 MG tablet Take 1 tablet (1 mg total) by mouth daily. 30 tablet 0  . aspirin EC 81 MG tablet Take 81 mg by mouth daily.    . bisoprolol-hydrochlorothiazide (ZIAC) 10-6.25 MG tablet TAKE 1 TABLET BY MOUTH DAILY. 90 tablet 3  . Calcium Carbonate-Vitamin D (CALCIUM-D PO) Take 1 tablet by mouth 2 (two) times daily.     . cetirizine (ZYRTEC) 10 MG tablet Take 10 mg by mouth daily.    . clobetasol (OLUX) 0.05 % topical foam Apply 1 application topically daily as needed (psoriasis).   2  . desonide (DESOWEN) 0.05 % cream Apply 1 application topically daily. For psoriasis    . diclofenac sodium (VOLTAREN) 1 % GEL Apply 2 g topically 3 (three) times daily as needed (Pain).     . fluticasone (FLONASE) 50 MCG/ACT nasal spray INHALE 2 SPRAYS IN EACH NOSTRIL DAILY 16 g 11  . HYDROcodone-acetaminophen (NORCO) 10-325 MG tablet Take 1 tablet by mouth every 6 (six) hours as needed. 120 tablet 0  . ibuprofen (ADVIL,MOTRIN) 200 MG tablet Take 400 mg by mouth every 6 (six) hours as needed for headache (pain).    . Ketotifen Fumarate (ALLERGY EYE DROPS OP) Place 1 drop into both eyes 2 (two) times daily as needed (allergies).     [name]  losartan (COZAAR) 50 MG tablet TAKE 1 TABLET BY MOUTH ONCE DAILY. 90 tablet 3  . Multiple Vitamin (MULTIVITAMIN WITH MINERALS) TABS tablet Take 1 tablet by mouth daily.    . ondansetron (ZOFRAN) 8 MG tablet Take 1 tablet (8 mg) twice daily as needed for nausea/vomiting 60 tablet 2  . tiZANidine (ZANAFLEX) 4 MG tablet Take 4 mg by mouth at bedtime as needed (back pain).   2   No current facility-administered medications for this  visit.     PHYSICAL EXAMINATION: ECOG PERFORMANCE STATUS: 1 - Symptomatic but completely ambulatory  There were no vitals filed for this visit. There were no vitals filed for this visit.  GENERAL:alert, no distress and comfortable SKIN: skin color, texture, turgor are normal, no rashes or significant lesions EYES: normal, Conjunctiva are pink and non-injected, sclera clear OROPHARYNX:no exudate, no erythema and lips, buccal mucosa, and tongue normal  NECK: supple, thyroid normal size, non-tender, without nodularity LYMPH:  no palpable lymphadenopathy in the cervical, axillary or inguinal LUNGS: clear to auscultation and percussion with normal breathing effort HEART: regular rate & rhythm and no murmurs and no lower extremity edema ABDOMEN:abdomen soft, non-tender and normal bowel sounds MUSCULOSKELETAL:no cyanosis of digits and no clubbing  NEURO: alert & oriented x 3 with fluent speech, grade 2 peripheral neuropathy EXTREMITIES: No lower extremity edema  LABORATORY DATA:  I have reviewed the data as listed   Chemistry      Component Value Date/Time   NA 137 11/18/2015 0850   K 4.1 11/18/2015 0850   CL 104 07/21/2015 1500   CO2 25 11/18/2015 0850   BUN 7.7 11/18/2015 0850   CREATININE 0.7 11/18/2015 0850      Component Value Date/Time   CALCIUM 9.8 11/18/2015 0850   ALKPHOS 68 11/18/2015 0850   AST 28 11/18/2015 0850   ALT 23 11/18/2015 0850   BILITOT 0.39 11/18/2015 0850       Lab Results  Component Value Date   WBC 4.0 11/18/2015   HGB 11.8 11/18/2015   HCT 34.4 (L) 11/18/2015   MCV 111.9 (H) 11/18/2015   PLT 309 11/18/2015   NEUTROABS 2.3 11/18/2015     ASSESSMENT & PLAN:  Breast cancer of lower-inner quadrant of left female breast (HCC) Left breast biopsy 8:30 position: IDC grade 1, ER 90%, PR 0%, 50%, HER-2 Negative Ratio 1.21, Irregular Mass Left Breast 1 Cm from Nipple 2.5 x 2 x 2.5 Cm with Distortion,  T2 N0 Stage II a Clinical Stage Oncotype DX  score 52, 34% risk of recurrence without chemotherapy  Treatment plan: 1. Neoadjuvant chemotherapy with dose dense Adriamycin and Cytoxan 4 followed by Abraxane weekly 12 2. Followed by surgery followed by 3. Adjuvant radiation therapy followed by 4. Adjuvant anastrozole 1 mg daily 5-10 years --------------------------------------------------------------------------------------------------------------------------------------- Current treatment: Cycle 4day 1dose dense Adriamycin and Cytoxan, Today is cycle9/12 Abraxane  Echocardiogram 07/16/2015 EF 55-60%  Chemotherapy toxicities: 1. Bone pain due to Neulasta: Improved with Claritin. 2.Mild nausea with abdominal cramps: Resolved with Compazine. 3. Palpitations: We discontinueddexamethasone orally. 4. Alopecia 5. Fatigue 6. Leukocytosis related to Neulasta 7. Chemotherapy-induced anemia: Will be monitored, hemoglobin 8.9 8. Leukopenia: Due to failure Neulasta with the fourth cycle of Adriamycin and Cytoxan. 9. Peripheral neuropathy grade 2: I will decrease the dosage of Abraxane to 60 mg/m. We will have to see if she can complete her chemotherapy or we may have to stop treatment sooner for neuropathy.  Return to clinic in 1weeksfor cycle 10Abraxane.   No  orders of the defined types were placed in this encounter.  The patient has a good understanding of the overall plan. she agrees with it. she will call with any problems that may develop before the next visit here.   Rulon Eisenmenger, MD 11/25/15

## 2015-11-25 NOTE — Patient Instructions (Signed)
[facility] Discharge Instructions for Patients Receiving Chemotherapy  Today you received the following chemotherapy agents: Abraxane   To help prevent nausea and vomiting after your treatment, we encourage you to take your nausea medication as directed.    If you develop nausea and vomiting that is not controlled by your nausea medication, call the clinic.   BELOW ARE SYMPTOMS THAT SHOULD BE REPORTED IMMEDIATELY:  *FEVER GREATER THAN 100.5 F  *CHILLS WITH OR WITHOUT FEVER  NAUSEA AND VOMITING THAT IS NOT CONTROLLED WITH YOUR NAUSEA MEDICATION  *UNUSUAL SHORTNESS OF BREATH  *UNUSUAL BRUISING OR BLEEDING  TENDERNESS IN MOUTH AND THROAT WITH OR WITHOUT PRESENCE OF ULCERS  *URINARY PROBLEMS  *BOWEL PROBLEMS  UNUSUAL RASH Items with * indicate a potential emergency and should be followed up as soon as possible.  Feel free to call the clinic you have any questions or concerns. The clinic phone number is [phone].  Please show the CHEMO ALERT CARD at check-in to the Emergency Department and triage nurse.   

## 2015-12-01 NOTE — Assessment & Plan Note (Signed)
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 Neoadjuvant chemotherapy with dose dense Adriamycin and Cytoxan 4 followed by Abraxane weekly 12 2. Followed by surgery followed by 3. Adjuvant radiation therapy followed by 4. Adjuvant anastrozole 1 mg daily 5-10 years --------------------------------------------------------------------------------------------------------------------------------------- Current treatment: Cycle 4day 1dose dense Adriamycin and Cytoxan, Today is cycle10/12 Abraxane  Echocardiogram 07/16/2015 EF 55-60%  Chemotherapy toxicities: 1. Bone pain due to Neulasta: Improved with Claritin. 2.Mild nausea with abdominal cramps: Resolved with Compazine. 3. Palpitations: We discontinueddexamethasone orally. 4. Alopecia 5. Fatigue 6. Leukocytosis related to Neulasta 7. Chemotherapy-induced anemia: Will be monitored, hemoglobin 8.9 8. Leukopenia: Due to failure Neulasta with the fourth cycle of Adriamycin and Cytoxan. 9. Peripheral neuropathy grade 2: I will decrease the dosage of Abraxane to 60 mg/m. We will have to see if she can complete her chemotherapy or we may have to stop treatment sooner for neuropathy.  Return to clinic in 1weeksfor cycle 11Abraxane.

## 2015-12-02 ENCOUNTER — Ambulatory Visit (HOSPITAL_BASED_OUTPATIENT_CLINIC_OR_DEPARTMENT_OTHER): Payer: BC Managed Care – PPO | Admitting: Hematology and Oncology

## 2015-12-02 ENCOUNTER — Other Ambulatory Visit (HOSPITAL_BASED_OUTPATIENT_CLINIC_OR_DEPARTMENT_OTHER): Payer: BC Managed Care – PPO

## 2015-12-02 ENCOUNTER — Ambulatory Visit (HOSPITAL_BASED_OUTPATIENT_CLINIC_OR_DEPARTMENT_OTHER): Payer: BC Managed Care – PPO

## 2015-12-02 ENCOUNTER — Encounter: Payer: Self-pay | Admitting: Hematology and Oncology

## 2015-12-02 DIAGNOSIS — C50312 Malignant neoplasm of lower-inner quadrant of left female breast: Secondary | ICD-10-CM

## 2015-12-02 DIAGNOSIS — G620 Drug-induced polyneuropathy: Secondary | ICD-10-CM

## 2015-12-02 DIAGNOSIS — D6481 Anemia due to antineoplastic chemotherapy: Secondary | ICD-10-CM | POA: Diagnosis not present

## 2015-12-02 DIAGNOSIS — Z5111 Encounter for antineoplastic chemotherapy: Secondary | ICD-10-CM | POA: Diagnosis not present

## 2015-12-02 DIAGNOSIS — Z170 Estrogen receptor positive status [ER+]: Secondary | ICD-10-CM | POA: Diagnosis not present

## 2015-12-02 LAB — COMPREHENSIVE METABOLIC PANEL
ALT: 23 U/L (ref 0–55)
ANION GAP: 8 meq/L (ref 3–11)
AST: 23 U/L (ref 5–34)
Albumin: 3.8 g/dL (ref 3.5–5.0)
Alkaline Phosphatase: 53 U/L (ref 40–150)
BUN: 9.2 mg/dL (ref 7.0–26.0)
CHLORIDE: 100 meq/L (ref 98–109)
CO2: 28 meq/L (ref 22–29)
Calcium: 9.8 mg/dL (ref 8.4–10.4)
Creatinine: 0.8 mg/dL (ref 0.6–1.1)
EGFR: 86 mL/min/{1.73_m2} — AB (ref >90)
Glucose: 102 mg/dl (ref 70–140)
POTASSIUM: 4.2 meq/L (ref 3.5–5.1)
Sodium: 135 mEq/L — ABNORMAL LOW (ref 136–145)
Total Bilirubin: 0.3 mg/dL (ref 0.20–1.20)
Total Protein: 6.6 g/dL (ref 6.4–8.3)

## 2015-12-02 LAB — CBC WITH DIFFERENTIAL/PLATELET
BASO%: 0.4 % (ref 0.0–2.0)
Basophils Absolute: 0 10*3/uL (ref 0.0–0.1)
EOS%: 0.4 % (ref 0.0–7.0)
Eosinophils Absolute: 0 10*3/uL (ref 0.0–0.5)
HCT: 33.6 % — ABNORMAL LOW (ref 34.8–46.6)
HGB: 11.8 g/dL (ref 11.6–15.9)
LYMPH%: 23.4 % (ref 14.0–49.7)
MCH: 37.6 pg — AB (ref 25.1–34.0)
MCHC: 35.1 g/dL (ref 31.5–36.0)
MCV: 107 fL — AB (ref 79.5–101.0)
MONO#: 0.5 10*3/uL (ref 0.1–0.9)
MONO%: 9.8 % (ref 0.0–14.0)
NEUT#: 3.2 10*3/uL (ref 1.5–6.5)
NEUT%: 66 % (ref 38.4–76.8)
PLATELETS: 225 10*3/uL (ref 145–400)
RBC: 3.14 10*6/uL — AB (ref 3.70–5.45)
RDW: 13.5 % (ref 11.2–14.5)
WBC: 4.8 10*3/uL (ref 3.9–10.3)
lymph#: 1.1 10*3/uL (ref 0.9–3.3)

## 2015-12-02 MED ORDER — PACLITAXEL PROTEIN-BOUND CHEMO INJECTION 100 MG
60.0000 mg/m2 | Freq: Once | INTRAVENOUS | Status: AC
  Administered 2015-12-02: 100 mg via INTRAVENOUS
  Filled 2015-12-02: qty 20

## 2015-12-02 MED ORDER — PROCHLORPERAZINE MALEATE 10 MG PO TABS
ORAL_TABLET | ORAL | Status: AC
  Filled 2015-12-02: qty 1

## 2015-12-02 MED ORDER — PROCHLORPERAZINE MALEATE 10 MG PO TABS
10.0000 mg | ORAL_TABLET | Freq: Once | ORAL | Status: AC
  Administered 2015-12-02: 10 mg via ORAL

## 2015-12-02 MED ORDER — HEPARIN SOD (PORK) LOCK FLUSH 100 UNIT/ML IV SOLN
500.0000 [IU] | Freq: Once | INTRAVENOUS | Status: AC | PRN
  Administered 2015-12-02: 500 [IU]
  Filled 2015-12-02: qty 5

## 2015-12-02 MED ORDER — SODIUM CHLORIDE 0.9 % IV SOLN
Freq: Once | INTRAVENOUS | Status: AC
  Administered 2015-12-02: 09:00:00 via INTRAVENOUS

## 2015-12-02 MED ORDER — SODIUM CHLORIDE 0.9% FLUSH
10.0000 mL | INTRAVENOUS | Status: DC | PRN
  Administered 2015-12-02: 10 mL
  Filled 2015-12-02: qty 10

## 2015-12-02 NOTE — Patient Instructions (Signed)
[facility] Discharge Instructions for Patients Receiving Chemotherapy  Today you received the following chemotherapy agents: Abraxane   To help prevent nausea and vomiting after your treatment, we encourage you to take your nausea medication as directed.    If you develop nausea and vomiting that is not controlled by your nausea medication, call the clinic.   BELOW ARE SYMPTOMS THAT SHOULD BE REPORTED IMMEDIATELY:  *FEVER GREATER THAN 100.5 F  *CHILLS WITH OR WITHOUT FEVER  NAUSEA AND VOMITING THAT IS NOT CONTROLLED WITH YOUR NAUSEA MEDICATION  *UNUSUAL SHORTNESS OF BREATH  *UNUSUAL BRUISING OR BLEEDING  TENDERNESS IN MOUTH AND THROAT WITH OR WITHOUT PRESENCE OF ULCERS  *URINARY PROBLEMS  *BOWEL PROBLEMS  UNUSUAL RASH Items with * indicate a potential emergency and should be followed up as soon as possible.  Feel free to call the clinic you have any questions or concerns. The clinic phone number is [phone].  Please show the CHEMO ALERT CARD at check-in to the Emergency Department and triage nurse.   

## 2015-12-02 NOTE — Progress Notes (Signed)
Patient Care Team: [name], MD as PCP - General (Family Medicine)  DIAGNOSIS:  Encounter Diagnosis  Name Primary?  . Malignant neoplasm of lower-inner quadrant of left breast in female, estrogen receptor positive ([place])     SUMMARY OF ONCOLOGIC HISTORY:   Breast cancer of lower-inner quadrant of left female breast ([place])   06/25/2015 Initial Diagnosis    Left breast biopsy 8:30 position: IDC grade 1, ER 90%, PR 0%, 50%, HER-2 Negative Ratio 1.21, Irregular Mass Left Breast 1 Cm from Nipple 2.5 x 2 x 2.5 Cm with Distortion, T2 N0 Stage II a Clinical Stage, Oncotype DX score 52, 34% ROR      07/30/2015 -  Neo-Adjuvant Chemotherapy    Neoadjuvant chemotherapy with dose dense Adriamycin and Cytoxan 4 followed by Abraxane weekly 12       CHIEF COMPLIANT: Cycle 10 Abraxane  INTERVAL HISTORY: Kim Ortega is a 60-year-old with above-mentioned history left breast cancer currently on neoadjuvant chemotherapy and today's cycle 10 of Abraxane. She is having neuropathy related to chemotherapy. It is mainly of the tips of the fingers and toes and reactive to reduce the dosage of her chemotherapy. Her neuropathy is stable and she appears to be doing quite well.  REVIEW OF SYSTEMS:   Constitutional: Denies fevers, chills or abnormal weight loss Eyes: Denies blurriness of vision Ears, nose, mouth, throat, and face: Denies mucositis or sore throat Respiratory: Denies cough, dyspnea or wheezes Cardiovascular: Denies palpitation, chest discomfort Gastrointestinal:  Denies nausea, heartburn or change in bowel habits Skin: Denies abnormal skin rashes Lymphatics: Denies new lymphadenopathy or easy bruising Neurological: Tingling and numbness of fingers and toes Behavioral/Psych: Mood is stable, no new changes  Extremities: No lower extremity edema  All other systems were reviewed with the patient and are negative.  I have reviewed the past medical history, past surgical history, social  history and family history with the patient and they are unchanged from previous note.  ALLERGIES:  has No Known Allergies.  MEDICATIONS:  Current Outpatient Prescriptions  Medication Sig Dispense Refill  . ALPRAZolam (XANAX) 0.5 MG tablet Take 0.5 mg by mouth 3 (three) times daily as needed for anxiety.    [name] anastrozole (ARIMIDEX) 1 MG tablet Take 1 tablet (1 mg total) by mouth daily. 30 tablet 0  . aspirin EC 81 MG tablet Take 81 mg by mouth daily.    . bisoprolol-hydrochlorothiazide (ZIAC) 10-6.25 MG tablet TAKE 1 TABLET BY MOUTH DAILY. 90 tablet 3  . Calcium Carbonate-Vitamin D (CALCIUM-D PO) Take 1 tablet by mouth 2 (two) times daily.     . cetirizine (ZYRTEC) 10 MG tablet Take 10 mg by mouth daily.    . clobetasol (OLUX) 0.05 % topical foam Apply 1 application topically daily as needed (psoriasis).   2  . desonide (DESOWEN) 0.05 % cream Apply 1 application topically daily. For psoriasis    . diclofenac sodium (VOLTAREN) 1 % GEL Apply 2 g topically 3 (three) times daily as needed (Pain).     . fluticasone (FLONASE) 50 MCG/ACT nasal spray INHALE 2 SPRAYS IN EACH NOSTRIL DAILY 16 g 11  . HYDROcodone-acetaminophen (NORCO) 10-325 MG tablet Take 1 tablet by mouth every 6 (six) hours as needed. 120 tablet 0  . ibuprofen (ADVIL,MOTRIN) 200 MG tablet Take 400 mg by mouth every 6 (six) hours as needed for headache (pain).    . Ketotifen Fumarate (ALLERGY EYE DROPS OP) Place 1 drop into both eyes 2 (two) times daily as needed (  allergies).     . losartan (COZAAR) 50 MG tablet TAKE 1 TABLET BY MOUTH ONCE DAILY. 90 tablet 3  . Multiple Vitamin (MULTIVITAMIN WITH MINERALS) TABS tablet Take 1 tablet by mouth daily.    . ondansetron (ZOFRAN) 8 MG tablet Take 1 tablet (8 mg) twice daily as needed for nausea/vomiting 60 tablet 2  . tiZANidine (ZANAFLEX) 4 MG tablet Take 4 mg by mouth at bedtime as needed (back pain).   2   No current facility-administered medications for this visit.     PHYSICAL  EXAMINATION: ECOG PERFORMANCE STATUS: 1 - Symptomatic but completely ambulatory  Vitals:   12/02/15 0833  BP: (!) 127/57  Pulse: 65  Resp: 18  Temp: 98 F (36.7 C)   Filed Weights   12/02/15 0833  Weight: 122 lb 11.2 oz (55.7 kg)    GENERAL:alert, no distress and comfortable SKIN: skin color, texture, turgor are normal, no rashes or significant lesions EYES: normal, Conjunctiva are pink and non-injected, sclera clear OROPHARYNX:no exudate, no erythema and lips, buccal mucosa, and tongue normal  NECK: supple, thyroid normal size, non-tender, without nodularity LYMPH:  no palpable lymphadenopathy in the cervical, axillary or inguinal LUNGS: clear to auscultation and percussion with normal breathing effort HEART: regular rate & rhythm and no murmurs and no lower extremity edema ABDOMEN:abdomen soft, non-tender and normal bowel sounds MUSCULOSKELETAL:no cyanosis of digits and no clubbing  NEURO: alert & oriented x 3 with fluent speech, distal peripheral sensory neuropathy EXTREMITIES: No lower extremity edema   LABORATORY DATA:  I have reviewed the data as listed   Chemistry      Component Value Date/Time   NA 135 (L) 12/02/2015 0807   K 4.2 12/02/2015 0807   CL 104 07/21/2015 1500   CO2 28 12/02/2015 0807   BUN 9.2 12/02/2015 0807   CREATININE 0.8 12/02/2015 0807      Component Value Date/Time   CALCIUM 9.8 12/02/2015 0807   ALKPHOS 53 12/02/2015 0807   AST 23 12/02/2015 0807   ALT 23 12/02/2015 0807   BILITOT 0.30 12/02/2015 0807       Lab Results  Component Value Date   WBC 4.8 12/02/2015   HGB 11.8 12/02/2015   HCT 33.6 (L) 12/02/2015   MCV 107.0 (H) 12/02/2015   PLT 225 12/02/2015   NEUTROABS 3.2 12/02/2015    ASSESSMENT & PLAN:  Breast cancer of lower-inner quadrant of left female breast (HCC) Left breast biopsy 8:30 position: IDC grade 1, ER 90%, PR 0%, 50%, HER-2 Negative Ratio 1.21, Irregular Mass Left Breast 1 Cm from Nipple 2.5 x 2 x 2.5 Cm with  Distortion,  T2 N0 Stage II a Clinical Stage Oncotype DX score 52, 34% risk of recurrence without chemotherapy  Treatment plan: 1. Neoadjuvant chemotherapy with dose dense Adriamycin and Cytoxan 4 followed by Abraxane weekly 12 2. Followed by surgery followed by 3. Adjuvant radiation therapy followed by 4. Adjuvant anastrozole 1 mg daily 5-10 years --------------------------------------------------------------------------------------------------------------------------------------- Current treatment: Cycle 4day 1dose dense Adriamycin and Cytoxan, Today is cycle10/12 Abraxane  Echocardiogram 07/16/2015 EF 55-60%  Chemotherapy toxicities: 1. Bone pain due to Neulasta: Improved with Claritin. 2.Mild nausea with abdominal cramps: Resolved with Compazine. 3. Palpitations: We discontinueddexamethasone orally. 4. Alopecia 5. Fatigue 6. Leukocytosis related to Neulasta 7. Chemotherapy-induced anemia: Will be monitored, hemoglobin 8.9 8. Leukopenia: Due to failure Neulasta with the fourth cycle of Adriamycin and Cytoxan. 9. Peripheral neuropathy grade 2: I will decrease the dosage of Abraxane to 60 mg/m. We will  have to see if she can complete her chemotherapy or we may have to stop treatment sooner for neuropathy.  Return to clinic in 2weeksfor cycle 12Abraxane. Breast MRI will be planned for 12/18/2015 Tumor board presentation on 12/24/2015 I informed Dr. [name] regarding the plan.   Orders Placed This Encounter  Procedures  . MR BREAST BILATERAL W WO CONTRAST    Standing Status:   Future    Standing Expiration Date:   01/31/2017    Order Specific Question:   If indicated for the ordered procedure, I authorize the administration of contrast media per Radiology protocol    Answer:   Yes    Order Specific Question:   Reason for Exam (SYMPTOM  OR DIAGNOSIS REQUIRED)    Answer:   Breast cancer post neoadj chemo    Order Specific Question:   Preferred imaging location?      Answer:   [facility] (table limit-550lbs)    Order Specific Question:   Does the patient have a pacemaker or implanted devices?    Answer:   No    Order Specific Question:   What is the patient's sedation requirement?    Answer:   No Sedation   The patient has a good understanding of the overall plan. she agrees with it. she will call with any problems that may develop before the next visit here.   Rulon Eisenmenger, MD 12/02/15

## 2015-12-05 ENCOUNTER — Encounter: Payer: Self-pay | Admitting: Family Medicine

## 2015-12-05 MED ORDER — HYDROCODONE-ACETAMINOPHEN 10-325 MG PO TABS: 1.0000 | ORAL_TABLET | Freq: Four times a day (QID) | ORAL | 0 refills | Status: DC | PRN

## 2015-12-05 NOTE — Telephone Encounter (Signed)
RX printed, left up front and patient aware to pick up via mychart 

## 2015-12-09 ENCOUNTER — Other Ambulatory Visit (HOSPITAL_BASED_OUTPATIENT_CLINIC_OR_DEPARTMENT_OTHER): Payer: BC Managed Care – PPO

## 2015-12-09 ENCOUNTER — Ambulatory Visit: Payer: BC Managed Care – PPO | Admitting: Hematology and Oncology

## 2015-12-09 ENCOUNTER — Ambulatory Visit (HOSPITAL_BASED_OUTPATIENT_CLINIC_OR_DEPARTMENT_OTHER): Payer: BC Managed Care – PPO

## 2015-12-09 ENCOUNTER — Encounter: Payer: Self-pay | Admitting: *Deleted

## 2015-12-09 VITALS — BP 116/74 | HR 65 | Temp 98.2°F | Resp 18

## 2015-12-09 DIAGNOSIS — Z5111 Encounter for antineoplastic chemotherapy: Secondary | ICD-10-CM

## 2015-12-09 DIAGNOSIS — C50312 Malignant neoplasm of lower-inner quadrant of left female breast: Secondary | ICD-10-CM

## 2015-12-09 DIAGNOSIS — Z170 Estrogen receptor positive status [ER+]: Principal | ICD-10-CM

## 2015-12-09 LAB — COMPREHENSIVE METABOLIC PANEL
ALBUMIN: 3.8 g/dL (ref 3.5–5.0)
ALK PHOS: 53 U/L (ref 40–150)
ALT: 19 U/L (ref 0–55)
AST: 21 U/L (ref 5–34)
Anion Gap: 10 mEq/L (ref 3–11)
BUN: 8.9 mg/dL (ref 7.0–26.0)
CALCIUM: 9.3 mg/dL (ref 8.4–10.4)
CHLORIDE: 98 meq/L (ref 98–109)
CO2: 24 mEq/L (ref 22–29)
Creatinine: 0.7 mg/dL (ref 0.6–1.1)
EGFR: 89 mL/min/{1.73_m2} — AB (ref >90)
Glucose: 102 mg/dl (ref 70–140)
POTASSIUM: 4.9 meq/L (ref 3.5–5.1)
Sodium: 132 mEq/L — ABNORMAL LOW (ref 136–145)
Total Bilirubin: 0.35 mg/dL (ref 0.20–1.20)
Total Protein: 6.2 g/dL — ABNORMAL LOW (ref 6.4–8.3)

## 2015-12-09 LAB — CBC WITH DIFFERENTIAL/PLATELET
BASO%: 0.9 % (ref 0.0–2.0)
BASOS ABS: 0.1 10*3/uL (ref 0.0–0.1)
EOS ABS: 0 10*3/uL (ref 0.0–0.5)
EOS%: 0.6 % (ref 0.0–7.0)
HEMATOCRIT: 32.9 % — AB (ref 34.8–46.6)
HEMOGLOBIN: 11.4 g/dL — AB (ref 11.6–15.9)
LYMPH%: 17.2 % (ref 14.0–49.7)
MCH: 38.3 pg — AB (ref 25.1–34.0)
MCHC: 34.7 g/dL (ref 31.5–36.0)
MCV: 110.4 fL — AB (ref 79.5–101.0)
MONO#: 0.6 10*3/uL (ref 0.1–0.9)
MONO%: 10.1 % (ref 0.0–14.0)
NEUT#: 3.9 10*3/uL (ref 1.5–6.5)
NEUT%: 71.2 % (ref 38.4–76.8)
Platelets: 246 10*3/uL (ref 145–400)
RBC: 2.98 10*6/uL — ABNORMAL LOW (ref 3.70–5.45)
RDW: 13.9 % (ref 11.2–14.5)
WBC: 5.5 10*3/uL (ref 3.9–10.3)
lymph#: 0.9 10*3/uL (ref 0.9–3.3)

## 2015-12-09 MED ORDER — PROCHLORPERAZINE MALEATE 10 MG PO TABS
ORAL_TABLET | ORAL | Status: AC
  Filled 2015-12-09: qty 1

## 2015-12-09 MED ORDER — SODIUM CHLORIDE 0.9 % IV SOLN
Freq: Once | INTRAVENOUS | Status: AC
  Administered 2015-12-09: 10:00:00 via INTRAVENOUS

## 2015-12-09 MED ORDER — HEPARIN SOD (PORK) LOCK FLUSH 100 UNIT/ML IV SOLN
500.0000 [IU] | Freq: Once | INTRAVENOUS | Status: AC | PRN
  Administered 2015-12-09: 500 [IU]
  Filled 2015-12-09: qty 5

## 2015-12-09 MED ORDER — PROCHLORPERAZINE MALEATE 10 MG PO TABS
10.0000 mg | ORAL_TABLET | Freq: Once | ORAL | Status: AC
  Administered 2015-12-09: 10 mg via ORAL

## 2015-12-09 MED ORDER — SODIUM CHLORIDE 0.9% FLUSH
10.0000 mL | INTRAVENOUS | Status: DC | PRN
  Administered 2015-12-09: 10 mL
  Filled 2015-12-09: qty 10

## 2015-12-09 MED ORDER — PACLITAXEL PROTEIN-BOUND CHEMO INJECTION 100 MG
60.0000 mg/m2 | Freq: Once | INTRAVENOUS | Status: AC
  Administered 2015-12-09: 100 mg via INTRAVENOUS
  Filled 2015-12-09: qty 20

## 2015-12-09 NOTE — Patient Instructions (Signed)
[facility] Discharge Instructions for Patients Receiving Chemotherapy  Today you received the following chemotherapy agents: Abraxane   To help prevent nausea and vomiting after your treatment, we encourage you to take your nausea medication as directed.    If you develop nausea and vomiting that is not controlled by your nausea medication, call the clinic.   BELOW ARE SYMPTOMS THAT SHOULD BE REPORTED IMMEDIATELY:  *FEVER GREATER THAN 100.5 F  *CHILLS WITH OR WITHOUT FEVER  NAUSEA AND VOMITING THAT IS NOT CONTROLLED WITH YOUR NAUSEA MEDICATION  *UNUSUAL SHORTNESS OF BREATH  *UNUSUAL BRUISING OR BLEEDING  TENDERNESS IN MOUTH AND THROAT WITH OR WITHOUT PRESENCE OF ULCERS  *URINARY PROBLEMS  *BOWEL PROBLEMS  UNUSUAL RASH Items with * indicate a potential emergency and should be followed up as soon as possible.  Feel free to call the clinic you have any questions or concerns. The clinic phone number is [phone].  Please show the CHEMO ALERT CARD at check-in to the Emergency Department and triage nurse.   

## 2015-12-16 ENCOUNTER — Ambulatory Visit: Payer: BC Managed Care – PPO | Admitting: Hematology and Oncology

## 2015-12-16 ENCOUNTER — Ambulatory Visit (HOSPITAL_BASED_OUTPATIENT_CLINIC_OR_DEPARTMENT_OTHER): Payer: BC Managed Care – PPO

## 2015-12-16 ENCOUNTER — Encounter: Payer: Self-pay | Admitting: *Deleted

## 2015-12-16 ENCOUNTER — Other Ambulatory Visit (HOSPITAL_BASED_OUTPATIENT_CLINIC_OR_DEPARTMENT_OTHER): Payer: BC Managed Care – PPO

## 2015-12-16 VITALS — BP 105/60 | HR 68 | Temp 98.2°F | Resp 17

## 2015-12-16 DIAGNOSIS — C50312 Malignant neoplasm of lower-inner quadrant of left female breast: Secondary | ICD-10-CM

## 2015-12-16 DIAGNOSIS — Z5111 Encounter for antineoplastic chemotherapy: Secondary | ICD-10-CM

## 2015-12-16 DIAGNOSIS — Z170 Estrogen receptor positive status [ER+]: Principal | ICD-10-CM

## 2015-12-16 LAB — CBC WITH DIFFERENTIAL/PLATELET
BASO%: 0.8 % (ref 0.0–2.0)
BASOS ABS: 0 10*3/uL (ref 0.0–0.1)
EOS%: 0.7 % (ref 0.0–7.0)
Eosinophils Absolute: 0 10*3/uL (ref 0.0–0.5)
HEMATOCRIT: 36.2 % (ref 34.8–46.6)
HGB: 12.2 g/dL (ref 11.6–15.9)
LYMPH#: 1 10*3/uL (ref 0.9–3.3)
LYMPH%: 19.7 % (ref 14.0–49.7)
MCH: 37.2 pg — ABNORMAL HIGH (ref 25.1–34.0)
MCHC: 33.6 g/dL (ref 31.5–36.0)
MCV: 110.6 fL — ABNORMAL HIGH (ref 79.5–101.0)
MONO#: 0.4 10*3/uL (ref 0.1–0.9)
MONO%: 7.3 % (ref 0.0–14.0)
NEUT#: 3.8 10*3/uL (ref 1.5–6.5)
NEUT%: 71.5 % (ref 38.4–76.8)
PLATELETS: 245 10*3/uL (ref 145–400)
RBC: 3.28 10*6/uL — ABNORMAL LOW (ref 3.70–5.45)
RDW: 14.3 % (ref 11.2–14.5)
WBC: 5.3 10*3/uL (ref 3.9–10.3)

## 2015-12-16 LAB — COMPREHENSIVE METABOLIC PANEL
ALBUMIN: 3.8 g/dL (ref 3.5–5.0)
ALK PHOS: 54 U/L (ref 40–150)
ALT: 20 U/L (ref 0–55)
ANION GAP: 9 meq/L (ref 3–11)
AST: 22 U/L (ref 5–34)
BILIRUBIN TOTAL: 0.4 mg/dL (ref 0.20–1.20)
BUN: 8.6 mg/dL (ref 7.0–26.0)
CALCIUM: 9.6 mg/dL (ref 8.4–10.4)
CHLORIDE: 104 meq/L (ref 98–109)
CO2: 26 mEq/L (ref 22–29)
CREATININE: 0.8 mg/dL (ref 0.6–1.1)
EGFR: 84 mL/min/{1.73_m2} — ABNORMAL LOW (ref >90)
Glucose: 149 mg/dl — ABNORMAL HIGH (ref 70–140)
Potassium: 4.2 mEq/L (ref 3.5–5.1)
Sodium: 139 mEq/L (ref 136–145)
TOTAL PROTEIN: 6.7 g/dL (ref 6.4–8.3)

## 2015-12-16 MED ORDER — HEPARIN SOD (PORK) LOCK FLUSH 100 UNIT/ML IV SOLN
500.0000 [IU] | Freq: Once | INTRAVENOUS | Status: AC | PRN
  Administered 2015-12-16: 500 [IU]
  Filled 2015-12-16: qty 5

## 2015-12-16 MED ORDER — SODIUM CHLORIDE 0.9% FLUSH
10.0000 mL | INTRAVENOUS | Status: DC | PRN
  Administered 2015-12-16: 10 mL
  Filled 2015-12-16: qty 10

## 2015-12-16 MED ORDER — PROCHLORPERAZINE MALEATE 10 MG PO TABS
10.0000 mg | ORAL_TABLET | Freq: Once | ORAL | Status: AC
  Administered 2015-12-16: 10 mg via ORAL

## 2015-12-16 MED ORDER — PACLITAXEL PROTEIN-BOUND CHEMO INJECTION 100 MG
60.0000 mg/m2 | Freq: Once | INTRAVENOUS | Status: AC
  Administered 2015-12-16: 100 mg via INTRAVENOUS
  Filled 2015-12-16: qty 20

## 2015-12-16 MED ORDER — SODIUM CHLORIDE 0.9 % IV SOLN
Freq: Once | INTRAVENOUS | Status: AC
  Administered 2015-12-16: 11:00:00 via INTRAVENOUS

## 2015-12-16 MED ORDER — PROCHLORPERAZINE MALEATE 10 MG PO TABS
ORAL_TABLET | ORAL | Status: AC
  Filled 2015-12-16: qty 1

## 2015-12-16 NOTE — Patient Instructions (Signed)
[facility] Discharge Instructions for Patients Receiving Chemotherapy  Today you received the following chemotherapy agents: Abraxane   To help prevent nausea and vomiting after your treatment, we encourage you to take your nausea medication as directed.    If you develop nausea and vomiting that is not controlled by your nausea medication, call the clinic.   BELOW ARE SYMPTOMS THAT SHOULD BE REPORTED IMMEDIATELY:  *FEVER GREATER THAN 100.5 F  *CHILLS WITH OR WITHOUT FEVER  NAUSEA AND VOMITING THAT IS NOT CONTROLLED WITH YOUR NAUSEA MEDICATION  *UNUSUAL SHORTNESS OF BREATH  *UNUSUAL BRUISING OR BLEEDING  TENDERNESS IN MOUTH AND THROAT WITH OR WITHOUT PRESENCE OF ULCERS  *URINARY PROBLEMS  *BOWEL PROBLEMS  UNUSUAL RASH Items with * indicate a potential emergency and should be followed up as soon as possible.  Feel free to call the clinic you have any questions or concerns. The clinic phone number is [phone].  Please show the CHEMO ALERT CARD at check-in to the Emergency Department and triage nurse.   

## 2015-12-18 ENCOUNTER — Ambulatory Visit
Admission: RE | Admit: 2015-12-18 | Discharge: 2015-12-18 | Disposition: A | Discharge status: Home / Self Care | Payer: BC Managed Care – PPO | Source: Ambulatory Visit | Attending: Hematology and Oncology | Admitting: Hematology and Oncology

## 2015-12-18 DIAGNOSIS — C50312 Malignant neoplasm of lower-inner quadrant of left female breast: Secondary | ICD-10-CM

## 2015-12-18 DIAGNOSIS — Z170 Estrogen receptor positive status [ER+]: Principal | ICD-10-CM

## 2015-12-18 MED ORDER — GADOBENATE DIMEGLUMINE 529 MG/ML IV SOLN
11.0000 mL | Freq: Once | INTRAVENOUS | Status: AC | PRN
  Administered 2015-12-18: 11 mL via INTRAVENOUS

## 2015-12-23 ENCOUNTER — Other Ambulatory Visit: Payer: Self-pay | Admitting: General Surgery

## 2015-12-23 DIAGNOSIS — C50312 Malignant neoplasm of lower-inner quadrant of left female breast: Secondary | ICD-10-CM

## 2015-12-23 DIAGNOSIS — C50212 Malignant neoplasm of upper-inner quadrant of left female breast: Secondary | ICD-10-CM

## 2015-12-23 NOTE — Assessment & Plan Note (Signed)
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 Neoadjuvant chemotherapy with dose dense Adriamycin and Cytoxan 4 followed by Abraxane weekly 12 2. Followed by surgery followed by 3. Adjuvant radiation therapy followed by 4. Adjuvant anastrozole 1 mg daily 5-10 years --------------------------------------------------------------------------------------------------------------------------------------- Current treatment: Cycle 4day 1dose dense Adriamycin and Cytoxan, Today is cycle10/12 Abraxane  Echocardiogram 07/16/2015 EF 55-60%  Chemotherapy toxicities: 1. Bone pain due to Neulasta: Improved with Claritin. 2.Mild nausea with abdominal cramps: Resolved with Compazine. 3. Palpitations: We discontinueddexamethasone orally. 4. Alopecia 5. Fatigue 6. Leukocytosis related to Neulasta 7. Chemotherapy-induced anemia: Will be monitored, hemoglobin 8.9 8. Leukopenia: Due to failure Neulasta with the fourth cycle of Adriamycin and Cytoxan. 9. Peripheral neuropathy grade 2: I will decrease the dosage of Abraxane to 60 mg/m. We will have to see if she can complete her chemotherapy or we may have to stop treatment sooner for neuropathy.  Breast MRI will be planned for 12/18/2015 Tumor board presentation on 12/24/2015

## 2015-12-24 ENCOUNTER — Ambulatory Visit (HOSPITAL_BASED_OUTPATIENT_CLINIC_OR_DEPARTMENT_OTHER): Payer: BC Managed Care – PPO | Admitting: Hematology and Oncology

## 2015-12-24 ENCOUNTER — Encounter: Payer: Self-pay | Admitting: Hematology and Oncology

## 2015-12-24 DIAGNOSIS — Z170 Estrogen receptor positive status [ER+]: Secondary | ICD-10-CM

## 2015-12-24 DIAGNOSIS — R5383 Other fatigue: Secondary | ICD-10-CM | POA: Diagnosis not present

## 2015-12-24 DIAGNOSIS — C50312 Malignant neoplasm of lower-inner quadrant of left female breast: Secondary | ICD-10-CM

## 2015-12-24 DIAGNOSIS — G629 Polyneuropathy, unspecified: Secondary | ICD-10-CM | POA: Diagnosis not present

## 2015-12-24 NOTE — Progress Notes (Signed)
Patient Care Team: [name], MD as PCP - General (Family Medicine)  DIAGNOSIS:  Encounter Diagnosis  Name Primary?  . Malignant neoplasm of lower-inner quadrant of left breast in female, estrogen receptor positive ([place])     SUMMARY OF ONCOLOGIC HISTORY:   Breast cancer of lower-inner quadrant of left female breast ([place])   06/25/2015 Initial Diagnosis    Left breast biopsy 8:30 position: IDC grade 1, ER 90%, PR 0%, 50%, HER-2 Negative Ratio 1.21, Irregular Mass Left Breast 1 Cm from Nipple 2.5 x 2 x 2.5 Cm with Distortion, T2 N0 Stage II a Clinical Stage, Oncotype DX score 52, 34% ROR      07/30/2015 - 12/16/2015 Neo-Adjuvant Chemotherapy    Neoadjuvant chemotherapy with dose dense Adriamycin and Cytoxan 4 followed by Abraxane weekly 12      12/18/2015 Breast MRI    Excellent interval response to chemotherapy with only minimal residual enhancement, 2 tiny rounded regions along superior margin, unchanged, no abnormal lymph nodes        CHIEF COMPLIANT: Follow-up to discuss breast MRI result  INTERVAL HISTORY: CARESSA Ortega is a 60 year old with left breast cancer completed neoadjuvant chemotherapy and is here today to discuss the MRI report. She was presented this morning of the multidisciplinary tumor board. She continues to have mild to moderate fatigue. Mild neuropathy  REVIEW OF SYSTEMS:   Constitutional: Denies fevers, chills or abnormal weight loss Eyes: Denies blurriness of vision Ears, nose, mouth, throat, and face: Denies mucositis or sore throat Respiratory: Denies cough, dyspnea or wheezes Cardiovascular: Denies palpitation, chest discomfort Gastrointestinal:  Denies nausea, heartburn or change in bowel habits Skin: Denies abnormal skin rashes Lymphatics: Denies new lymphadenopathy or easy bruising Neurological: Mild neuropathy in the feet Behavioral/Psych: Mood is stable, no new changes  Extremities: No lower extremity edema  All other systems were  reviewed with the patient and are negative.  I have reviewed the past medical history, past surgical history, social history and family history with the patient and they are unchanged from previous note.  ALLERGIES:  has No Known Allergies.  MEDICATIONS:  Current Outpatient Prescriptions  Medication Sig Dispense Refill  . ALPRAZolam (XANAX) 0.5 MG tablet Take 0.5 mg by mouth 3 (three) times daily as needed for anxiety.    [name] anastrozole (ARIMIDEX) 1 MG tablet Take 1 tablet (1 mg total) by mouth daily. 30 tablet 0  . aspirin EC 81 MG tablet Take 81 mg by mouth daily.    . bisoprolol-hydrochlorothiazide (ZIAC) 10-6.25 MG tablet TAKE 1 TABLET BY MOUTH DAILY. 90 tablet 3  . Calcium Carbonate-Vitamin D (CALCIUM-D PO) Take 1 tablet by mouth 2 (two) times daily.     . cetirizine (ZYRTEC) 10 MG tablet Take 10 mg by mouth daily.    . clobetasol (OLUX) 0.05 % topical foam Apply 1 application topically daily as needed (psoriasis).   2  . desonide (DESOWEN) 0.05 % cream Apply 1 application topically daily. For psoriasis    . diclofenac sodium (VOLTAREN) 1 % GEL Apply 2 g topically 3 (three) times daily as needed (Pain).     . fluticasone (FLONASE) 50 MCG/ACT nasal spray INHALE 2 SPRAYS IN EACH NOSTRIL DAILY 16 g 11  . HYDROcodone-acetaminophen (NORCO) 10-325 MG tablet Take 1 tablet by mouth every 6 (six) hours as needed. 120 tablet 0  . ibuprofen (ADVIL,MOTRIN) 200 MG tablet Take 400 mg by mouth every 6 (six) hours as needed for headache (pain).    . Ketotifen Fumarate (  ALLERGY EYE DROPS OP) Place 1 drop into both eyes 2 (two) times daily as needed (allergies).     . losartan (COZAAR) 50 MG tablet TAKE 1 TABLET BY MOUTH ONCE DAILY. 90 tablet 3  . Multiple Vitamin (MULTIVITAMIN WITH MINERALS) TABS tablet Take 1 tablet by mouth daily.    . ondansetron (ZOFRAN) 8 MG tablet Take 1 tablet (8 mg) twice daily as needed for nausea/vomiting 60 tablet 2  . tiZANidine (ZANAFLEX) 4 MG tablet Take 4 mg by mouth at  bedtime as needed (back pain).   2   No current facility-administered medications for this visit.     PHYSICAL EXAMINATION: ECOG PERFORMANCE STATUS: 1 - Symptomatic but completely ambulatory  Vitals:   12/24/15 0915  BP: (!) 119/53  Pulse: 70  Resp: 18  Temp: 98 F (36.7 C)   Filed Weights   12/24/15 0915  Weight: 123 lb (55.8 kg)    GENERAL:alert, no distress and comfortable SKIN: skin color, texture, turgor are normal, no rashes or significant lesions EYES: normal, Conjunctiva are pink and non-injected, sclera clear OROPHARYNX:no exudate, no erythema and lips, buccal mucosa, and tongue normal  NECK: supple, thyroid normal size, non-tender, without nodularity LYMPH:  no palpable lymphadenopathy in the cervical, axillary or inguinal LUNGS: clear to auscultation and percussion with normal breathing effort HEART: regular rate & rhythm and no murmurs and no lower extremity edema ABDOMEN:abdomen soft, non-tender and normal bowel sounds MUSCULOSKELETAL:no cyanosis of digits and no clubbing  NEURO: alert & oriented x 3 with fluent speech, no focal motor/sensory deficits EXTREMITIES: No lower extremity edema  LABORATORY DATA:  I have reviewed the data as listed   Chemistry      Component Value Date/Time   NA 139 12/16/2015 0947   K 4.2 12/16/2015 0947   CL 104 07/21/2015 1500   CO2 26 12/16/2015 0947   BUN 8.6 12/16/2015 0947   CREATININE 0.8 12/16/2015 0947      Component Value Date/Time   CALCIUM 9.6 12/16/2015 0947   ALKPHOS 54 12/16/2015 0947   AST 22 12/16/2015 0947   ALT 20 12/16/2015 0947   BILITOT 0.40 12/16/2015 0947       Lab Results  Component Value Date   WBC 5.3 12/16/2015   HGB 12.2 12/16/2015   HCT 36.2 12/16/2015   MCV 110.6 (H) 12/16/2015   PLT 245 12/16/2015   NEUTROABS 3.8 12/16/2015    ASSESSMENT & PLAN:  Breast cancer of lower-inner quadrant of left female breast ([facility]) Left breast biopsy 8:30 position: IDC grade 1, ER 90%, PR 0%, 50%,  HER-2 Negative Ratio 1.21, Irregular Mass Left Breast 1 Cm from Nipple 2.5 x 2 x 2.5 Cm with Distortion,  T2 N0 Stage II a Clinical Stage Oncotype DX score 52, 34% risk of recurrence without chemotherapy  Treatment plan: 1. Neoadjuvant chemotherapy with dose dense Adriamycin and Cytoxan 4 followed by Abraxane weekly 12; started 07/30/2015 completed 12/16/2015  2. Followed by surgery followed by 3. Adjuvant radiation therapy followed by 4. Adjuvant anastrozole 1 mg daily 5-10 years --------------------------------------------------------------------------------------------------------------------------------------- Echocardiogram 07/16/2015 EF 55-60%  Breast MRI 12/18/2015: Excellent interval response to chemotherapy with only minimal residual enhancement, 2 tiny rounded regions along superior margin, unchanged, no abnormal lymph nodes  Patient has appointments for surgery. I will see the patient back after surgery to discuss the final pathology report.  No orders of the defined types were placed in this encounter.  The patient has a good understanding of the overall plan. she agrees  with it. she will call with any problems that may develop before the next visit here.   Rulon Eisenmenger, MD 12/24/15

## 2016-01-01 ENCOUNTER — Encounter (HOSPITAL_BASED_OUTPATIENT_CLINIC_OR_DEPARTMENT_OTHER): Payer: Self-pay | Admitting: *Deleted

## 2016-01-02 ENCOUNTER — Encounter: Payer: Self-pay | Admitting: Radiation Oncology

## 2016-01-02 ENCOUNTER — Telehealth: Payer: Self-pay | Admitting: Hematology and Oncology

## 2016-01-02 NOTE — Telephone Encounter (Signed)
sw pt to confirm 1/11 appt date/time per LOS

## 2016-01-05 DIAGNOSIS — Z923 Personal history of irradiation: Secondary | ICD-10-CM

## 2016-01-05 HISTORY — DX: Personal history of irradiation: Z92.3

## 2016-01-05 HISTORY — PX: BREAST LUMPECTOMY: SHX2

## 2016-01-07 ENCOUNTER — Ambulatory Visit
Admission: RE | Admit: 2016-01-07 | Discharge: 2016-01-07 | Disposition: A | Discharge status: Home / Self Care | Payer: BC Managed Care – PPO | Source: Ambulatory Visit | Attending: General Surgery | Admitting: General Surgery

## 2016-01-07 DIAGNOSIS — C50212 Malignant neoplasm of upper-inner quadrant of left female breast: Secondary | ICD-10-CM

## 2016-01-07 NOTE — Progress Notes (Signed)
Pt instructed to drink Boost before 0430 day of surgery with teach back method.

## 2016-01-08 ENCOUNTER — Ambulatory Visit (HOSPITAL_BASED_OUTPATIENT_CLINIC_OR_DEPARTMENT_OTHER): Payer: BC Managed Care – PPO | Admitting: Anesthesiology

## 2016-01-08 ENCOUNTER — Ambulatory Visit (HOSPITAL_BASED_OUTPATIENT_CLINIC_OR_DEPARTMENT_OTHER)
Admission: RE | Admit: 2016-01-08 | Discharge: 2016-01-08 | Disposition: A | Discharge status: Home / Self Care | Payer: BC Managed Care – PPO | Source: Ambulatory Visit | Attending: General Surgery | Admitting: General Surgery

## 2016-01-08 ENCOUNTER — Ambulatory Visit
Admission: RE | Admit: 2016-01-08 | Discharge: 2016-01-08 | Disposition: A | Discharge status: Home / Self Care | Payer: BC Managed Care – PPO | Source: Ambulatory Visit | Attending: General Surgery | Admitting: General Surgery

## 2016-01-08 ENCOUNTER — Encounter (HOSPITAL_BASED_OUTPATIENT_CLINIC_OR_DEPARTMENT_OTHER)
Admission: RE | Disposition: A | Discharge status: Home / Self Care | Payer: Self-pay | Source: Ambulatory Visit | Attending: General Surgery

## 2016-01-08 ENCOUNTER — Encounter (HOSPITAL_COMMUNITY)
Admission: RE | Admit: 2016-01-08 | Discharge: 2016-01-08 | Disposition: A | Discharge status: Home / Self Care | Payer: BC Managed Care – PPO | Source: Ambulatory Visit | Attending: General Surgery | Admitting: General Surgery

## 2016-01-08 ENCOUNTER — Encounter (HOSPITAL_BASED_OUTPATIENT_CLINIC_OR_DEPARTMENT_OTHER): Payer: Self-pay | Admitting: *Deleted

## 2016-01-08 DIAGNOSIS — C50312 Malignant neoplasm of lower-inner quadrant of left female breast: Secondary | ICD-10-CM | POA: Diagnosis not present

## 2016-01-08 DIAGNOSIS — Z452 Encounter for adjustment and management of vascular access device: Secondary | ICD-10-CM | POA: Insufficient documentation

## 2016-01-08 DIAGNOSIS — F17210 Nicotine dependence, cigarettes, uncomplicated: Secondary | ICD-10-CM | POA: Insufficient documentation

## 2016-01-08 DIAGNOSIS — Z79899 Other long term (current) drug therapy: Secondary | ICD-10-CM | POA: Diagnosis not present

## 2016-01-08 DIAGNOSIS — C50911 Malignant neoplasm of unspecified site of right female breast: Secondary | ICD-10-CM | POA: Diagnosis present

## 2016-01-08 DIAGNOSIS — Z90710 Acquired absence of both cervix and uterus: Secondary | ICD-10-CM | POA: Insufficient documentation

## 2016-01-08 DIAGNOSIS — I739 Peripheral vascular disease, unspecified: Secondary | ICD-10-CM | POA: Insufficient documentation

## 2016-01-08 DIAGNOSIS — I10 Essential (primary) hypertension: Secondary | ICD-10-CM | POA: Insufficient documentation

## 2016-01-08 DIAGNOSIS — C50212 Malignant neoplasm of upper-inner quadrant of left female breast: Secondary | ICD-10-CM

## 2016-01-08 HISTORY — PX: PORT-A-CATH REMOVAL: SHX5289

## 2016-01-08 HISTORY — PX: BREAST LUMPECTOMY WITH RADIOACTIVE SEED AND SENTINEL LYMPH NODE BIOPSY: SHX6550

## 2016-01-08 SURGERY — BREAST LUMPECTOMY WITH RADIOACTIVE SEED AND SENTINEL LYMPH NODE BIOPSY
Anesthesia: General | Site: Chest | Laterality: Right

## 2016-01-08 MED ORDER — DEXAMETHASONE SODIUM PHOSPHATE 4 MG/ML IJ SOLN
INTRAMUSCULAR | Status: DC | PRN
Start: 2016-01-08 — End: 2016-01-08
  Administered 2016-01-08: 10 mg via INTRAVENOUS

## 2016-01-08 MED ORDER — EPHEDRINE SULFATE 50 MG/ML IJ SOLN
INTRAMUSCULAR | Status: DC | PRN
  Administered 2016-01-08 (×2): 10 mg via INTRAVENOUS

## 2016-01-08 MED ORDER — CEFAZOLIN SODIUM-DEXTROSE 2-4 GM/100ML-% IV SOLN
INTRAVENOUS | Status: AC
  Filled 2016-01-08: qty 100

## 2016-01-08 MED ORDER — PROPOFOL 10 MG/ML IV BOLUS
INTRAVENOUS | Status: DC | PRN
  Administered 2016-01-08: 150 mg via INTRAVENOUS

## 2016-01-08 MED ORDER — CEFAZOLIN SODIUM-DEXTROSE 2-4 GM/100ML-% IV SOLN
2.0000 g | INTRAVENOUS | Status: AC
  Administered 2016-01-08: 2 g via INTRAVENOUS

## 2016-01-08 MED ORDER — TECHNETIUM TC 99M SULFUR COLLOID FILTERED
1.0000 | Freq: Once | INTRAVENOUS | Status: AC | PRN
  Administered 2016-01-08: 1 via INTRADERMAL

## 2016-01-08 MED ORDER — DEXAMETHASONE SODIUM PHOSPHATE 10 MG/ML IJ SOLN
INTRAMUSCULAR | Status: AC
  Filled 2016-01-08: qty 1

## 2016-01-08 MED ORDER — MIDAZOLAM HCL 2 MG/2ML IJ SOLN
1.0000 mg | INTRAMUSCULAR | Status: DC | PRN
  Administered 2016-01-08: 2 mg via INTRAVENOUS

## 2016-01-08 MED ORDER — SODIUM CHLORIDE 0.9 % IJ SOLN
INTRAVENOUS | Status: DC | PRN
  Administered 2016-01-08: 4 mL

## 2016-01-08 MED ORDER — MIDAZOLAM HCL 2 MG/2ML IJ SOLN
INTRAMUSCULAR | Status: AC
  Filled 2016-01-08: qty 2

## 2016-01-08 MED ORDER — HYDROCODONE-ACETAMINOPHEN 10-325 MG PO TABS: 1.0000 | ORAL_TABLET | Freq: Four times a day (QID) | ORAL | 0 refills | Status: DC | PRN

## 2016-01-08 MED ORDER — BUPIVACAINE HCL (PF) 0.25 % IJ SOLN
INTRAMUSCULAR | Status: DC | PRN
  Administered 2016-01-08: 7 mL

## 2016-01-08 MED ORDER — FENTANYL CITRATE (PF) 100 MCG/2ML IJ SOLN
INTRAMUSCULAR | Status: AC
  Filled 2016-01-08: qty 2

## 2016-01-08 MED ORDER — HYDROMORPHONE HCL 1 MG/ML IJ SOLN
0.2500 mg | INTRAMUSCULAR | Status: DC | PRN
  Administered 2016-01-08: 0.5 mg via INTRAVENOUS
  Administered 2016-01-08: 0.25 mg via INTRAVENOUS
  Administered 2016-01-08: 0.5 mg via INTRAVENOUS

## 2016-01-08 MED ORDER — LIDOCAINE HCL (CARDIAC) 20 MG/ML IV SOLN
INTRAVENOUS | Status: DC | PRN
  Administered 2016-01-08: 60 mg via INTRAVENOUS

## 2016-01-08 MED ORDER — ONDANSETRON HCL 4 MG/2ML IJ SOLN
INTRAMUSCULAR | Status: AC
  Filled 2016-01-08: qty 2

## 2016-01-08 MED ORDER — HYDROMORPHONE HCL 1 MG/ML IJ SOLN
INTRAMUSCULAR | Status: AC
  Filled 2016-01-08: qty 1

## 2016-01-08 MED ORDER — ACETAMINOPHEN 500 MG PO TABS
1000.0000 mg | ORAL_TABLET | ORAL | Status: AC
  Administered 2016-01-08: 1000 mg via ORAL

## 2016-01-08 MED ORDER — ONDANSETRON HCL 4 MG/2ML IJ SOLN
INTRAMUSCULAR | Status: DC | PRN
  Administered 2016-01-08: 4 mg via INTRAVENOUS

## 2016-01-08 MED ORDER — FENTANYL CITRATE (PF) 100 MCG/2ML IJ SOLN
50.0000 ug | INTRAMUSCULAR | Status: AC | PRN
  Administered 2016-01-08: 50 ug via INTRAVENOUS
  Administered 2016-01-08: 100 ug via INTRAVENOUS
  Administered 2016-01-08: 50 ug via INTRAVENOUS

## 2016-01-08 MED ORDER — PROMETHAZINE HCL 25 MG/ML IJ SOLN: 6.2500 mg | INTRAMUSCULAR | Status: DC | PRN

## 2016-01-08 MED ORDER — GABAPENTIN 300 MG PO CAPS
ORAL_CAPSULE | ORAL | Status: AC
  Filled 2016-01-08: qty 1

## 2016-01-08 MED ORDER — ACETAMINOPHEN 500 MG PO TABS
ORAL_TABLET | ORAL | Status: AC
  Filled 2016-01-08: qty 2

## 2016-01-08 MED ORDER — LACTATED RINGERS IV SOLN
INTRAVENOUS | Status: DC
  Administered 2016-01-08: 10:00:00 via INTRAVENOUS
  Administered 2016-01-08: 10 mL/h via INTRAVENOUS

## 2016-01-08 MED ORDER — GABAPENTIN 300 MG PO CAPS
300.0000 mg | ORAL_CAPSULE | ORAL | Status: AC
  Administered 2016-01-08: 300 mg via ORAL

## 2016-01-08 MED ORDER — SCOPOLAMINE 1 MG/3DAYS TD PT72: 1.0000 | MEDICATED_PATCH | Freq: Once | TRANSDERMAL | Status: DC | PRN

## 2016-01-08 SURGICAL SUPPLY — 62 items
BINDER BREAST LRG (GAUZE/BANDAGES/DRESSINGS) IMPLANT
BINDER BREAST MEDIUM (GAUZE/BANDAGES/DRESSINGS) ×3 IMPLANT
BINDER BREAST XLRG (GAUZE/BANDAGES/DRESSINGS) IMPLANT
BINDER BREAST XXLRG (GAUZE/BANDAGES/DRESSINGS) IMPLANT
BLADE SURG 15 STRL LF DISP TIS (BLADE) ×4 IMPLANT
BLADE SURG 15 STRL SS (BLADE) ×2
CANISTER SUC SOCK COL 7IN (MISCELLANEOUS) IMPLANT
CANISTER SUCT 1200ML W/VALVE (MISCELLANEOUS) IMPLANT
CHLORAPREP W/TINT 26ML (MISCELLANEOUS) ×3 IMPLANT
CLIP TI WIDE RED SMALL 6 (CLIP) ×3 IMPLANT
COVER BACK TABLE 60X90IN (DRAPES) ×3 IMPLANT
COVER MAYO STAND STRL (DRAPES) ×3 IMPLANT
COVER PROBE W GEL 5X96 (DRAPES) ×3 IMPLANT
DECANTER SPIKE VIAL GLASS SM (MISCELLANEOUS) IMPLANT
DERMABOND ADVANCED (GAUZE/BANDAGES/DRESSINGS) ×1
DERMABOND ADVANCED .7 DNX12 (GAUZE/BANDAGES/DRESSINGS) ×2 IMPLANT
DEVICE DUBIN W/COMP PLATE 8390 (MISCELLANEOUS) ×3 IMPLANT
DRAPE LAPAROSCOPIC ABDOMINAL (DRAPES) ×3 IMPLANT
DRAPE LAPAROTOMY 100X72 PEDS (DRAPES) IMPLANT
DRAPE UTILITY XL STRL (DRAPES) ×3 IMPLANT
ELECT COATED BLADE 2.86 ST (ELECTRODE) ×3 IMPLANT
ELECT REM PT RETURN 9FT ADLT (ELECTROSURGICAL) ×3
ELECTRODE REM PT RTRN 9FT ADLT (ELECTROSURGICAL) ×2 IMPLANT
GLOVE BIO SURGEON STRL SZ 6.5 (GLOVE) ×3 IMPLANT
GLOVE BIO SURGEON STRL SZ7 (GLOVE) ×6 IMPLANT
GLOVE BIOGEL PI IND STRL 6.5 (GLOVE) ×2 IMPLANT
GLOVE BIOGEL PI IND STRL 7.0 (GLOVE) ×2 IMPLANT
GLOVE BIOGEL PI IND STRL 7.5 (GLOVE) ×2 IMPLANT
GLOVE BIOGEL PI INDICATOR 6.5 (GLOVE) ×1
GLOVE BIOGEL PI INDICATOR 7.0 (GLOVE) ×1
GLOVE BIOGEL PI INDICATOR 7.5 (GLOVE) ×1
GLOVE ECLIPSE 6.5 STRL STRAW (GLOVE) ×3 IMPLANT
GOWN STRL REUS W/ TWL LRG LVL3 (GOWN DISPOSABLE) ×6 IMPLANT
GOWN STRL REUS W/TWL LRG LVL3 (GOWN DISPOSABLE) ×3
HEMOSTAT ARISTA ABSORB 3G PWDR (MISCELLANEOUS) ×3 IMPLANT
ILLUMINATOR WAVEGUIDE N/F (MISCELLANEOUS) ×3 IMPLANT
KIT MARKER MARGIN INK (KITS) ×3 IMPLANT
LIGHT WAVEGUIDE WIDE FLAT (MISCELLANEOUS) IMPLANT
NDL SAFETY ECLIPSE 18X1.5 (NEEDLE) IMPLANT
NEEDLE HYPO 18GX1.5 SHARP (NEEDLE)
NEEDLE HYPO 25X1 1.5 SAFETY (NEEDLE) ×6 IMPLANT
NS IRRIG 1000ML POUR BTL (IV SOLUTION) ×3 IMPLANT
PACK BASIN DAY SURGERY FS (CUSTOM PROCEDURE TRAY) ×3 IMPLANT
PENCIL BUTTON HOLSTER BLD 10FT (ELECTRODE) ×3 IMPLANT
SLEEVE SCD COMPRESS KNEE MED (MISCELLANEOUS) ×3 IMPLANT
SPONGE LAP 4X18 X RAY DECT (DISPOSABLE) ×3 IMPLANT
STRIP CLOSURE SKIN 1/2X4 (GAUZE/BANDAGES/DRESSINGS) ×3 IMPLANT
SUT ETHILON 2 0 FS 18 (SUTURE) IMPLANT
SUT MNCRL AB 4-0 PS2 18 (SUTURE) ×6 IMPLANT
SUT MON AB 4-0 PC3 18 (SUTURE) IMPLANT
SUT MON AB 5-0 PS2 18 (SUTURE) IMPLANT
SUT SILK 2 0 SH (SUTURE) IMPLANT
SUT VIC AB 2-0 SH 27 (SUTURE) ×2
SUT VIC AB 2-0 SH 27XBRD (SUTURE) ×4 IMPLANT
SUT VIC AB 3-0 SH 27 (SUTURE) ×1
SUT VIC AB 3-0 SH 27X BRD (SUTURE) ×2 IMPLANT
SUT VIC AB 5-0 PS2 18 (SUTURE) IMPLANT
SYR CONTROL 10ML LL (SYRINGE) ×6 IMPLANT
TOWEL OR 17X24 6PK STRL BLUE (TOWEL DISPOSABLE) ×3 IMPLANT
TOWEL OR NON WOVEN STRL DISP B (DISPOSABLE) IMPLANT
TUBE CONNECTING 20X1/4 (TUBING) IMPLANT
YANKAUER SUCT BULB TIP NO VENT (SUCTIONS) IMPLANT

## 2016-01-08 NOTE — Anesthesia Preprocedure Evaluation (Signed)
Anesthesia Evaluation  Patient identified by MRN, date of birth, ID band Patient awake    Reviewed: Allergy & Precautions, NPO status , Patient's Chart, lab work & pertinent test results  Airway Mallampati: II  TM Distance: >3 FB     Dental   Pulmonary Current Smoker,    breath sounds clear to auscultation       Cardiovascular hypertension, + Peripheral Vascular Disease   Rhythm:Regular Rate:Normal     Neuro/Psych    GI/Hepatic negative GI ROS, Neg liver ROS,   Endo/Other  negative endocrine ROS  Renal/GU negative Renal ROS     Musculoskeletal   Abdominal   Peds  Hematology  (+) anemia ,   Anesthesia Other Findings   Reproductive/Obstetrics                             Anesthesia Physical Anesthesia Plan  ASA: III  Anesthesia Plan: General   Post-op Pain Management:    Induction: Intravenous  Airway Management Planned: Oral ETT  Additional Equipment:   Intra-op Plan:   Post-operative Plan: Extubation in OR  Informed Consent: I have reviewed the patients History and Physical, chart, labs and discussed the procedure including the risks, benefits and alternatives for the proposed anesthesia with the patient or authorized representative who has indicated his/her understanding and acceptance.   Dental advisory given  Plan Discussed with: CRNA and Anesthesiologist  Anesthesia Plan Comments:         Anesthesia Quick Evaluation

## 2016-01-08 NOTE — Anesthesia Procedure Notes (Signed)
Anesthesia Regional Block:  Pectoralis block  Pre-Anesthetic Checklist: ,, timeout performed, Correct Patient, Correct Site, Correct Laterality, Correct Procedure, Correct Position, site marked, Risks and benefits discussed,  Surgical consent,  Pre-op evaluation,  At surgeon's request and post-op pain management  Laterality: Left  Prep: chloraprep       Needles:   Needle Type: Stimulator Needle - 40          Additional Needles:  Procedures: Doppler guided and ultrasound guided (picture in chart) Pectoralis block Narrative:  Start time: 01/08/2016 7:45 AM End time: 01/08/2016 8:00 AM Injection made incrementally with aspirations every 5 mL.  Performed by: Personally  Anesthesiologist: Belinda Block

## 2016-01-08 NOTE — Progress Notes (Signed)
Assisted Dr. Oletta Lamas with left, ultrasound guided, pectoralis block. Side rails up, monitors on throughout procedure. See vital signs in flow sheet. Tolerated Procedure well.

## 2016-01-08 NOTE — Discharge Instructions (Signed)
[facility] °Office Phone Number [phone] ° °POST OP INSTRUCTIONS ° °Always review your discharge instruction sheet given to you by the facility where your surgery was performed. ° °IF YOU HAVE DISABILITY OR FAMILY LEAVE FORMS, YOU MUST BRING THEM TO THE OFFICE FOR PROCESSING.  DO NOT GIVE THEM TO YOUR DOCTOR. ° °1. A prescription for pain medication may be given to you upon discharge.  Take your pain medication as prescribed, if needed.  If narcotic pain medicine is not needed, then you may take acetaminophen (Tylenol), naprosyn (Alleve) or ibuprofen (Advil) as needed. °2. Take your usually prescribed medications unless otherwise directed °3. If you need a refill on your pain medication, please contact your pharmacy.  They will contact our office to request authorization.  Prescriptions will not be filled after 5pm or on week-ends. °4. You should eat very light the first 24 hours after surgery, such as soup, crackers, pudding, etc.  Resume your normal diet the day after surgery. °5. Most patients will experience some swelling and bruising in the breast.  Ice packs and a good support bra will help.  Wear the breast binder provided or a sports bra for 72 hours day and night.  After that wear a sports bra during the day until you return to the office. Swelling and bruising can take several days to resolve.  °6. It is common to experience some constipation if taking pain medication after surgery.  Increasing fluid intake and taking a stool softener will usually help or prevent this problem from occurring.  A mild laxative (Milk of Magnesia or Miralax) should be taken according to package directions if there are no bowel movements after 48 hours. °7. Unless discharge instructions indicate otherwise, you may remove your bandages 48 hours after surgery and you may shower at that time.  You may have steri-strips (small skin tapes) in place directly over the incision.  These strips should be left on the  skin for 7-10 days and will come off on their own.  If your surgeon used skin glue on the incision, you may shower in 24 hours.  The glue will flake off over the next 2-3 weeks.  Any sutures or staples will be removed at the office during your follow-up visit. °8. ACTIVITIES:  You may resume regular daily activities (gradually increasing) beginning the next day.  Wearing a good support bra or sports bra minimizes pain and swelling.  You may have sexual intercourse when it is comfortable. °a. You may drive when you no longer are taking prescription pain medication, you can comfortably wear a seatbelt, and you can safely maneuver your car and apply brakes. °b. RETURN TO WORK:  ______________________________________________________________________________________ °9. You should see your doctor in the office for a follow-up appointment approximately two weeks after your surgery.  Your doctor’s nurse will typically make your follow-up appointment when she calls you with your pathology report.  Expect your pathology report 3-4 business days after your surgery.  You may call to check if you do not hear from us after three days. °10. OTHER INSTRUCTIONS: _______________________________________________________________________________________________ _____________________________________________________________________________________________________________________________________ °_____________________________________________________________________________________________________________________________________ °_____________________________________________________________________________________________________________________________________ ° °WHEN TO CALL DR WAKEFIELD: °1. Fever over 101.0 °2. Nausea and/or vomiting. °3. Extreme swelling or bruising. °4. Continued bleeding from incision. °5. Increased pain, redness, or drainage from the incision. ° °The clinic staff is available to answer your questions during regular  business hours.  Please don’t hesitate to call and ask to speak to one of the nurses for clinical concerns.  If   you have a medical emergency, go to the nearest emergency room or call 911.  A surgeon from [facility] is always on call at the hospital. ° °For further questions, please visit [email or web address] ° ° ° °Post Anesthesia Home Care Instructions ° °Activity: °Get plenty of rest for the remainder of the day. A responsible adult should stay with you for 24 hours following the procedure.  °For the next 24 hours, DO NOT: °-Drive a car °-Operate machinery °-Drink alcoholic beverages °-Take any medication unless instructed by your physician °-Make any legal decisions or sign important papers. ° °Meals: °Start with liquid foods such as gelatin or soup. Progress to regular foods as tolerated. Avoid greasy, spicy, heavy foods. If nausea and/or vomiting occur, drink only clear liquids until the nausea and/or vomiting subsides. Call your physician if vomiting continues. ° °Special Instructions/Symptoms: °Your throat may feel dry or sore from the anesthesia or the breathing tube placed in your throat during surgery. If this causes discomfort, gargle with warm salt water. The discomfort should disappear within 24 hours. ° °If you had a scopolamine patch placed behind your ear for the management of post- operative nausea and/or vomiting: ° °1. The medication in the patch is effective for 72 hours, after which it should be removed.  Wrap patch in a tissue and discard in the trash. Wash hands thoroughly with soap and water. °2. You may remove the patch earlier than 72 hours if you experience unpleasant side effects which may include dry mouth, dizziness or visual disturbances. °3. Avoid touching the patch. Wash your hands with soap and water after contact with the patch. °  ° °

## 2016-01-08 NOTE — Anesthesia Procedure Notes (Signed)
Procedure Name: LMA Insertion Date/Time: 01/08/2016 9:07 AM Performed by: Maryella Shivers Pre-anesthesia Checklist: Patient identified, Emergency Drugs available, Suction available and Patient being monitored Patient Re-evaluated:Patient Re-evaluated prior to inductionOxygen Delivery Method: Circle system utilized Preoxygenation: Pre-oxygenation with 100% oxygen Intubation Type: IV induction Ventilation: Mask ventilation without difficulty LMA: LMA inserted LMA Size: 4.0 Number of attempts: 1 Airway Equipment and Method: Bite block Placement Confirmation: positive ETCO2 Tube secured with: Tape Dental Injury: Teeth and Oropharynx as per pre-operative assessment

## 2016-01-08 NOTE — H&P (Signed)
19 yof who presents with no prior personal history of breast disease who presented several months ago she underwent routine evaluation with mm she had no mass or dc on exam. she has density c breasts. there is possible mass on left breast with negative right breast. left axillary [place] is negative. her mm shows a 2.8 cm mass and on [place] there is at 830 1 cm from the nipple a 2.5x2x2.5 cm mass. biopsy is grade I idc that is er pos, pr negative, her2 negative with oncotype of 52. her mri at beginning showed a 2.2x1.7x2.2 cm mass with no other disease. she has done well with chemotherapy and returns today. she finished last tuesday. her repeat mri shows really nearly complete response to chemotherapy.    Past Surgical History Rolm Bookbinder, MD; 12/23/2015 12:00 PM) Breast Biopsy  Left. Hysterectomy (not due to cancer) - Partial  Spinal Surgery - Neck  Tonsillectomy   Diagnostic Studies History Rolm Bookbinder, MD; 12/23/2015 12:00 PM) Colonoscopy  within last year  Allergies Nance Pear, [place]; 12/23/2015 10:04 AM) No Known Drug Allergies 06/30/2015  Medication History Nance Pear, CMA; 12/23/2015 10:05 AM) Hydrocodone-Acetaminophen (5-325MG Tablet, Oral) Active. Fluticasone Propionate (50MCG/ACT Suspension, Nasal) Active. Losartan Potassium (50MG Tablet, Oral) Active. Bisoprolol-Hydrochlorothiazide (10-6.25MG Tablet, Oral) Active. Estradiol (2MG Tablet, Oral) Active. Calcium Carbonate Active. Multiple Vitamin (Oral) Active. ALPRAZolam (0.5MG Tablet, Oral three times daily) Active. Desonide (0.05% Ointment, External) Active. Allergy Eye (0.025-0.3% Solution, Ophthalmic) Active. ZyrTEC (10MG Tablet, Oral) Active. Diclofenac Sodium (1% Gel, Transdermal) Active. TraMADol HCl (50MG Tablet, Oral) Active. Aspirin (81MG Tablet, Oral) Active. Medications Reconciled Hydrocodone-Acetaminophen (10-325MG Tablet, Oral as needed) Active. Ondansetron HCl (8MG  Tablet, Oral as needed) Active.  Social History Rolm Bookbinder, MD; 12/23/2015 12:00 PM) Alcohol use  Moderate alcohol use. Caffeine use  Carbonated beverages, Coffee, Tea. Illicit drug use  Remotely quit drug use. Tobacco use  Current some day smoker.  Family History Rolm Bookbinder, MD; 12/23/2015 12:00 PM) Hypertension  Mother. Respiratory Condition  Father. Thyroid problems  Sister.  Vitals Bary Castilla Bradford CMA; 12/23/2015 10:05 AM) 12/23/2015 10:05 AM Weight: 124.8 lb Height: 64in Body Surface Area: 1.6 m Body Mass Index: 21.42 kg/m  Temp.: 46F  Pulse: 75 (Regular)  BP: 112/72 (Sitting, Left Arm, Standard)       Physical Exam Rolm Bookbinder MD; 12/23/2015 12:00 PM) General Mental Status-Alert. Orientation-Oriented X3.  Chest and Lung Exam Chest and lung exam reveals -on auscultation, normal breath sounds, no adventitious sounds and normal vocal resonance.  Breast Nipples-No Discharge. Breast Lump-No Palpable Breast Mass.  Cardiovascular Cardiovascular examination reveals -normal heart sounds, regular rate and rhythm with no murmurs.  Lymphatic Head & Neck  General Head & Neck Lymphatics: Bilateral - Description - Normal. Axillary  General Axillary Region: Bilateral - Description - Normal. Note: no [place] adenopathy     Assessment & Plan Rolm Bookbinder MD; 12/23/2015 11:14 AM) BREAST CANCER OF LOWER-INNER QUADRANT OF LEFT FEMALE BREAST (C50.312) Story: Left breast seed guided lumpectomy, left axillary sn biopsy, port removal (if ok with oncology) We discussed a sentinel lymph node biopsy as she does not appear to having lymph node involvement right now. We discussed the performance of that with injection of radioactive tracer and blue dye. We discussed that she would have an incision underneath her axillary hairline. We discussed that there is a chance of having a positive node with a sentinel lymph node biopsy and  we will await the permanent pathology to make any other first further decisions in  terms of her treatment. One of these options might be to return to the operating room to perform an axillary lymph node dissection. We discussed up to a 5% risk lifetime of chronic shoulder pain as well as lymphedema associated with a sentinel lymph node biopsy. We discussed the options for treatment of the breast cancer which included lumpectomy versus a mastectomy. We discussed the performance of the lumpectomy with radioactive seed placement. We discussed a 5-10% chance of a positive margin requiring reexcision in the operating room. We also discussed that she will likely need radiation therapy if she undergoes lumpectomy. The breast cannot undergo more radiation therapy in the same breast after lumpectomy in the future. We discussed the mastectomy (removal of whole breast) and the postoperative care for that as well. Mastectomy can be followed by reconstruction. This is a more extensive surgery and requires more recovery. Most mastectomy patients will not need radiation therapy. We discussed that there is no difference in her survival whether she undergoes lumpectomy with radiation therapy or antiestrogen therapy versus a mastectomy. There is also no real difference between her recurrence in the breast. We discussed the risks of operation including bleeding, infection, possible reoperation.

## 2016-01-08 NOTE — Anesthesia Postprocedure Evaluation (Signed)
Anesthesia Post Note  Patient: Kim Ortega  Procedure(s) Performed: Procedure(s) (LRB): LEFT BREAST LUMPECTOMY WITH RADIOACTIVE SEED AND SENTINEL LYMPH NODE BIOPSY, BLUE DYE INJECTION (Left) REMOVAL PORT-A-CATH (Right)  Patient location during evaluation: PACU Anesthesia Type: General Level of consciousness: awake Pain management: pain level controlled Respiratory status: spontaneous breathing Cardiovascular status: stable Anesthetic complications: no       Last Vitals:  Vitals:   01/08/16 1130 01/08/16 1145  BP: 120/66 126/67  Pulse: 77 73  Resp: 14 14  Temp:  36.3 C    Last Pain:  Vitals:   01/08/16 1145  TempSrc:   PainSc: 3                  Nalah Macioce

## 2016-01-08 NOTE — Interval H&P Note (Signed)
History and Physical Interval Note:  01/08/2016 8:47 AM  Kim Ortega  has presented today for surgery, with the diagnosis of LEFT BREAST CANCER  The various methods of treatment have been discussed with the patient and family. After consideration of risks, benefits and other options for treatment, the patient has consented to  Procedure(s): LEFT BREAST LUMPECTOMY WITH RADIOACTIVE SEED AND SENTINEL LYMPH NODE BIOPSY, BLUE DYE INJECTION (Left) REMOVAL PORT-A-CATH (N/A) as a surgical intervention .  The patient's history has been reviewed, patient examined, no change in status, stable for surgery.  I have reviewed the patient's chart and labs.  Questions were answered to the patient's satisfaction.     Perkins Molina

## 2016-01-08 NOTE — Op Note (Signed)
Preoperative diagnosis: Right breast cancer, clinical stage II s/p primary chemotherapy Postoperative diagnosis: same as above Procedure: 1. Left breast seed guided lumpectomy 2. Left axillary sentinel nodebiopsy 3. Injection blue dye for sentinel node identification 4. Port removal Surgeon Dr Serita Grammes Anes general  EBL: minimal Comps none Specimen:  1. Left breast marked with paint 2. Left axillary sentinel nodes with highest count 5031 Sponge count correct at completion Dispoto recovery stable  Indications: This is a 59 yof with left breast cancer who has undergone primary chemotherapy.  repeat mri shows near complete radiologic resolution  We discussed options and elected to proceed with seed guided lumpectomy, sn biopsy and port removal which she desires and is ok with oncology. She had seed placed prior to surgery and I had mm available in OR.   Procedure: After informed consent was obtained the patient was taken to the OR. She was injected with technetium in the standard periareolar fashion. she was also injected with mixture of methylene blue dye and saline in periareolar fashion and massaged.  She was given anitibiotics. SCDs were in place. She was prepped and draped in the standard sterile surgical fashion. A timeout was performed. She had undergone a pectoral block. I first infiltrated marcaine in the prior port incision. I reentered the old incision. I removed the port and line in entirety.  Hemostasis observed. I closed this with 3-0 vicryl and 4-0 monocryl. Glue was placed.  I then located the seed in the left central retroareolarbreast.  I then infiltrated marcaine. I made an inferior periareolar incision in attempt to hide the scar.   I then removed the seedwith an attempt to get a clear margin. This waspassed off the table after marking with paint. The seed did come out of a hematoma cavity and this was sent separatelyI did confirm removal of theclipand seed  with mammography.the clip was directly in center of specimen. I placed clips in the cavity.  I obtained hemostasis.  I closed this with 2-0 vicryl, 3-0 vicryl and 4-0 monocryl. Glue was applied.  I then inftitrated marcaine in axilla. I made an incision below hairline and carried this through fascia.  I identified the sentinel nodes with the highest count as above. One node appeared to be blue also.there was no gross nodal disease. There was no background radioactivity or any further blue dye.. I obtained hemostasis. I then closed the fascia with 2-0 vicryl.  I closed the skin with 3-0 vicryl and 4-0 monocryl. Glue and steristrips were placed A breast binder was placed. She was extubated and transferred to recovery stable

## 2016-01-08 NOTE — Transfer of Care (Signed)
Immediate Anesthesia Transfer of Care Note  Patient: Kim Ortega  Procedure(s) Performed: Procedure(s): LEFT BREAST LUMPECTOMY WITH RADIOACTIVE SEED AND SENTINEL LYMPH NODE BIOPSY, BLUE DYE INJECTION (Left) REMOVAL PORT-A-CATH (Right)  Patient Location: PACU  Anesthesia Type:GA combined with regional for post-op pain  Level of Consciousness: sedated  Airway & Oxygen Therapy: Patient Spontanous Breathing and Patient connected to face mask oxygen  Post-op Assessment: Report given to RN and Post -op Vital signs reviewed and stable  Post vital signs: Reviewed and stable  Last Vitals:  Vitals:   01/08/16 0750 01/08/16 0755  BP:    Pulse: 70 74  Resp: 13 (!) 21  Temp:      Last Pain:  Vitals:   01/08/16 0651  TempSrc: Oral         Complications: No apparent anesthesia complications

## 2016-01-09 ENCOUNTER — Encounter (HOSPITAL_BASED_OUTPATIENT_CLINIC_OR_DEPARTMENT_OTHER): Payer: Self-pay | Admitting: General Surgery

## 2016-01-09 ENCOUNTER — Inpatient Hospital Stay: Admission: RE | Admit: 2016-01-09 | Payer: BC Managed Care – PPO | Source: Ambulatory Visit

## 2016-01-13 NOTE — Progress Notes (Addendum)
Follow up New Consult Breast  Patient: Kim Ortega 01/08/2016: Dr. Rolm Bookbinder, MD:  Procedure(s) Performed: Procedure(s) (LRB): LEFT BREAST LUMPECTOMY WITH RADIOACTIVE SEED AND SENTINEL LYMPH NODE BIOPSY, BLUE DYE INJECTION (Left) REMOVAL PORT-A-CATH (Right) Diagnosis 01/08/2016 : 1. Breast, lumpectomy, Left - INVASIVE GRADE 2 DUCTAL CARCINOMA. - RESIDUAL TUMOR FOCI PRESENT IN A FIBROTIC BED WITH NEOADJUVANT EFFECT. - RESIDUAL TUMOR FOCI MEASURE NO MORE THAN 0.2 CM IN GREATEST DIMENSION. - MARGINS ARE NEGATIVE. - SEE ONCOLOGY TEMPLATE. 2. Lymph node, sentinel, biopsy, Left axillary - ONE BENIGN LYMPH NODE WITH NO TUMOR SEE (0/1). Microscopic Comment 1. ONCOLOGY TEMPLATE: CARCINOMA OF THE BREAST, STATUS POST NEOADJUVANT THERAPY  01/15/2016 Dr. Lucien Mons  :  Started 07/30/15 completed  12/16/15: Neoadjuvant chemotherapy with dose dense Adriamycin AND CYTOXINX 4 FOLLOWED BY ABRAXANE WEEKLY X 2

## 2016-01-14 NOTE — Assessment & Plan Note (Signed)
Left breast biopsy 8:30 position: IDC grade 1, ER 90%, PR 0%, 50%, HER-2 Negative Ratio 1.21, Irregular Mass Left Breast 1 Cm from Nipple 2.5 x 2 x 2.5 Cm with Distortion,  T2 N0 Stage II a Clinical Stage Oncotype DX score 52, 34% risk of recurrence without chemotherapy  Treatment Summary: 1. Neoadjuvant chemotherapy with dose dense Adriamycin and Cytoxan 4 followed by Abraxane weekly 12; started 07/30/2015 completed 12/16/2015  2. 1/4/17Left Lumpectomy: IDC grade 1, 0.2 cm, Margins Neg, 0/1 LN Neg; ypT1aN0  Pathology counseling: I discussed the final pathology report of the patient provided  a copy of this report. I discussed the margins as well as lymph node surgeries. We also discussed the final staging along with previously performed ER/PR and HER-2/neu testing.  Plan: 1. Adjuvant radiation therapy followed by 2. Adjuvant anastrozole 1 mg daily 5-10 years  RTC after XRT to start Anti estrogen therapy

## 2016-01-15 ENCOUNTER — Ambulatory Visit (HOSPITAL_BASED_OUTPATIENT_CLINIC_OR_DEPARTMENT_OTHER): Payer: BC Managed Care – PPO | Admitting: Hematology and Oncology

## 2016-01-15 DIAGNOSIS — C50312 Malignant neoplasm of lower-inner quadrant of left female breast: Secondary | ICD-10-CM | POA: Diagnosis not present

## 2016-01-15 DIAGNOSIS — Z170 Estrogen receptor positive status [ER+]: Secondary | ICD-10-CM

## 2016-01-15 NOTE — Progress Notes (Signed)
Patient Care Team: [name], MD as PCP - General (Family Medicine)  DIAGNOSIS:  Encounter Diagnosis  Name Primary?  . Malignant neoplasm of lower-inner quadrant of left breast in female, estrogen receptor positive ([place])     SUMMARY OF ONCOLOGIC HISTORY:   Breast cancer of lower-inner quadrant of left female breast ([place])   06/25/2015 Initial Diagnosis    Left breast biopsy 8:30 position: IDC grade 1, ER 90%, PR 0%, 50%, HER-2 Negative Ratio 1.21, Irregular Mass Left Breast 1 Cm from Nipple 2.5 x 2 x 2.5 Cm with Distortion, T2 N0 Stage II a Clinical Stage, Oncotype DX score 52, 34% ROR      07/30/2015 - 12/16/2015 Neo-Adjuvant Chemotherapy    Neoadjuvant chemotherapy with dose dense Adriamycin and Cytoxan 4 followed by Abraxane weekly 12      12/18/2015 Breast MRI    Excellent interval response to chemotherapy with only minimal residual enhancement, 2 tiny rounded regions along superior margin, unchanged, no abnormal lymph nodes       01/08/2016 Surgery    Left Lumpectomy: IDC grade 1, 0.2 cm, Margins Neg, 0/1 LN Neg; ypT1aN0       CHIEF COMPLIANT: Follow-up after recent left lumpectomy  INTERVAL HISTORY: Kim Ortega is a 61 year old with above-mentioned history left breast cancer treated with neoadjuvant chemotherapy and recently underwent lumpectomy. She is recovering very well from the recent surgery. She does have mild to moderate degree of pain and discomfort especially underneath the axilla.  REVIEW OF SYSTEMS:   Constitutional: Denies fevers, chills or abnormal weight loss Eyes: Denies blurriness of vision Ears, nose, mouth, throat, and face: Denies mucositis or sore throat Respiratory: Denies cough, dyspnea or wheezes Cardiovascular: Denies palpitation, chest discomfort Gastrointestinal:  Denies nausea, heartburn or change in bowel habits Skin: Denies abnormal skin rashes Lymphatics: Denies new lymphadenopathy or easy bruising Neurological:Denies  numbness, tingling or new weaknesses Behavioral/Psych: Mood is stable, no new changes  Extremities: No lower extremity edema Breast: Recent left lumpectomy All other systems were reviewed with the patient and are negative.  I have reviewed the past medical history, past surgical history, social history and family history with the patient and they are unchanged from previous note.  ALLERGIES:  has No Known Allergies.  MEDICATIONS:  Current Outpatient Prescriptions  Medication Sig Dispense Refill  . ALPRAZolam (XANAX) 0.5 MG tablet Take 0.5 mg by mouth 3 (three) times daily as needed for anxiety.    [name] anastrozole (ARIMIDEX) 1 MG tablet Take 1 tablet (1 mg total) by mouth daily. 30 tablet 0  . aspirin EC 81 MG tablet Take 81 mg by mouth daily.    . bisoprolol-hydrochlorothiazide (ZIAC) 10-6.25 MG tablet TAKE 1 TABLET BY MOUTH DAILY. 90 tablet 3  . Calcium Carbonate-Vitamin D (CALCIUM-D PO) Take 1 tablet by mouth 2 (two) times daily.     . cetirizine (ZYRTEC) 10 MG tablet Take 10 mg by mouth daily.    . clobetasol (OLUX) 0.05 % topical foam Apply 1 application topically daily as needed (psoriasis).   2  . desonide (DESOWEN) 0.05 % cream Apply 1 application topically daily. For psoriasis    . diclofenac sodium (VOLTAREN) 1 % GEL Apply 2 g topically 3 (three) times daily as needed (Pain).     . fluticasone (FLONASE) 50 MCG/ACT nasal spray INHALE 2 SPRAYS IN EACH NOSTRIL DAILY 16 g 11  . HYDROcodone-acetaminophen (NORCO) 10-325 MG tablet Take 1 tablet by mouth every 6 (six) hours as needed. 120 tablet 0  .  HYDROcodone-acetaminophen (NORCO) 10-325 MG tablet Take 1 tablet by mouth every 6 (six) hours as needed. 10 tablet 0  . ibuprofen (ADVIL,MOTRIN) 200 MG tablet Take 400 mg by mouth every 6 (six) hours as needed for headache (pain).    . Ketotifen Fumarate (ALLERGY EYE DROPS OP) Place 1 drop into both eyes 2 (two) times daily as needed (allergies).     . losartan (COZAAR) 50 MG tablet TAKE 1  TABLET BY MOUTH ONCE DAILY. 90 tablet 3  . Multiple Vitamin (MULTIVITAMIN WITH MINERALS) TABS tablet Take 1 tablet by mouth daily.    . ondansetron (ZOFRAN) 8 MG tablet Take 1 tablet (8 mg) twice daily as needed for nausea/vomiting 60 tablet 2  . tiZANidine (ZANAFLEX) 4 MG tablet Take 4 mg by mouth at bedtime as needed (back pain).   2   No current facility-administered medications for this visit.     PHYSICAL EXAMINATION: ECOG PERFORMANCE STATUS: 1 - Symptomatic but completely ambulatory  Vitals:   01/15/16 1429  BP: 123/80  Pulse: 72  Resp: 18  Temp: 97.9 F (36.6 C)   Filed Weights   01/15/16 1429  Weight: 120 lb 14.4 oz (54.8 kg)    GENERAL:alert, no distress and comfortable SKIN: skin color, texture, turgor are normal, no rashes or significant lesions EYES: normal, Conjunctiva are pink and non-injected, sclera clear OROPHARYNX:no exudate, no erythema and lips, buccal mucosa, and tongue normal  NECK: supple, thyroid normal size, non-tender, without nodularity LYMPH:  no palpable lymphadenopathy in the cervical, axillary or inguinal LUNGS: clear to auscultation and percussion with normal breathing effort HEART: regular rate & rhythm and no murmurs and no lower extremity edema ABDOMEN:abdomen soft, non-tender and normal bowel sounds MUSCULOSKELETAL:no cyanosis of digits and no clubbing  NEURO: alert & oriented x 3 with fluent speech, no focal motor/sensory deficits EXTREMITIES: No lower extremity edema   LABORATORY DATA:  I have reviewed the data as listed   Chemistry      Component Value Date/Time   NA 139 12/16/2015 0947   K 4.2 12/16/2015 0947   CL 104 07/21/2015 1500   CO2 26 12/16/2015 0947   BUN 8.6 12/16/2015 0947   CREATININE 0.8 12/16/2015 0947      Component Value Date/Time   CALCIUM 9.6 12/16/2015 0947   ALKPHOS 54 12/16/2015 0947   AST 22 12/16/2015 0947   ALT 20 12/16/2015 0947   BILITOT 0.40 12/16/2015 0947       Lab Results  Component  Value Date   WBC 5.3 12/16/2015   HGB 12.2 12/16/2015   HCT 36.2 12/16/2015   MCV 110.6 (H) 12/16/2015   PLT 245 12/16/2015   NEUTROABS 3.8 12/16/2015    ASSESSMENT & PLAN:  Breast cancer of lower-inner quadrant of left female breast ([facility]) Left breast biopsy 8:30 position: IDC grade 1, ER 90%, PR 0%, 50%, HER-2 Negative Ratio 1.21, Irregular Mass Left Breast 1 Cm from Nipple 2.5 x 2 x 2.5 Cm with Distortion,  T2 N0 Stage II a Clinical Stage Oncotype DX score 52, 34% risk of recurrence without chemotherapy  Treatment Summary: 1. Neoadjuvant chemotherapy with dose dense Adriamycin and Cytoxan 4 followed by Abraxane weekly 12; started 07/30/2015 completed 12/16/2015  2. 1/4/17Left Lumpectomy: IDC grade 1, 0.2 cm, Margins Neg, 0/1 LN Neg; ypT1aN0  Pathology counseling: I discussed the final pathology report of the patient provided  a copy of this report. I discussed the margins as well as lymph node surgeries. We also discussed the final  staging along with previously performed ER/PR and HER-2/neu testing.  Plan: 1. Adjuvant radiation therapy followed by 2. Adjuvant anastrozole 1 mg daily 5-10 years   patient has anastrozole pills which she will start after radiation is complete.  I will see the patient  In May for follow-up with labs.   I spent 25 minutes talking to the patient of which more than half was spent in counseling and coordination of care.  Orders Placed This Encounter  Procedures  . CBC with Differential    Standing Status:   Future    Standing Expiration Date:   01/14/2017  . Comprehensive metabolic panel    Standing Status:   Future    Standing Expiration Date:   01/14/2017   The patient has a good understanding of the overall plan. she agrees with it. she will call with any problems that may develop before the next visit here.   Rulon Eisenmenger, MD 01/15/16

## 2016-01-21 ENCOUNTER — Ambulatory Visit: Payer: BC Managed Care – PPO

## 2016-01-21 ENCOUNTER — Ambulatory Visit
Admission: RE | Admit: 2016-01-21 | Discharge: 2016-01-21 | Disposition: A | Discharge status: Home / Self Care | Payer: BC Managed Care – PPO | Source: Ambulatory Visit | Attending: Radiation Oncology | Admitting: Radiation Oncology

## 2016-01-21 DIAGNOSIS — Z823 Family history of stroke: Secondary | ICD-10-CM | POA: Insufficient documentation

## 2016-01-21 DIAGNOSIS — I10 Essential (primary) hypertension: Secondary | ICD-10-CM | POA: Insufficient documentation

## 2016-01-21 DIAGNOSIS — F17210 Nicotine dependence, cigarettes, uncomplicated: Secondary | ICD-10-CM | POA: Insufficient documentation

## 2016-01-21 DIAGNOSIS — Z170 Estrogen receptor positive status [ER+]: Secondary | ICD-10-CM | POA: Insufficient documentation

## 2016-01-21 DIAGNOSIS — Z79811 Long term (current) use of aromatase inhibitors: Secondary | ICD-10-CM | POA: Insufficient documentation

## 2016-01-21 DIAGNOSIS — Z79899 Other long term (current) drug therapy: Secondary | ICD-10-CM | POA: Insufficient documentation

## 2016-01-21 DIAGNOSIS — C50312 Malignant neoplasm of lower-inner quadrant of left female breast: Secondary | ICD-10-CM | POA: Insufficient documentation

## 2016-01-21 DIAGNOSIS — Z7982 Long term (current) use of aspirin: Secondary | ICD-10-CM | POA: Insufficient documentation

## 2016-01-21 DIAGNOSIS — L4050 Arthropathic psoriasis, unspecified: Secondary | ICD-10-CM | POA: Insufficient documentation

## 2016-01-21 DIAGNOSIS — Z510 Encounter for antineoplastic radiation therapy: Secondary | ICD-10-CM | POA: Insufficient documentation

## 2016-01-26 ENCOUNTER — Ambulatory Visit
Admission: RE | Admit: 2016-01-26 | Discharge: 2016-01-26 | Disposition: A | Discharge status: Home / Self Care | Payer: BC Managed Care – PPO | Source: Ambulatory Visit | Attending: Radiation Oncology | Admitting: Radiation Oncology

## 2016-01-26 ENCOUNTER — Encounter: Payer: Self-pay | Admitting: Radiation Oncology

## 2016-01-26 VITALS — BP 121/67 | HR 67 | Temp 98.2°F | Resp 16 | Ht 64.0 in | Wt 120.2 lb

## 2016-01-26 DIAGNOSIS — Z79811 Long term (current) use of aromatase inhibitors: Secondary | ICD-10-CM | POA: Diagnosis not present

## 2016-01-26 DIAGNOSIS — Z510 Encounter for antineoplastic radiation therapy: Secondary | ICD-10-CM | POA: Diagnosis present

## 2016-01-26 DIAGNOSIS — Z7982 Long term (current) use of aspirin: Secondary | ICD-10-CM | POA: Diagnosis not present

## 2016-01-26 DIAGNOSIS — F17210 Nicotine dependence, cigarettes, uncomplicated: Secondary | ICD-10-CM | POA: Diagnosis not present

## 2016-01-26 DIAGNOSIS — Z79899 Other long term (current) drug therapy: Secondary | ICD-10-CM | POA: Diagnosis not present

## 2016-01-26 DIAGNOSIS — Z170 Estrogen receptor positive status [ER+]: Principal | ICD-10-CM

## 2016-01-26 DIAGNOSIS — C50312 Malignant neoplasm of lower-inner quadrant of left female breast: Secondary | ICD-10-CM | POA: Diagnosis not present

## 2016-01-26 DIAGNOSIS — Z823 Family history of stroke: Secondary | ICD-10-CM | POA: Diagnosis not present

## 2016-01-26 DIAGNOSIS — L4050 Arthropathic psoriasis, unspecified: Secondary | ICD-10-CM | POA: Diagnosis not present

## 2016-01-26 DIAGNOSIS — I10 Essential (primary) hypertension: Secondary | ICD-10-CM | POA: Diagnosis not present

## 2016-01-26 NOTE — Addendum Note (Signed)
Encounter addended by: Doreen Beam, RN on: 01/26/2016  9:51 AM<BR>    Actions taken: Patient Education assessment filed

## 2016-01-26 NOTE — Progress Notes (Signed)
Radiation Oncology         (336) [phone] ________________________________  Name: Kim Ortega MRN: [id number]  Date: 01/26/2016  DOB: 08-06-1955  RA:[id number] TOM, MD  [name], MD     REFERRING PHYSICIAN: [name], MD   DIAGNOSIS: The encounter diagnosis was Malignant neoplasm of lower-inner quadrant of left breast in female, estrogen receptor positive ([place]).    HISTORY OF PRESENT ILLNESS: Kim Ortega is a 61 y.o. female who had a routine screening mammogram, on 06/11/15, that detected a possible mass in the left breast. Diagnostic left breast mammogram and ultrasound on 06/24/15 showed a 2.5 x 2 x 2.5 cm irregular hypoechoic mass in the 8:30 o'clock position. No left axilla lymphadenopathy was noted.  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  She was present at the breast tumor board and the recommendation was to do Oncotype DX on the biopsy. As there was insufficient tissue for the Oncotype test, she was placed on Arimidex in the interim and rebiopsided on 06/25/15 consistent with her prior biopsy and her Oncotype was 52. She completed neoadjuvant chemotherapy on 11/25/15 with Abraxane. She subsequently went on to proceed to the OR for left breast lumpectomy with sentinel node evaluation on 01/08/16. She comes today to discuss the role of adjuvant radiotherapy.  PREVIOUS RADIATION THERAPY: No   PAST MEDICAL HISTORY:  Past Medical History:  Diagnosis Date  . Allergy   . Anxiety    mild no regular meds  . Arthritis    psoriatic arthritis -back  . Breast cancer ([place]) 06/25/15 bx   left breastinvasive ductal ca ,grade 1  . Episcleritis   . Hormone replacement therapy   . Hypertension   . Left carotid artery stenosis    40-59% (01/2015)  . Psoriasis   . Smoker        PAST SURGICAL HISTORY: Past  Surgical History:  Procedure Laterality Date  . ABDOMINAL HYSTERECTOMY    . BREAST LUMPECTOMY WITH RADIOACTIVE SEED AND SENTINEL LYMPH NODE BIOPSY Left 01/08/2016   Procedure: LEFT BREAST LUMPECTOMY WITH RADIOACTIVE SEED AND SENTINEL LYMPH NODE BIOPSY, BLUE DYE INJECTION;  Surgeon: Rolm Bookbinder, MD;  Location: [facility];  Service: General;  Laterality: Left;  . cervical acdf     C5-6- fusion with plates/screws "Dr. Sherwood Gambler" '12  . COLONOSCOPY WITH PROPOFOL N/A 04/16/2014   Procedure: COLONOSCOPY WITH PROPOFOL;  Surgeon: Garlan Fair, MD;  Location: [facility];  Service: Endoscopy;  Laterality: N/A;  . COLPOSCOPY    . laporotomy     Ovarian cyst  . PORT-A-CATH REMOVAL Right 01/08/2016   Procedure: REMOVAL PORT-A-CATH;  Surgeon: Rolm Bookbinder, MD;  Location: [facility];  Service: General;  Laterality: Right;  . PORTACATH PLACEMENT N/A 07/24/2015   Procedure: INSERTION PORT-A-CATH WITH [place];  Surgeon: Rolm Bookbinder, MD;  Location: [facility];  Service: General;  Laterality: N/A;  . TONSILLECTOMY     child     FAMILY HISTORY:  Family History  Problem Relation Age of Onset  . Stroke Mother      SOCIAL HISTORY:  reports that she has been smoking Cigarettes.  She has a 10.00 pack-year smoking history. She has never used smokeless tobacco. She reports that she drinks about 6.0 oz of alcohol per week . She reports that she does not use drugs. The patient is married and lives  in [place]. 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   ALLERGIES: Patient has no known allergies.   MEDICATIONS:  Current Outpatient Prescriptions  Medication Sig Dispense Refill  . ALPRAZolam (XANAX) 0.5 MG tablet Take 0.5 mg by mouth 3 (three) times daily as needed for anxiety.    [name] aspirin EC 81 MG tablet Take 81 mg by mouth daily.    . bisoprolol-hydrochlorothiazide (ZIAC) 10-6.25 MG tablet TAKE 1 TABLET BY MOUTH DAILY. 90 tablet 3  . Calcium  Carbonate-Vitamin D (CALCIUM-D PO) Take 1 tablet by mouth 2 (two) times daily.     . cetirizine (ZYRTEC) 10 MG tablet Take 10 mg by mouth daily.    . clobetasol (OLUX) 0.05 % topical foam Apply 1 application topically daily as needed (psoriasis).   2  . desonide (DESOWEN) 0.05 % cream Apply 1 application topically daily. For psoriasis    . diclofenac sodium (VOLTAREN) 1 % GEL Apply 2 g topically 3 (three) times daily as needed (Pain).     . fluticasone (FLONASE) 50 MCG/ACT nasal spray INHALE 2 SPRAYS IN EACH NOSTRIL DAILY 16 g 11  . ibuprofen (ADVIL,MOTRIN) 200 MG tablet Take 400 mg by mouth every 6 (six) hours as needed for headache (pain).    . Ketotifen Fumarate (ALLERGY EYE DROPS OP) Place 1 drop into both eyes 2 (two) times daily as needed (allergies).     . losartan (COZAAR) 50 MG tablet TAKE 1 TABLET BY MOUTH ONCE DAILY. 90 tablet 3  . Multiple Vitamin (MULTIVITAMIN WITH MINERALS) TABS tablet Take 1 tablet by mouth daily.    [name] tiZANidine (ZANAFLEX) 4 MG tablet Take 4 mg by mouth at bedtime as needed (back pain).   2  . anastrozole (ARIMIDEX) 1 MG tablet Take 1 tablet (1 mg total) by mouth daily. (Patient not taking: Reported on 01/26/2016) 30 tablet 0  . ondansetron (ZOFRAN) 8 MG tablet Take 1 tablet (8 mg) twice daily as needed for nausea/vomiting (Patient not taking: Reported on 01/26/2016) 60 tablet 2   No current facility-administered medications for this encounter.      REVIEW OF SYSTEMS: On review of systems, the patient reports that she is doing well overall. She is recovering from the fatigue of chemotherapy. She denies any chest pain, shortness of breath, cough, fevers, chills, night sweats, unintended weight changes. She denies any bowel or bladder disturbances, and denies abdominal pain, nausea or vomiting. She did well with zofran during chemotherapy treatments, and does not have any nausea currently. Her hair has started growing back since treatment. She denies any new  musculoskeletal or joint aches or pains. A complete review of systems is obtained and is otherwise negative.   PHYSICAL EXAM:  height is _0  (1.626 m) and weight is 120 lb 3.2 oz (54.5 kg). Her oral temperature is 98.2 F (36.8 C). Her blood pressure is 121/67 and her pulse is 67. Her respiration is 16.   In general this is a well appearing caucasian female in no acute distress. She's alert and oriented x4 and appropriate throughout the examination. Cardiopulmonary assessment is negative for acute distress and she exhibits normal effort. The left breast incision is intact without cellulitic change, separation or edema. The axillary incision is intact as well without any anomalies. There is minimal erythema above the areola consistent with capillary beds.  ECOG = 1  0 - Asymptomatic (Fully active, able to carry on all predisease activities without restriction)  1 - Symptomatic but completely  ambulatory (Restricted in physically strenuous activity but ambulatory and able to carry out work of a light or sedentary nature. For example, light housework, office work)  2 - Symptomatic, <50% in bed during the day (Ambulatory and capable of all self care but unable to carry out any work activities. Up and about more than 50% of waking hours)  3 - Symptomatic, >50% in bed, but not bedbound (Capable of only limited self-care, confined to bed or chair 50% or more of waking hours)  4 - Bedbound (Completely disabled. Cannot carry on any self-care. Totally confined to bed or chair)  5 - Death   Eustace Pen MM, Creech RH, Tormey DC, et al. [phone]). "Toxicity and response criteria of the [facility]". [place] Oncol. 5 (6): 649-55    LABORATORY DATA:  Lab Results  Component Value Date   WBC 5.3 12/16/2015   HGB 12.2 12/16/2015   HCT 36.2 12/16/2015   MCV 110.6 (H) 12/16/2015   PLT 245 12/16/2015   Lab Results  Component Value Date   NA 139 12/16/2015   K 4.2 12/16/2015   CL  104 07/21/2015   CO2 26 12/16/2015   Lab Results  Component Value Date   ALT 20 12/16/2015   AST 22 12/16/2015   ALKPHOS 54 12/16/2015   BILITOT 0.40 12/16/2015      RADIOGRAPHY: Mm Breast Surgical Specimen  Result Date: 01/08/2016 CLINICAL DATA:  Patient is post radiation seed localization with subsequent surgical excision left breast for invasive mammary carcinoma. EXAM: SPECIMEN RADIOGRAPH OF THE LEFT BREAST COMPARISON:  Previous exam(s). FINDINGS: Status post excision of the left breast. The radioactive seed and biopsy marker clip are present, completely intact, and were marked for pathology. Note that the reactive seed is present in a specimen cup separate from the specimen, although the targeted ribbon shaped metallic clip is present within the center of the specimen. Findings were called to the OR at the time of dictation. IMPRESSION: Specimen radiograph of the left breast. Electronically Signed   By: Marin Olp M.D.   On: 01/08/2016 10:11   Mm Lt Radioactive Seed Loc Mammo Guide  Result Date: 01/07/2016 CLINICAL DATA:  Patient with left breast cancer scheduled for breast conservation surgery requiring preoperative radioactive seed localization. EXAM: MAMMOGRAPHIC GUIDED RADIOACTIVE SEED LOCALIZATION OF THE LEFT BREAST COMPARISON:  Previous exam(s). FINDINGS: Patient presents for radioactive seed localization prior to breast conservation surgery. I met with the patient and we discussed the procedure of seed localization including benefits and alternatives. We discussed the high likelihood of a successful procedure. We discussed the risks of the procedure including infection, bleeding, tissue injury and further surgery. We discussed the low dose of radioactivity involved in the procedure. Informed, written consent was given. The usual time-out protocol was performed immediately prior to the procedure. Using mammographic guidance, sterile technique, 1% lidocaine and an I-125 radioactive seed,  the ribbon shaped clip within the inner left breast was localized using a medial approach. The follow-up mammogram images confirm the seed in the expected location and were marked for Dr. [name]. Follow-up survey of the patient confirms presence of the radioactive seed. Order number of I-125 seed:  [id number]. Total activity:  5.852 millicuries  Reference Date: 12/22/2015 The patient tolerated the procedure well and was released from the [facility]. She was given instructions regarding seed removal. IMPRESSION: Radioactive seed localization left breast. No apparent complications. Electronically Signed   By: Franki Cabot M.D.   On: 01/07/2016 15:56  IMPRESSION/PLAN:  1. 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 Dr. Lisbeth Renshaw discusses the pathology findings and reviews the nature of invasive breast disease taking into consideration her previous therapies. Dr. Lisbeth Renshaw recommends proceeding now with external radiotherapy to the breast followed by antiestrogen therapy. We discussed the risks, benefits, short, and long term effects of radiotherapy, and the patient is interested in proceeding. Dr. Lisbeth Renshaw discusses the delivery and logistics of radiotherapy and would recommend 6 1/2 weeks of treatment to total 28 fractions. We will proceed with simulation on Monday 02/02/16 with breath hold technique.     The above documentation reflects my direct findings during this shared patient visit. Please see the separate note by Dr. Lisbeth Renshaw on this date for the remainder of the patient's plan of care.     Carola Rhine, PAC

## 2016-01-26 NOTE — Progress Notes (Signed)
Follow up New Consult Breast  Patient: Kim Ortega 01/08/2016: Dr. Rolm Bookbinder, MD:  Procedure(s) Performed: Procedure(s) (LRB): Dr. [name], MD LEFT BREAST LUMPECTOMY WITH RADIOACTIVE SEED AND SENTINEL LYMPH NODE BIOPSY, BLUE DYE INJECTION (Left) REMOVAL PORT-A-CATH (Right) Diagnosis 01/08/2016 : 1. Breast, lumpectomy, Left - INVASIVE GRADE 2 DUCTAL CARCINOMA. - RESIDUAL TUMOR FOCI PRESENT IN A FIBROTIC BED WITH NEOADJUVANT EFFECT. - RESIDUAL TUMOR FOCI MEASURE NO MORE THAN 0.2 CM IN GREATEST DIMENSION. - MARGINS ARE NEGATIVE. - SEE ONCOLOGY TEMPLATE. 2. Lymph node, sentinel, biopsy, Left axillary - ONE BENIGN LYMPH NODE WITH NO TUMOR SEE (0/1). Microscopic Comment 1. ONCOLOGY TEMPLATE: CARCINOMA OF THE BREAST, STATUS POST NEOADJUVANT THERAPY  01/15/2016 Dr. Lucien Mons  :  Started 07/30/15 completed  12/16/15: Neoadjuvant chemotherapy with dose dense Adriamycin AND CYTOXINX 4 FOLLOWED BY ABRAXANE WEEKLY X 2  Still smoking 1/2ppd cigarettes, tenderness in left breast, has glue still,  Redness above incision, sees Dr. [name] this Wednesday   7:50 AM BP 121/67 (BP Location: Right Arm, Patient Position: Sitting, Cuff Size: Normal)   Pulse 67   Temp 98.2 F (36.8 C) (Oral)   Resp 16   Ht 5\' 4"  (1.626 m)   Wt 120 lb 3.2 oz (54.5 kg)   BMI 20.63 kg/m   Wt Readings from Last 3 Encounters:  01/26/16 120 lb 3.2 oz (54.5 kg)  01/15/16 120 lb 14.4 oz (54.8 kg)  01/08/16 120 lb (54.4 kg)

## 2016-01-26 NOTE — Progress Notes (Signed)
Please see the Nurse Progress Note in the MD Initial Consult Encounter for this patient. 

## 2016-02-02 ENCOUNTER — Ambulatory Visit
Admission: RE | Admit: 2016-02-02 | Discharge: 2016-02-02 | Disposition: A | Discharge status: Home / Self Care | Payer: BC Managed Care – PPO | Source: Ambulatory Visit | Attending: Radiation Oncology | Admitting: Radiation Oncology

## 2016-02-02 DIAGNOSIS — Z170 Estrogen receptor positive status [ER+]: Principal | ICD-10-CM

## 2016-02-02 DIAGNOSIS — C50312 Malignant neoplasm of lower-inner quadrant of left female breast: Secondary | ICD-10-CM

## 2016-02-02 DIAGNOSIS — Z510 Encounter for antineoplastic radiation therapy: Secondary | ICD-10-CM | POA: Diagnosis not present

## 2016-02-02 NOTE — Progress Notes (Signed)
  Radiation Oncology         (336) [phone] ________________________________  Name: Kim Ortega MRN: [id number]  Date: 02/02/2016  DOB: January 12, 1955  DIAGNOSIS:     ICD-9-CM ICD-10-CM   1. Malignant neoplasm of lower-inner quadrant of left breast in female, estrogen receptor positive ([facility]) 174.3 C50.312    V86.0 Z17.0      SIMULATION AND TREATMENT PLANNING NOTE  The patient presented for simulation prior to beginning her course of radiation treatment for her diagnosis of left-sided breast cancer. The patient was placed in a supine position on a breast board. A customized vac-lock bag was constructed and this complex treatment device will be used on a daily basis during her treatment. In this fashion, a CT scan was obtained through the chest area and an isocenter was placed near the chest wall within the breast.  The patient will be planned to receive a course of radiation initially to a dose of 50.4 Gy. This will consist of a whole breast radiotherapy technique. To accomplish this, 2 customized blocks have been designed which will correspond to medial and lateral whole breast tangent fields. This treatment will be accomplished at 1.8 Gy per fraction. A forward planning technique will also be evaluated to determine if this approach improves the plan. It is anticipated that the patient will then receive a 10 Gy boost to the seroma cavity which has been contoured. This will be accomplished at 2 Gy per fraction.   This initial treatment will consist of a 3-D conformal technique. The seroma has been contoured as the primary target structure. Additionally, dose volume histograms of both this target as well as the lungs and heart will also be evaluated. Such an approach is necessary to ensure that the target area is adequately covered while the nearby critical  normal structures are adequately spared.  Plan:  The final anticipated total dose therefore will correspond to 60.4 Gy.   Special treatment  procedure was performed today due to the extra time and effort required by myself to plan and prepare this patient for deep inspiration breath hold technique.  I have determined cardiac sparing to be of benefit to this patient to prevent long term cardiac damage due to radiation of the heart.  Bellows were placed on the patient's abdomen. To facilitate cardiac sparing, the patient was coached by the radiation therapists on breath hold techniques and breathing practice was performed. Practice waveforms were obtained. The patient was then scanned while maintaining breath hold in the treatment position.  This image was then transferred over to the imaging specialist. The imaging specialist then created a fusion of the free breathing and breath hold scans using the chest wall as the stable structure. I personally reviewed the fusion in axial, coronal and sagittal image planes.  Excellent cardiac sparing was obtained.  I felt the patient is an appropriate candidate for breath hold and the patient will be treated as such.  The image fusion was then reviewed with the patient to reinforce the necessity of reproducible breath hold.      _______________________________   Jodelle Gross, MD, PhD

## 2016-02-02 NOTE — Progress Notes (Signed)
  Radiation Oncology         (336) [phone] ________________________________  Name: Kim Ortega MRN: [id number]  Date: 02/02/2016  DOB: 06/03/1955  Optical Surface Tracking Plan:  Since intensity modulated radiotherapy (IMRT) and 3D conformal radiation treatment methods are predicated on accurate and precise positioning for treatment, intrafraction motion monitoring is medically necessary to ensure accurate and safe treatment delivery.  The ability to quantify intrafraction motion without excessive ionizing radiation dose can only be performed with optical surface tracking. Accordingly, surface imaging offers the opportunity to obtain 3D measurements of patient position throughout IMRT and 3D treatments without excessive radiation exposure.  I am ordering optical surface tracking for this patient's upcoming course of radiotherapy. ________________________________  Kyung Rudd, MD 02/02/2016 2:41 PM    Reference:   Ursula Alert, J, et al. Surface imaging-based analysis of intrafraction motion for breast radiotherapy patients.Journal of [place], n. 6, nov. 2014. ISSN [id number].   Available at: <http://www.jacmp.org/index.php/jacmp/article/view/4957>.

## 2016-02-03 ENCOUNTER — Encounter: Payer: Self-pay | Admitting: Physical Therapy

## 2016-02-03 ENCOUNTER — Ambulatory Visit: Payer: BC Managed Care – PPO | Attending: General Surgery | Admitting: Physical Therapy

## 2016-02-03 DIAGNOSIS — M25512 Pain in left shoulder: Secondary | ICD-10-CM | POA: Insufficient documentation

## 2016-02-03 DIAGNOSIS — M6281 Muscle weakness (generalized): Secondary | ICD-10-CM | POA: Diagnosis present

## 2016-02-03 DIAGNOSIS — M25612 Stiffness of left shoulder, not elsewhere classified: Secondary | ICD-10-CM

## 2016-02-03 NOTE — Therapy (Signed)
[place], [place], [address] Phone: [phone]   Fax:  [phone]  Physical Therapy Evaluation  Patient Details  Name: Kim Ortega MRN: [id number] Date of Birth: 10-30-1955 Referring Provider: [name]  Encounter Date: 02/03/2016      PT End of Session - 02/03/16 1522    Visit Number 1   Number of Visits 5   Date for PT Re-Evaluation 03/02/16   PT Start Time [id number]   PT Stop Time 1517   PT Time Calculation (min) 39 min   Activity Tolerance Patient tolerated treatment well   Behavior During Therapy [facility] for tasks assessed/performed      Past Medical History:  Diagnosis Date  . Allergy   . Anxiety    mild no regular meds  . Arthritis    psoriatic arthritis -back  . Breast cancer ([place]) 06/25/15 bx   left breastinvasive ductal ca ,grade 1  . Episcleritis   . Hormone replacement therapy   . Hypertension   . Left carotid artery stenosis    40-59% (01/2015)  . Psoriasis   . Smoker     Past Surgical History:  Procedure Laterality Date  . ABDOMINAL HYSTERECTOMY    . BREAST LUMPECTOMY WITH RADIOACTIVE SEED AND SENTINEL LYMPH NODE BIOPSY Left 01/08/2016   Procedure: LEFT BREAST LUMPECTOMY WITH RADIOACTIVE SEED AND SENTINEL LYMPH NODE BIOPSY, BLUE DYE INJECTION;  Surgeon: Rolm Bookbinder, MD;  Location: [facility];  Service: General;  Laterality: Left;  . cervical acdf     C5-6- fusion with plates/screws "Dr. Sherwood Gambler" '12  . COLONOSCOPY WITH PROPOFOL N/A 04/16/2014   Procedure: COLONOSCOPY WITH PROPOFOL;  Surgeon: Garlan Fair, MD;  [facility];  Service: Endoscopy;  Laterality: N/A;  . COLPOSCOPY    . laporotomy     Ovarian cyst  . PORT-A-CATH REMOVAL Right 01/08/2016   Procedure: REMOVAL PORT-A-CATH;  Surgeon: Rolm Bookbinder, MD;  Location: [facility];  Service: General;  Laterality: Right;  . PORTACATH PLACEMENT N/A 07/24/2015   Procedure:  INSERTION PORT-A-CATH WITH [place];  Surgeon: Rolm Bookbinder, MD;  Location: [facility];  Service: General;  Laterality: N/A;  . TONSILLECTOMY     child    There were no vitals filed for this visit.       Subjective Assessment - 02/03/16 1443    Subjective Patient underwent a left lumpectomy and a sentinel lymph node biopsy on Jan 4th, 2018. All lymph nodes were negative. Pt states she has been doing post surgical exercises religiously. Patient does not feel like she is having any limitations with range of motion but does feel tightness in left axilla and some aching down her left arm. She also reports some mild post surgical swelling. She reports her lumpectomy scar has nearly healed and that her lymph node scar bothers her more.    Pertinent History left breast cancer with lumpectomy and SLNB on 01/08/16, underwent chemotherapy, pt to start radiation next week   Patient Stated Goals to move my arm normally   Currently in Pain? No/denies   Pain Score 0-No pain            [facility] PT Assessment - 02/03/16 0001      Assessment   Medical Diagnosis left breast cancer   Referring Provider [name]   Onset Date/Surgical Date 01/08/16   Hand Dominance Right   Prior Therapy none     Precautions   Precautions Other (comment)  at risk for  lymphedema     Restrictions   Weight Bearing Restrictions No     Balance Screen   Has the patient fallen in the past 6 months No   Has the patient had a decrease in activity level because of a fear of falling?  No   Is the patient reluctant to leave their home because of a fear of falling?  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, pt reports she does not have to do any lifting, pt does a lot of desk work and sees patient   Leisure pt reports she exercises in the morning daily including stationary bike     Cognition   Overall Cognitive Status Within Functional Limits for tasks assessed     Observation/Other Assessments   Skin Integrity both scars are healing well, mild fibrosis under each scar     AROM   Right Shoulder Flexion 169 Degrees   Right Shoulder ABduction 179 Degrees   Right Shoulder Internal Rotation 61 Degrees   Right Shoulder External Rotation 90 Degrees   Left Shoulder Flexion 155 Degrees   Left Shoulder ABduction 171 Degrees   Left Shoulder Internal Rotation 61 Degrees   Left Shoulder External Rotation 90 Degrees           LYMPHEDEMA/ONCOLOGY QUESTIONNAIRE - 02/03/16 1504      Type   Cancer Type left breast cancer     Surgeries   Lumpectomy Date 01/08/16   Sentinel Lymph Node Biopsy Date 01/08/16   Number Lymph Nodes Removed 3     Treatment   Active Chemotherapy Treatment No   Past Chemotherapy Treatment Yes   Date 12/19/15   Active Radiation Treatment No  pt to begin next week   Past Radiation Treatment No   Current Hormone Treatment No   Past Hormone Therapy No     What other symptoms do you have   Are you Having Heaviness or Tightness Yes   Are you having Pain Yes  mild incision pain   Are you having pitting edema No   Is it Hard or Difficult finding clothes that fit No   Do you have infections No   Is there Decreased scar mobility No  but there is mild fibrosis in areas of scars     Lymphedema Assessments   Lymphedema Assessments Upper extremities     Right Upper Extremity Lymphedema   15 cm Proximal to Olecranon Process 24 cm   Olecranon Process 21.8 cm   15 cm Proximal to Ulnar Styloid Process 20.5 cm   Just Proximal to Ulnar Styloid Process 13.6 cm   Across Hand at [employer] 16.5 cm   At [address] of  2nd Digit 5.5 cm     Left Upper Extremity Lymphedema   15 cm Proximal to Olecranon Process 25.7 cm   Olecranon Process 22.5 cm   15 cm Proximal to Ulnar Styloid Process 20.8 cm   Just Proximal to Ulnar Styloid Process 13.9 cm   Across Hand at [employer] 16.5 cm   At  Base of 2nd Digit 5.3 cm           Katina Dung - 02/03/16 0001    Open a tight or new jar No difficulty   Do heavy household chores (wash walls, wash floors) No difficulty   Carry a shopping bag or briefcase No difficulty   Wash your back Mild difficulty   Use a knife to cut food No difficulty   Recreational activities in which you take some force or impact through your arm, shoulder, or hand (golf, hammering, tennis) Mild difficulty   During the past week, to what extent has your arm, shoulder or hand problem interfered with your normal social activities with family, friends, neighbors, or groups? Not at all   During the past week, to what extent has your arm, shoulder or hand problem limited your work or other regular daily activities Slightly   Arm, shoulder, or hand pain. Mild   Tingling (pins and needles) in your arm, shoulder, or hand None   Difficulty Sleeping No difficulty   DASH Score 9.09 %                     PT Education - 02/03/16 1522    Education provided Yes   Education Details lymphedema risk reduction practices   Person(s) Educated Patient   Methods Explanation;Handout   Comprehension Verbalized understanding                [facility] Goals - 02/03/16 1531      CC Long Term Goal  #1   Title Patient will be able to independently verbalize lymphedema risk reduction practices   Time 4   Period Weeks   Status New     CC Long Term Goal  #2   Title Patient will be independent in a home exercise program for continued strengthening and stretching   Time 4   Period Weeks   Status New     CC Long Term Goal  #3   Title Patient will report an 80% improvement in  left axillary tightness to allow improved comfort   Time 4   Period Weeks   Status New            Plan - 02/03/16 1524    Clinical Impression Statement Patient presents to clinic for left axillary tightness and mild discomfort in LUE following a left lumpectomy and sentinel lymph node biopsy for treatment of left breast cancer. Patient has already undergone chemotherapy and is scheduled to begin radiation next week. She demonstrates great ROM of left shoulder but it is not quite as good as her right shoulder ROM and she has tighness in her axilla with this. Patient does demonstrate mild fibrosis under her lumpectomy and lymph node biopsy scars. While there is no clinically significant difference between circumferential measurements of her upper extremities her left UE 15 cm proximal to olecranon is 1.7 cm larger than the right side despite her being right handed. Will monitor this area to ensure there is no swelling. Patient would benefit from skilled PT services to educate pt in a home exercise program for ROM and strengthening and to educate pt on lymphedema risk reduction practices. Patient would also benefit from a compression sleeve when she travels by plane. This evaluation was of low complexity due to lack of comorbidities and personal factors.    Rehab Potential Good   Clinical Impairments Affecting Rehab Potential pt to begin radiation next week   PT Frequency  1x / week   PT Duration 4 weeks   PT Treatment/Interventions ADLs/Self Care Home Management;Therapeutic exercise;Patient/family education;Manual techniques;Scar mobilization;Passive range of motion   PT Next Visit Plan begin PROM of L shoulder, myofascial to left axilla and to areas of fibrosis under scars, supine scap series, give info on obtaining compression sleeve   Consulted and Agree with Plan of Care Patient      Patient will benefit from skilled therapeutic intervention in order to improve the following deficits and  impairments:  Decreased knowledge of precautions, Decreased strength, Decreased range of motion, Increased fascial restricitons  Visit Diagnosis: Stiffness of left shoulder, not elsewhere classified - Plan: PT plan of care cert/re-cert  Acute pain of left shoulder - Plan: PT plan of care cert/re-cert  Muscle weakness (generalized) - Plan: PT plan of care cert/re-cert     Problem List Patient Active Problem List   Diagnosis Date Noted  . Chemotherapy-induced peripheral neuropathy ([place]) 11/25/2015  . Antineoplastic chemotherapy induced anemia 08/27/2015  . Breast cancer of lower-inner quadrant of left female breast ([place]) 07/03/2015  . Left carotid artery stenosis   . Psoriasis   . Hormone replacement therapy   . Smoker   . Episcleritis     Allyson Sabal [facility] 02/03/2016, 3:34 PM  [place], [place], [address] Phone: [phone]   Fax:  [phone]  Name: MONIKE LINEN MRN: [id number] Date of Birth: 1955-02-05   Manus Gunning, PT 02/03/16 3:35 PM

## 2016-02-05 ENCOUNTER — Ambulatory Visit: Payer: BC Managed Care – PPO | Admitting: Physical Therapy

## 2016-02-05 ENCOUNTER — Ambulatory Visit (INDEPENDENT_AMBULATORY_CARE_PROVIDER_SITE_OTHER): Payer: BC Managed Care – PPO | Admitting: Physician Assistant

## 2016-02-05 ENCOUNTER — Encounter: Payer: Self-pay | Admitting: Physician Assistant

## 2016-02-05 ENCOUNTER — Ambulatory Visit: Payer: BC Managed Care – PPO | Attending: General Surgery | Admitting: Physical Therapy

## 2016-02-05 VITALS — BP 130/76 | HR 69 | Temp 97.9°F | Resp 16 | Wt 120.2 lb

## 2016-02-05 DIAGNOSIS — B029 Zoster without complications: Secondary | ICD-10-CM | POA: Diagnosis not present

## 2016-02-05 DIAGNOSIS — M25612 Stiffness of left shoulder, not elsewhere classified: Secondary | ICD-10-CM | POA: Diagnosis present

## 2016-02-05 DIAGNOSIS — Z510 Encounter for antineoplastic radiation therapy: Secondary | ICD-10-CM | POA: Diagnosis not present

## 2016-02-05 DIAGNOSIS — M25512 Pain in left shoulder: Secondary | ICD-10-CM | POA: Diagnosis present

## 2016-02-05 DIAGNOSIS — M6281 Muscle weakness (generalized): Secondary | ICD-10-CM | POA: Insufficient documentation

## 2016-02-05 MED ORDER — HYDROCODONE-ACETAMINOPHEN 5-325 MG PO TABS: 1.0000 | ORAL_TABLET | Freq: Four times a day (QID) | ORAL | 0 refills | Status: DC | PRN

## 2016-02-05 MED ORDER — GABAPENTIN 300 MG PO CAPS: ORAL_CAPSULE | ORAL | 3 refills | Status: DC

## 2016-02-05 MED ORDER — VALACYCLOVIR HCL 1 G PO TABS: 1000.0000 mg | ORAL_TABLET | Freq: Three times a day (TID) | ORAL | 0 refills | Status: DC

## 2016-02-05 NOTE — Therapy (Signed)
[place], [place], [address] Phone: [phone]   Fax:  [phone]  Physical Therapy Treatment  Patient Details  Name: Kim Ortega MRN: [id number] Date of Birth: October 09, 1955 Referring Provider: [name]  Encounter Date: 02/05/2016      PT End of Session - 02/05/16 1159    Visit Number 2   Number of Visits 5   Date for PT Re-Evaluation 03/02/16   PT Start Time 1105   PT Stop Time 1145   PT Time Calculation (min) 40 min   Activity Tolerance Patient tolerated treatment well   Behavior During Therapy [facility] for tasks assessed/performed      Past Medical History:  Diagnosis Date  . Allergy   . Anxiety    mild no regular meds  . Arthritis    psoriatic arthritis -back  . Breast cancer ([facility]) 06/25/15 bx   left breastinvasive ductal ca ,grade 1  . Episcleritis   . Hormone replacement therapy   . Hypertension   . Left carotid artery stenosis    40-59% (01/2015)  . Psoriasis   . Smoker     Past Surgical History:  Procedure Laterality Date  . ABDOMINAL HYSTERECTOMY    . BREAST LUMPECTOMY WITH RADIOACTIVE SEED AND SENTINEL LYMPH NODE BIOPSY Left 01/08/2016   Procedure: LEFT BREAST LUMPECTOMY WITH RADIOACTIVE SEED AND SENTINEL LYMPH NODE BIOPSY, BLUE DYE INJECTION;  Surgeon: Rolm Bookbinder, MD;  Location: [facility];  Service: General;  Laterality: Left;  . cervical acdf     C5-6- fusion with plates/screws "Dr. Sherwood Gambler" '12  . COLONOSCOPY WITH PROPOFOL N/A 04/16/2014   Procedure: COLONOSCOPY WITH PROPOFOL;  Surgeon: Garlan Fair, MD;  [facility];  Service: Endoscopy;  Laterality: N/A;  . COLPOSCOPY    . laporotomy     Ovarian cyst  . PORT-A-CATH REMOVAL Right 01/08/2016   Procedure: REMOVAL PORT-A-CATH;  Surgeon: Rolm Bookbinder, MD;  Location: [facility];  Service: General;  Laterality: Right;  . PORTACATH PLACEMENT N/A 07/24/2015   Procedure:  INSERTION PORT-A-CATH WITH [place];  Surgeon: Rolm Bookbinder, MD;  Location: [facility];  Service: General;  Laterality: N/A;  . TONSILLECTOMY     child    There were no vitals filed for this visit.      Subjective Assessment - 02/05/16 1150    Subjective Pt reports she developed a burning rash near left axilla tha tis moving down her side and some around left breast.  She is going to her primary physician this afternoon and hopes it is not shingles.  She reports she is doing her exercises and home and is her shoulder is doing well.    Pertinent History left breast cancer with lumpectomy and SLNB on 01/08/16, underwent chemotherapy, pt to start radiation next week   Patient Stated Goals to move my arm normally   Currently in Pain? Yes   Pain Score --  did not rate    Pain Orientation Left   Pain Descriptors / Indicators Burning   Pain Type Acute pain   Pain Onset In the past 7 days   Aggravating Factors  nothing    Pain Relieving Factors took some valtrex and that seemed to help             [facility] PT Assessment - 02/05/16 0001      Observation/Other Assessments   Observations pink rash under left axilla that extends toward back and apears to be moving down her  side.  She said it is not itchy and she has not changed detergents or anything that may have caused a contact dermatitis.                      [facility] Adult PT Treatment/Exercise - 02/05/16 0001      Self-Care   Self-Care Other Self-Care Comments   Other Self-Care Comments  brief discussion of lymphedema risk reduction and explained that pt will need a prophylactic sleeve for flying. Issued handout for where to get garments. Issued information for exercise programs at the cancer center and the [place] program at the [facility] for after treatment.  Encouraged pt to continue to exercise duing radiation to counteract fatigue      Shoulder Exercises: Supine   Horizontal ABduction Strengthening;Both  only 1-2 reps due to  pain from rash    Theraband Level (Shoulder Horizontal ABduction) Level 1 (Yellow)   External Rotation Strengthening;Both;5 reps   Theraband Level (Shoulder External Rotation) Level 1 (Yellow)   Flexion Strengthening;Both  1-2 reps, narrow and wide grip    Theraband Level (Shoulder Flexion) Level 1 (Yellow)   Other Supine Exercises instructed pt to progress exercise from supine to sitting to standing with core engaged and to progress to 10 reps then to red theraband      Shoulder Exercises: Standing   Other Standing Exercises dowel rod abduction and extension   Other Standing Exercises doorway stretch                 PT Education - 02/05/16 1159    Education provided Yes   Education Details how to get a compression garment, stretching and strengthening shoulder exercises and community exercise options , ABC class flyer    Person(s) Educated Patient   Methods Explanation;Handout   Comprehension Verbalized understanding                [facility] Goals - 02/05/16 1204      CC Long Term Goal  #1   Title Patient will be able to independently verbalize lymphedema risk reduction practices   Status On-going     CC Long Term Goal  #2   Title Patient will be independent in a home exercise program for continued strengthening and stretching   Status On-going     CC Long Term Goal  #3   Title Patient will report an 80% improvement in left axillary tightness to allow improved comfort   Status On-going            Plan - 02/05/16 1200    Clinical Impression Statement Pt is progressing well with left shoulder rehab, but is concerned about new onset of rash.  She is going to primary care physician today and will follow up with radiation.  She was instructed in stretching and strengthening exercise to do as she feels better and knows how to progresss them.  She knows about ABC class and how to get a prophylactic sleeve.  She will come back in 2 weeks to see how she is  progressing and will likely discharge from PT at that time.    Rehab Potential Good   Clinical Impairments Affecting Rehab Potential pt to begin radiation next week   PT Frequency 1x / week   PT Duration 4 weeks   PT Treatment/Interventions ADLs/Self Care Home Management;Therapeutic exercise;Patient/family education;Manual techniques;Scar mobilization;Passive range of motion   PT Next Visit Plan Reassess skin.  Perfrom ROM and manual techniques as needed  or discharge   Consulted and Agree with Plan of Care Patient      Patient will benefit from skilled therapeutic intervention in order to improve the following deficits and impairments:  Decreased knowledge of precautions, Decreased strength, Decreased range of motion, Increased fascial restricitons  Visit Diagnosis: Stiffness of left shoulder, not elsewhere classified  Acute pain of left shoulder  Muscle weakness (generalized)     Problem List Patient Active Problem List   Diagnosis Date Noted  . Chemotherapy-induced peripheral neuropathy ([place]) 11/25/2015  . Antineoplastic chemotherapy induced anemia 08/27/2015  . Breast cancer of lower-inner quadrant of left female breast ([place]) 07/03/2015  . Left carotid artery stenosis   . Psoriasis   . Hormone replacement therapy   . Smoker   . Episcleritis    Donato Heinz. Owens Shark PT  Norwood Levo 02/05/2016, 12:05 PM  [place], [place], [address] Phone: [phone]   Fax:  [phone]  Name: DEIA MATCHETT MRN: [id number] Date of Birth: February 05, 1955

## 2016-02-05 NOTE — Patient Instructions (Signed)
First of all, check with your insurance company to see if provider is in [place]                                            [address]  [place], [place] [address] [phone]    Does not file for insurance--- call for appointment with [facility]   (for wigs and compression sleeves and gloves)  [place], [place] [address] [phone]  Will file some insurances --- call for appointment   Second to [facility] (for mastectomy prosthetics and garments) [place],  [address] [phone] Will file some insurances --- call for appointment  [facility]  [address]  [place],  [address] [phone] Lower extremity garments  [place], owner [phone])   [phone] phone)    [phone] (fax) Will do home visit to measure for custom day and nighttime garments, can get pumps Will file insurance  [place] Sales rep:  Kern [id number]  [phone] [email or web address] Biocompression pumps   [employer]  Sales rep: Donneta Romberg:  [phone] [email or web address] Lyndal Pulley and Flexitouch pumps    Other Resources: National Lymphedema Network:  www.lymphnet.org www.Klosetraining.com for patient articles and purchase a self manual lymph drainage DVD www.[email or web address] has informative articles.    Flexion (Eccentric) - Active-Assist (Cane)          Cancer Rehab [phone]    Use unaffected arm to push affected arm forward. Avoid hiking shoulder (shoulder should NOT touch cheek). Keep palm relaxed. Slowly lower affected arm. Hold stretch for _5_ seconds repeating _5-10_ times, _1-2_ times a day.  Abduction (Eccentric) - Active-Assist (Cane)    Use unaffected arm to push affected arm out to side. Avoid hiking shoulder (shoulder should NOT touch cheek). Keep palm relaxed. Slowly lower affected arm. Hold stretch _5_  seconds repeating _5-10_ times, _1-2_ times a day.  Cane Exercise: Extension   Stand holding cane behind back with both hands palm-up. Lift the cane away from body until gentle stretch felt. Do NOT lean forward.  Hold __5__ seconds. Repeat _5-10___ times. Do _1-2___ sessions per day.  CHEST: Doorway, Bilateral - Standing    Standing in doorway, place hands on wall with elbows bent at shoulder height and place one foot in front of other. Shift weight onto front foot. Hold _10-20__ seconds. Do _3-5_ times, _1-2_ times a day.    Over Head Pull: Narrow and Wide Grip   Cancer Rehab [phone]   On back, knees bent, feet flat, band across thighs, elbows straight but relaxed. Pull hands apart (start). Keeping elbows straight, bring arms up and over head, hands toward floor. Keep pull steady on band. Hold momentarily. Return slowly, keeping pull steady, back to start. Then do same with a wider grip on the band (past shoulder width) Repeat _5-10__ times. Band color __yellow____   Side Pull: Double Arm   On back, knees bent, feet flat. Arms perpendicular to body, shoulder level, elbows straight but relaxed. Pull arms out to sides, elbows straight. Resistance band comes across collarbones, hands toward floor. Hold momentarily. Slowly return to starting position. Repeat _5-10__ times. Band color _yellow____   Sword   On back, knees bent, feet flat, left hand on left hip, right hand above left. Pull right arm DIAGONALLY (  hip to shoulder) across chest. Bring right arm along head toward floor. Hold momentarily. Slowly return to starting position. Repeat _5-10__ times. Do with left arm. Band color _yellow_____   Shoulder Rotation: Double Arm   On back, knees bent, feet flat, elbows tucked at sides, bent 90, hands palms up. Pull hands apart and down toward floor, keeping elbows near sides. Hold momentarily. Slowly return to starting position. Repeat _5-0__ times. Band color __yellow____

## 2016-02-05 NOTE — Progress Notes (Signed)
Patient ID: Kim Ortega MRN: [id number], DOB: February 02, 1955, 61 y.o. Date of Encounter: 02/05/2016, 2:22 PM    Chief Complaint:  Chief Complaint  Patient presents with  . rash under left arm     HPI: 61 y.o. year old female presents with above.   Reviewed that she has recently been diagnosed with left breast cancer. She says that she has completed chemotherapy and had surgery. Says that she is actually considered "cancer free right now ". However does plan to go through radiation treatments just to be extra cautious.  Says that she developed burning pain under her left axilla about 1 week ago. Says that she just noticed that there is a rash there the day before yesterday. Says that she actually did not realize the rash was there until she went to physical therapy.  States that it is burning pain there. Says that the site is uncomfortable and is preventing her from getting good sleep. Says that she actually has been sleeping with a sock tucked under her armpit so that her arm is not rubbing on that area.     Home Meds:   Outpatient Medications Prior to Visit  Medication Sig Dispense Refill  . ALPRAZolam (XANAX) 0.5 MG tablet Take 0.5 mg by mouth 3 (three) times daily as needed for anxiety.    [name] anastrozole (ARIMIDEX) 1 MG tablet Take 1 tablet (1 mg total) by mouth daily. 30 tablet 0  . aspirin EC 81 MG tablet Take 81 mg by mouth daily.    . bisoprolol-hydrochlorothiazide (ZIAC) 10-6.25 MG tablet TAKE 1 TABLET BY MOUTH DAILY. 90 tablet 3  . Calcium Carbonate-Vitamin D (CALCIUM-D PO) Take 1 tablet by mouth 2 (two) times daily.     . cetirizine (ZYRTEC) 10 MG tablet Take 10 mg by mouth daily.    . clobetasol (OLUX) 0.05 % topical foam Apply 1 application topically daily as needed (psoriasis).   2  . desonide (DESOWEN) 0.05 % cream Apply 1 application topically daily. For psoriasis    . diclofenac sodium (VOLTAREN) 1 % GEL Apply 2 g topically 3 (three) times daily as needed (Pain).      . fluticasone (FLONASE) 50 MCG/ACT nasal spray INHALE 2 SPRAYS IN EACH NOSTRIL DAILY 16 g 11  . ibuprofen (ADVIL,MOTRIN) 200 MG tablet Take 400 mg by mouth every 6 (six) hours as needed for headache (pain).    . Ketotifen Fumarate (ALLERGY EYE DROPS OP) Place 1 drop into both eyes 2 (two) times daily as needed (allergies).     . losartan (COZAAR) 50 MG tablet TAKE 1 TABLET BY MOUTH ONCE DAILY. 90 tablet 3  . Multiple Vitamin (MULTIVITAMIN WITH MINERALS) TABS tablet Take 1 tablet by mouth daily.    . ondansetron (ZOFRAN) 8 MG tablet Take 1 tablet (8 mg) twice daily as needed for nausea/vomiting 60 tablet 2  . tiZANidine (ZANAFLEX) 4 MG tablet Take 4 mg by mouth at bedtime as needed (back pain).   2   No facility-administered medications prior to visit.     Allergies: No Known Allergies    Review of Systems: See HPI for pertinent ROS. All other ROS negative.    Physical Exam: Blood pressure 130/76, pulse 69, temperature 97.9 F (36.6 C), temperature source Oral, resp. rate 16, weight 120 lb 3.2 oz (54.5 kg), SpO2 99 %., Body mass index is 20.63 kg/m. General: WNWD WF.  Appears in no acute distress. Neck: Supple. No thyromegaly. No lymphadenopathy. Lungs: Clear bilaterally to  auscultation without wheezes, rales, or rhonchi. Breathing is unlabored. Heart: Regular rhythm. No murmurs, rubs, or gallops. Msk:  Strength and tone normal for age. Skin: Just inferior to left axilla---there is pink erythematous splotchy rash that extends anteriorly. Lumpectomy surgery site looks good. No erythema or drainage, no sign of infection.  Neuro: Alert and oriented X 3. Moves all extremities spontaneously. Gait is normal. CNII-XII grossly in tact. Psych:  Responds to questions appropriately with a normal affect.     ASSESSMENT AND PLAN:  61 y.o. year old female with  1. Herpes zoster without complication She is to start the Valtrex immediately take as directed and complete all of it. She is to take  the gabapentin as directed. Discussed with her that different people have different severities and durations of pain. Once she gets up to taking 1 tablet 3 times daily--if the pain is not controlled then call and we can tell her how to further titrate. Will plan to titrate until pain is controlled. Then would take it that dose for 2 weeks and then can try decreasing and tapering off and see whether pain has resolved. She says that she is having significant amount of discomfort currently and it is interrupting her sleep so I will give her some hydrocodone to use for breakthrough pain in the interim. - valACYclovir (VALTREX) 1000 MG tablet; Take 1 tablet (1,000 mg total) by mouth 3 (three) times daily.  Dispense: 21 tablet; Refill: 0 - gabapentin (NEURONTIN) 300 MG capsule; Day 1: Take one.  Day 2: Take 1 twice a day. Day 3: Take one 3 times a day. Call if need to further titrate dose.  Dispense: 90 capsule; Refill: 3 - HYDROcodone-acetaminophen (NORCO/VICODIN) 5-325 MG tablet; Take 1 tablet by mouth every 6 (six) hours as needed.  Dispense: 30 tablet; Refill: 0   Signed, [address] [place], [place], [facility] 02/05/2016 2:22 PM

## 2016-02-09 ENCOUNTER — Ambulatory Visit
Admission: RE | Admit: 2016-02-09 | Discharge: 2016-02-09 | Disposition: A | Discharge status: Home / Self Care | Payer: BC Managed Care – PPO | Source: Ambulatory Visit | Attending: Radiation Oncology | Admitting: Radiation Oncology

## 2016-02-09 DIAGNOSIS — Z510 Encounter for antineoplastic radiation therapy: Secondary | ICD-10-CM | POA: Diagnosis not present

## 2016-02-10 ENCOUNTER — Ambulatory Visit
Admission: RE | Admit: 2016-02-10 | Discharge: 2016-02-10 | Disposition: A | Discharge status: Home / Self Care | Payer: BC Managed Care – PPO | Source: Ambulatory Visit | Attending: Radiation Oncology | Admitting: Radiation Oncology

## 2016-02-10 DIAGNOSIS — C50312 Malignant neoplasm of lower-inner quadrant of left female breast: Secondary | ICD-10-CM | POA: Insufficient documentation

## 2016-02-10 DIAGNOSIS — Z510 Encounter for antineoplastic radiation therapy: Secondary | ICD-10-CM | POA: Diagnosis not present

## 2016-02-10 MED ORDER — ALRA NON-METALLIC DEODORANT (RAD-ONC)
1.0000 "application " | Freq: Once | TOPICAL | Status: AC
  Administered 2016-02-10: 1 via TOPICAL

## 2016-02-10 MED ORDER — RADIAPLEXRX EX GEL
Freq: Once | CUTANEOUS | Status: AC
  Administered 2016-02-10: 13:00:00 via TOPICAL

## 2016-02-10 NOTE — Progress Notes (Signed)
Pt education done, radiation therapy and you book, my business card, alra and radiaplex given, discussed side effects, skin irritation, swelling breast,tenderness, fatigue, luke warm showers, dove soap unscented, electric razor only if shaving, no under wire bras, increase protein in diet, stay hydrated, no rubbing,scrubbing or scratching breast, apply radiaplex bid after rad txc and bedtime daily, alra after rad tx and prn, teach back given 1:40 PM'    Electronically signed by Doreen Beam

## 2016-02-11 ENCOUNTER — Ambulatory Visit: Payer: BC Managed Care – PPO | Admitting: Physical Therapy

## 2016-02-11 ENCOUNTER — Ambulatory Visit
Admission: RE | Admit: 2016-02-11 | Discharge: 2016-02-11 | Disposition: A | Discharge status: Home / Self Care | Payer: BC Managed Care – PPO | Source: Ambulatory Visit | Attending: Radiation Oncology | Admitting: Radiation Oncology

## 2016-02-11 DIAGNOSIS — Z510 Encounter for antineoplastic radiation therapy: Secondary | ICD-10-CM | POA: Diagnosis not present

## 2016-02-12 ENCOUNTER — Ambulatory Visit
Admission: RE | Admit: 2016-02-12 | Discharge: 2016-02-12 | Disposition: A | Discharge status: Home / Self Care | Payer: BC Managed Care – PPO | Source: Ambulatory Visit | Attending: Radiation Oncology | Admitting: Radiation Oncology

## 2016-02-12 DIAGNOSIS — Z510 Encounter for antineoplastic radiation therapy: Secondary | ICD-10-CM | POA: Diagnosis not present

## 2016-02-13 ENCOUNTER — Ambulatory Visit
Admission: RE | Admit: 2016-02-13 | Discharge: 2016-02-13 | Disposition: A | Discharge status: Home / Self Care | Payer: BC Managed Care – PPO | Source: Ambulatory Visit | Attending: Radiation Oncology | Admitting: Radiation Oncology

## 2016-02-13 ENCOUNTER — Encounter: Payer: Self-pay | Admitting: Radiation Oncology

## 2016-02-13 VITALS — BP 121/60 | HR 78 | Temp 97.7°F | Resp 20 | Wt 122.2 lb

## 2016-02-13 DIAGNOSIS — Z510 Encounter for antineoplastic radiation therapy: Secondary | ICD-10-CM | POA: Diagnosis not present

## 2016-02-13 DIAGNOSIS — Z170 Estrogen receptor positive status [ER+]: Principal | ICD-10-CM

## 2016-02-13 DIAGNOSIS — C50312 Malignant neoplasm of lower-inner quadrant of left female breast: Secondary | ICD-10-CM

## 2016-02-13 NOTE — Progress Notes (Signed)
Department of Radiation Oncology  Phone:  [phone] Fax:        [phone]  Weekly Treatment Note    Name: Kim Ortega Date: 02/13/2016 MRN: [id number] DOB: 08/08/1955   Diagnosis:     ICD-9-CM ICD-10-CM   1. Malignant neoplasm of lower-inner quadrant of left breast in female, estrogen receptor positive ([place]) 174.3 C50.312    V86.0 Z17.0      Current dose: 7.2 Gy  Current fraction: 4   MEDICATIONS: Current Outpatient Prescriptions  Medication Sig Dispense Refill  . ALPRAZolam (XANAX) 0.5 MG tablet Take 0.5 mg by mouth 3 (three) times daily as needed for anxiety.    [name] anastrozole (ARIMIDEX) 1 MG tablet Take 1 tablet (1 mg total) by mouth daily. 30 tablet 0  . aspirin EC 81 MG tablet Take 81 mg by mouth daily.    . bisoprolol-hydrochlorothiazide (ZIAC) 10-6.25 MG tablet TAKE 1 TABLET BY MOUTH DAILY. 90 tablet 3  . Calcium Carbonate-Vitamin D (CALCIUM-D PO) Take 1 tablet by mouth 2 (two) times daily.     . cetirizine (ZYRTEC) 10 MG tablet Take 10 mg by mouth daily.    . clobetasol (OLUX) 0.05 % topical foam Apply 1 application topically daily as needed (psoriasis).   2  . desonide (DESOWEN) 0.05 % cream Apply 1 application topically daily. For psoriasis    . diclofenac sodium (VOLTAREN) 1 % GEL Apply 2 g topically 3 (three) times daily as needed (Pain).     . fluticasone (FLONASE) 50 MCG/ACT nasal spray INHALE 2 SPRAYS IN EACH NOSTRIL DAILY 16 g 11  . gabapentin (NEURONTIN) 300 MG capsule Day 1: Take one.  Day 2: Take 1 twice a day. Day 3: Take one 3 times a day. Call if need to further titrate dose. 90 capsule 3  . hyaluronate sodium (RADIAPLEXRX) GEL Apply 1 application topically 2 (two) times daily.    [name] HYDROcodone-acetaminophen (NORCO/VICODIN) 5-325 MG tablet Take 1 tablet by mouth every 6 (six) hours as needed. 30 tablet 0  . ibuprofen (ADVIL,MOTRIN) 200 MG tablet Take 400 mg by mouth every 6 (six) hours as needed for headache (pain).    . Ketotifen Fumarate  (ALLERGY EYE DROPS OP) Place 1 drop into both eyes 2 (two) times daily as needed (allergies).     . losartan (COZAAR) 50 MG tablet TAKE 1 TABLET BY MOUTH ONCE DAILY. 90 tablet 3  . Multiple Vitamin (MULTIVITAMIN WITH MINERALS) TABS tablet Take 1 tablet by mouth daily.    . non-metallic deodorant Jethro Poling) MISC Apply 1 application topically daily as needed.    . ondansetron (ZOFRAN) 8 MG tablet Take 1 tablet (8 mg) twice daily as needed for nausea/vomiting 60 tablet 2  . tiZANidine (ZANAFLEX) 4 MG tablet Take 4 mg by mouth at bedtime as needed (back pain).   2  . valACYclovir (VALTREX) 1000 MG tablet Take 1 tablet (1,000 mg total) by mouth 3 (three) times daily. 21 tablet 0   No current facility-administered medications for this encounter.      ALLERGIES: Patient has no known allergies.   LABORATORY DATA:  Lab Results  Component Value Date   WBC 5.3 12/16/2015   HGB 12.2 12/16/2015   HCT 36.2 12/16/2015   MCV 110.6 (H) 12/16/2015   PLT 245 12/16/2015   Lab Results  Component Value Date   NA 139 12/16/2015   K 4.2 12/16/2015   CL 104 07/21/2015   CO2 26 12/16/2015   Lab Results  Component Value  Date   ALT 20 12/16/2015   AST 22 12/16/2015   ALKPHOS 54 12/16/2015   BILITOT 0.40 12/16/2015     NARRATIVE: Kim Ortega was seen today for weekly treatment management. The chart was checked and the patient's films were reviewed.  The patient is doing well in her first week of treatment. She has been treated for shingles. No major changes related to the treatment currently.  PHYSICAL EXAMINATION: weight is 122 lb 3.2 oz (55.4 kg). Her oral temperature is 97.7 F (36.5 C). Her blood pressure is 121/60 and her pulse is 78. Her respiration is 20.      Very mild erythema centrally in the breast region on the left.  ASSESSMENT: The patient is doing satisfactorily with treatment.  PLAN: We will continue with the patient's radiation treatment as planned.

## 2016-02-13 NOTE — Progress Notes (Signed)
Weekly rad tx breast, left, 4/33 completed, mild erythema side of breast,slight burning at times, she does have shingles, no breakage, taking valtrex ,completes tomorrow, using radiaplex bid, appetite good BP 121/60 (BP Location: Right Arm, Patient Position: Sitting, Cuff Size: Normal)   Pulse 78   Temp 97.7 F (36.5 C) (Oral)   Resp 20   Wt 122 lb 3.2 oz (55.4 kg)   BMI 20.98 kg/m   Wt Readings from Last 3 Encounters:  02/13/16 122 lb 3.2 oz (55.4 kg)  02/05/16 120 lb 3.2 oz (54.5 kg)  01/26/16 120 lb 3.2 oz (54.5 kg)

## 2016-02-13 NOTE — Progress Notes (Signed)
  Radiation Oncology         [phone] ________________________________  Name: Kim Ortega MRN: [id number]  Date: 02/13/2016  DOB: 1955-12-19  Complex simulation note  The patient has undergone complex simulation for her upcoming boost treatment for her diagnosis of left-sided breast cancer. The patient has initially been planned to receive 50.4 Gy. The patient will now receive a 10 Gy boost to the seroma cavity which has been contoured. This will be accomplished using an en face electron field. Based on the depth of the target area, 12 MeV electrons will be used. The patient's final total dose therefore will be 60.4 Gy. A complex isodose plan from the electron [facility] calculation is requested for the boost treatment.   _______________________________  Jodelle Gross, MD, PhD

## 2016-02-16 ENCOUNTER — Ambulatory Visit
Admission: RE | Admit: 2016-02-16 | Discharge: 2016-02-16 | Disposition: A | Discharge status: Home / Self Care | Payer: BC Managed Care – PPO | Source: Ambulatory Visit | Attending: Radiation Oncology | Admitting: Radiation Oncology

## 2016-02-16 DIAGNOSIS — Z510 Encounter for antineoplastic radiation therapy: Secondary | ICD-10-CM | POA: Diagnosis not present

## 2016-02-17 ENCOUNTER — Ambulatory Visit
Admission: RE | Admit: 2016-02-17 | Discharge: 2016-02-17 | Disposition: A | Discharge status: Home / Self Care | Payer: BC Managed Care – PPO | Source: Ambulatory Visit | Attending: Radiation Oncology | Admitting: Radiation Oncology

## 2016-02-17 DIAGNOSIS — Z510 Encounter for antineoplastic radiation therapy: Secondary | ICD-10-CM | POA: Diagnosis not present

## 2016-02-18 ENCOUNTER — Ambulatory Visit
Admission: RE | Admit: 2016-02-18 | Discharge: 2016-02-18 | Disposition: A | Discharge status: Home / Self Care | Payer: BC Managed Care – PPO | Source: Ambulatory Visit | Attending: Radiation Oncology | Admitting: Radiation Oncology

## 2016-02-18 DIAGNOSIS — Z510 Encounter for antineoplastic radiation therapy: Secondary | ICD-10-CM | POA: Diagnosis not present

## 2016-02-19 ENCOUNTER — Ambulatory Visit
Admission: RE | Admit: 2016-02-19 | Discharge: 2016-02-19 | Disposition: A | Discharge status: Home / Self Care | Payer: BC Managed Care – PPO | Source: Ambulatory Visit | Attending: Radiation Oncology | Admitting: Radiation Oncology

## 2016-02-19 DIAGNOSIS — Z510 Encounter for antineoplastic radiation therapy: Secondary | ICD-10-CM | POA: Diagnosis not present

## 2016-02-20 ENCOUNTER — Ambulatory Visit
Admission: RE | Admit: 2016-02-20 | Discharge: 2016-02-20 | Disposition: A | Discharge status: Home / Self Care | Payer: BC Managed Care – PPO | Source: Ambulatory Visit | Attending: Radiation Oncology | Admitting: Radiation Oncology

## 2016-02-20 ENCOUNTER — Encounter: Payer: Self-pay | Admitting: Radiation Oncology

## 2016-02-20 VITALS — BP 130/73 | HR 68 | Temp 98.4°F | Resp 16 | Wt 123.8 lb

## 2016-02-20 DIAGNOSIS — C50312 Malignant neoplasm of lower-inner quadrant of left female breast: Secondary | ICD-10-CM

## 2016-02-20 DIAGNOSIS — Z170 Estrogen receptor positive status [ER+]: Principal | ICD-10-CM

## 2016-02-20 DIAGNOSIS — Z510 Encounter for antineoplastic radiation therapy: Secondary | ICD-10-CM | POA: Diagnosis not present

## 2016-02-20 NOTE — Progress Notes (Signed)
Weekly rad txs left breast 9/33 completed, mild erythema near bottom of breast,skin intact, no pain, uses radiaplex bid, appetite good,no fatigue 3:06 PM BP 130/73 (BP Location: Right Arm, Patient Position: Sitting, Cuff Size: Normal)   Pulse 68   Temp 98.4 F (36.9 C) (Oral)   Resp 16   Wt 123 lb 12.8 oz (56.2 kg)   BMI 21.25 kg/m   Wt Readings from Last 3 Encounters:  02/20/16 123 lb 12.8 oz (56.2 kg)  02/13/16 122 lb 3.2 oz (55.4 kg)  02/05/16 120 lb 3.2 oz (54.5 kg)

## 2016-02-21 NOTE — Progress Notes (Signed)
Department of Radiation Oncology  Phone:  [phone] Fax:        [phone]  Weekly Treatment Note    Name: Kim Ortega Date: 02/21/2016 MRN: [id number] DOB: 02-Jan-1956   Diagnosis:     ICD-9-CM ICD-10-CM   1. Malignant neoplasm of lower-inner quadrant of left breast in female, estrogen receptor positive ([place]) 174.3 C50.312    V86.0 Z17.0      Current dose: 16.2 Gy  Current fraction: 9   MEDICATIONS: Current Outpatient Prescriptions  Medication Sig Dispense Refill  . ALPRAZolam (XANAX) 0.5 MG tablet Take 0.5 mg by mouth 3 (three) times daily as needed for anxiety.    [name] anastrozole (ARIMIDEX) 1 MG tablet Take 1 tablet (1 mg total) by mouth daily. (Patient not taking: Reported on 02/20/2016) 30 tablet 0  . aspirin EC 81 MG tablet Take 81 mg by mouth daily.    . bisoprolol-hydrochlorothiazide (ZIAC) 10-6.25 MG tablet TAKE 1 TABLET BY MOUTH DAILY. 90 tablet 3  . Calcium Carbonate-Vitamin D (CALCIUM-D PO) Take 1 tablet by mouth 2 (two) times daily.     . cetirizine (ZYRTEC) 10 MG tablet Take 10 mg by mouth daily.    . clobetasol (OLUX) 0.05 % topical foam Apply 1 application topically daily as needed (psoriasis).   2  . desonide (DESOWEN) 0.05 % cream Apply 1 application topically daily. For psoriasis    . diclofenac sodium (VOLTAREN) 1 % GEL Apply 2 g topically 3 (three) times daily as needed (Pain).     . fluticasone (FLONASE) 50 MCG/ACT nasal spray INHALE 2 SPRAYS IN EACH NOSTRIL DAILY 16 g 11  . hyaluronate sodium (RADIAPLEXRX) GEL Apply 1 application topically 2 (two) times daily.    [name] ibuprofen (ADVIL,MOTRIN) 200 MG tablet Take 400 mg by mouth every 6 (six) hours as needed for headache (pain).    . Ketotifen Fumarate (ALLERGY EYE DROPS OP) Place 1 drop into both eyes 2 (two) times daily as needed (allergies).     . losartan (COZAAR) 50 MG tablet TAKE 1 TABLET BY MOUTH ONCE DAILY. 90 tablet 3  . Multiple Vitamin (MULTIVITAMIN WITH MINERALS) TABS tablet Take 1 tablet  by mouth daily.    . non-metallic deodorant Jethro Poling) MISC Apply 1 application topically daily as needed.    . ondansetron (ZOFRAN) 8 MG tablet Take 1 tablet (8 mg) twice daily as needed for nausea/vomiting 60 tablet 2  . tiZANidine (ZANAFLEX) 4 MG tablet Take 4 mg by mouth at bedtime as needed (back pain).   2   No current facility-administered medications for this encounter.      ALLERGIES: Patient has no known allergies.   LABORATORY DATA:  Lab Results  Component Value Date   WBC 5.3 12/16/2015   HGB 12.2 12/16/2015   HCT 36.2 12/16/2015   MCV 110.6 (H) 12/16/2015   PLT 245 12/16/2015   Lab Results  Component Value Date   NA 139 12/16/2015   K 4.2 12/16/2015   CL 104 07/21/2015   CO2 26 12/16/2015   Lab Results  Component Value Date   ALT 20 12/16/2015   AST 22 12/16/2015   ALKPHOS 54 12/16/2015   BILITOT 0.40 12/16/2015     NARRATIVE: Kim Ortega was seen today for weekly treatment management. The chart was checked and the patient's films were reviewed. The patient states that she is doing well and her second week of treatment. No major issues with good energy level  Weekly rad txs left breast 9/33  completed, mild erythema near bottom of breast,skin intact, no pain, uses radiaplex bid, appetite good,no fatigue 10:49 AM BP 130/73 (BP Location: Right Arm, Patient Position: Sitting, Cuff Size: Normal)   Pulse 68   Temp 98.4 F (36.9 C) (Oral)   Resp 16   Wt 123 lb 12.8 oz (56.2 kg)   BMI 21.25 kg/m   Wt Readings from Last 3 Encounters:  02/20/16 123 lb 12.8 oz (56.2 kg)  02/13/16 122 lb 3.2 oz (55.4 kg)  02/05/16 120 lb 3.2 oz (54.5 kg)    PHYSICAL EXAMINATION: weight is 123 lb 12.8 oz (56.2 kg). Her oral temperature is 98.4 F (36.9 C). Her blood pressure is 130/73 and her pulse is 68. Her respiration is 16.        ASSESSMENT: The patient is doing satisfactorily with treatment.  PLAN: We will continue with the patient's radiation treatment as planned.

## 2016-02-23 ENCOUNTER — Ambulatory Visit
Admission: RE | Admit: 2016-02-23 | Discharge: 2016-02-23 | Disposition: A | Discharge status: Home / Self Care | Payer: BC Managed Care – PPO | Source: Ambulatory Visit | Attending: Radiation Oncology | Admitting: Radiation Oncology

## 2016-02-23 DIAGNOSIS — Z510 Encounter for antineoplastic radiation therapy: Secondary | ICD-10-CM | POA: Diagnosis not present

## 2016-02-24 ENCOUNTER — Ambulatory Visit: Payer: BC Managed Care – PPO | Admitting: Physical Therapy

## 2016-02-24 ENCOUNTER — Ambulatory Visit
Admission: RE | Admit: 2016-02-24 | Discharge: 2016-02-24 | Disposition: A | Discharge status: Home / Self Care | Payer: BC Managed Care – PPO | Source: Ambulatory Visit | Attending: Radiation Oncology | Admitting: Radiation Oncology

## 2016-02-24 DIAGNOSIS — M25612 Stiffness of left shoulder, not elsewhere classified: Secondary | ICD-10-CM

## 2016-02-24 DIAGNOSIS — M6281 Muscle weakness (generalized): Secondary | ICD-10-CM

## 2016-02-24 DIAGNOSIS — Z510 Encounter for antineoplastic radiation therapy: Secondary | ICD-10-CM | POA: Diagnosis not present

## 2016-02-24 DIAGNOSIS — M25512 Pain in left shoulder: Secondary | ICD-10-CM

## 2016-02-24 NOTE — Patient Instructions (Signed)
Strengthening: Resisted Flexion    Cancer Rehab [phone]    Hold tubing with left arm at side. Pull forward and up. Move shoulder through pain-free range of motion. Repeat _5-10___ times per set. Do _1-2___ sessions per day.  Strengthening: Resisted Internal Rotation    Hold tubing in left hand, elbow at side and forearm out. Rotate forearm in across body. Repeat _5-10___ times per set. Do _1-2___ sessions per day.  Strengthening: Resisted Extension    Hold tubing in left hand, arm forward. Pull arm back, elbow straight. Repeat __5-10__ times per set. Do __1-2__ sessions per day.   Strengthening: Resisted External Rotation    Hold tubing in left hand, elbow at side and forearm across body. Rotate forearm out. Repeat _5-10___ times per set. Do __1-2__ sessions per day.    

## 2016-02-24 NOTE — Therapy (Signed)
[place], [place], [address] Phone: [phone]   Fax:  [phone]  Physical Therapy Treatment  Patient Details  Name: Kim Ortega MRN: [id number] Date of Birth: September 24, 1955 Referring Provider: [name]  Encounter Date: 02/24/2016      PT End of Session - 02/24/16 1641    Visit Number 3   Number of Visits 5   Date for PT Re-Evaluation 03/02/16   PT Start Time 6283  did not need full session    PT Stop Time 1420   PT Time Calculation (min) 25 min   Activity Tolerance Patient tolerated treatment well   Behavior During Therapy [facility] for tasks assessed/performed      Past Medical History:  Diagnosis Date  . Allergy   . Anxiety    mild no regular meds  . Arthritis    psoriatic arthritis -back  . Breast cancer ([place]) 06/25/15 bx   left breastinvasive ductal ca ,grade 1  . Episcleritis   . Hormone replacement therapy   . Hypertension   . Left carotid artery stenosis    40-59% (01/2015)  . Psoriasis   . Smoker     Past Surgical History:  Procedure Laterality Date  . ABDOMINAL HYSTERECTOMY    . BREAST LUMPECTOMY WITH RADIOACTIVE SEED AND SENTINEL LYMPH NODE BIOPSY Left 01/08/2016   Procedure: LEFT BREAST LUMPECTOMY WITH RADIOACTIVE SEED AND SENTINEL LYMPH NODE BIOPSY, BLUE DYE INJECTION;  Surgeon: Rolm Bookbinder, MD;  Location: [facility];  Service: General;  Laterality: Left;  . cervical acdf     C5-6- fusion with plates/screws "Dr. Sherwood Gambler" '12  . COLONOSCOPY WITH PROPOFOL N/A 04/16/2014   Procedure: COLONOSCOPY WITH PROPOFOL;  Surgeon: Garlan Fair, MD;  Location: [facility];  Service: Endoscopy;  Laterality: N/A;  . COLPOSCOPY    . laporotomy     Ovarian cyst  . PORT-A-CATH REMOVAL Right 01/08/2016   Procedure: REMOVAL PORT-A-CATH;  Surgeon: Rolm Bookbinder, MD;  Location: [facility];  Service: General;  Laterality: Right;  . PORTACATH PLACEMENT N/A  07/24/2015   Procedure: INSERTION PORT-A-CATH WITH [place];  Surgeon: Rolm Bookbinder, MD;  Location: [facility];  Service: General;  Laterality: N/A;  . TONSILLECTOMY     child    There were no vitals filed for this visit.      Subjective Assessment - 02/24/16 1401    Subjective I can do almost anything i want to .  Pt got a compression sleeve for flying. she did not get a gauntlet    Pertinent History left breast cancer with lumpectomy and SLNB on 01/08/16, underwent chemotherapy, pt to start radiation next week   Patient Stated Goals to move my arm normally   Currently in Pain? No/denies  sharp pain evey now and then             [facility] PT Assessment - 02/24/16 0001      Observation/Other Assessments   Quick DASH  4.55              Quick Dash - 02/24/16 0001    Open a tight or new jar No difficulty   Do heavy household chores (wash walls, wash floors) No difficulty   Carry a shopping bag or briefcase No difficulty   Wash your back No difficulty   Use a knife to cut food No difficulty   Recreational activities in which you take some force or impact through your arm, shoulder, or hand (golf,  hammering, tennis) No difficulty   During the past week, to what extent has your arm, shoulder or hand problem interfered with your normal social activities with family, friends, neighbors, or groups? Not at all   During the past week, to what extent has your arm, shoulder or hand problem limited your work or other regular daily activities Not at all   Arm, shoulder, or hand pain. Mild   Tingling (pins and needles) in your arm, shoulder, or hand Mild   Difficulty Sleeping No difficulty   DASH Score 4.55 %               [facility] Adult PT Treatment/Exercise - 02/24/16 0001      Self-Care   Self-Care Other Self-Care Comments   Other Self-Care Comments  reviewed lymphedema risk reduction practices and issued handouts from class.      Shoulder Exercises: Standing   External Rotation  Strengthening;Left;5 reps;Theraband   Theraband Level (Shoulder External Rotation) Level 2 (Red)   Internal Rotation Strengthening;Left;5 reps;Theraband   Theraband Level (Shoulder Internal Rotation) Level 2 (Red)   Flexion Strengthening;Left;5 reps;Theraband   Theraband Level (Shoulder Flexion) Level 2 (Red)   Extension Strengthening;Left;5 reps;Theraband   Theraband Level (Shoulder Extension) Level 2 (Red)   Row Strengthening;Both;5 reps;Theraband   Theraband Level (Shoulder Row) Level 3 Nyoka Cowden)                PT Education - 02/24/16 1641    Education provided Yes   Education Details rockwood exercises and standing rows    Person(s) Educated Patient   Methods Explanation;Demonstration;Handout   Comprehension Verbalized understanding;Returned demonstration                [facility] Goals - 02/24/16 1403      CC Long Term Goal  #1   Title Patient will be able to independently verbalize lymphedema risk reduction practices   Status Achieved     CC Long Term Goal  #2   Title Patient will be independent in a home exercise program for continued strengthening and stretching   Status Achieved     CC Long Term Goal  #3   Title Patient will report an 80% improvement in left axillary tightness to allow improved comfort   Status Achieved            Plan - 02/24/16 1643    Clinical Impression Statement Pt has made improvement in shoulder range of motion and knows home exercise and lymphedema risk reduction.  All goals have been met.  Pt plans to continue walking in addition to working and radiation treatment and will add resisitance or commiity exercise later.  She is ready to discharge from this episode    Rehab Potential Good   Clinical Impairments Affecting Rehab Potential ongoing radiation    PT Frequency 1x / week   PT Duration 4 weeks   PT Next Visit Plan discharge this episode    Consulted and Agree with Plan of Care Patient      Patient will  benefit from skilled therapeutic intervention in order to improve the following deficits and impairments:  Decreased knowledge of precautions, Decreased strength, Decreased range of motion, Increased fascial restricitons  Visit Diagnosis: Stiffness of left shoulder, not elsewhere classified  Acute pain of left shoulder  Muscle weakness (generalized)     Problem List Patient Active Problem List   Diagnosis Date Noted  . 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 Antineoplastic chemotherapy induced anemia 08/27/2015  .  Breast cancer of lower-inner quadrant of left female breast ([place]) 07/03/2015  . Left carotid artery stenosis   . Psoriasis   . Hormone replacement therapy   . Smoker   . Episcleritis    PHYSICAL THERAPY DISCHARGE SUMMARY  Visits from Start of Care: 3  Current functional level related to goals / functional outcomes: As above    Remaining deficits: Intermittent pain, ongoing radiation and deconditioning   Education / Equipment: Home exercise, lymphedema risk reduction  Plan: Patient agrees to discharge.  Patient goals were met. Patient is being discharged due to meeting the stated rehab goals.  ?????     Donato Heinz. Owens Shark PT  [place] Levo 02/24/2016, 4:47 PM  [place], [place], [address] Phone: [phone]   Fax:  [phone]  Name: Kim Ortega MRN: [id number] Date of Birth: 1955/11/16

## 2016-02-25 ENCOUNTER — Ambulatory Visit
Admission: RE | Admit: 2016-02-25 | Discharge: 2016-02-25 | Disposition: A | Discharge status: Home / Self Care | Payer: BC Managed Care – PPO | Source: Ambulatory Visit | Attending: Radiation Oncology | Admitting: Radiation Oncology

## 2016-02-25 DIAGNOSIS — Z510 Encounter for antineoplastic radiation therapy: Secondary | ICD-10-CM | POA: Diagnosis not present

## 2016-02-26 ENCOUNTER — Ambulatory Visit
Admission: RE | Admit: 2016-02-26 | Discharge: 2016-02-26 | Disposition: A | Discharge status: Home / Self Care | Payer: BC Managed Care – PPO | Source: Ambulatory Visit | Attending: Radiation Oncology | Admitting: Radiation Oncology

## 2016-02-26 DIAGNOSIS — Z510 Encounter for antineoplastic radiation therapy: Secondary | ICD-10-CM | POA: Diagnosis not present

## 2016-02-27 ENCOUNTER — Ambulatory Visit
Admission: RE | Admit: 2016-02-27 | Discharge: 2016-02-27 | Disposition: A | Discharge status: Home / Self Care | Payer: BC Managed Care – PPO | Source: Ambulatory Visit | Attending: Radiation Oncology | Admitting: Radiation Oncology

## 2016-02-27 ENCOUNTER — Ambulatory Visit: Payer: BC Managed Care – PPO

## 2016-02-27 ENCOUNTER — Encounter: Payer: Self-pay | Admitting: Radiation Oncology

## 2016-02-27 VITALS — BP 151/88 | HR 64 | Temp 98.1°F | Resp 16 | Ht 64.0 in | Wt 122.8 lb

## 2016-02-27 DIAGNOSIS — Z170 Estrogen receptor positive status [ER+]: Principal | ICD-10-CM

## 2016-02-27 DIAGNOSIS — Z510 Encounter for antineoplastic radiation therapy: Secondary | ICD-10-CM | POA: Diagnosis not present

## 2016-02-27 DIAGNOSIS — C50312 Malignant neoplasm of lower-inner quadrant of left female breast: Secondary | ICD-10-CM

## 2016-02-27 NOTE — Progress Notes (Addendum)
Weekly rad txs left breast 9/33 completed, mild erythema near bottom of breast,skin intact, no pain, uses radiaplex bid, appetite good,no fatigue. Wt Readings from Last 3 Encounters:  02/20/16 123 lb 12.8 oz (56.2 kg)  02/13/16 122 lb 3.2 oz (55.4 kg)  02/05/16 120 lb 3.2 oz (54.5 kg)  BP (!) 151/88   Pulse 64   Temp 98.1 F (36.7 C) (Oral)   Resp 16   Ht 5\' 4"  (1.626 m)   Wt 122 lb 12.8 oz (55.7 kg)   SpO2 100%   BMI 21.08 kg/m

## 2016-02-29 NOTE — Progress Notes (Addendum)
Department of Radiation Oncology  Phone:  [phone] Fax:        [phone]  Weekly Treatment Note    Name: Kim Ortega Date: 02/29/2016 MRN: [id number] DOB: August 22, 1955   Diagnosis:     ICD-9-CM ICD-10-CM   1. Malignant neoplasm of lower-inner quadrant of left breast in female, estrogen receptor positive ([place]) 174.3 C50.312    V86.0 Z17.0      Current dose: 25.2 Gy  Current fraction: 14   MEDICATIONS: Current Outpatient Prescriptions  Medication Sig Dispense Refill  . ALPRAZolam (XANAX) 0.5 MG tablet Take 0.5 mg by mouth 3 (three) times daily as needed for anxiety.    [name] aspirin EC 81 MG tablet Take 81 mg by mouth daily.    . bisoprolol-hydrochlorothiazide (ZIAC) 10-6.25 MG tablet TAKE 1 TABLET BY MOUTH DAILY. 90 tablet 3  . Calcium Carbonate-Vitamin D (CALCIUM-D PO) Take 1 tablet by mouth 2 (two) times daily.     . cetirizine (ZYRTEC) 10 MG tablet Take 10 mg by mouth daily.    . clobetasol (OLUX) 0.05 % topical foam Apply 1 application topically daily as needed (psoriasis).   2  . desonide (DESOWEN) 0.05 % cream Apply 1 application topically daily. For psoriasis    . diclofenac sodium (VOLTAREN) 1 % GEL Apply 2 g topically 3 (three) times daily as needed (Pain).     . fluticasone (FLONASE) 50 MCG/ACT nasal spray INHALE 2 SPRAYS IN EACH NOSTRIL DAILY 16 g 11  . hyaluronate sodium (RADIAPLEXRX) GEL Apply 1 application topically 2 (two) times daily.    [name] Ketotifen Fumarate (ALLERGY EYE DROPS OP) Place 1 drop into both eyes 2 (two) times daily as needed (allergies).     . losartan (COZAAR) 50 MG tablet TAKE 1 TABLET BY MOUTH ONCE DAILY. 90 tablet 3  . Multiple Vitamin (MULTIVITAMIN WITH MINERALS) TABS tablet Take 1 tablet by mouth daily.    . non-metallic deodorant Jethro Poling) MISC Apply 1 application topically daily as needed.    [name] anastrozole (ARIMIDEX) 1 MG tablet Take 1 tablet (1 mg total) by mouth daily. (Patient not taking: Reported on 02/20/2016) 30 tablet 0  .  ibuprofen (ADVIL,MOTRIN) 200 MG tablet Take 400 mg by mouth every 6 (six) hours as needed for headache (pain).    . ondansetron (ZOFRAN) 8 MG tablet Take 1 tablet (8 mg) twice daily as needed for nausea/vomiting (Patient not taking: Reported on 02/27/2016) 60 tablet 2  . tiZANidine (ZANAFLEX) 4 MG tablet Take 4 mg by mouth at bedtime as needed (back pain).   2   No current facility-administered medications for this encounter.      ALLERGIES: Patient has no known allergies.   LABORATORY DATA:  Lab Results  Component Value Date   WBC 5.3 12/16/2015   HGB 12.2 12/16/2015   HCT 36.2 12/16/2015   MCV 110.6 (H) 12/16/2015   PLT 245 12/16/2015   Lab Results  Component Value Date   NA 139 12/16/2015   K 4.2 12/16/2015   CL 104 07/21/2015   CO2 26 12/16/2015   Lab Results  Component Value Date   ALT 20 12/16/2015   AST 22 12/16/2015   ALKPHOS 54 12/16/2015   BILITOT 0.40 12/16/2015     NARRATIVE: Kim Ortega was seen today for weekly treatment management. The chart was checked and the patient's films were reviewed.  The patient states that she continues to do well. No decrease in energy level.  Weekly rad txs left breast 9/33  completed, mild erythema near bottom of breast,skin intact, no pain, uses radiaplex bid, appetite good,no fatigue. Wt Readings from Last 3 Encounters:  02/27/16 122 lb 12.8 oz (55.7 kg)  02/20/16 123 lb 12.8 oz (56.2 kg)  02/13/16 122 lb 3.2 oz (55.4 kg)  BP (!) 151/88   Pulse 64   Temp 98.1 F (36.7 C) (Oral)   Resp 16   Ht 5\' 4"  (1.626 m)   Wt 122 lb 12.8 oz (55.7 kg)   SpO2 100%   BMI 21.08 kg/m   PHYSICAL EXAMINATION: height is 5\' 4"  (1.626 m) and weight is 122 lb 12.8 oz (55.7 kg). Her oral temperature is 98.1 F (36.7 C). Her blood pressure is 151/88 (abnormal) and her pulse is 64. Her respiration is 16 and oxygen saturation is 100%.        ASSESSMENT: The patient is doing satisfactorily with treatment.  PLAN: We will continue with the  patient's radiation treatment as planned.

## 2016-03-01 ENCOUNTER — Ambulatory Visit
Admission: RE | Admit: 2016-03-01 | Payer: BC Managed Care – PPO | Source: Ambulatory Visit | Admitting: Radiation Oncology

## 2016-03-01 ENCOUNTER — Ambulatory Visit
Admission: RE | Admit: 2016-03-01 | Discharge: 2016-03-01 | Disposition: A | Discharge status: Home / Self Care | Payer: BC Managed Care – PPO | Source: Ambulatory Visit | Attending: Radiation Oncology | Admitting: Radiation Oncology

## 2016-03-01 DIAGNOSIS — Z510 Encounter for antineoplastic radiation therapy: Secondary | ICD-10-CM | POA: Diagnosis not present

## 2016-03-02 ENCOUNTER — Ambulatory Visit
Admission: RE | Admit: 2016-03-02 | Discharge: 2016-03-02 | Disposition: A | Discharge status: Home / Self Care | Payer: BC Managed Care – PPO | Source: Ambulatory Visit | Attending: Radiation Oncology | Admitting: Radiation Oncology

## 2016-03-02 ENCOUNTER — Encounter: Payer: BC Managed Care – PPO | Admitting: Physical Therapy

## 2016-03-02 DIAGNOSIS — Z510 Encounter for antineoplastic radiation therapy: Secondary | ICD-10-CM | POA: Diagnosis not present

## 2016-03-03 ENCOUNTER — Ambulatory Visit
Admission: RE | Admit: 2016-03-03 | Discharge: 2016-03-03 | Disposition: A | Discharge status: Home / Self Care | Payer: BC Managed Care – PPO | Source: Ambulatory Visit | Attending: Radiation Oncology | Admitting: Radiation Oncology

## 2016-03-03 DIAGNOSIS — Z510 Encounter for antineoplastic radiation therapy: Secondary | ICD-10-CM | POA: Diagnosis not present

## 2016-03-04 ENCOUNTER — Ambulatory Visit
Admission: RE | Admit: 2016-03-04 | Discharge: 2016-03-04 | Disposition: A | Discharge status: Home / Self Care | Payer: BC Managed Care – PPO | Source: Ambulatory Visit | Attending: Radiation Oncology | Admitting: Radiation Oncology

## 2016-03-04 DIAGNOSIS — Z510 Encounter for antineoplastic radiation therapy: Secondary | ICD-10-CM | POA: Diagnosis not present

## 2016-03-05 ENCOUNTER — Ambulatory Visit
Admission: RE | Admit: 2016-03-05 | Discharge: 2016-03-05 | Disposition: A | Discharge status: Home / Self Care | Payer: BC Managed Care – PPO | Source: Ambulatory Visit | Attending: Radiation Oncology | Admitting: Radiation Oncology

## 2016-03-05 ENCOUNTER — Ambulatory Visit: Payer: BC Managed Care – PPO

## 2016-03-05 ENCOUNTER — Ambulatory Visit
Admission: RE | Admit: 2016-03-05 | Payer: BC Managed Care – PPO | Source: Ambulatory Visit | Admitting: Radiation Oncology

## 2016-03-05 ENCOUNTER — Encounter: Payer: Self-pay | Admitting: Radiation Oncology

## 2016-03-05 VITALS — BP 121/79 | HR 68 | Temp 98.2°F | Resp 20 | Wt 121.2 lb

## 2016-03-05 DIAGNOSIS — C50312 Malignant neoplasm of lower-inner quadrant of left female breast: Secondary | ICD-10-CM

## 2016-03-05 DIAGNOSIS — Z510 Encounter for antineoplastic radiation therapy: Secondary | ICD-10-CM | POA: Diagnosis not present

## 2016-03-05 DIAGNOSIS — Z170 Estrogen receptor positive status [ER+]: Secondary | ICD-10-CM

## 2016-03-05 MED ORDER — RADIAPLEXRX EX GEL
Freq: Once | CUTANEOUS | Status: AC
  Administered 2016-03-05: 11:00:00 via TOPICAL

## 2016-03-05 MED ORDER — RADIAPLEXRX EX GEL: Freq: Once | CUTANEOUS | Status: DC

## 2016-03-05 NOTE — Progress Notes (Deleted)
Department of Radiation Oncology  Phone:  [phone] Fax:        [phone]  Weekly Treatment Note    Name: Kim Ortega Date: 03/05/2016 MRN: [id number] DOB: 1955-04-23   Diagnosis:     ICD-9-CM ICD-10-CM   1. Malignant neoplasm of lower-inner quadrant of left breast in female, estrogen receptor positive ([place]) 174.3 C50.312    V86.0 Z17.0      Current dose: 50 Gy  Current fraction: 20   MEDICATIONS: Current Outpatient Prescriptions  Medication Sig Dispense Refill  . ALPRAZolam (XANAX) 0.5 MG tablet Take 0.5 mg by mouth 3 (three) times daily as needed for anxiety.    [name] anastrozole (ARIMIDEX) 1 MG tablet Take 1 tablet (1 mg total) by mouth daily. (Patient not taking: Reported on 02/20/2016) 30 tablet 0  . aspirin EC 81 MG tablet Take 81 mg by mouth daily.    . bisoprolol-hydrochlorothiazide (ZIAC) 10-6.25 MG tablet TAKE 1 TABLET BY MOUTH DAILY. 90 tablet 3  . Calcium Carbonate-Vitamin D (CALCIUM-D PO) Take 1 tablet by mouth 2 (two) times daily.     . cetirizine (ZYRTEC) 10 MG tablet Take 10 mg by mouth daily.    . clobetasol (OLUX) 0.05 % topical foam Apply 1 application topically daily as needed (psoriasis).   2  . desonide (DESOWEN) 0.05 % cream Apply 1 application topically daily. For psoriasis    . diclofenac sodium (VOLTAREN) 1 % GEL Apply 2 g topically 3 (three) times daily as needed (Pain).     . fluticasone (FLONASE) 50 MCG/ACT nasal spray INHALE 2 SPRAYS IN EACH NOSTRIL DAILY 16 g 11  . hyaluronate sodium (RADIAPLEXRX) GEL Apply 1 application topically 2 (two) times daily.    [name] ibuprofen (ADVIL,MOTRIN) 200 MG tablet Take 400 mg by mouth every 6 (six) hours as needed for headache (pain).    . Ketotifen Fumarate (ALLERGY EYE DROPS OP) Place 1 drop into both eyes 2 (two) times daily as needed (allergies).     . losartan (COZAAR) 50 MG tablet TAKE 1 TABLET BY MOUTH ONCE DAILY. 90 tablet 3  . Multiple Vitamin (MULTIVITAMIN WITH MINERALS) TABS tablet Take 1 tablet by  mouth daily.    . non-metallic deodorant Jethro Poling) MISC Apply 1 application topically daily as needed.    . ondansetron (ZOFRAN) 8 MG tablet Take 1 tablet (8 mg) twice daily as needed for nausea/vomiting (Patient not taking: Reported on 02/27/2016) 60 tablet 2  . tiZANidine (ZANAFLEX) 4 MG tablet Take 4 mg by mouth at bedtime as needed (back pain).   2   No current facility-administered medications for this encounter.      ALLERGIES: Patient has no known allergies.   LABORATORY DATA:  Lab Results  Component Value Date   WBC 5.3 12/16/2015   HGB 12.2 12/16/2015   HCT 36.2 12/16/2015   MCV 110.6 (H) 12/16/2015   PLT 245 12/16/2015   Lab Results  Component Value Date   NA 139 12/16/2015   K 4.2 12/16/2015   CL 104 07/21/2015   CO2 26 12/16/2015   Lab Results  Component Value Date   ALT 20 12/16/2015   AST 22 12/16/2015   ALKPHOS 54 12/16/2015   BILITOT 0.40 12/16/2015     NARRATIVE: Kim Ortega was seen today for weekly treatment management. The chart was checked and the patient's films were reviewed.  The patient states that she has continued to do very well. Some itching. She needs to use skin cream daily.  PHYSICAL EXAMINATION: weight is 121 lb 3.2 oz (55 kg). Her oral temperature is 98.2 F (36.8 C). Her blood pressure is 121/79 and her pulse is 68. Her respiration is 20.      Mild erythema in the treatment area. Overall the patient's skin looks excellent.  Desquamation.  ASSESSMENT: The patient is doing satisfactorily with treatment. She finished her final fraction of radiation treatment today.  PLAN: Follow-up in one month.

## 2016-03-05 NOTE — Addendum Note (Signed)
Encounter addended by: Jacqulyn Liner, RN on: 03/05/2016 11:11 AM<BR>    Actions taken: MAR administration accepted

## 2016-03-05 NOTE — Addendum Note (Signed)
Encounter addended by: Kyung Rudd, MD on: 03/05/2016 12:17 PM<BR>    Actions taken: Delete clinical note, Sign clinical note

## 2016-03-05 NOTE — Addendum Note (Signed)
Encounter addended by: Jacqulyn Liner, RN on: 03/05/2016 11:06 AM<BR>    Actions taken: Order list changed, Diagnosis association updated, Order Reconciliation Section accessed, Medication note saved

## 2016-03-05 NOTE — Progress Notes (Signed)
Department of Radiation Oncology  Phone:  [phone] Fax:        [phone]  Weekly Treatment Note    Name: Kim Ortega Date: 03/05/2016 MRN: [id number] DOB: 09/11/1955   Diagnosis:     ICD-9-CM ICD-10-CM   1. Malignant neoplasm of lower-inner quadrant of left female breast, unspecified estrogen receptor status ([facility]) 174.3 C50.312 hyaluronate sodium (RADIAPLEXRX) gel  2. Malignant neoplasm of lower-inner quadrant of left breast in female, estrogen receptor positive ([facility]) 174.3 C50.312 hyaluronate sodium (RADIAPLEXRX) gel   V86.0 Z17.0      Current dose: 34.2 Gy  Current fraction: 19   MEDICATIONS: Current Outpatient Prescriptions  Medication Sig Dispense Refill  . ALPRAZolam (XANAX) 0.5 MG tablet Take 0.5 mg by mouth 3 (three) times daily as needed for anxiety.    [name] anastrozole (ARIMIDEX) 1 MG tablet Take 1 tablet (1 mg total) by mouth daily. (Patient not taking: Reported on 02/20/2016) 30 tablet 0  . aspirin EC 81 MG tablet Take 81 mg by mouth daily.    . bisoprolol-hydrochlorothiazide (ZIAC) 10-6.25 MG tablet TAKE 1 TABLET BY MOUTH DAILY. 90 tablet 3  . Calcium Carbonate-Vitamin D (CALCIUM-D PO) Take 1 tablet by mouth 2 (two) times daily.     . cetirizine (ZYRTEC) 10 MG tablet Take 10 mg by mouth daily.    . clobetasol (OLUX) 0.05 % topical foam Apply 1 application topically daily as needed (psoriasis).   2  . desonide (DESOWEN) 0.05 % cream Apply 1 application topically daily. For psoriasis    . diclofenac sodium (VOLTAREN) 1 % GEL Apply 2 g topically 3 (three) times daily as needed (Pain).     . fluticasone (FLONASE) 50 MCG/ACT nasal spray INHALE 2 SPRAYS IN EACH NOSTRIL DAILY 16 g 11  . hyaluronate sodium (RADIAPLEXRX) GEL Apply 1 application topically 2 (two) times daily.    [name] ibuprofen (ADVIL,MOTRIN) 200 MG tablet Take 400 mg by mouth every 6 (six) hours as needed for headache (pain).    . Ketotifen Fumarate (ALLERGY EYE DROPS OP) Place 1 drop into both eyes 2  (two) times daily as needed (allergies).     . losartan (COZAAR) 50 MG tablet TAKE 1 TABLET BY MOUTH ONCE DAILY. 90 tablet 3  . Multiple Vitamin (MULTIVITAMIN WITH MINERALS) TABS tablet Take 1 tablet by mouth daily.    . non-metallic deodorant Jethro Poling) MISC Apply 1 application topically daily as needed.    . ondansetron (ZOFRAN) 8 MG tablet Take 1 tablet (8 mg) twice daily as needed for nausea/vomiting (Patient not taking: Reported on 02/27/2016) 60 tablet 2  . tiZANidine (ZANAFLEX) 4 MG tablet Take 4 mg by mouth at bedtime as needed (back pain).   2   Current Facility-Administered Medications  Medication Dose Route Frequency Provider Last Rate Last Dose  . hyaluronate sodium (RADIAPLEXRX) gel   Topical Once Hayden Pedro, PA-C         ALLERGIES: Patient has no known allergies.   LABORATORY DATA:  Lab Results  Component Value Date   WBC 5.3 12/16/2015   HGB 12.2 12/16/2015   HCT 36.2 12/16/2015   MCV 110.6 (H) 12/16/2015   PLT 245 12/16/2015   Lab Results  Component Value Date   NA 139 12/16/2015   K 4.2 12/16/2015   CL 104 07/21/2015   CO2 26 12/16/2015   Lab Results  Component Value Date   ALT 20 12/16/2015   AST 22 12/16/2015   ALKPHOS 54 12/16/2015   BILITOT 0.40  12/16/2015     NARRATIVE: Kim Ortega was seen today for weekly treatment management. The chart was checked and the patient's films were reviewed.  The patient states she is doing well. She is getting 2 treatments today. Using skin cream daily without major changes in terms of irritation.  PHYSICAL EXAMINATION: weight is 121 lb 3.2 oz (55 kg). Her oral temperature is 98.2 F (36.8 C). Her blood pressure is 121/79 and her pulse is 68. Her respiration is 20.      Mild erythema in the treatment area. Overall her skin looks excellent is in her treatment.  ASSESSMENT: The patient is doing satisfactorily with treatment.  PLAN: We will continue with the patient's radiation treatment as planned.

## 2016-03-05 NOTE — Addendum Note (Signed)
Encounter addended by: Doreen Beam, RN on: 03/05/2016 11:24 AM<BR>    Actions taken: Visit diagnoses modified, Order list changed, Diagnosis association updated

## 2016-03-05 NOTE — Progress Notes (Addendum)
weekly rad txs left breast  19/33 completed , mild erythema ,skin intact, using radiaplex bid 10:47 AM  No c/o BP 121/79 (BP Location: Right Arm, Patient Position: Sitting, Cuff Size: Normal)   Pulse 68   Temp 98.2 F (36.8 C) (Oral)   Resp 20   Wt 121 lb 3.2 oz (55 kg)   BMI 20.80 kg/m   Wt Readings from Last 3 Encounters:  03/05/16 121 lb 3.2 oz (55 kg)  02/27/16 122 lb 12.8 oz (55.7 kg)  02/20/16 123 lb 12.8 oz (56.2 kg)     .n

## 2016-03-08 ENCOUNTER — Ambulatory Visit
Admission: RE | Admit: 2016-03-08 | Discharge: 2016-03-08 | Disposition: A | Discharge status: Home / Self Care | Payer: BC Managed Care – PPO | Source: Ambulatory Visit | Attending: Radiation Oncology | Admitting: Radiation Oncology

## 2016-03-08 ENCOUNTER — Ambulatory Visit: Payer: BC Managed Care – PPO | Admitting: Radiation Oncology

## 2016-03-08 DIAGNOSIS — Z510 Encounter for antineoplastic radiation therapy: Secondary | ICD-10-CM | POA: Diagnosis not present

## 2016-03-09 ENCOUNTER — Ambulatory Visit
Admission: RE | Admit: 2016-03-09 | Discharge: 2016-03-09 | Disposition: A | Discharge status: Home / Self Care | Payer: BC Managed Care – PPO | Source: Ambulatory Visit | Attending: Radiation Oncology | Admitting: Radiation Oncology

## 2016-03-09 DIAGNOSIS — Z510 Encounter for antineoplastic radiation therapy: Secondary | ICD-10-CM | POA: Diagnosis not present

## 2016-03-10 ENCOUNTER — Ambulatory Visit
Admission: RE | Admit: 2016-03-10 | Discharge: 2016-03-10 | Disposition: A | Discharge status: Home / Self Care | Payer: BC Managed Care – PPO | Source: Ambulatory Visit | Attending: Radiation Oncology | Admitting: Radiation Oncology

## 2016-03-10 ENCOUNTER — Ambulatory Visit
Admission: RE | Admit: 2016-03-10 | Payer: BC Managed Care – PPO | Source: Ambulatory Visit | Admitting: Radiation Oncology

## 2016-03-10 DIAGNOSIS — Z510 Encounter for antineoplastic radiation therapy: Secondary | ICD-10-CM | POA: Diagnosis not present

## 2016-03-11 ENCOUNTER — Ambulatory Visit
Admission: RE | Admit: 2016-03-11 | Discharge: 2016-03-11 | Disposition: A | Discharge status: Home / Self Care | Payer: BC Managed Care – PPO | Source: Ambulatory Visit | Attending: Radiation Oncology | Admitting: Radiation Oncology

## 2016-03-11 DIAGNOSIS — Z510 Encounter for antineoplastic radiation therapy: Secondary | ICD-10-CM | POA: Diagnosis not present

## 2016-03-12 ENCOUNTER — Encounter: Payer: Self-pay | Admitting: Radiation Oncology

## 2016-03-12 ENCOUNTER — Ambulatory Visit: Payer: BC Managed Care – PPO | Attending: Radiation Oncology | Admitting: Radiation Oncology

## 2016-03-12 ENCOUNTER — Ambulatory Visit
Admission: RE | Admit: 2016-03-12 | Discharge: 2016-03-12 | Disposition: A | Discharge status: Home / Self Care | Payer: BC Managed Care – PPO | Source: Ambulatory Visit | Attending: Radiation Oncology | Admitting: Radiation Oncology

## 2016-03-12 ENCOUNTER — Ambulatory Visit (INDEPENDENT_AMBULATORY_CARE_PROVIDER_SITE_OTHER): Payer: BC Managed Care – PPO | Admitting: Family Medicine

## 2016-03-12 ENCOUNTER — Encounter: Payer: Self-pay | Admitting: Family Medicine

## 2016-03-12 VITALS — BP 120/82 | HR 72 | Temp 97.5°F | Resp 16 | Ht 64.0 in | Wt 120.0 lb

## 2016-03-12 DIAGNOSIS — Z510 Encounter for antineoplastic radiation therapy: Secondary | ICD-10-CM | POA: Diagnosis not present

## 2016-03-12 DIAGNOSIS — B029 Zoster without complications: Secondary | ICD-10-CM

## 2016-03-12 MED ORDER — HYDROCODONE-ACETAMINOPHEN 5-325 MG PO TABS: 1.0000 | ORAL_TABLET | Freq: Four times a day (QID) | ORAL | 0 refills | Status: DC | PRN

## 2016-03-12 MED ORDER — VALACYCLOVIR HCL 1 G PO TABS: 1000.0000 mg | ORAL_TABLET | Freq: Three times a day (TID) | ORAL | 0 refills | Status: DC

## 2016-03-12 NOTE — Progress Notes (Signed)
Subjective:    Patient ID: Kim Ortega, female    DOB: 1955-03-10, 61 y.o.   MRN: [id number]  HPI Less than 38 hours ago, the patient developed a painful blistering rash in her left inguinal fold. It radiates from her gluteus around to her medial left thigh in a dermatomal pattern. The rash consists of erythematous papules and vesicles coalesced into linear groupings consistent with shingles Past Medical History:  Diagnosis Date  . Allergy   . Anxiety    mild no regular meds  . Arthritis    psoriatic arthritis -back  . Breast cancer ([place]) 06/25/15 bx   left breastinvasive ductal ca ,grade 1  . Episcleritis   . Hormone replacement therapy   . Hypertension   . Left carotid artery stenosis    40-59% (01/2015)  . Psoriasis   . Smoker    Past Surgical History:  Procedure Laterality Date  . ABDOMINAL HYSTERECTOMY    . BREAST LUMPECTOMY WITH RADIOACTIVE SEED AND SENTINEL LYMPH NODE BIOPSY Left 01/08/2016   Procedure: LEFT BREAST LUMPECTOMY WITH RADIOACTIVE SEED AND SENTINEL LYMPH NODE BIOPSY, BLUE DYE INJECTION;  Surgeon: Rolm Bookbinder, MD;  Location: [facility];  Service: General;  Laterality: Left;  . cervical acdf     C5-6- fusion with plates/screws "Dr. Sherwood Gambler" '12  . COLONOSCOPY WITH PROPOFOL N/A 04/16/2014   Procedure: COLONOSCOPY WITH PROPOFOL;  Surgeon: Garlan Fair, MD;  Location: [facility];  Service: Endoscopy;  Laterality: N/A;  . COLPOSCOPY    . laporotomy     Ovarian cyst  . PORT-A-CATH REMOVAL Right 01/08/2016   Procedure: REMOVAL PORT-A-CATH;  Surgeon: Rolm Bookbinder, MD;  Location: [facility];  Service: General;  Laterality: Right;  . PORTACATH PLACEMENT N/A 07/24/2015   Procedure: INSERTION PORT-A-CATH WITH [place];  Surgeon: Rolm Bookbinder, MD;  Location: [facility];  Service: General;  Laterality: N/A;  . TONSILLECTOMY     child   Current Outpatient Prescriptions on File Prior to Visit  Medication Sig Dispense Refill  .  ALPRAZolam (XANAX) 0.5 MG tablet Take 0.5 mg by mouth 3 (three) times daily as needed for anxiety.    [name] aspirin EC 81 MG tablet Take 81 mg by mouth daily.    . bisoprolol-hydrochlorothiazide (ZIAC) 10-6.25 MG tablet TAKE 1 TABLET BY MOUTH DAILY. 90 tablet 3  . Calcium Carbonate-Vitamin D (CALCIUM-D PO) Take 1 tablet by mouth 2 (two) times daily.     . cetirizine (ZYRTEC) 10 MG tablet Take 10 mg by mouth daily.    . clobetasol (OLUX) 0.05 % topical foam Apply 1 application topically daily as needed (psoriasis).   2  . desonide (DESOWEN) 0.05 % cream Apply 1 application topically daily. For psoriasis    . diclofenac sodium (VOLTAREN) 1 % GEL Apply 2 g topically 3 (three) times daily as needed (Pain).     . fluticasone (FLONASE) 50 MCG/ACT nasal spray INHALE 2 SPRAYS IN EACH NOSTRIL DAILY 16 g 11  . hyaluronate sodium (RADIAPLEXRX) GEL Apply 1 application topically 2 (two) times daily.    [name] ibuprofen (ADVIL,MOTRIN) 200 MG tablet Take 400 mg by mouth every 6 (six) hours as needed for headache (pain).    . Ketotifen Fumarate (ALLERGY EYE DROPS OP) Place 1 drop into both eyes 2 (two) times daily as needed (allergies).     . losartan (COZAAR) 50 MG tablet TAKE 1 TABLET BY MOUTH ONCE DAILY. 90 tablet 3  . Multiple Vitamin (MULTIVITAMIN WITH MINERALS) TABS tablet  Take 1 tablet by mouth daily.    . non-metallic deodorant Jethro Poling) MISC Apply 1 application topically daily as needed.    [name] tiZANidine (ZANAFLEX) 4 MG tablet Take 4 mg by mouth at bedtime as needed (back pain).   2  . anastrozole (ARIMIDEX) 1 MG tablet Take 1 tablet (1 mg total) by mouth daily. (Patient not taking: Reported on 02/20/2016) 30 tablet 0  . ondansetron (ZOFRAN) 8 MG tablet Take 1 tablet (8 mg) twice daily as needed for nausea/vomiting (Patient not taking: Reported on 02/27/2016) 60 tablet 2   No current facility-administered medications on file prior to visit.    No Known Allergies Social History   Social History  . Marital status:  Married    Spouse name: N/A  . Number of children: N/A  . Years of education: N/A   Occupational History  . Not on file.   Social History Main Topics  . Smoking status: Current Every Day Smoker    Packs/day: 0.50    Years: 20.00    Types: Cigarettes  . Smokeless tobacco: Never Used  . Alcohol use 6.0 oz/week    10 Glasses of wine per week     Comment: Glasses of wine several times a week  . Drug use: No  . Sexual activity: Yes    Birth control/ protection: Surgical     Comment: married   Other Topics Concern  . Not on file   Social History Narrative  . No narrative on file      Review of Systems  All other systems reviewed and are negative.      Objective:   Physical Exam  Cardiovascular: Normal rate, regular rhythm and normal heart sounds.   Pulmonary/Chest: Effort normal and breath sounds normal.  Abdominal: Soft. Bowel sounds are normal.  Skin: Rash noted. There is erythema.  Vitals reviewed.         Assessment & Plan:  Herpes zoster without complication  Valtrex 1 g by mouth 3 times a day for 10 days. Norco 5/325 one by mouth every 6 hours when necessary pain #60. Return for shingles vaccine/Zostavax at her convenience

## 2016-03-15 ENCOUNTER — Ambulatory Visit
Admission: RE | Admit: 2016-03-15 | Discharge: 2016-03-15 | Disposition: A | Discharge status: Home / Self Care | Payer: BC Managed Care – PPO | Source: Ambulatory Visit | Attending: Radiation Oncology | Admitting: Radiation Oncology

## 2016-03-15 DIAGNOSIS — Z510 Encounter for antineoplastic radiation therapy: Secondary | ICD-10-CM | POA: Diagnosis not present

## 2016-03-16 ENCOUNTER — Ambulatory Visit
Admission: RE | Admit: 2016-03-16 | Discharge: 2016-03-16 | Disposition: A | Discharge status: Home / Self Care | Payer: BC Managed Care – PPO | Source: Ambulatory Visit | Attending: Radiation Oncology | Admitting: Radiation Oncology

## 2016-03-16 DIAGNOSIS — Z510 Encounter for antineoplastic radiation therapy: Secondary | ICD-10-CM | POA: Diagnosis not present

## 2016-03-17 ENCOUNTER — Ambulatory Visit
Admission: RE | Admit: 2016-03-17 | Discharge: 2016-03-17 | Disposition: A | Discharge status: Home / Self Care | Payer: BC Managed Care – PPO | Source: Ambulatory Visit | Attending: Radiation Oncology | Admitting: Radiation Oncology

## 2016-03-17 ENCOUNTER — Ambulatory Visit: Payer: BC Managed Care – PPO

## 2016-03-17 DIAGNOSIS — Z510 Encounter for antineoplastic radiation therapy: Secondary | ICD-10-CM | POA: Diagnosis not present

## 2016-03-18 ENCOUNTER — Ambulatory Visit
Admission: RE | Admit: 2016-03-18 | Discharge: 2016-03-18 | Disposition: A | Discharge status: Home / Self Care | Payer: BC Managed Care – PPO | Source: Ambulatory Visit | Attending: Radiation Oncology | Admitting: Radiation Oncology

## 2016-03-18 DIAGNOSIS — Z510 Encounter for antineoplastic radiation therapy: Secondary | ICD-10-CM | POA: Diagnosis not present

## 2016-03-19 ENCOUNTER — Ambulatory Visit
Admission: RE | Admit: 2016-03-19 | Discharge: 2016-03-19 | Disposition: A | Discharge status: Home / Self Care | Payer: BC Managed Care – PPO | Source: Ambulatory Visit | Attending: Radiation Oncology | Admitting: Radiation Oncology

## 2016-03-19 DIAGNOSIS — C50312 Malignant neoplasm of lower-inner quadrant of left female breast: Secondary | ICD-10-CM

## 2016-03-19 DIAGNOSIS — Z510 Encounter for antineoplastic radiation therapy: Secondary | ICD-10-CM | POA: Diagnosis not present

## 2016-03-19 MED ORDER — BIAFINE EX EMUL
Freq: Every day | CUTANEOUS | Status: DC
  Administered 2016-03-19: 15:00:00 via TOPICAL

## 2016-03-22 ENCOUNTER — Ambulatory Visit
Admission: RE | Admit: 2016-03-22 | Discharge: 2016-03-22 | Disposition: A | Discharge status: Home / Self Care | Payer: BC Managed Care – PPO | Source: Ambulatory Visit | Attending: Radiation Oncology | Admitting: Radiation Oncology

## 2016-03-22 ENCOUNTER — Ambulatory Visit: Payer: BC Managed Care – PPO

## 2016-03-22 DIAGNOSIS — Z510 Encounter for antineoplastic radiation therapy: Secondary | ICD-10-CM | POA: Diagnosis not present

## 2016-03-23 ENCOUNTER — Ambulatory Visit: Payer: BC Managed Care – PPO

## 2016-03-23 ENCOUNTER — Encounter: Payer: Self-pay | Admitting: Radiation Oncology

## 2016-03-23 ENCOUNTER — Ambulatory Visit
Admission: RE | Admit: 2016-03-23 | Discharge: 2016-03-23 | Disposition: A | Discharge status: Home / Self Care | Payer: BC Managed Care – PPO | Source: Ambulatory Visit | Attending: Radiation Oncology | Admitting: Radiation Oncology

## 2016-03-23 DIAGNOSIS — Z510 Encounter for antineoplastic radiation therapy: Secondary | ICD-10-CM | POA: Diagnosis not present

## 2016-03-24 ENCOUNTER — Ambulatory Visit: Payer: BC Managed Care – PPO

## 2016-03-24 NOTE — Progress Notes (Signed)
  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   Indication for treatment:  Curative       Radiation treatment dates:   02/10/2016 to 03/23/2016  Site/dose:   The patient initially received a dose of 50.4 Gy in 28 fractions to the breast using whole-breast tangent fields. This was delivered using a 3-D conformal technique. The patient then received a boost to the seroma. This delivered an additional 10 Gy in 5 fractions using an Isodose plan // 12 MeV. The total dose was 60.4 Gy.  Narrative: The patient tolerated radiation treatment relatively well.   The patient had some expected skin irritation as she progressed during treatment including, erythema. Moist desquamation was not present at the end of treatment. Patient using radiaplex bid and cortisone cream for itching.   Plan: The patient has completed radiation treatment. The patient will return to radiation oncology clinic for routine followup in one month. I advised the patient to call or return sooner if they have any questions or concerns related to their recovery or treatment. ________________________________  Jodelle Gross, M.D., Ph.D.  This document serves as a record of services personally performed by Kyung Rudd, MD. It was created on his behalf by Arlyce Harman, a trained medical scribe. The creation of this record is based on the scribe's personal observations and the provider's statements to them. This document has been checked and approved by the attending provider.

## 2016-03-25 ENCOUNTER — Ambulatory Visit: Payer: BC Managed Care – PPO

## 2016-03-25 ENCOUNTER — Telehealth: Payer: Self-pay | Admitting: *Deleted

## 2016-03-25 NOTE — Telephone Encounter (Signed)
  Oncology Nurse Navigator Documentation  Navigator Location: [facility] (03/25/16 1400)   )Navigator Encounter Type: Telephone (03/25/16 1400) Telephone: [place] Call (03/25/16 1400)                     Treatment Phase: Final Radiation Tx (03/25/16 1400)                            Time Spent with Patient: 15 (03/25/16 1400)

## 2016-03-29 ENCOUNTER — Other Ambulatory Visit: Payer: Self-pay | Admitting: Hematology and Oncology

## 2016-03-29 ENCOUNTER — Telehealth: Payer: Self-pay | Admitting: Hematology and Oncology

## 2016-03-29 MED ORDER — ANASTROZOLE 1 MG PO TABS: 1.0000 mg | ORAL_TABLET | Freq: Every day | ORAL | 3 refills | Status: DC

## 2016-03-29 NOTE — Telephone Encounter (Signed)
sw pt to confirm [facility] appt in July per LOS °

## 2016-04-20 ENCOUNTER — Telehealth: Payer: Self-pay | Admitting: *Deleted

## 2016-04-20 NOTE — Telephone Encounter (Signed)
Called patient to alter fu on 04-28-16 to 04-27-16 @ 8:30 am, lvm for a return call

## 2016-04-23 ENCOUNTER — Other Ambulatory Visit: Payer: Self-pay | Admitting: Family Medicine

## 2016-04-23 NOTE — Progress Notes (Signed)
Kim Ortega. [place] 61 y.o. 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   Skin status: Left breast with normal color skin intact; had some tenderness. What lotion are you using? Using lotion with vitamin E. Have you seen med onc? If not, when is appointment:01-15-16 Dr. Lindi Adie  patient has anastrozole pills which she will start after radiation is complete.Next appointment 05-20-16  I will see the patient  In May for follow-up with labs. If they are ER+, have they started Al or Tamoxifen? If not, why? Anastrozole Discuss survivorship appointment. 07-15-16 Have you had a mammogram scheduled? Not scheduled yet Offer referral to [facility]. Will receive at the survivorship appointment.07-15-16 Appetite:Good Pain:None Fatigue:None,doing daily routine without difficulty. Arm mobility:Able to raise left arm without difficulty. Lymphedema:None, has a compression sleeve to wear if she flies. Wt Readings from Last 3 Encounters:  04/27/16 121 lb 6.4 oz (55.1 kg)  03/12/16 120 lb (54.4 kg)  03/05/16 121 lb 3.2 oz (55 kg)  BP (!) 119/52   Pulse 71   Temp 98 F (36.7 C) (Oral)   Resp 16   Ht 5\' 4"  (1.626 m)   Wt 121 lb 6.4 oz (55.1 kg)   SpO2 99%   BMI 20.84 kg/m

## 2016-04-27 ENCOUNTER — Encounter: Payer: Self-pay | Admitting: Radiation Oncology

## 2016-04-27 ENCOUNTER — Ambulatory Visit
Admission: RE | Admit: 2016-04-27 | Discharge: 2016-04-27 | Disposition: A | Discharge status: Home / Self Care | Payer: BC Managed Care – PPO | Source: Ambulatory Visit | Attending: Radiation Oncology | Admitting: Radiation Oncology

## 2016-04-27 VITALS — BP 119/52 | HR 71 | Temp 98.0°F | Resp 16 | Ht 64.0 in | Wt 121.4 lb

## 2016-04-27 DIAGNOSIS — Z170 Estrogen receptor positive status [ER+]: Secondary | ICD-10-CM | POA: Diagnosis not present

## 2016-04-27 DIAGNOSIS — C50912 Malignant neoplasm of unspecified site of left female breast: Secondary | ICD-10-CM | POA: Insufficient documentation

## 2016-04-27 DIAGNOSIS — C50312 Malignant neoplasm of lower-inner quadrant of left female breast: Secondary | ICD-10-CM

## 2016-04-27 NOTE — Progress Notes (Signed)
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   Interval Since Last Radiation:  5 weeks   02/10/2016 to 03/23/2016: The patient initially received a dose of 50.4 Gy in 28 fractions to the breast using whole-breast tangent fields. This was delivered using a 3-D conformal technique. The patient then received a boost to the seroma. This delivered an additional 10 Gy in 5 fractions using an Isodose plan // 12 MeV. The total dose was 60.4 Gy.  Narrative:  The patient returns today for routine follow-up. During treatment she did very well with radiotherapy and did not have significant desquamation.                             On review of systems, the patient states she's doing great. She reports she has an upcoming beach trip planned, and denies any concerns with skin changes, chest pain, shortness of breath, fevers, or chills. No other complaints are verbalized.  ALLERGIES:  has No Known Allergies.  Meds: Current Outpatient Prescriptions  Medication Sig Dispense Refill  . ALPRAZolam (XANAX) 0.5 MG tablet Take 0.5 mg by mouth 3 (three) times daily as needed for anxiety.    [name] anastrozole (ARIMIDEX) 1 MG tablet Take 1 tablet (1 mg total) by mouth daily. 90 tablet 3  . aspirin EC 81 MG tablet Take 81 mg by mouth daily.    . bisoprolol-hydrochlorothiazide (ZIAC) 10-6.25 MG tablet TAKE 1 TABLET BY MOUTH DAILY. 90 tablet 3  . Calcium Carbonate-Vitamin D (CALCIUM-D PO) Take 1 tablet by mouth 2 (two) times daily.     . cetirizine (ZYRTEC) 10 MG tablet Take 10 mg by mouth daily.    . clobetasol (OLUX) 0.05 % topical foam Apply 1 application topically daily as needed (psoriasis).   2  . desonide (DESOWEN) 0.05 % cream Apply 1 application topically  daily. For psoriasis    . diclofenac sodium (VOLTAREN) 1 % GEL Apply 2 g topically 3 (three) times daily as needed (Pain).     . fluticasone (FLONASE) 50 MCG/ACT nasal spray INHALE 2 SPRAYS IN EACH NOSTRIL DAILY 16 g 11  . ibuprofen (ADVIL,MOTRIN) 200 MG tablet Take 400 mg by mouth every 6 (six) hours as needed for headache (pain).    . Ketotifen Fumarate (ALLERGY EYE DROPS OP) Place 1 drop into both eyes 2 (two) times daily as needed (allergies).     . losartan (COZAAR) 50 MG tablet TAKE 1 TABLET BY MOUTH ONCE DAILY. 90 tablet 3  . Multiple Vitamin (MULTIVITAMIN WITH MINERALS) TABS tablet Take 1 tablet by mouth daily.    . ondansetron (ZOFRAN) 8 MG tablet Take 1 tablet (8 mg) twice daily as needed for nausea/vomiting (Patient not taking: Reported on 02/27/2016) 60 tablet 2  . tiZANidine (ZANAFLEX) 4 MG tablet Take 4 mg by mouth at bedtime as needed (back pain).   2   No current facility-administered medications for this encounter.     Physical Findings:  height is 5\' 4"  (1.626 m) and weight is 121 lb 6.4 oz (55.1 kg). Her oral temperature is 98 F (36.7 C). Her blood pressure is 119/52 (abnormal) and her pulse is 71. Her respiration is 16 and oxygen  saturation is 99%.  Pain Assessment Pain Score: 0-No pain/10 In general this is a well appearing caucasian female in no acute distress. She's alert and oriented x4 and appropriate throughout the examination. Cardiopulmonary assessment is negative for acute distress and she exhibits normal effort. The left breast was examined and reveals no desquamation.   Lab Findings: Lab Results  Component Value Date   WBC 5.3 12/16/2015   HGB 12.2 12/16/2015   HCT 36.2 12/16/2015   MCV 110.6 (H) 12/16/2015   PLT 245 12/16/2015     Radiographic Findings: No results found.  Impression/Plan: 1. 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 The patient has been doing well since completion of radiotherapy. We discussed that we  would be happy to continue to follow her as needed, but she will also continue to follow up with Dr. Lindi Adie in medical oncology. She was counseled on skin care as well as measures to avoid sun exposure to this area.  2. Survivorship. The patient will follow up with the survivorship clinic this summer and we detailed the nature of the visit.     Carola Rhine, PAC

## 2016-04-28 ENCOUNTER — Ambulatory Visit: Payer: Self-pay | Admitting: Radiation Oncology

## 2016-05-20 ENCOUNTER — Ambulatory Visit (HOSPITAL_BASED_OUTPATIENT_CLINIC_OR_DEPARTMENT_OTHER): Payer: BC Managed Care – PPO | Admitting: Hematology and Oncology

## 2016-05-20 ENCOUNTER — Encounter: Payer: Self-pay | Admitting: Hematology and Oncology

## 2016-05-20 ENCOUNTER — Other Ambulatory Visit (HOSPITAL_BASED_OUTPATIENT_CLINIC_OR_DEPARTMENT_OTHER): Payer: BC Managed Care – PPO

## 2016-05-20 DIAGNOSIS — C50312 Malignant neoplasm of lower-inner quadrant of left female breast: Secondary | ICD-10-CM

## 2016-05-20 DIAGNOSIS — N951 Menopausal and female climacteric states: Secondary | ICD-10-CM | POA: Diagnosis not present

## 2016-05-20 DIAGNOSIS — Z170 Estrogen receptor positive status [ER+]: Secondary | ICD-10-CM

## 2016-05-20 DIAGNOSIS — M25562 Pain in left knee: Secondary | ICD-10-CM | POA: Diagnosis not present

## 2016-05-20 LAB — CBC WITH DIFFERENTIAL/PLATELET
BASO%: 0.7 % (ref 0.0–2.0)
Basophils Absolute: 0 10*3/uL (ref 0.0–0.1)
EOS%: 0.8 % (ref 0.0–7.0)
Eosinophils Absolute: 0.1 10*3/uL (ref 0.0–0.5)
HEMATOCRIT: 36.4 % (ref 34.8–46.6)
HEMOGLOBIN: 12.7 g/dL (ref 11.6–15.9)
LYMPH#: 1.3 10*3/uL (ref 0.9–3.3)
LYMPH%: 18.6 % (ref 14.0–49.7)
MCH: 35.9 pg — ABNORMAL HIGH (ref 25.1–34.0)
MCHC: 34.9 g/dL (ref 31.5–36.0)
MCV: 102.8 fL — ABNORMAL HIGH (ref 79.5–101.0)
MONO#: 0.6 10*3/uL (ref 0.1–0.9)
MONO%: 8.9 % (ref 0.0–14.0)
NEUT#: 5 10*3/uL (ref 1.5–6.5)
NEUT%: 71 % (ref 38.4–76.8)
Platelets: 248 10*3/uL (ref 145–400)
RBC: 3.54 10*6/uL — ABNORMAL LOW (ref 3.70–5.45)
RDW: 13.2 % (ref 11.2–14.5)
WBC: 7.1 10*3/uL (ref 3.9–10.3)

## 2016-05-20 LAB — COMPREHENSIVE METABOLIC PANEL
ALT: 29 U/L (ref 0–55)
AST: 26 U/L (ref 5–34)
Albumin: 4.3 g/dL (ref 3.5–5.0)
Alkaline Phosphatase: 69 U/L (ref 40–150)
Anion Gap: 9 mEq/L (ref 3–11)
BUN: 14.4 mg/dL (ref 7.0–26.0)
CALCIUM: 10 mg/dL (ref 8.4–10.4)
CO2: 28 mEq/L (ref 22–29)
CREATININE: 0.8 mg/dL (ref 0.6–1.1)
Chloride: 104 mEq/L (ref 98–109)
EGFR: 81 mL/min/{1.73_m2} — ABNORMAL LOW (ref >90)
GLUCOSE: 100 mg/dL (ref 70–140)
Potassium: 3.8 mEq/L (ref 3.5–5.1)
SODIUM: 141 meq/L (ref 136–145)
Total Bilirubin: 0.56 mg/dL (ref 0.20–1.20)
Total Protein: 7.2 g/dL (ref 6.4–8.3)

## 2016-05-20 NOTE — Assessment & Plan Note (Signed)
Left breast biopsy 8:30 position: IDC grade 1, ER 90%, PR 0%, 50%, HER-2 Negative Ratio 1.21, Irregular Mass Left Breast 1 Cm from Nipple 2.5 x 2 x 2.5 Cm with Distortion,  T2 N0 Stage II a Clinical Stage Oncotype DX score 52, 34% risk of recurrence without chemotherapy  Treatment Summary: 1. Neoadjuvant chemotherapy with dose dense Adriamycin and Cytoxan 4 followed by Abraxane weekly 12; started 07/30/2015 completed 12/16/2015  2. 1/4/17Left Lumpectomy: IDC grade 1, 0.2 cm, Margins Neg, 0/1 LN Neg; ypT1aN0 3. Adjuvant radiation therapy 02/10/16- 03/23/16  Plan: Adjuvant anastrozole 1 mg daily 5-10 years  Anastrozole toxicities:   Return to clinic in 6 months for follow-up

## 2016-05-20 NOTE — Progress Notes (Signed)
Patient Care Team: [name], MD as PCP - General (Family Medicine)  DIAGNOSIS:  Encounter Diagnosis  Name Primary?  . Malignant neoplasm of lower-inner quadrant of left breast in female, estrogen receptor positive ([place])     SUMMARY OF ONCOLOGIC HISTORY:   Breast cancer of lower-inner quadrant of left female breast ([place])   06/25/2015 Initial Diagnosis    Left breast biopsy 8:30 position: IDC grade 1, ER 90%, PR 0%, 50%, HER-2 Negative Ratio 1.21, Irregular Mass Left Breast 1 Cm from Nipple 2.5 x 2 x 2.5 Cm with Distortion, T2 N0 Stage II a Clinical Stage, Oncotype DX score 52, 34% ROR      07/30/2015 - 12/16/2015 Neo-Adjuvant Chemotherapy    Neoadjuvant chemotherapy with dose dense Adriamycin and Cytoxan 4 followed by Abraxane weekly 12      12/18/2015 Breast MRI    Excellent interval response to chemotherapy with only minimal residual enhancement, 2 tiny rounded regions along superior margin, unchanged, no abnormal lymph nodes       01/08/2016 Surgery    Left Lumpectomy: IDC grade 1, 0.2 cm, Margins Neg, 0/1 LN Neg; ypT1aN0      02/10/2016 - 03/23/2016 Radiation Therapy    Adjuvant radiation      04/04/2016 -  Anti-estrogen oral therapy    Anastrozole 1 mg daily       CHIEF COMPLIANT: Follow-up after adjuvant radiation, follow-up on antiestrogen therapy  INTERVAL HISTORY: PATRICK SOHM is a 61 year old with above-mentioned history of left breast cancer treated with neoadjuvant chemotherapy followed by lumpectomy and radiation and is currently on anastrozole therapy. She does patient is tolerating anastrozole extremely well. She does have hot flashes especially at nighttime and he has to pull the covers periodically. She denies any arthralgias or myalgias other than marked mild stiffness of her knee joint after she rests for a while. When she starts mowing her symptoms resolve.   REVIEW OF SYSTEMS:   Constitutional: Denies fevers, chills or abnormal weight  loss Eyes: Denies blurriness of vision Ears, nose, mouth, throat, and face: Denies mucositis or sore throat Respiratory: Denies cough, dyspnea or wheezes Cardiovascular: Denies palpitation, chest discomfort Gastrointestinal:  Denies nausea, heartburn or change in bowel habits Skin: Denies abnormal skin rashes Lymphatics: Denies new lymphadenopathy or easy bruising Neurological:Denies numbness, tingling or new weaknesses Behavioral/Psych: Mood is stable, no new changes  Extremities: No lower extremity edema All other systems were reviewed with the patient and are negative.  I have reviewed the past medical history, past surgical history, social history and family history with the patient and they are unchanged from previous note.  ALLERGIES:  has No Known Allergies.  MEDICATIONS:  Current Outpatient Prescriptions  Medication Sig Dispense Refill  . ALPRAZolam (XANAX) 0.5 MG tablet Take 0.5 mg by mouth 3 (three) times daily as needed for anxiety.    [name] anastrozole (ARIMIDEX) 1 MG tablet Take 1 tablet (1 mg total) by mouth daily. 90 tablet 3  . aspirin EC 81 MG tablet Take 81 mg by mouth daily.    . bisoprolol-hydrochlorothiazide (ZIAC) 10-6.25 MG tablet TAKE 1 TABLET BY MOUTH DAILY. 90 tablet 3  . Calcium Carbonate-Vitamin D (CALCIUM-D PO) Take 1 tablet by mouth 2 (two) times daily.     . cetirizine (ZYRTEC) 10 MG tablet Take 10 mg by mouth daily.    . clobetasol (OLUX) 0.05 % topical foam Apply 1 application topically daily as needed (psoriasis).   2  . desonide (DESOWEN) 0.05 % cream Apply  1 application topically daily. For psoriasis    . diclofenac sodium (VOLTAREN) 1 % GEL Apply 2 g topically 3 (three) times daily as needed (Pain).     . fluticasone (FLONASE) 50 MCG/ACT nasal spray INHALE 2 SPRAYS IN EACH NOSTRIL DAILY 16 g 11  . ibuprofen (ADVIL,MOTRIN) 200 MG tablet Take 400 mg by mouth every 6 (six) hours as needed for headache (pain).    . Ketotifen Fumarate (ALLERGY EYE DROPS OP)  Place 1 drop into both eyes 2 (two) times daily as needed (allergies).     . losartan (COZAAR) 50 MG tablet TAKE 1 TABLET BY MOUTH ONCE DAILY. 90 tablet 3  . Multiple Vitamin (MULTIVITAMIN WITH MINERALS) TABS tablet Take 1 tablet by mouth daily.    . ondansetron (ZOFRAN) 8 MG tablet Take 1 tablet (8 mg) twice daily as needed for nausea/vomiting (Patient not taking: Reported on 02/27/2016) 60 tablet 2  . tiZANidine (ZANAFLEX) 4 MG tablet Take 4 mg by mouth at bedtime as needed (back pain).   2   No current facility-administered medications for this visit.     PHYSICAL EXAMINATION: ECOG PERFORMANCE STATUS: 1 - Symptomatic but completely ambulatory  Vitals:   05/20/16 1114  BP: 109/69  Pulse: 62  Resp: 17  Temp: 98.2 F (36.8 C)   Filed Weights   05/20/16 1114  Weight: 121 lb 14.4 oz (55.3 kg)    GENERAL:alert, no distress and comfortable SKIN: skin color, texture, turgor are normal, no rashes or significant lesions EYES: normal, Conjunctiva are pink and non-injected, sclera clear OROPHARYNX:no exudate, no erythema and lips, buccal mucosa, and tongue normal  NECK: supple, thyroid normal size, non-tender, without nodularity LYMPH:  no palpable lymphadenopathy in the cervical, axillary or inguinal LUNGS: clear to auscultation and percussion with normal breathing effort HEART: regular rate & rhythm and no murmurs and no lower extremity edema ABDOMEN:abdomen soft, non-tender and normal bowel sounds MUSCULOSKELETAL:no cyanosis of digits and no clubbing  NEURO: alert & oriented x 3 with fluent speech, no focal motor/sensory deficits EXTREMITIES: No lower extremity edema  LABORATORY DATA:  I have reviewed the data as listed   Chemistry      Component Value Date/Time   NA 141 05/20/2016 1052   K 3.8 05/20/2016 1052   CL 104 07/21/2015 1500   CO2 28 05/20/2016 1052   BUN 14.4 05/20/2016 1052   CREATININE 0.8 05/20/2016 1052      Component Value Date/Time   CALCIUM 10.0  05/20/2016 1052   ALKPHOS 69 05/20/2016 1052   AST 26 05/20/2016 1052   ALT 29 05/20/2016 1052   BILITOT 0.56 05/20/2016 1052       Lab Results  Component Value Date   WBC 7.1 05/20/2016   HGB 12.7 05/20/2016   HCT 36.4 05/20/2016   MCV 102.8 (H) 05/20/2016   PLT 248 05/20/2016   NEUTROABS 5.0 05/20/2016    ASSESSMENT & PLAN:  Breast cancer of lower-inner quadrant of left female breast ([facility]) Left breast biopsy 8:30 position: IDC grade 1, ER 90%, PR 0%, 50%, HER-2 Negative Ratio 1.21, Irregular Mass Left Breast 1 Cm from Nipple 2.5 x 2 x 2.5 Cm with Distortion,  T2 N0 Stage II a Clinical Stage Oncotype DX score 52, 34% risk of recurrence without chemotherapy  Treatment Summary: 1. Neoadjuvant chemotherapy with dose dense Adriamycin and Cytoxan 4 followed by Abraxane weekly 12; started 07/30/2015 completed 12/16/2015  2. 1/4/17Left Lumpectomy: IDC grade 1, 0.2 cm, Margins Neg, 0/1 LN Neg; ypT1aN0  3. Adjuvant radiation therapy 02/10/16- 03/23/16  Plan: Adjuvant anastrozole 1 mg daily 5-10 years  Anastrozole toxicities:  1.  mild hot flashes  2. left knee stiffness occasionally  Surveillance: Mammograms will be obtained in June 2018.  Return to clinic in 6 months for follow-up   I spent 25 minutes talking to the patient of which more than half was spent in counseling and coordination of care.  Orders Placed This Encounter  Procedures  . MM DIAG BREAST TOMO BILATERAL    Standing Status:   Future    Standing Expiration Date:   07/20/2017    Order Specific Question:   Reason for Exam (SYMPTOM  OR DIAGNOSIS REQUIRED)    Answer:   Annual mammogram for breast cancer    Order Specific Question:   Preferred imaging location?    Answer:   [facility]   The patient has a good understanding of the overall plan. she agrees with it. she will call with any problems that may develop before the next visit here.   Rulon Eisenmenger, MD 05/20/16

## 2016-06-15 ENCOUNTER — Ambulatory Visit
Admission: RE | Admit: 2016-06-15 | Discharge: 2016-06-15 | Disposition: A | Discharge status: Home / Self Care | Payer: BC Managed Care – PPO | Source: Ambulatory Visit | Attending: Hematology and Oncology | Admitting: Hematology and Oncology

## 2016-06-15 ENCOUNTER — Other Ambulatory Visit: Payer: Self-pay | Admitting: Hematology and Oncology

## 2016-06-15 DIAGNOSIS — Z170 Estrogen receptor positive status [ER+]: Principal | ICD-10-CM

## 2016-06-15 DIAGNOSIS — R2232 Localized swelling, mass and lump, left upper limb: Secondary | ICD-10-CM

## 2016-06-15 DIAGNOSIS — C50312 Malignant neoplasm of lower-inner quadrant of left female breast: Secondary | ICD-10-CM

## 2016-06-15 HISTORY — DX: Personal history of irradiation: Z92.3

## 2016-06-15 HISTORY — DX: Personal history of antineoplastic chemotherapy: Z92.21

## 2016-06-25 ENCOUNTER — Encounter: Payer: Self-pay | Admitting: Family Medicine

## 2016-06-25 ENCOUNTER — Ambulatory Visit (INDEPENDENT_AMBULATORY_CARE_PROVIDER_SITE_OTHER): Payer: BC Managed Care – PPO | Admitting: Family Medicine

## 2016-06-25 VITALS — BP 138/70 | HR 76 | Temp 98.3°F | Resp 16 | Ht 64.0 in | Wt 122.0 lb

## 2016-06-25 DIAGNOSIS — J019 Acute sinusitis, unspecified: Secondary | ICD-10-CM | POA: Diagnosis not present

## 2016-06-25 DIAGNOSIS — B9689 Other specified bacterial agents as the cause of diseases classified elsewhere: Secondary | ICD-10-CM

## 2016-06-25 MED ORDER — PREDNISONE 20 MG PO TABS: ORAL_TABLET | ORAL | 0 refills | Status: DC

## 2016-06-25 MED ORDER — AMOXICILLIN 875 MG PO TABS: 875.0000 mg | ORAL_TABLET | Freq: Two times a day (BID) | ORAL | 0 refills | Status: DC

## 2016-06-25 MED ORDER — HYDROCODONE-HOMATROPINE 5-1.5 MG/5ML PO SYRP: 5.0000 mL | ORAL_SOLUTION | Freq: Three times a day (TID) | ORAL | 0 refills | Status: DC | PRN

## 2016-06-25 NOTE — Progress Notes (Signed)
Subjective:    Patient ID: Kim Ortega, female    DOB: 1955-01-20, 61 y.o.   MRN: [id number]  HPI Symptoms began with a cold 2 weeks ago. Patient now reports head congestion, rhinorrhea, sinus pressure, tenderness to palpation over the right maxillary sinus, postnasal drip, and a constant cough occasionally productive of yellow and green sputum. Denies any chest pain. Denies any shortness of breath. Past Medical History:  Diagnosis Date  . Allergy   . Anxiety    mild no regular meds  . Arthritis    psoriatic arthritis -back  . Breast cancer ([place]) 06/25/15 bx   left breastinvasive ductal ca ,grade 1  . Episcleritis   . Hormone replacement therapy   . Hypertension   . Left carotid artery stenosis    40-59% (01/2015)  . Personal history of chemotherapy 2017   neoadjuvant  . Personal history of radiation therapy 2018  . Psoriasis   . Smoker    Past Surgical History:  Procedure Laterality Date  . ABDOMINAL HYSTERECTOMY    . BREAST LUMPECTOMY WITH RADIOACTIVE SEED AND SENTINEL LYMPH NODE BIOPSY Left 01/08/2016   Procedure: LEFT BREAST LUMPECTOMY WITH RADIOACTIVE SEED AND SENTINEL LYMPH NODE BIOPSY, BLUE DYE INJECTION;  Surgeon: Rolm Bookbinder, MD;  Location: [facility];  Service: General;  Laterality: Left;  . cervical acdf     C5-6- fusion with plates/screws "Dr. Sherwood Gambler" '12  . COLONOSCOPY WITH PROPOFOL N/A 04/16/2014   Procedure: COLONOSCOPY WITH PROPOFOL;  Surgeon: Garlan Fair, MD;  Location: [facility];  Service: Endoscopy;  Laterality: N/A;  . COLPOSCOPY    . laporotomy     Ovarian cyst  . PORT-A-CATH REMOVAL Right 01/08/2016   Procedure: REMOVAL PORT-A-CATH;  Surgeon: Rolm Bookbinder, MD;  Location: [facility];  Service: General;  Laterality: Right;  . PORTACATH PLACEMENT N/A 07/24/2015   Procedure: INSERTION PORT-A-CATH WITH [place];  Surgeon: Rolm Bookbinder, MD;  Location: [facility];  Service: General;  Laterality: N/A;  . TONSILLECTOMY      child   Current Outpatient Prescriptions on File Prior to Visit  Medication Sig Dispense Refill  . ALPRAZolam (XANAX) 0.5 MG tablet Take 0.5 mg by mouth 3 (three) times daily as needed for anxiety.    [name] anastrozole (ARIMIDEX) 1 MG tablet Take 1 tablet (1 mg total) by mouth daily. 90 tablet 3  . aspirin EC 81 MG tablet Take 81 mg by mouth daily.    . bisoprolol-hydrochlorothiazide (ZIAC) 10-6.25 MG tablet TAKE 1 TABLET BY MOUTH DAILY. 90 tablet 3  . Calcium Carbonate-Vitamin D (CALCIUM-D PO) Take 1 tablet by mouth 2 (two) times daily.     . cetirizine (ZYRTEC) 10 MG tablet Take 10 mg by mouth daily.    . clobetasol (OLUX) 0.05 % topical foam Apply 1 application topically daily as needed (psoriasis).   2  . diclofenac sodium (VOLTAREN) 1 % GEL Apply 2 g topically 3 (three) times daily as needed (Pain).     . fluticasone (FLONASE) 50 MCG/ACT nasal spray INHALE 2 SPRAYS IN EACH NOSTRIL DAILY 16 g 11  . ibuprofen (ADVIL,MOTRIN) 200 MG tablet Take 400 mg by mouth every 6 (six) hours as needed for headache (pain).    . Ketotifen Fumarate (ALLERGY EYE DROPS OP) Place 1 drop into both eyes 2 (two) times daily as needed (allergies).     . losartan (COZAAR) 50 MG tablet TAKE 1 TABLET BY MOUTH ONCE DAILY. 90 tablet 3  . Multiple Vitamin (MULTIVITAMIN  WITH MINERALS) TABS tablet Take 1 tablet by mouth daily.    [name] tiZANidine (ZANAFLEX) 4 MG tablet Take 4 mg by mouth at bedtime as needed (back pain).   2   No current facility-administered medications on file prior to visit.    No Known Allergies Social History   Social History  . Marital status: Married    Spouse name: N/A  . Number of children: N/A  . Years of education: N/A   Occupational History  . Not on file.   Social History Main Topics  . Smoking status: Current Every Day Smoker    Packs/day: 0.50    Years: 20.00    Types: Cigarettes  . Smokeless tobacco: Never Used  . Alcohol use 6.0 oz/week    10 Glasses of wine per week      Comment: Glasses of wine several times a week  . Drug use: No  . Sexual activity: Yes    Birth control/ protection: Surgical     Comment: married   Other Topics Concern  . Not on file   Social History Narrative  . No narrative on file      Review of Systems  All other systems reviewed and are negative.      Objective:   Physical Exam  Constitutional: She appears well-developed and well-nourished. No distress.  HENT:  Right Ear: Tympanic membrane, external ear and ear canal normal.  Left Ear: Tympanic membrane, external ear and ear canal normal.  Nose: Mucosal edema and rhinorrhea present. Right sinus exhibits maxillary sinus tenderness. Left sinus exhibits maxillary sinus tenderness.  Mouth/Throat: Oropharynx is clear and moist. No oropharyngeal exudate.  Cardiovascular: Normal rate, regular rhythm and normal heart sounds.   Pulmonary/Chest: Effort normal and breath sounds normal. No respiratory distress. She has no wheezes. She has no rales.  Skin: She is not diaphoretic.  Vitals reviewed.         Assessment & Plan:  Acute bacterial rhinosinusitis - Plan: amoxicillin (AMOXIL) 875 MG tablet, predniSONE (DELTASONE) 20 MG tablet, HYDROcodone-homatropine (HYCODAN) 5-1.5 MG/5ML syrup  Begin amoxicillin 875 mg by mouth twice a day for 10 days for secondary sinusitis. I believe the cough is secondary to postnasal drip. I will use a prednisone taper pack given the swelling and inflammation in the sinuses. Also recommended Hycodan 1 teaspoon every 8 hours as needed for cough

## 2016-07-15 ENCOUNTER — Encounter: Payer: Self-pay | Admitting: Adult Health

## 2016-07-15 ENCOUNTER — Ambulatory Visit (HOSPITAL_BASED_OUTPATIENT_CLINIC_OR_DEPARTMENT_OTHER): Payer: BC Managed Care – PPO | Admitting: Adult Health

## 2016-07-15 VITALS — BP 139/67 | HR 72 | Resp 20 | Ht 64.0 in | Wt 120.8 lb

## 2016-07-15 DIAGNOSIS — N951 Menopausal and female climacteric states: Secondary | ICD-10-CM | POA: Diagnosis not present

## 2016-07-15 DIAGNOSIS — Z170 Estrogen receptor positive status [ER+]: Secondary | ICD-10-CM

## 2016-07-15 DIAGNOSIS — C50312 Malignant neoplasm of lower-inner quadrant of left female breast: Secondary | ICD-10-CM

## 2016-07-15 NOTE — Progress Notes (Signed)
CLINIC:  Survivorship   REASON FOR VISIT:  Routine follow-up post-treatment for a recent history of breast cancer.  BRIEF ONCOLOGIC HISTORY:    Breast cancer of lower-inner quadrant of left female breast ([place])   06/25/2015 Initial Diagnosis    Left breast biopsy 8:30 position: IDC grade 1, ER 90%, PR 0%, 50%, HER-2 Negative Ratio 1.21, Irregular Mass Left Breast 1 Cm from Nipple 2.5 x 2 x 2.5 Cm with Distortion, T2 N0 Stage II a Clinical Stage, Oncotype DX score 52, 34% ROR      07/30/2015 - 12/16/2015 Neo-Adjuvant Chemotherapy    Neoadjuvant chemotherapy with dose dense Adriamycin and Cytoxan 4 followed by Abraxane weekly 12      12/18/2015 Breast MRI    Excellent interval response to chemotherapy with only minimal residual enhancement, 2 tiny rounded regions along superior margin, unchanged, no abnormal lymph nodes       01/08/2016 Surgery    Left Lumpectomy [name]): IDC grade 1, 0.2 cm, Margins Neg, 0/1 LN Neg; [id number]      02/10/2016 - 03/23/2016 Radiation Therapy    Adjuvant radiation      04/04/2016 -  Anti-estrogen oral therapy    Anastrozole 1 mg daily       INTERVAL HISTORY:  Kim Ortega presents to the [facility] today for our initial meeting to review her survivorship care plan detailing her treatment course for breast cancer, as well as monitoring long-term side effects of that treatment, education regarding health maintenance, screening, and overall wellness and health promotion.     Overall, Kim Ortega reports feeling quite well.  She is taking Anastrozole daily and tolerating it for the most part well.  She has some hot flashes, aches and irritability.  She manages this by taking the Anastrozole at night.  She sees her pcp and gyn regularly.      REVIEW OF SYSTEMS:  Review of Systems  Constitutional: Negative for appetite change, chills, diaphoresis, fatigue and fever.  HENT:   Negative for hearing loss and lump/mass.   Eyes: Negative for eye  problems and icterus.  Respiratory: Negative for chest tightness, cough and shortness of breath.   Cardiovascular: Negative for chest pain, leg swelling and palpitations.  Gastrointestinal: Negative for abdominal distention and abdominal pain.  Endocrine: Negative for hot flashes.  Genitourinary: Negative for difficulty urinating.   Skin: Negative for itching and rash.  Neurological: Negative for dizziness.  Hematological: Negative for adenopathy. Does not bruise/bleed easily.  Psychiatric/Behavioral: Negative for depression. The patient is not nervous/anxious.   Breast: Denies any new nodularity, masses, tenderness, nipple changes, or nipple discharge.      ONCOLOGY TREATMENT TEAM:  1. Surgeon:  Dr. [name] at [facility] 2. Medical Oncologist: Dr. Lindi Adie  3. Radiation Oncologist: Dr. Lisbeth Renshaw    PAST MEDICAL/SURGICAL HISTORY:  Past Medical History:  Diagnosis Date  . Allergy   . Anxiety    mild no regular meds  . Arthritis    psoriatic arthritis -back  . Breast cancer ([place]) 06/25/15 bx   left breastinvasive ductal ca ,grade 1  . Episcleritis   . Hormone replacement therapy   . Hypertension   . Left carotid artery stenosis    40-59% (01/2015)  . Personal history of chemotherapy 2017   neoadjuvant  . Personal history of radiation therapy 2018  . Psoriasis   . Smoker    Past Surgical History:  Procedure Laterality Date  . ABDOMINAL HYSTERECTOMY    . BREAST LUMPECTOMY WITH RADIOACTIVE  SEED AND SENTINEL LYMPH NODE BIOPSY Left 01/08/2016   Procedure: LEFT BREAST LUMPECTOMY WITH RADIOACTIVE SEED AND SENTINEL LYMPH NODE BIOPSY, BLUE DYE INJECTION;  Surgeon: Rolm Bookbinder, MD;  Location: [place];  Service: General;  Laterality: Left;  . cervical acdf     C5-6- fusion with plates/screws "Dr. Sherwood Gambler" '12  . COLONOSCOPY WITH PROPOFOL N/A 04/16/2014   Procedure: COLONOSCOPY WITH PROPOFOL;  Surgeon: Garlan Fair, MD;  Location: [facility];   Service: Endoscopy;  Laterality: N/A;  . COLPOSCOPY    . laporotomy     Ovarian cyst  . PORT-A-CATH REMOVAL Right 01/08/2016   Procedure: REMOVAL PORT-A-CATH;  Surgeon: Rolm Bookbinder, MD;  Location: [place];  Service: General;  Laterality: Right;  . PORTACATH PLACEMENT N/A 07/24/2015   Procedure: INSERTION PORT-A-CATH WITH [place];  Surgeon: Rolm Bookbinder, MD;  Location: [place];  Service: General;  Laterality: N/A;  . TONSILLECTOMY     child     ALLERGIES:  No Known Allergies   CURRENT MEDICATIONS:  Outpatient Encounter Prescriptions as of 07/15/2016  Medication Sig  . ALPRAZolam (XANAX) 0.5 MG tablet Take 0.5 mg by mouth 3 (three) times daily as needed for anxiety.  [name] anastrozole (ARIMIDEX) 1 MG tablet Take 1 tablet (1 mg total) by mouth daily.  [name] aspirin EC 81 MG tablet Take 81 mg by mouth daily.  . bisoprolol-hydrochlorothiazide (ZIAC) 10-6.25 MG tablet TAKE 1 TABLET BY MOUTH DAILY.  . Calcium Carbonate-Vitamin D (CALCIUM-D PO) Take 1 tablet by mouth 2 (two) times daily.   . cetirizine (ZYRTEC) 10 MG tablet Take 10 mg by mouth daily.  . clobetasol (OLUX) 0.05 % topical foam Apply 1 application topically daily as needed (psoriasis).   [name] diclofenac sodium (VOLTAREN) 1 % GEL Apply 2 g topically 3 (three) times daily as needed (Pain).   . fluticasone (FLONASE) 50 MCG/ACT nasal spray INHALE 2 SPRAYS IN EACH NOSTRIL DAILY  . ibuprofen (ADVIL,MOTRIN) 200 MG tablet Take 400 mg by mouth every 6 (six) hours as needed for headache (pain).  . Ketotifen Fumarate (ALLERGY EYE DROPS OP) Place 1 drop into both eyes 2 (two) times daily as needed (allergies).   . losartan (COZAAR) 50 MG tablet TAKE 1 TABLET BY MOUTH ONCE DAILY.  . Multiple Vitamin (MULTIVITAMIN WITH MINERALS) TABS tablet Take 1 tablet by mouth daily.  [name] tiZANidine (ZANAFLEX) 4 MG tablet Take 4 mg by mouth at bedtime as needed (back pain).   . [DISCONTINUED] amoxicillin (AMOXIL) 875 MG tablet Take 1 tablet (875 mg  total) by mouth 2 (two) times daily.  . [DISCONTINUED] HYDROcodone-homatropine (HYCODAN) 5-1.5 MG/5ML syrup Take 5 mLs by mouth every 8 (eight) hours as needed for cough.  . [DISCONTINUED] predniSONE (DELTASONE) 20 MG tablet 3 tabs poqday 1-2, 2 tabs poqday 3-4, 1 tab poqday 5-6   No facility-administered encounter medications on file as of 07/15/2016.      ONCOLOGIC FAMILY HISTORY:  Family History  Problem Relation Age of Onset  . Stroke Mother     SOCIAL HISTORY:  Kim Ortega is married and lives in [place], [place].  Kim Ortega is currently retired.  She denies any current or history of tobacco, alcohol, or illicit drug use.     PHYSICAL EXAMINATION:  Vital Signs:   Vitals:   07/15/16 0933  BP: 139/67  Pulse: 72  Resp: 20   Filed Weights   07/15/16 0933  Weight: 120 lb 12.8 oz (54.8 kg)   General:  Well-nourished, well-appearing female in no acute distress.  She is unaccompanied today.   HEENT: Head is normocephalic.  Pupils equal and reactive to light. Conjunctivae clear without exudate.  Sclerae anicteric. Oral mucosa is pink, moist.  Oropharynx is pink without lesions or erythema.  Lymph: No cervical, supraclavicular, or infraclavicular lymphadenopathy noted on palpation.  Cardiovascular: Regular rate and rhythm.[name] Respiratory: Clear to auscultation bilaterally. Chest expansion symmetric; breathing non-labored.  GI: Abdomen soft and round; non-tender, non-distended. Bowel sounds normoactive.  GU: Deferred.  Neuro: No focal deficits. Steady gait.  Psych: Mood and affect normal and appropriate for situation.  Extremities: No edema. MSK: No focal spinal tenderness to palpation.  Full range of motion in bilateral upper extremities Skin: Warm and dry.  LABORATORY DATA:  None for this visit.  DIAGNOSTIC IMAGING:  None for this visit.      ASSESSMENT AND PLAN:  Ms.. Ortega is a pleasant 61 y.o. 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  She presents to the Survivorship Clinic for our initial meeting and routine follow-up post-completion of treatment for breast cancer.    1. Stage II left breast cancer:  Kim Ortega is continuing to recover from definitive treatment for breast cancer. She will follow-up with her medical oncologist, Dr. Lindi Adie in 11/2016 with history and physical exam per surveillance protocol.  She will continue her anti-estrogen therapy with Anastrozole. Thus far, she is tolerating the Anastrozole well, with minimal side effects. She was instructed to make Dr. Lindi Adie or myself aware if she begins to experience any worsening side effects of the medication and I could see her back in clinic to help manage those side effects, as needed.Today, a comprehensive survivorship care plan and treatment summary was reviewed with the patient today detailing her breast cancer diagnosis, treatment course, potential late/long-term effects of treatment, appropriate follow-up care with recommendations for the future, and patient education resources.  A copy of this summary, along with a letter will be sent to the patient's primary care provider via mail/fax/In Basket message after today's visit.    2. Bone health:  Given Kim Ortega's age/history of breast cancer and her current treatment regimen including anti-estrogen therapy with Anastrozole, she is at risk for bone demineralization. She is going to undergo bone density in August, 2018 with her gynecologist.  In the meantime, she was encouraged to increase her consumption of foods rich in calcium, as well as increase her weight-bearing activities.  She was given education on specific activities to promote bone health.  3. Cancer screening:  Due to Kim Ortega's history and her age, she should receive screening for skin  cancers, colon cancer, and gynecologic cancers.  The information and recommendations are listed on the patient's comprehensive care plan/treatment summary and were reviewed in detail with the patient.    4. Health maintenance and wellness promotion: Kim Ortega was encouraged to consume 5-7 servings of fruits and vegetables per day. We reviewed the "Nutrition [employer]" handout, as well as the handout "Take Control of Your Health and Reduce Your Cancer Risk" from the [employer].  She was also encouraged to engage in moderate to vigorous exercise for 30 minutes per day most days of the week. We discussed the [employer] fitness program, which is designed for cancer survivors to help them become more physically fit after cancer treatments.  She was instructed to limit her alcohol  consumption and continue to abstain from tobacco use.     5. Support services/counseling: It is not uncommon for this period of the patient's cancer care trajectory to be one of many emotions and stressors.  We discussed an opportunity for her to participate in the next session of [facility] ("Finding Your New Normal") support group series designed for patients after they have completed treatment.   Kim Ortega was encouraged to take advantage of our many other support services programs, support groups, and/or counseling in coping with her new life as a cancer survivor after completing anti-cancer treatment.  She was offered support today through active listening and expressive supportive counseling.  She was given information regarding our available services and encouraged to contact me with any questions or for help enrolling in any of our support group/programs.    Dispo:   -Return to cancer center for follow up with Dr. Lindi Adie in 11/2016  -Mammogram due in 06/2017 -Follow up with surgery 01/2017 -She is welcome to return back to the Survivorship Clinic at any time; no additional follow-up needed at this time.    -Consider referral back to survivorship as a long-term survivor for continued surveillance  A total of (30) minutes of face-to-face time was spent with this patient with greater than 50% of that time in counseling and care-coordination.   Gardenia Phlegm, NP Survivorship Program [facility] [phone]   Note: PRIMARY CARE PROVIDER [name], [place] [phone]

## 2016-07-28 ENCOUNTER — Telehealth: Payer: Self-pay | Admitting: Family Medicine

## 2016-07-28 DIAGNOSIS — I6522 Occlusion and stenosis of left carotid artery: Secondary | ICD-10-CM

## 2016-07-28 NOTE — Telephone Encounter (Signed)
Patient left message saying it is time for her to have her doppler referral for her left carotid artery  Can referral be put in

## 2016-07-28 NOTE — Telephone Encounter (Signed)
Order placed

## 2016-08-24 ENCOUNTER — Encounter: Payer: Self-pay | Admitting: Family Medicine

## 2016-08-26 ENCOUNTER — Other Ambulatory Visit: Payer: Self-pay | Admitting: Family Medicine

## 2016-09-08 ENCOUNTER — Encounter (HOSPITAL_COMMUNITY): Payer: BC Managed Care – PPO

## 2016-09-09 ENCOUNTER — Ambulatory Visit (HOSPITAL_COMMUNITY)
Admission: RE | Admit: 2016-09-09 | Discharge: 2016-09-09 | Disposition: A | Discharge status: Home / Self Care | Payer: BC Managed Care – PPO | Source: Ambulatory Visit | Attending: Internal Medicine | Admitting: Internal Medicine

## 2016-09-09 DIAGNOSIS — I6522 Occlusion and stenosis of left carotid artery: Secondary | ICD-10-CM | POA: Diagnosis present

## 2016-09-09 DIAGNOSIS — I6523 Occlusion and stenosis of bilateral carotid arteries: Secondary | ICD-10-CM | POA: Diagnosis not present

## 2016-09-14 ENCOUNTER — Encounter: Payer: Self-pay | Admitting: Family Medicine

## 2016-09-20 ENCOUNTER — Encounter: Payer: Self-pay | Admitting: Family Medicine

## 2016-10-25 ENCOUNTER — Encounter: Payer: Self-pay | Admitting: Family Medicine

## 2016-10-25 ENCOUNTER — Ambulatory Visit (INDEPENDENT_AMBULATORY_CARE_PROVIDER_SITE_OTHER): Payer: BC Managed Care – PPO | Admitting: Family Medicine

## 2016-10-25 VITALS — BP 110/68 | HR 78 | Temp 97.2°F | Resp 12 | Ht 64.0 in | Wt 121.0 lb

## 2016-10-25 DIAGNOSIS — Z23 Encounter for immunization: Secondary | ICD-10-CM | POA: Diagnosis not present

## 2016-10-25 DIAGNOSIS — F17200 Nicotine dependence, unspecified, uncomplicated: Secondary | ICD-10-CM | POA: Diagnosis not present

## 2016-10-25 DIAGNOSIS — I6522 Occlusion and stenosis of left carotid artery: Secondary | ICD-10-CM | POA: Diagnosis not present

## 2016-10-25 DIAGNOSIS — C50312 Malignant neoplasm of lower-inner quadrant of left female breast: Secondary | ICD-10-CM | POA: Diagnosis not present

## 2016-10-25 DIAGNOSIS — Z170 Estrogen receptor positive status [ER+]: Secondary | ICD-10-CM | POA: Diagnosis not present

## 2016-10-25 DIAGNOSIS — Z0000 Encounter for general adult medical examination without abnormal findings: Secondary | ICD-10-CM | POA: Diagnosis not present

## 2016-10-25 MED ORDER — ATORVASTATIN CALCIUM 20 MG PO TABS: 20.0000 mg | ORAL_TABLET | Freq: Every day | ORAL | 3 refills | Status: DC

## 2016-10-25 NOTE — Progress Notes (Signed)
Subjective:    Patient ID: Kim Ortega, female    DOB: 07-07-1955, 61 y.o.   MRN: [id number]  HPI Patient is a very pleasant 61 year old white female who is here today for complete physical exam. She has a hysterectomy but still sees her gynecologist on an annual basis for Pap smears. She has a history of grade 1 invasive ductal carcinoma of the left breast that was successfully treated and she is currently on arimidex.  She is scheduled to see her gynecologist later this year for her Pap smear and her bone density.  Her colonoscopy was in 2016 and is up-to-date.  Recently had carotid Dopplers which revealed a stable 40-59% left internal carotid artery stenosis which is stable she is taking an aspirin but she continues to smoke and she is not on a statin  Past Medical History:  Diagnosis Date  . Allergy   . Anxiety    mild no regular meds  . Arthritis    psoriatic arthritis -back  . Breast cancer ([place]) 06/25/15 bx   left breastinvasive ductal ca ,grade 1  . Episcleritis   . Hormone replacement therapy   . Hypertension   . Left carotid artery stenosis    40-59% (01/2015)  . Personal history of chemotherapy 2017   neoadjuvant  . Personal history of radiation therapy 2018  . Psoriasis   . Smoker    Past Surgical History:  Procedure Laterality Date  . ABDOMINAL HYSTERECTOMY    . BREAST LUMPECTOMY WITH RADIOACTIVE SEED AND SENTINEL LYMPH NODE BIOPSY Left 01/08/2016   Procedure: LEFT BREAST LUMPECTOMY WITH RADIOACTIVE SEED AND SENTINEL LYMPH NODE BIOPSY, BLUE DYE INJECTION;  Surgeon: Rolm Bookbinder, MD;  Location: [place];  Service: General;  Laterality: Left;  . cervical acdf     C5-6- fusion with plates/screws "Dr. Sherwood Gambler" '12  . COLONOSCOPY WITH PROPOFOL N/A 04/16/2014   Procedure: COLONOSCOPY WITH PROPOFOL;  Surgeon: Garlan Fair, MD;  [facility];  Service: Endoscopy;  Laterality: N/A;  . COLPOSCOPY    . laporotomy     Ovarian cyst  .  PORT-A-CATH REMOVAL Right 01/08/2016   Procedure: REMOVAL PORT-A-CATH;  Surgeon: Rolm Bookbinder, MD;  Location: [place];  Service: General;  Laterality: Right;  . PORTACATH PLACEMENT N/A 07/24/2015   Procedure: INSERTION PORT-A-CATH WITH [place];  Surgeon: Rolm Bookbinder, MD;  Location: [place];  Service: General;  Laterality: N/A;  . TONSILLECTOMY     child   Current Outpatient Prescriptions on File Prior to Visit  Medication Sig Dispense Refill  . ALPRAZolam (XANAX) 0.5 MG tablet Take 0.5 mg by mouth 3 (three) times daily as needed for anxiety.    [name] anastrozole (ARIMIDEX) 1 MG tablet Take 1 tablet (1 mg total) by mouth daily. 90 tablet 3  . aspirin EC 81 MG tablet Take 81 mg by mouth daily.    . bisoprolol-hydrochlorothiazide (ZIAC) 10-6.25 MG tablet TAKE 1 TABLET BY MOUTH DAILY. 90 tablet 3  . Calcium Carbonate-Vitamin D (CALCIUM-D PO) Take 1 tablet by mouth 2 (two) times daily.     . cetirizine (ZYRTEC) 10 MG tablet Take 10 mg by mouth daily.    . clobetasol (OLUX) 0.05 % topical foam Apply 1 application topically daily as needed (psoriasis).   2  . diclofenac sodium (VOLTAREN) 1 % GEL Apply 2 g topically 3 (three) times daily as needed (Pain).     . fluticasone (FLONASE) 50 MCG/ACT nasal spray SPRAY TWICE INTO EACH NOSTRIL  EVERY DAY 16 g 11  . ibuprofen (ADVIL,MOTRIN) 200 MG tablet Take 400 mg by mouth every 6 (six) hours as needed for headache (pain).    . Ketotifen Fumarate (ALLERGY EYE DROPS OP) Place 1 drop into both eyes 2 (two) times daily as needed (allergies).     . losartan (COZAAR) 50 MG tablet TAKE 1 TABLET BY MOUTH ONCE DAILY. 90 tablet 3  . Multiple Vitamin (MULTIVITAMIN WITH MINERALS) TABS tablet Take 1 tablet by mouth daily.    [name] tiZANidine (ZANAFLEX) 4 MG tablet Take 4 mg by mouth at bedtime as needed (back pain).   2   No current facility-administered medications on file prior to visit.    No Known Allergies Social History   Social History  . Marital  status: Married    Spouse name: N/A  . Number of children: N/A  . Years of education: N/A   Occupational History  . Not on file.   Social History Main Topics  . Smoking status: Current Every Day Smoker    Packs/day: 0.50    Years: 20.00    Types: Cigarettes  . Smokeless tobacco: Never Used  . Alcohol use 6.0 oz/week    10 Glasses of wine per week     Comment: Glasses of wine several times a week  . Drug use: No  . Sexual activity: Yes    Birth control/ protection: Surgical     Comment: married   Other Topics Concern  . Not on file   Social History Narrative  . No narrative on file   Family History  Problem Relation Age of Onset  . Stroke Mother       Review of Systems  All other systems reviewed and are negative.      Objective:   Physical Exam  Constitutional: She is oriented to person, place, and time. She appears well-developed and well-nourished. No distress.  HENT:  Head: Normocephalic and atraumatic.  Right Ear: External ear normal.  Left Ear: External ear normal.  Nose: Nose normal.  Mouth/Throat: Oropharynx is clear and moist. No oropharyngeal exudate.  Eyes: Pupils are equal, round, and reactive to light. Conjunctivae and EOM are normal. Right eye exhibits no discharge. Left eye exhibits no discharge. No scleral icterus.  Neck: Normal range of motion. Neck supple. No JVD present. No tracheal deviation present. No thyromegaly present.  Cardiovascular: Normal rate, regular rhythm, normal heart sounds and intact distal pulses.  Exam reveals no gallop and no friction rub.   No murmur heard. Pulmonary/Chest: Effort normal and breath sounds normal. No stridor. No respiratory distress. She has no wheezes. She has no rales. She exhibits no tenderness.  Abdominal: Soft. Bowel sounds are normal. She exhibits no distension and no mass. There is no tenderness. There is no rebound and no guarding.  Musculoskeletal: Normal range of motion.  Lymphadenopathy:    She  has no cervical adenopathy.  Neurological: She is alert and oriented to person, place, and time. She has normal reflexes. No cranial nerve deficit. She exhibits normal muscle tone. Coordination normal.  Skin: No rash noted. She is not diaphoretic.  Psychiatric: She has a normal mood and affect. Her behavior is normal. Judgment and thought content normal.  Vitals reviewed.         Assessment & Plan:  General medical exam  Left carotid artery stenosis  Malignant neoplasm of lower-inner quadrant of left breast in female, estrogen receptor positive ([place])  Smoker  Patient's physical exam is normal.  I reviewed all of her lab work for the last year.  Her 10-year risk of coronary artery disease is less than 6% based on her cholesterol.  However she is a smoker with known carotid artery disease.  Therefore she requires a statin to retard progression.  I recommended Lipitor 20 mg a day and I would gradually titrate the dose to the highest dose she could tolerate.  Recheck a carotid Doppler next year.  Continue to encourage smoking cessation.  She received her flu shot today along with Pneumovax 23

## 2016-11-17 ENCOUNTER — Ambulatory Visit: Payer: BC Managed Care – PPO | Admitting: Physician Assistant

## 2016-11-17 ENCOUNTER — Encounter: Payer: Self-pay | Admitting: Physician Assistant

## 2016-11-17 ENCOUNTER — Other Ambulatory Visit: Payer: Self-pay

## 2016-11-17 VITALS — BP 118/78 | HR 77 | Temp 98.0°F | Resp 14 | Wt 121.2 lb

## 2016-11-17 DIAGNOSIS — J988 Other specified respiratory disorders: Secondary | ICD-10-CM

## 2016-11-17 DIAGNOSIS — B9689 Other specified bacterial agents as the cause of diseases classified elsewhere: Secondary | ICD-10-CM

## 2016-11-17 MED ORDER — AZITHROMYCIN 250 MG PO TABS: ORAL_TABLET | ORAL | 0 refills | Status: AC

## 2016-11-17 MED ORDER — HYDROCODONE-HOMATROPINE 5-1.5 MG/5ML PO SYRP: 5.0000 mL | ORAL_SOLUTION | Freq: Four times a day (QID) | ORAL | 0 refills | Status: DC | PRN

## 2016-11-17 NOTE — Progress Notes (Signed)
Patient ID: Kim Ortega MRN: [id number], DOB: 1955-07-06, 61 y.o. Date of Encounter: 11/17/2016, 2:39 PM    Chief Complaint:  Chief Complaint  Patient presents with  . URI  . Nasal Congestion     HPI: 61 y.o. year old female presents with above.   She has been having nasal congestion, mucus from the nose, scratchy throat, chest congestion and cough.  Symptoms have been going on for almost a full week and are getting worse.  Has had no fevers or chills.  Says that her husband has the same symptoms and is on azithromycin antibiotic.  No other concerns to address today.  She is coughing some through visit.  Says that the cough is keeping her awake at night and would like a cough suppressant.     Home Meds:   Outpatient Medications Prior to Visit  Medication Sig Dispense Refill  . ALPRAZolam (XANAX) 0.5 MG tablet Take 0.5 mg by mouth 3 (three) times daily as needed for anxiety.    [name] anastrozole (ARIMIDEX) 1 MG tablet Take 1 tablet (1 mg total) by mouth daily. 90 tablet 3  . aspirin EC 81 MG tablet Take 81 mg by mouth daily.    [name] atorvastatin (LIPITOR) 20 MG tablet Take 1 tablet (20 mg total) by mouth daily. 90 tablet 3  . bisoprolol-hydrochlorothiazide (ZIAC) 10-6.25 MG tablet TAKE 1 TABLET BY MOUTH DAILY. 90 tablet 3  . Calcium Carbonate-Vitamin D (CALCIUM-D PO) Take 1 tablet by mouth 2 (two) times daily.     . cetirizine (ZYRTEC) 10 MG tablet Take 10 mg by mouth daily.    . clobetasol (OLUX) 0.05 % topical foam Apply 1 application topically daily as needed (psoriasis).   2  . diclofenac sodium (VOLTAREN) 1 % GEL Apply 2 g topically 3 (three) times daily as needed (Pain).     . fluticasone (FLONASE) 50 MCG/ACT nasal spray SPRAY TWICE INTO EACH NOSTRIL EVERY DAY 16 g 11  . ibuprofen (ADVIL,MOTRIN) 200 MG tablet Take 400 mg by mouth every 6 (six) hours as needed for headache (pain).    . Ketotifen Fumarate (ALLERGY EYE DROPS OP) Place 1 drop into both eyes 2 (two) times daily as  needed (allergies).     . losartan (COZAAR) 50 MG tablet TAKE 1 TABLET BY MOUTH ONCE DAILY. 90 tablet 3  . Multiple Vitamin (MULTIVITAMIN WITH MINERALS) TABS tablet Take 1 tablet by mouth daily.    [name] tiZANidine (ZANAFLEX) 4 MG tablet Take 4 mg by mouth at bedtime as needed (back pain).   2   No facility-administered medications prior to visit.     Allergies: No Known Allergies    Review of Systems: See HPI for pertinent ROS. All other ROS negative.    Physical Exam: Blood pressure 118/78, pulse 77, temperature 98 F (36.7 C), temperature source Oral, resp. rate 14, weight 55 kg (121 lb 3.2 oz), SpO2 97 %., Body mass index is 20.8 kg/m. General: WNWD WF.  Appears in no acute distress. HEENT: Normocephalic, atraumatic, eyes without discharge, sclera non-icteric, nares are without discharge. Bilateral auditory canals clear, TM's are without perforation, pearly grey and translucent with reflective cone of light bilaterally. Oral cavity moist, posterior pharynx with mild erythema, no exudate. Neck: Supple. No thyromegaly. No lymphadenopathy. Lungs: Clear bilaterally to auscultation without wheezes, rales, or rhonchi. Breathing is unlabored. Heart: Regular rhythm. No murmurs, rubs, or gallops. Msk:  Strength and tone normal for age. Extremities/Skin: Warm and dry. Neuro: Alert and  oriented X 3. Moves all extremities spontaneously. Gait is normal. CNII-XII grossly in tact. Psych:  Responds to questions appropriately with a normal affect.     ASSESSMENT AND PLAN:  61 y.o. year old female with  1. Bacterial respiratory infection Take antibiotic as directed.  Can use the Hycodan as needed for cough suppressant.  Follow-up if symptoms do not resolve within 1 week after completion of antibiotic. - azithromycin (ZITHROMAX Z-PAK) 250 MG tablet; Take 2 tablets (500 mg) on  Day 1,  followed by 1 tablet (250 mg) once daily on Days 2 through 5.  Dispense: 6 each; Refill: 0 - HYDROcodone-homatropine  (HYCODAN) 5-1.5 MG/5ML syrup; Take 5 mLs every 6 (six) hours as needed by mouth for cough.  Dispense: 120 mL; Refill: 0   Signed, [address] [place], [place], [facility] 11/17/2016 2:39 PM

## 2016-11-19 ENCOUNTER — Ambulatory Visit (HOSPITAL_BASED_OUTPATIENT_CLINIC_OR_DEPARTMENT_OTHER): Payer: BC Managed Care – PPO | Admitting: Hematology and Oncology

## 2016-11-19 DIAGNOSIS — C50312 Malignant neoplasm of lower-inner quadrant of left female breast: Secondary | ICD-10-CM

## 2016-11-19 DIAGNOSIS — Z170 Estrogen receptor positive status [ER+]: Secondary | ICD-10-CM | POA: Diagnosis not present

## 2016-11-19 DIAGNOSIS — M7918 Myalgia, other site: Secondary | ICD-10-CM

## 2016-11-19 DIAGNOSIS — R454 Irritability and anger: Secondary | ICD-10-CM

## 2016-11-19 DIAGNOSIS — N951 Menopausal and female climacteric states: Secondary | ICD-10-CM

## 2016-11-19 MED ORDER — LETROZOLE 2.5 MG PO TABS: 2.5000 mg | ORAL_TABLET | Freq: Every day | ORAL | 3 refills | Status: DC

## 2016-11-19 NOTE — Progress Notes (Signed)
Patient Care Team: [name], MD as PCP - General (Family Medicine) [name], MD as Consulting Physician (Hematology and Oncology) [name], [name], NP as Nurse Practitioner (Hematology and Oncology) Kyung Rudd, MD as Consulting Physician (Radiation Oncology) Rolm Bookbinder, MD as Consulting Physician (General Surgery)  DIAGNOSIS:  Encounter Diagnosis  Name Primary?  . Malignant neoplasm of lower-inner quadrant of left breast in female, estrogen receptor positive ([place])     SUMMARY OF ONCOLOGIC HISTORY:   Breast cancer of lower-inner quadrant of left female breast ([place])   06/25/2015 Initial Diagnosis    Left breast biopsy 8:30 position: IDC grade 1, ER 90%, PR 0%, 50%, HER-2 Negative Ratio 1.21, Irregular Mass Left Breast 1 Cm from Nipple 2.5 x 2 x 2.5 Cm with Distortion, T2 N0 Stage II a Clinical Stage, Oncotype DX score 52, 34% ROR      07/30/2015 - 12/16/2015 Neo-Adjuvant Chemotherapy    Neoadjuvant chemotherapy with dose dense Adriamycin and Cytoxan 4 followed by Abraxane weekly 12      12/18/2015 Breast MRI    Excellent interval response to chemotherapy with only minimal residual enhancement, 2 tiny rounded regions along superior margin, unchanged, no abnormal lymph nodes       01/08/2016 Surgery    Left Lumpectomy [name]): IDC grade 1, 0.2 cm, Margins Neg, 0/1 LN Neg; ypT1aN0      02/10/2016 - 03/23/2016 Radiation Therapy    Adjuvant radiation      04/04/2016 -  Anti-estrogen oral therapy    Anastrozole 1 mg daily switched to letrozole 01/04/2017       CHIEF COMPLIANT: Follow-up on anastrozole therapy  INTERVAL HISTORY: Kim Ortega is a 61 year old with above-mentioned history of left breast cancer treated with lumpectomy radiation is currently on anastrozole.  She has been experiencing lots of musculoskeletal aches and pains.  Her husband also reports that she has become very irritable.  She is also experiencing hot flashes that are  profound.  Her husband has been complaining about this even more.  REVIEW OF SYSTEMS:   Constitutional: Denies fevers, chills or abnormal weight loss Eyes: Denies blurriness of vision Ears, nose, mouth, throat, and face: Denies mucositis or sore throat Respiratory: Denies cough, dyspnea or wheezes Cardiovascular: Denies palpitation, chest discomfort Gastrointestinal:  Denies nausea, heartburn or change in bowel habits Skin: Denies abnormal skin rashes Lymphatics: Denies new lymphadenopathy or easy bruising Neurological:Denies numbness, tingling or new weaknesses Behavioral/Psych: Mood is stable, no new changes  Extremities: No lower extremity edema Breast:  denies any pain or lumps or nodules in either breasts All other systems were reviewed with the patient and are negative.  I have reviewed the past medical history, past surgical history, social history and family history with the patient and they are unchanged from previous note.  ALLERGIES:  has No Known Allergies.  MEDICATIONS:  Current Outpatient Medications  Medication Sig Dispense Refill  . ALPRAZolam (XANAX) 0.5 MG tablet Take 0.5 mg by mouth 3 (three) times daily as needed for anxiety.    [name] aspirin EC 81 MG tablet Take 81 mg by mouth daily.    [name] atorvastatin (LIPITOR) 20 MG tablet Take 1 tablet (20 mg total) by mouth daily. 90 tablet 3  . azithromycin (ZITHROMAX Z-PAK) 250 MG tablet Take 2 tablets (500 mg) on  Day 1,  followed by 1 tablet (250 mg) once daily on Days 2 through 5. 6 each 0  . bisoprolol-hydrochlorothiazide (ZIAC) 10-6.25 MG tablet TAKE 1 TABLET BY MOUTH DAILY.  90 tablet 3  . Calcium Carbonate-Vitamin D (CALCIUM-D PO) Take 1 tablet by mouth 2 (two) times daily.     . cetirizine (ZYRTEC) 10 MG tablet Take 10 mg by mouth daily.    . clobetasol (OLUX) 0.05 % topical foam Apply 1 application topically daily as needed (psoriasis).   2  . diclofenac sodium (VOLTAREN) 1 % GEL Apply 2 g topically 3 (three) times daily  as needed (Pain).     . fluticasone (FLONASE) 50 MCG/ACT nasal spray SPRAY TWICE INTO EACH NOSTRIL EVERY DAY 16 g 11  . HYDROcodone-homatropine (HYCODAN) 5-1.5 MG/5ML syrup Take 5 mLs every 6 (six) hours as needed by mouth for cough. 120 mL 0  . ibuprofen (ADVIL,MOTRIN) 200 MG tablet Take 400 mg by mouth every 6 (six) hours as needed for headache (pain).    . Ketotifen Fumarate (ALLERGY EYE DROPS OP) Place 1 drop into both eyes 2 (two) times daily as needed (allergies).     [name] letrozole (FEMARA) 2.5 MG tablet Take 1 tablet (2.5 mg total) daily by mouth. 90 tablet 3  . losartan (COZAAR) 50 MG tablet TAKE 1 TABLET BY MOUTH ONCE DAILY. 90 tablet 3  . Multiple Vitamin (MULTIVITAMIN WITH MINERALS) TABS tablet Take 1 tablet by mouth daily.    [name] tiZANidine (ZANAFLEX) 4 MG tablet Take 4 mg by mouth at bedtime as needed (back pain).   2   No current facility-administered medications for this visit.     PHYSICAL EXAMINATION: ECOG PERFORMANCE STATUS: 1 - Symptomatic but completely ambulatory  Vitals:   11/19/16 1059  BP: (!) 151/77  Pulse: 69  Resp: 16  Temp: 98.1 F (36.7 C)  SpO2: 98%   Filed Weights   11/19/16 1059  Weight: 121 lb 11.2 oz (55.2 kg)    GENERAL:alert, no distress and comfortable SKIN: skin color, texture, turgor are normal, no rashes or significant lesions EYES: normal, Conjunctiva are pink and non-injected, sclera clear OROPHARYNX:no exudate, no erythema and lips, buccal mucosa, and tongue normal  NECK: supple, thyroid normal size, non-tender, without nodularity LYMPH:  no palpable lymphadenopathy in the cervical, axillary or inguinal LUNGS: clear to auscultation and percussion with normal breathing effort HEART: regular rate & rhythm and no murmurs and no lower extremity edema ABDOMEN:abdomen soft, non-tender and normal bowel sounds MUSCULOSKELETAL:no cyanosis of digits and no clubbing  NEURO: alert & oriented x 3 with fluent speech, no focal motor/sensory  deficits EXTREMITIES: No lower extremity edema BREAST: No palpable masses or nodules in either right or left breasts. No palpable axillary supraclavicular or infraclavicular adenopathy no breast tenderness or nipple discharge. (exam performed in the presence of a chaperone)  LABORATORY DATA:  I have reviewed the data as listed   Chemistry      Component Value Date/Time   NA 141 05/20/2016 1052   K 3.8 05/20/2016 1052   CL 104 07/21/2015 1500   CO2 28 05/20/2016 1052   BUN 14.4 05/20/2016 1052   CREATININE 0.8 05/20/2016 1052      Component Value Date/Time   CALCIUM 10.0 05/20/2016 1052   ALKPHOS 69 05/20/2016 1052   AST 26 05/20/2016 1052   ALT 29 05/20/2016 1052   BILITOT 0.56 05/20/2016 1052       Lab Results  Component Value Date   WBC 7.1 05/20/2016   HGB 12.7 05/20/2016   HCT 36.4 05/20/2016   MCV 102.8 (H) 05/20/2016   PLT 248 05/20/2016   NEUTROABS 5.0 05/20/2016    ASSESSMENT &  PLAN:  Breast cancer of lower-inner quadrant of left female breast ([place]) Left breast biopsy 8:30 position: IDC grade 1, ER 90%, PR 0%, 50%, HER-2 Negative Ratio 1.21, Irregular Mass Left Breast 1 Cm from Nipple 2.5 x 2 x 2.5 Cm with Distortion,  T2 N0 Stage II a Clinical Stage Oncotype DX score 52, 34% risk of recurrence without chemotherapy  Treatment Summary: 1. Neoadjuvant chemotherapy with dose dense Adriamycin and Cytoxan 4 followed by Abraxane weekly 12; started 07/30/2015 completed 12/16/2015  2. 1/4/17Left Lumpectomy: IDC grade 1, 0.2 cm, Margins Neg, 0/1 LN Neg; ypT1aN0 3. Adjuvant radiation therapy 02/10/16- 03/23/16  Plan: Adjuvant anastrozole 1 mg daily 5-10 years  Anastrozole toxicities:  Complains of being irritable Diffuse musculoskeletal aches and pains Moderate hot flashes  Because of the symptoms I instructed her to switch from anastrozole to letrozole.  I gave her a new prescription for this.  She will inform us if she has any further problems.  She would  like to wait until January 1 before she would start letrozole.  Return to clinic in 6 months for follow-up   I spent 25 minutes talking to the patient of which more than half was spent in counseling and coordination of care.  No orders of the defined types were placed in this encounter.  The patient has a good understanding of the overall plan. she agrees with it. she will call with any problems that may develop before the next visit here.   Rulon Eisenmenger, MD 11/19/16

## 2016-11-19 NOTE — Assessment & Plan Note (Signed)
Left breast biopsy 8:30 position: IDC grade 1, ER 90%, PR 0%, 50%, HER-2 Negative Ratio 1.21, Irregular Mass Left Breast 1 Cm from Nipple 2.5 x 2 x 2.5 Cm with Distortion,  T2 N0 Stage II a Clinical Stage Oncotype DX score 52, 34% risk of recurrence without chemotherapy  Treatment Summary: 1. Neoadjuvant chemotherapy with dose dense Adriamycin and Cytoxan 4 followed by Abraxane weekly 12; started 07/30/2015 completed 12/16/2015  2. 1/4/17Left Lumpectomy: IDC grade 1, 0.2 cm, Margins Neg, 0/1 LN Neg; ypT1aN0 3. Adjuvant radiation therapy 02/10/16- 03/23/16  Plan: Adjuvant anastrozole 1 mg daily 5-10 years  Anastrozole toxicities:  Complains of being irritable Diffuse musculoskeletal aches and pains Moderate hot flashes  Because of the symptoms I instructed her to switch from anastrozole to letrozole.  I gave her a new prescription for this.  She will inform us if she has any further problems.  She would like to wait until January 1 before she would start letrozole.  Return to clinic in 6 months for follow-up

## 2017-01-22 IMAGING — MR MR BILATERAL BREAST WITHOUT AND WITH CONTRAST
8 of 12 series · 32 of 48 positions shown · IV contrast (11 ml Multihance)
Comparison: Previous exam(s).

CLINICAL DATA: Known malignancy in the left breast. Follow-up after
chemotherapy.

LABS:  None available
EXAM:
BILATERAL BREAST MRI WITH AND WITHOUT CONTRAST
TECHNIQUE: Multiplanar, multisequence MR images of both breasts were obtained
prior to and following the intravenous administration of 11 ml of
MultiHance.

[Series 2: t2_tirm_tra ipat (a-p) · axial · 3.0mm · 0.68mm/px · 1 of 55 slices shown]
[im 1/55]
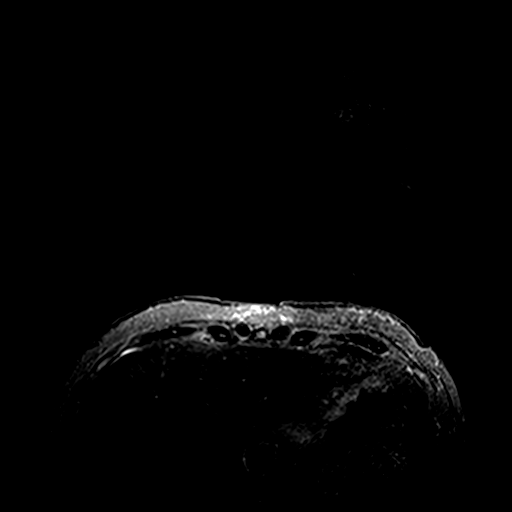

[Series 3: fl3d pre-cm no · axial · non-contrast · 1.2mm · 0.91mm/px · z∈[-88,+84]mm · 5 of 144 slices shown]
[im 1/144]
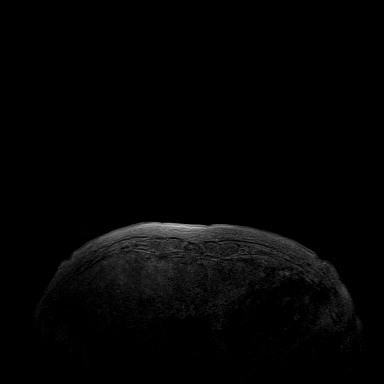
[im 36/144]
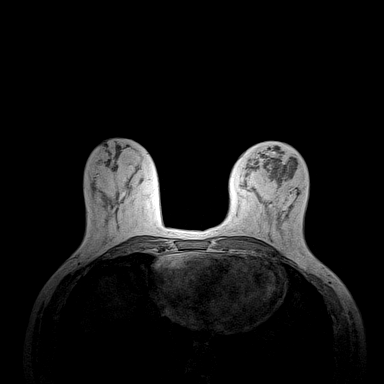
[im 72/144]
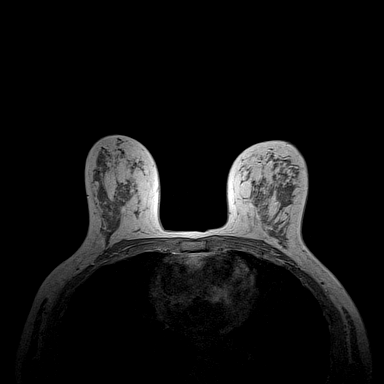
[im 108/144]
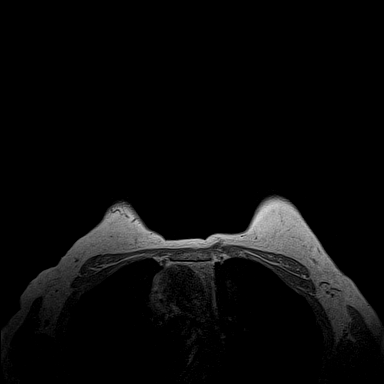
[im 144/144]
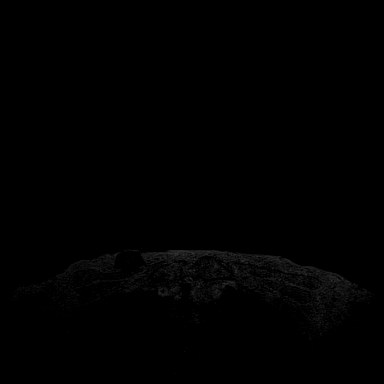

[Series 4: fl3d pre-cm · axial · non-contrast · 1.2mm · 0.91mm/px · z∈[-88,+84]mm · 5 of 144 slices shown]
[im 1/144]
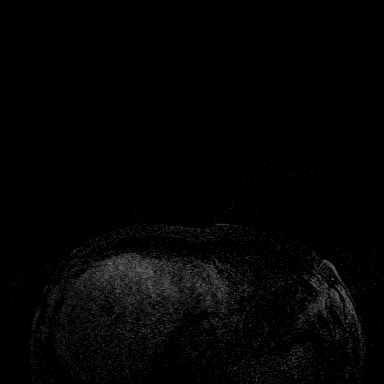
[im 36/144]
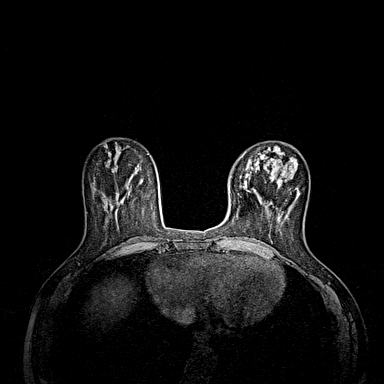
[im 72/144]
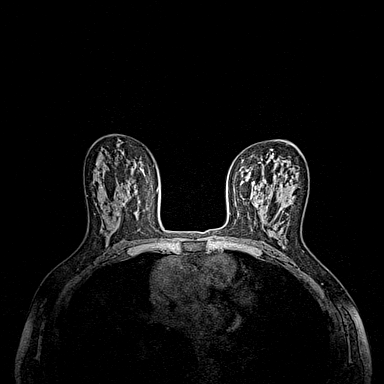
[im 108/144]
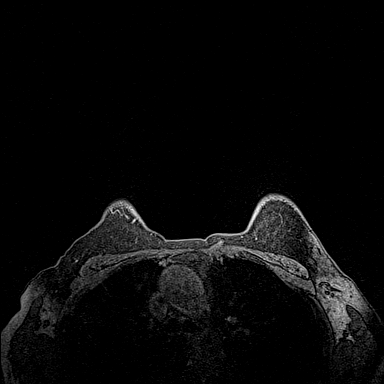
[im 144/144]
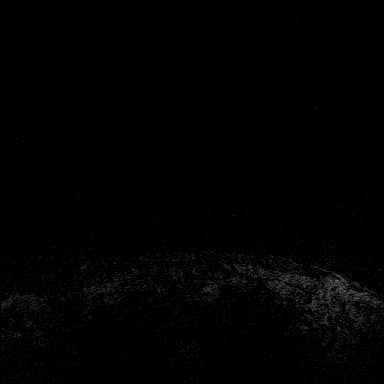

[Series 5: fl3d post-cm 20 · axial · 1.2mm · 0.91mm/px · z∈[-88,+84]mm · 5 of 144 slices shown (1 of 3)]
[im 1/144]
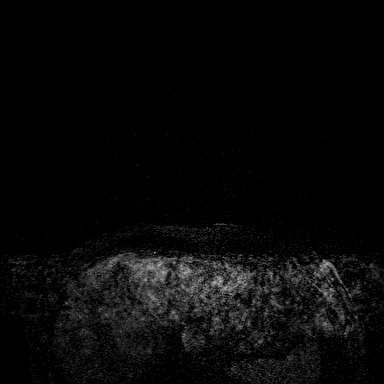
[im 36/144]
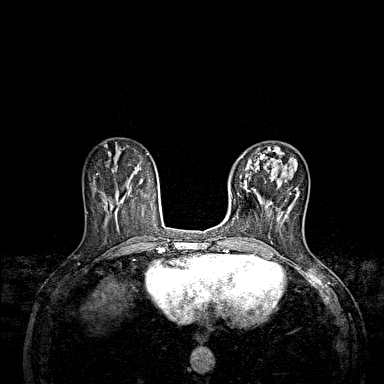
[im 72/144]
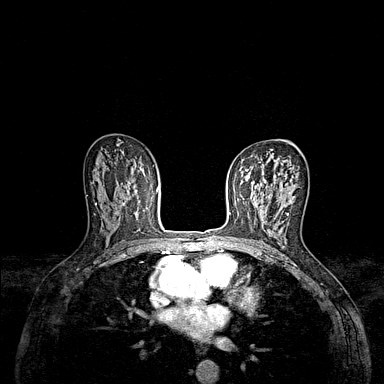
[im 108/144]
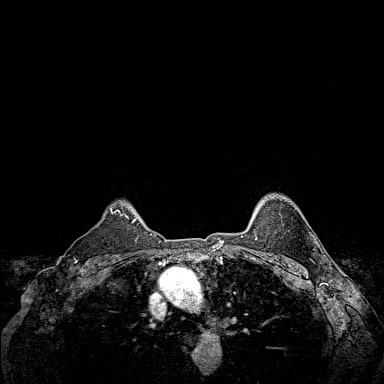
[im 144/144]
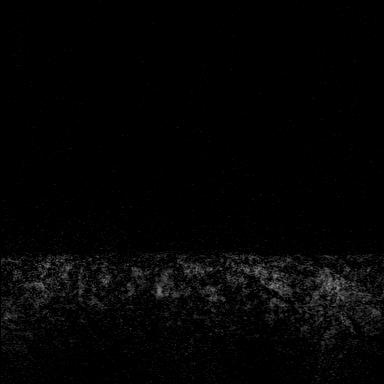

[Series 6: fl3d post-cm 20 · axial · 1.2mm · 0.91mm/px · z∈[-88,+84]mm · 5 of 144 slices shown (2 of 3)]
[im 1/144]
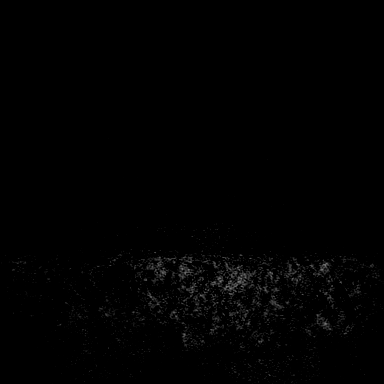
[im 36/144]
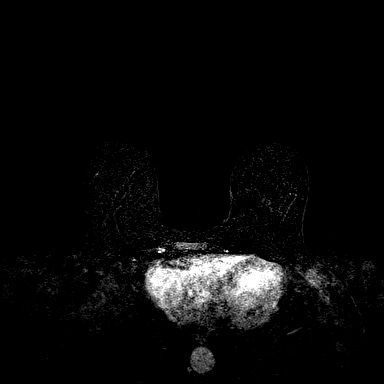
[im 72/144]
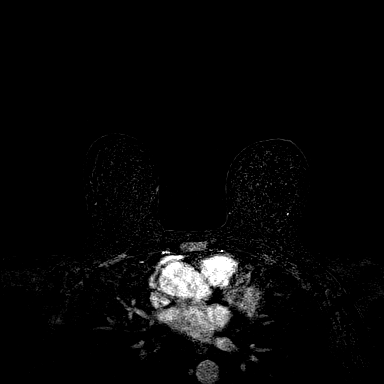
[im 108/144]
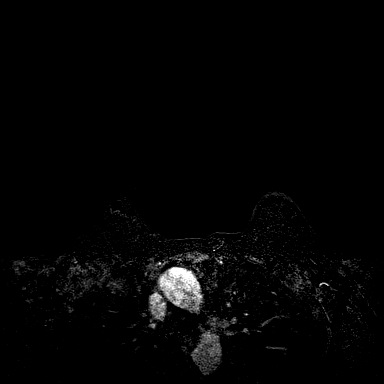
[im 144/144]
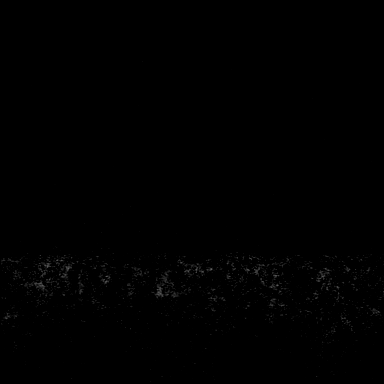

[Series 7: fl3d post-cm 20 · axial · 172.8mm · 0.91mm/px · 1 of 1 slices shown (3 of 3)]
[im 1/1]
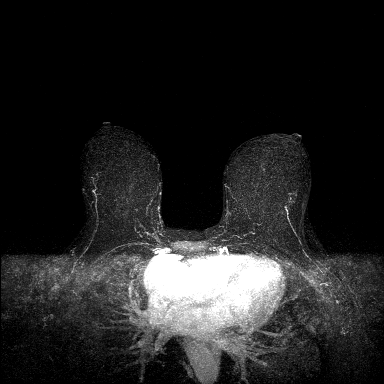

[Series 8: fl3d post-cm 3min · axial · 1.2mm · 0.91mm/px · z∈[-88,+84]mm · 6 of 144 slices shown]
[im 1/144]
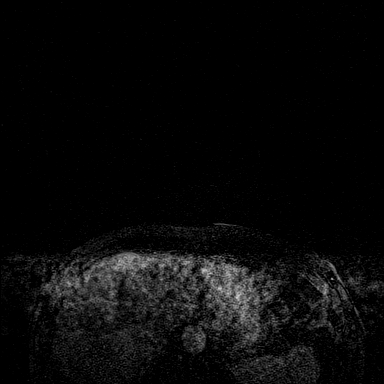
[im 29/144]
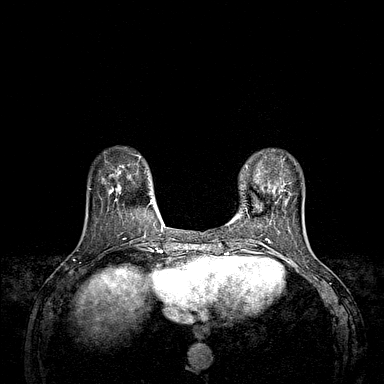
[im 58/144]
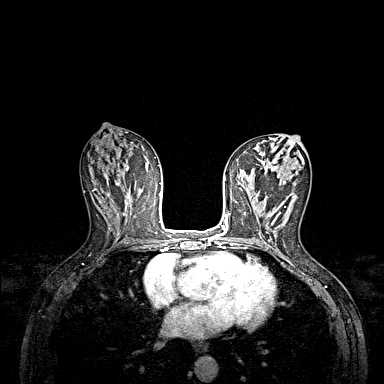
[im 86/144]
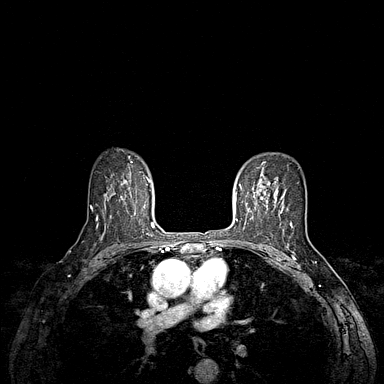
[im 115/144]
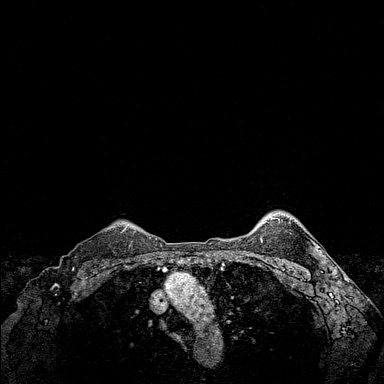
[im 144/144]
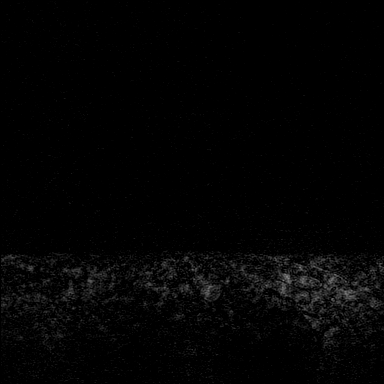

[Series 9: fl3d post-cm 3min_sub · axial · 1.2mm · 0.91mm/px · z∈[-88,+14]mm · 4 of 144 slices shown]
[im 1/144]
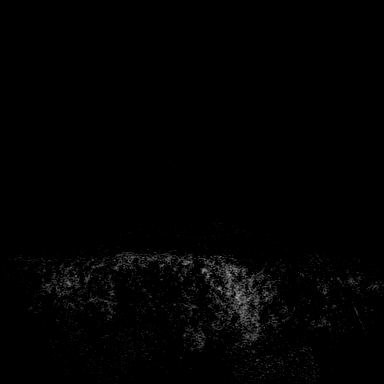
[im 29/144]
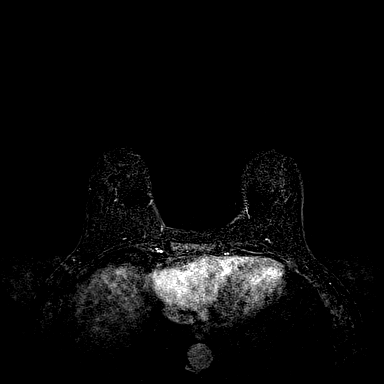
[im 58/144]
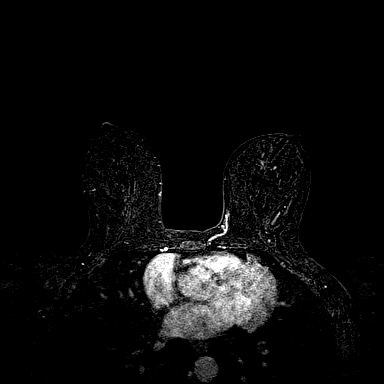
[im 86/144]
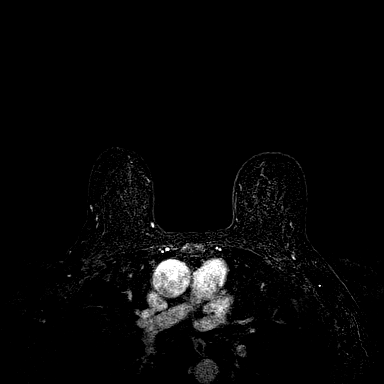

[32 of 48 positions shown; findings below may reference images not displayed]

THREE-DIMENSIONAL MR IMAGE RENDERING ON INDEPENDENT WORKSTATION:

Three-dimensional MR images were rendered by post-processing of the
original MR data on an independent workstation. The
three-dimensional MR images were interpreted, and findings are
reported in the following complete MRI report for this study. Three
dimensional images were evaluated at the independent [employer]
workstation
FINDINGS: Breast composition: c. Heterogeneous fibroglandular tissue.

Background parenchymal enhancement: Minimal

Right breast: No mass or abnormal enhancement.

Left breast: Excellent interval response to chemotherapy at the site
of the patient's previously identified biopsy-proven malignancy.
There is minimal remaining enhancement at the site of biopsy proven
malignancy. There is a tiny focus of low-grade enhancement along the
superior margin on series 6, image 96, unchanged. There is a small 5
mm rounded region of enhancement along the superior margin on the 5
minutes delayed images, series 12, image 96. No other suspicious
abnormalities.

Lymph nodes: No abnormal appearing lymph nodes.

Ancillary findings:  None.
IMPRESSION: 1. The patient's previously biopsied left breast malignancy has
responded excellently to chemotherapy with only minimal enhancement
remaining within the biopsied mass and 2 tiny rounded regions of
enhancement along the superior margin.
2. No MRI evidence of malignancy in the right breast.

RECOMMENDATION:
Continued follow-up with the patient's surgeon and oncologist.

BI-RADS CATEGORY  6: Known biopsy-proven malignancy.

## 2017-01-24 ENCOUNTER — Telehealth: Payer: Self-pay | Admitting: Family Medicine

## 2017-01-24 MED ORDER — ATENOLOL 50 MG PO TABS: 50.0000 mg | ORAL_TABLET | Freq: Every day | ORAL | 3 refills | Status: DC

## 2017-01-24 NOTE — Telephone Encounter (Signed)
Bisoprolol-HCTZ on backorder - [facility] is wanting a replacement. Per Dr. Dennard Schaumann Atenolol 50mg  qd - med sent to pharm.

## 2017-01-26 ENCOUNTER — Telehealth: Payer: Self-pay | Admitting: Hematology and Oncology

## 2017-01-26 NOTE — Telephone Encounter (Signed)
Patient called and left a voice message about side affects and wanted to schedule appointment.

## 2017-01-28 ENCOUNTER — Telehealth: Payer: Self-pay | Admitting: Hematology and Oncology

## 2017-01-28 NOTE — Telephone Encounter (Signed)
Spoke to patient regarding voicemail she left earlier today. Patient canceled 1/28 appointment because she would like to see her PCP.

## 2017-01-31 ENCOUNTER — Ambulatory Visit: Payer: BC Managed Care – PPO | Admitting: Hematology and Oncology

## 2017-02-08 ENCOUNTER — Ambulatory Visit: Payer: BC Managed Care – PPO | Admitting: Family Medicine

## 2017-02-08 ENCOUNTER — Encounter: Payer: Self-pay | Admitting: Family Medicine

## 2017-02-08 VITALS — BP 200/110 | HR 70 | Temp 98.2°F | Resp 14 | Ht 64.0 in

## 2017-02-08 DIAGNOSIS — J069 Acute upper respiratory infection, unspecified: Secondary | ICD-10-CM

## 2017-02-08 DIAGNOSIS — I10 Essential (primary) hypertension: Secondary | ICD-10-CM | POA: Diagnosis not present

## 2017-02-08 MED ORDER — HYDROCODONE-HOMATROPINE 5-1.5 MG/5ML PO SYRP: 5.0000 mL | ORAL_SOLUTION | Freq: Three times a day (TID) | ORAL | 0 refills | Status: DC | PRN

## 2017-02-08 MED ORDER — AMOXICILLIN-POT CLAVULANATE 875-125 MG PO TABS: 1.0000 | ORAL_TABLET | Freq: Two times a day (BID) | ORAL | 0 refills | Status: DC

## 2017-02-08 MED ORDER — HYDROCHLOROTHIAZIDE 25 MG PO TABS: 25.0000 mg | ORAL_TABLET | Freq: Every day | ORAL | 3 refills | Status: DC

## 2017-02-08 NOTE — Progress Notes (Signed)
Subjective:    Patient ID: Kim Ortega, female    DOB: 1956/01/04, 62 y.o.   MRN: [id number]  HPI  Patient reports a 5-day history of rhinorrhea, head congestion, sinus pressure and pain, postnasal drip, cough.  Cough is productive of green sputum.  Nasal drainage is productive of green mucus.  She denies any fevers or chills.  She denies any shortness of breath.  Her blood pressure is extremely high today although she is completely asymptomatic.  She denies any chest pain.  She denies any shortness of breath.  She denies any headache or neurologic deficit.  She denies any blurry vision. Past Medical History:  Diagnosis Date  . Allergy   . Anxiety    mild no regular meds  . Arthritis    psoriatic arthritis -back  . Breast cancer ([place]) 06/25/15 bx   left breastinvasive ductal ca ,grade 1  . Episcleritis   . Hormone replacement therapy   . Hypertension   . Left carotid artery stenosis    40-59% (01/2015)  . Personal history of chemotherapy 2017   neoadjuvant  . Personal history of radiation therapy 2018  . Psoriasis   . Smoker    Past Surgical History:  Procedure Laterality Date  . ABDOMINAL HYSTERECTOMY    . BREAST LUMPECTOMY WITH RADIOACTIVE SEED AND SENTINEL LYMPH NODE BIOPSY Left 01/08/2016   Procedure: LEFT BREAST LUMPECTOMY WITH RADIOACTIVE SEED AND SENTINEL LYMPH NODE BIOPSY, BLUE DYE INJECTION;  Surgeon: Rolm Bookbinder, MD;  Location: [facility];  Service: General;  Laterality: Left;  . cervical acdf     C5-6- fusion with plates/screws "Dr. Sherwood Gambler" '12  . COLONOSCOPY WITH PROPOFOL N/A 04/16/2014   Procedure: COLONOSCOPY WITH PROPOFOL;  Surgeon: Garlan Fair, MD;  Location: [facility];  Service: Endoscopy;  Laterality: N/A;  . COLPOSCOPY    . laporotomy     Ovarian cyst  . PORT-A-CATH REMOVAL Right 01/08/2016   Procedure: REMOVAL PORT-A-CATH;  Surgeon: Rolm Bookbinder, MD;  Location: [facility];  Service: General;  Laterality:  Right;  . PORTACATH PLACEMENT N/A 07/24/2015   Procedure: INSERTION PORT-A-CATH WITH [place];  Surgeon: Rolm Bookbinder, MD;  Location: [facility];  Service: General;  Laterality: N/A;  . TONSILLECTOMY     child   No Known Allergies Current Outpatient Medications on File Prior to Visit  Medication Sig Dispense Refill  . ALPRAZolam (XANAX) 0.5 MG tablet Take 0.5 mg by mouth 3 (three) times daily as needed for anxiety.    [name] aspirin EC 81 MG tablet Take 81 mg by mouth daily.    [name] atenolol (TENORMIN) 50 MG tablet Take 1 tablet (50 mg total) by mouth daily. 90 tablet 3  . atorvastatin (LIPITOR) 20 MG tablet Take 1 tablet (20 mg total) by mouth daily. 90 tablet 3  . Calcium Carbonate-Vitamin D (CALCIUM-D PO) Take 1 tablet by mouth 2 (two) times daily.     . cetirizine (ZYRTEC) 10 MG tablet Take 10 mg by mouth daily.    . clobetasol (OLUX) 0.05 % topical foam Apply 1 application topically daily as needed (psoriasis).   2  . diclofenac sodium (VOLTAREN) 1 % GEL Apply 2 g topically 3 (three) times daily as needed (Pain).     . fluticasone (FLONASE) 50 MCG/ACT nasal spray SPRAY TWICE INTO EACH NOSTRIL EVERY DAY 16 g 11  . ibuprofen (ADVIL,MOTRIN) 200 MG tablet Take 400 mg by mouth every 6 (six) hours as needed for headache (pain).    [name]  Ketotifen Fumarate (ALLERGY EYE DROPS OP) Place 1 drop into both eyes 2 (two) times daily as needed (allergies).     [name] letrozole (FEMARA) 2.5 MG tablet Take 1 tablet (2.5 mg total) daily by mouth. 90 tablet 3  . losartan (COZAAR) 50 MG tablet TAKE 1 TABLET BY MOUTH ONCE DAILY. 90 tablet 3  . Multiple Vitamin (MULTIVITAMIN WITH MINERALS) TABS tablet Take 1 tablet by mouth daily.    [name] tiZANidine (ZANAFLEX) 4 MG tablet Take 4 mg by mouth at bedtime as needed (back pain).   2   No current facility-administered medications on file prior to visit.    Social History   Socioeconomic History  . Marital status: Married    Spouse name: Not on file  . Number of children: Not on file    . Years of education: Not on file  . Highest education level: Not on file  Social Needs  . Financial resource strain: Not on file  . Food insecurity - worry: Not on file  . Food insecurity - inability: Not on file  . Transportation needs - medical: Not on file  . Transportation needs - non-medical: Not on file  Occupational History  . Not on file  Tobacco Use  . Smoking status: Current Every Day Smoker    Packs/day: 0.50    Years: 20.00    Pack years: 10.00    Types: Cigarettes  . Smokeless tobacco: Never Used  Substance and Sexual Activity  . Alcohol use: Yes    Alcohol/week: 6.0 oz    Types: 10 Glasses of wine per week    Comment: Glasses of wine several times a week  . Drug use: No  . Sexual activity: Yes    Birth control/protection: Surgical    Comment: married  Other Topics Concern  . Not on file  Social History Narrative  . Not on file     Review of Systems  All other systems reviewed and are negative.      Objective:   Physical Exam  Constitutional: She is oriented to person, place, and time. She appears well-developed and well-nourished. No distress.  HENT:  Right Ear: Tympanic membrane and ear canal normal.  Left Ear: Tympanic membrane and ear canal normal.  Nose: Mucosal edema and rhinorrhea present. Right sinus exhibits maxillary sinus tenderness. Left sinus exhibits maxillary sinus tenderness.  Mouth/Throat: Oropharynx is clear and moist. No oropharyngeal exudate.  Cardiovascular: Normal rate, regular rhythm and normal heart sounds.  No murmur heard. Pulmonary/Chest: Effort normal and breath sounds normal. No respiratory distress. She has no wheezes. She has no rales.  Neurological: She is alert and oriented to person, place, and time. No cranial nerve deficit. She exhibits normal muscle tone. Coordination normal.  Skin: She is not diaphoretic.  Vitals reviewed.         Assessment & Plan:  Benign essential HTN  Viral upper respiratory tract  infection  Patient's blood pressure is extremely high but does not demonstrate a hypertensive emergency as there is no evidence of endorgan.  Recommended starting hydrochlorothiazide 25 mg a day and rechecking blood pressure in 1 week.  Her blood pressure has never been this high in the past.  I do believe some of this is false.  I recommended discontinuing over-the-counter decongestants that she has been taking.  Use Hycodan 1 teaspoon every 6 hours as needed for cough.  I believe this is most likely a viral upper respiratory infection.  This should resolve over the next  3-5 days spontaneously on its own.  If symptoms worsen given her complicated past medical history, I would treat the patient for sinusitis with Augmentin.  I did give the patient a prescription however she will not fill that prescription for the next 3-5 days unless symptoms drastically worsen.  Recheck blood pressure in 1 week

## 2017-02-15 ENCOUNTER — Ambulatory Visit: Payer: Self-pay | Admitting: Family Medicine

## 2017-02-21 ENCOUNTER — Encounter: Payer: Self-pay | Admitting: Family Medicine

## 2017-02-21 ENCOUNTER — Ambulatory Visit: Payer: BC Managed Care – PPO | Admitting: Family Medicine

## 2017-02-21 VITALS — BP 110/64 | HR 74 | Temp 97.9°F | Resp 14 | Ht 64.0 in | Wt 121.0 lb

## 2017-02-21 DIAGNOSIS — I10 Essential (primary) hypertension: Secondary | ICD-10-CM

## 2017-02-21 NOTE — Progress Notes (Signed)
Subjective:    Patient ID: Kim Ortega, female    DOB: 05/17/1955, 62 y.o.   MRN: [id number]  HPI 02/08/17 Patient reports a 5-day history of rhinorrhea, head congestion, sinus pressure and pain, postnasal drip, cough.  Cough is productive of green sputum.  Nasal drainage is productive of green mucus.  She denies any fevers or chills.  She denies any shortness of breath.  Her blood pressure is extremely high today although she is completely asymptomatic.  She denies any chest pain.  She denies any shortness of breath.  She denies any headache or neurologic deficit.  She denies any blurry vision.  At that time, my plan was: Patient's blood pressure is extremely high but does not demonstrate a hypertensive emergency as there is no evidence of endorgan.  Recommended starting hydrochlorothiazide 25 mg a day and rechecking blood pressure in 1 week.  Her blood pressure has never been this high in the past.  I do believe some of this is false.  I recommended discontinuing over-the-counter decongestants that she has been taking.  Use Hycodan 1 teaspoon every 6 hours as needed for cough.  I believe this is most likely a viral upper respiratory infection.  This should resolve over the next 3-5 days spontaneously on its own.  If symptoms worsen given her complicated past medical history, I would treat the patient for sinusitis with Augmentin.  I did give the patient a prescription however she will not fill that prescription for the next 3-5 days unless symptoms drastically worsen.  Recheck blood pressure in 1 week  02/21/17 Blood pressure is much better today at 110/64.  Patient has been getting similar readings at home suggesting that her previous office visits 200/110 was artificially elevated likely due to cold medication.  She denies any dizziness or near syncope.  She denies any chest pain shortness of breath or dyspnea on exertion Past Medical History:  Diagnosis Date  . Allergy   . Anxiety    mild no  regular meds  . Arthritis    psoriatic arthritis -back  . Breast cancer ([place]) 06/25/15 bx   left breastinvasive ductal ca ,grade 1  . Episcleritis   . Hormone replacement therapy   . Hypertension   . Left carotid artery stenosis    40-59% (01/2015)  . Personal history of chemotherapy 2017   neoadjuvant  . Personal history of radiation therapy 2018  . Psoriasis   . Smoker    Past Surgical History:  Procedure Laterality Date  . ABDOMINAL HYSTERECTOMY    . BREAST LUMPECTOMY WITH RADIOACTIVE SEED AND SENTINEL LYMPH NODE BIOPSY Left 01/08/2016   Procedure: LEFT BREAST LUMPECTOMY WITH RADIOACTIVE SEED AND SENTINEL LYMPH NODE BIOPSY, BLUE DYE INJECTION;  Surgeon: Rolm Bookbinder, MD;  Location: [facility];  Service: General;  Laterality: Left;  . cervical acdf     C5-6- fusion with plates/screws "Dr. Sherwood Gambler" '12  . COLONOSCOPY WITH PROPOFOL N/A 04/16/2014   Procedure: COLONOSCOPY WITH PROPOFOL;  Surgeon: Garlan Fair, MD;  Location: [facility];  Service: Endoscopy;  Laterality: N/A;  . COLPOSCOPY    . laporotomy     Ovarian cyst  . PORT-A-CATH REMOVAL Right 01/08/2016   Procedure: REMOVAL PORT-A-CATH;  Surgeon: Rolm Bookbinder, MD;  Location: [facility];  Service: General;  Laterality: Right;  . PORTACATH PLACEMENT N/A 07/24/2015   Procedure: INSERTION PORT-A-CATH WITH [place];  Surgeon: Rolm Bookbinder, MD;  Location: [facility];  Service: General;  Laterality: N/A;  .  TONSILLECTOMY     child   No Known Allergies Current Outpatient Medications on File Prior to Visit  Medication Sig Dispense Refill  . ALPRAZolam (XANAX) 0.5 MG tablet Take 0.5 mg by mouth 3 (three) times daily as needed for anxiety.    [name] aspirin EC 81 MG tablet Take 81 mg by mouth daily.    [name] atenolol (TENORMIN) 50 MG tablet Take 1 tablet (50 mg total) by mouth daily. 90 tablet 3  . atorvastatin (LIPITOR) 20 MG tablet Take 1 tablet (20 mg total) by mouth daily. 90 tablet 3  . Calcium  Carbonate-Vitamin D (CALCIUM-D PO) Take 1 tablet by mouth 2 (two) times daily.     . cetirizine (ZYRTEC) 10 MG tablet Take 10 mg by mouth daily.    . clobetasol (OLUX) 0.05 % topical foam Apply 1 application topically daily as needed (psoriasis).   2  . diclofenac sodium (VOLTAREN) 1 % GEL Apply 2 g topically 3 (three) times daily as needed (Pain).     . fluticasone (FLONASE) 50 MCG/ACT nasal spray SPRAY TWICE INTO EACH NOSTRIL EVERY DAY 16 g 11  . hydrochlorothiazide (HYDRODIURIL) 25 MG tablet Take 1 tablet (25 mg total) by mouth daily. 90 tablet 3  . ibuprofen (ADVIL,MOTRIN) 200 MG tablet Take 400 mg by mouth every 6 (six) hours as needed for headache (pain).    . Ketotifen Fumarate (ALLERGY EYE DROPS OP) Place 1 drop into both eyes 2 (two) times daily as needed (allergies).     [name] letrozole (FEMARA) 2.5 MG tablet Take 1 tablet (2.5 mg total) daily by mouth. 90 tablet 3  . losartan (COZAAR) 50 MG tablet TAKE 1 TABLET BY MOUTH ONCE DAILY. 90 tablet 3  . Multiple Vitamin (MULTIVITAMIN WITH MINERALS) TABS tablet Take 1 tablet by mouth daily.    [name] tiZANidine (ZANAFLEX) 4 MG tablet Take 4 mg by mouth at bedtime as needed (back pain).   2   No current facility-administered medications on file prior to visit.    Social History   Socioeconomic History  . Marital status: Married    Spouse name: Not on file  . Number of children: Not on file  . Years of education: Not on file  . Highest education level: Not on file  Social Needs  . Financial resource strain: Not on file  . Food insecurity - worry: Not on file  . Food insecurity - inability: Not on file  . Transportation needs - medical: Not on file  . Transportation needs - non-medical: Not on file  Occupational History  . Not on file  Tobacco Use  . Smoking status: Current Every Day Smoker    Packs/day: 0.50    Years: 20.00    Pack years: 10.00    Types: Cigarettes  . Smokeless tobacco: Never Used  Substance and Sexual Activity  .  Alcohol use: Yes    Alcohol/week: 6.0 oz    Types: 10 Glasses of wine per week    Comment: Glasses of wine several times a week  . Drug use: No  . Sexual activity: Yes    Birth control/protection: Surgical    Comment: married  Other Topics Concern  . Not on file  Social History Narrative  . Not on file     Review of Systems  All other systems reviewed and are negative.      Objective:   Physical Exam  Constitutional: She is oriented to person, place, and time. She appears well-developed and well-nourished.  No distress.  HENT:  Right Ear: Tympanic membrane and ear canal normal.  Left Ear: Tympanic membrane and ear canal normal.  Nose: No mucosal edema or rhinorrhea. Right sinus exhibits no maxillary sinus tenderness. Left sinus exhibits no maxillary sinus tenderness.  Mouth/Throat: Oropharynx is clear and moist. No oropharyngeal exudate.  Cardiovascular: Normal rate, regular rhythm and normal heart sounds.  No murmur heard. Pulmonary/Chest: Effort normal and breath sounds normal. No respiratory distress. She has no wheezes. She has no rales.  Neurological: She is alert and oriented to person, place, and time. No cranial nerve deficit. She exhibits normal muscle tone. Coordination normal.  Skin: She is not diaphoretic.  Vitals reviewed.         Assessment & Plan:  Benign essential HTN Blood pressure today is much better.  Continue hydrochlorothiazide at the present time.  Patient will monitor her blood pressure at home and if blood pressure drops further, we will reduce dose of hydrochlorothiazide.  Continue to encourage smoking cessation

## 2017-04-18 ENCOUNTER — Other Ambulatory Visit: Payer: Self-pay | Admitting: Family Medicine

## 2017-06-08 ENCOUNTER — Encounter: Payer: Self-pay | Admitting: Family Medicine

## 2017-06-08 ENCOUNTER — Ambulatory Visit: Payer: BC Managed Care – PPO | Admitting: Family Medicine

## 2017-06-08 VITALS — BP 110/76 | HR 72 | Temp 98.1°F | Resp 16 | Ht 64.0 in | Wt 118.0 lb

## 2017-06-08 DIAGNOSIS — M5417 Radiculopathy, lumbosacral region: Secondary | ICD-10-CM | POA: Diagnosis not present

## 2017-06-08 DIAGNOSIS — S39012A Strain of muscle, fascia and tendon of lower back, initial encounter: Secondary | ICD-10-CM | POA: Diagnosis not present

## 2017-06-08 MED ORDER — PREDNISONE 20 MG PO TABS: 40.0000 mg | ORAL_TABLET | Freq: Every day | ORAL | 0 refills | Status: AC

## 2017-06-08 MED ORDER — HYDROCODONE-ACETAMINOPHEN 5-325 MG PO TABS: 1.0000 | ORAL_TABLET | Freq: Three times a day (TID) | ORAL | 0 refills | Status: DC | PRN

## 2017-06-08 MED ORDER — KETOROLAC TROMETHAMINE 60 MG/2ML IM SOLN
30.0000 mg | Freq: Once | INTRAMUSCULAR | Status: AC
  Administered 2017-06-08: 30 mg via INTRAMUSCULAR

## 2017-06-08 MED ORDER — CYCLOBENZAPRINE HCL 5 MG PO TABS: 5.0000 mg | ORAL_TABLET | Freq: Two times a day (BID) | ORAL | 0 refills | Status: DC | PRN

## 2017-06-08 NOTE — Patient Instructions (Addendum)
Will start a burst of steroids today for radiculopathy. Use muscle relaxers at night and first thing in the morning to help with periods of immobility and to help with muscle spasms Use pain medication sparingly, and use Tylenol as first-line pain medication  Do not use over-the-counter NSAIDs while you are taking prednisone as it can be very hard on the stomach  After prednisone prescription is done, start using naproxen 500 mg twice daily for the next week, if you have any stomach irritation make sure you take medications with food, and you can also start an over-the-counter proton pump inhibitor like Prilosec  Change positions frequently, avoid long periods of immobility, use heat therapy often.  Start gentle stretching only after severe pain has resolved, hope this will be in 5 to 7 days  If you do not improve as quickly as he would like we can send you to physical therapy  Anticipate 4 to 6 weeks of healing time, with any concerning delay or any acute worsening of this back pain I would go to your orthopedist for evaluation or at least reach out to them for advice   DO NOT USE PAIN MEDICATION AND MUSCLE RELAXER AT THE SAME TIME, WILL BE TOO SEDATING  DO NOT USE FLEXERIL AND DRIVE OR WORK WITH PATIENTS.

## 2017-06-08 NOTE — Addendum Note (Signed)
Addended by: Shary Decamp B on: 06/08/2017 04:58 PM   Modules accepted: Orders

## 2017-06-08 NOTE — Progress Notes (Signed)
Patient ID: Kim Ortega, female    DOB: March 10, 1955, 62 y.o.   MRN: [id number]  PCP: [name], MD  Chief Complaint  Patient presents with  . Back Pain    Subjective:   Kim Ortega is a 62 y.o. female, presents to clinic with CC of left low back strain that began 4 days ago after attempting to move her king-size mattress and put a bed skirt underneath.  Pain is been constant, severe, located to her left low back with radiation to her left buttocks and down her leg, exacerbated with periods of immobility such as sleeping at night and attempting to get up in the morning, alleviated by movement for part of the day however by the end of the day pain again becomes more severe acutely worsens when she is stepping on her left leg.  She has a history of bulging disks, is managed by [facility], she was previously given Zanaflex, and she attempted to use 1 of her old Zanaflex pills that she believes is expired and it did not help her pain much.  She took Tylenol today and also did not touch her pain.  She denies any numbness, tingling, weakness, genital numbness, incontinence of stool or urine.  She did begin to have muscle spasms this morning which is also exacerbated to pain.  Is of a low back brace but she was unable to find it today.  Attempted gentle stretching but her pain was too severe in her muscles to tighten spasm and to continue and she got no relief from that.   Patient Active Problem List   Diagnosis Date Noted  . 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 Antineoplastic chemotherapy induced anemia 08/27/2015  . Breast cancer of lower-inner quadrant of left female breast ([place]) 07/03/2015  . Left carotid artery stenosis   . Psoriasis   . Hormone replacement therapy   . Smoker   . Episcleritis      Prior to Admission medications   Medication Sig Start Date End Date Taking? Authorizing Provider  ALPRAZolam Duanne Moron) 0.5 MG tablet Take 0.5 mg  by mouth 3 (three) times daily as needed for anxiety.   Yes [provider]  aspirin EC 81 MG tablet Take 81 mg by mouth daily.   Yes [provider]  atenolol (TENORMIN) 50 MG tablet Take 1 tablet (50 mg total) by mouth daily. 01/24/17  Yes [name], MD  atorvastatin (LIPITOR) 20 MG tablet Take 1 tablet (20 mg total) by mouth daily. 10/25/16  Yes [name], MD  Calcium Carbonate-Vitamin D (CALCIUM-D PO) Take 1 tablet by mouth 2 (two) times daily.    Yes [provider]  cetirizine (ZYRTEC) 10 MG tablet Take 10 mg by mouth daily.   Yes [provider]  clobetasol (OLUX) 0.05 % topical foam Apply 1 application topically daily as needed (psoriasis).  07/10/15  Yes [provider]  fluticasone (FLONASE) 50 MCG/ACT nasal spray SPRAY TWICE INTO EACH NOSTRIL EVERY DAY 08/26/16  Yes [name], MD  hydrochlorothiazide (HYDRODIURIL) 25 MG tablet Take 1 tablet (25 mg total) by mouth daily. 02/08/17  Yes [name], MD  ibuprofen (ADVIL,MOTRIN) 200 MG tablet Take 400 mg by mouth every 6 (six) hours as needed for headache (pain).   Yes [provider]  Ketotifen Fumarate (ALLERGY EYE DROPS OP) Place 1 drop into both eyes 2 (two) times daily as needed (allergies).    Yes [provider]  letrozole [facility]) 2.5 MG tablet Take 1 tablet (2.5 mg total) daily by mouth. 11/19/16  Yes [name], MD  losartan (COZAAR) 50 MG tablet TAKE 1 TABLET BY MOUTH ONCE DAILY. 04/18/17  Yes [name], MD  Multiple Vitamin (MULTIVITAMIN WITH MINERALS) TABS tablet Take 1 tablet by mouth daily.   Yes [provider]     No Known Allergies   Family History  Problem Relation Age of Onset  . Stroke Mother      Social History   Socioeconomic History  . Marital status: Married    Spouse name: Not on file  . Number of children: Not on file  . Years of education: Not on file  . Highest education level: Not on file    Occupational History  . Not on file  Social Needs  . Financial resource strain: Not on file  . Food insecurity:    Worry: Not on file    Inability: Not on file  . Transportation needs:    Medical: Not on file    Non-medical: Not on file  Tobacco Use  . Smoking status: Current Every Day Smoker    Packs/day: 0.50    Years: 20.00    Pack years: 10.00    Types: Cigarettes  . Smokeless tobacco: Never Used  Substance and Sexual Activity  . Alcohol use: Yes    Alcohol/week: 6.0 oz    Types: 10 Glasses of wine per week    Comment: Glasses of wine several times a week  . Drug use: No  . Sexual activity: Yes    Birth control/protection: Surgical    Comment: married  Lifestyle  . Physical activity:    Days per week: Not on file    Minutes per session: Not on file  . Stress: Not on file  Relationships  . Social connections:    Talks on phone: Not on file    Gets together: Not on file    Attends religious service: Not on file    Active member of club or organization: Not on file    Attends meetings of clubs or organizations: Not on file    Relationship status: Not on file  . Intimate partner violence:    Fear of current or ex partner: Not on file    Emotionally abused: Not on file    Physically abused: Not on file    Forced sexual activity: Not on file  Other Topics Concern  . Not on file  Social History Narrative  . Not on file     Review of Systems  Constitutional: Negative.   HENT: Negative.   Respiratory: Negative.   Cardiovascular: Negative.   Gastrointestinal: Negative.   Genitourinary: Negative.   Musculoskeletal: Positive for arthralgias, back pain, gait problem and myalgias. Negative for joint swelling, neck pain and neck stiffness.  Skin: Negative.   Hematological: Negative.        Objective:    Vitals:   06/08/17 1603  BP: 110/76  Pulse: 72  Resp: 16  Temp: 98.1 F (36.7 C)  TempSrc: Oral  SpO2: 99%  Weight: 118 lb (53.5 kg)  Height: 5\' 4"   (1.626 m)      Physical Exam  Constitutional: She is oriented to person, place, and time. She appears well-developed and well-nourished.  Non-toxic appearance. She does not have a sickly appearance. She does not appear ill. No distress.  Well-appearing female, appears stated age, appears uncomfortable and stiff, frequently repositioning in chair and  wincing with pain, no distress, nontoxic-appearing  HENT:  Head: Normocephalic and atraumatic.  Right Ear: External ear normal.  Left Ear: External ear normal.  Nose: Nose normal.  Eyes: Pupils are equal, round, and reactive to light. Conjunctivae are normal. Right eye exhibits no discharge. Left eye exhibits no discharge.  Neck: Phonation normal. Neck supple. No spinous process tenderness and no muscular tenderness present. No tracheal deviation, no edema and no erythema present.  Cardiovascular: Normal rate, regular rhythm and intact distal pulses.  Pulmonary/Chest: Effort normal. No stridor. No respiratory distress.  Abdominal: She exhibits no distension.  Musculoskeletal: She exhibits tenderness. She exhibits no deformity.       Cervical back: Normal.       Thoracic back: Normal.       Lumbar back: She exhibits decreased range of motion, tenderness and pain. She exhibits no bony tenderness, no swelling, no edema, no deformity and no spasm.  No midline spinal tenderness from cervical to lumbar spine, no step-offs Left lumbar paraspinal muscle tenderness to palpation, no spasms palpated in a standing or sitting position.  Patient has decreased range of motion of her back secondary to pain and muscle spasms.   Left SI joint tenderness to palpation Patient can flex and extend bilaterally at the hips   Neurological: She is alert and oriented to person, place, and time. She has normal strength. No sensory deficit. She exhibits normal muscle tone. Gait abnormal. Coordination normal.  Antalgic gait  Skin: Skin is warm and dry. No rash noted. She  is not diaphoretic. No erythema.  Psychiatric: She has a normal mood and affect. Her behavior is normal.  Nursing note and vitals reviewed.         Assessment & Plan:      ICD-10-CM   1. Lumbosacral radiculopathy M54.17   2. Low back strain, initial encounter S39.012A     Will do short steroid burst for radicular symptoms, pain medication and muscle relaxers for low back strain and muscle spasms.   Tylenol first-line for pain, Norco sparingly afterwards, patient was instructed not to combine Norco and muscle relaxers because of potentiating and sedating effects, advised that she is unable to drive or work when on pain medications or muscle relaxers  After she has finished steroids she was instructed to use NSAIDs and Tylenol as first-line for pain  Instructed to avoid lifting anything over 5 to 10 pounds, wear low back brace to help support her and discouraged any bending over activities, frequent position changes, heat therapy.  Patient does not improve significantly in about 2 weeks suggested physical therapy to improve pain and function.  If no improvement after about 4 to 6 weeks or with any concerning red flags, patient was urged to reach out to her orthopedic surgeon who manages her chronic low back pain and bulging disks.  She noted she has an MRI but I could not find the image to review in our electronic medical record.  No red flags on exam, no neuro deficit, antalgic gait, no midline tenderness, no saddle anesthesia or incontinence of urine or stool.   Delsa Grana, PA-C 06/08/17 4:25 PM

## 2017-06-15 ENCOUNTER — Other Ambulatory Visit: Payer: Self-pay

## 2017-06-15 ENCOUNTER — Ambulatory Visit: Payer: BC Managed Care – PPO | Admitting: Family Medicine

## 2017-06-15 ENCOUNTER — Encounter: Payer: Self-pay | Admitting: Family Medicine

## 2017-06-15 VITALS — BP 110/66 | HR 71 | Temp 97.6°F | Resp 15 | Ht 64.0 in | Wt 119.0 lb

## 2017-06-15 DIAGNOSIS — S39012A Strain of muscle, fascia and tendon of lower back, initial encounter: Secondary | ICD-10-CM

## 2017-06-15 DIAGNOSIS — M5417 Radiculopathy, lumbosacral region: Secondary | ICD-10-CM | POA: Diagnosis not present

## 2017-06-15 MED ORDER — PREDNISONE 20 MG PO TABS: ORAL_TABLET | ORAL | 0 refills | Status: DC

## 2017-06-15 MED ORDER — HYDROCODONE-ACETAMINOPHEN 5-325 MG PO TABS: 1.0000 | ORAL_TABLET | Freq: Three times a day (TID) | ORAL | 0 refills | Status: AC | PRN

## 2017-06-15 MED ORDER — CYCLOBENZAPRINE HCL 10 MG PO TABS: 10.0000 mg | ORAL_TABLET | Freq: Three times a day (TID) | ORAL | 0 refills | Status: DC | PRN

## 2017-06-15 NOTE — Progress Notes (Signed)
Patient ID: Kim Ortega, female    DOB: 1955-03-15, 62 y.o.   MRN: [id number]  PCP: [name], MD  Chief Complaint  Patient presents with  . Lumosacral radiculopathy    Recurrent low back pain. Patien has c/o of spasms.   Subjective:   Kim Ortega is a 62 y.o. female, presents to clinic with CC of continued back pain that is not improved since last visit.  HPI of injury from 06/08/2017 as listed below.  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  She has run out of muscle relaxers and pain medication, muscle spasms have returned and are causing a lot of stiffness and shooting pain.  Pain continues to be across her low back, worse on the left side with some radiation down her leg without any associated numbness or weakness.  At its worst without any medication, which is what she has had today and yesterday, just walking or any movement is causing it to be more severe and recurrent.  She denies any new incontinence or saddle anesthesia.  Is continue to use over-the-counter medications that she can tolerate, heating pad while at work and at home.    Per chart review was able to find MRI from 09/29/2015 which showed degenerative disc disease with disc height loss from T11-12, disc desiccation at L3-4, L4-5, and L5-S1.  L2-L3 mild broad-based disc bulge with bilateral mild lateral recess narrowing, and mild broad-based disc bulges at L3-4 and L4-5.  She did see Dr. at [facility], she has not reached out to him about her back pain.  06/08/17: EDIA PURSIFULL is a 62 y.o. female, presents to clinic with CC of left low back strain that began 4 days ago after attempting to move her king-size mattress and put a bed skirt underneath.  Pain is been constant, severe, located to  her left low back with radiation to her left buttocks and down her leg, exacerbated with periods of immobility such as sleeping at night and attempting to get up in the morning, alleviated by movement for part of the day however by the end of the day pain again becomes more severe acutely worsens when she is stepping on her left leg.  She has a history of bulging disks, is managed by [facility], she was previously given Zanaflex, and she attempted to use 1 of her old Zanaflex pills that she believes is expired and it did not help her pain much.  She took Tylenol today and also did not touch her pain.  She denies any numbness, tingling, weakness, genital numbness, incontinence of stool or urine.  She did begin to have muscle spasms this morning which is also exacerbated to pain.  Is of a low back brace but she was unable to find it today.  Attempted gentle stretching but her pain was too severe in her muscles to tighten spasm and to continue and she got no relief from that.    Patient Active Problem List   Diagnosis Date Noted  . Chemotherapy-induced peripheral neuropathy ([place]) 11/25/2015  . Antineoplastic chemotherapy induced anemia 08/27/2015  . Breast cancer of lower-inner quadrant of left female breast ([place]) 07/03/2015  . Left carotid artery stenosis   . Psoriasis   .  Hormone replacement therapy   . Smoker   . Episcleritis      Prior to Admission medications   Medication Sig Start Date End Date Taking? Authorizing Provider  ALPRAZolam Duanne Moron) 0.5 MG tablet Take 0.5 mg by mouth 3 (three) times daily as needed for anxiety.   Yes [provider]  aspirin EC 81 MG tablet Take 81 mg by mouth daily.   Yes [provider]  atenolol (TENORMIN) 50 MG tablet Take 1 tablet (50 mg total) by mouth daily. 01/24/17  Yes [name], MD  atorvastatin (LIPITOR) 20 MG tablet Take 1 tablet (20 mg total) by mouth daily. 10/25/16  Yes [name], MD  Calcium  Carbonate-Vitamin D (CALCIUM-D PO) Take 1 tablet by mouth 2 (two) times daily.    Yes [provider]  cetirizine (ZYRTEC) 10 MG tablet Take 10 mg by mouth daily.   Yes [provider]  clobetasol (OLUX) 0.05 % topical foam Apply 1 application topically daily as needed (psoriasis).  07/10/15  Yes [provider]  fluticasone (FLONASE) 50 MCG/ACT nasal spray SPRAY TWICE INTO EACH NOSTRIL EVERY DAY 08/26/16  Yes [name], MD  hydrochlorothiazide (HYDRODIURIL) 25 MG tablet Take 1 tablet (25 mg total) by mouth daily. 02/08/17  Yes [name], MD  ibuprofen (ADVIL,MOTRIN) 200 MG tablet Take 400 mg by mouth every 6 (six) hours as needed for headache (pain).   Yes [provider]  Ketotifen Fumarate (ALLERGY EYE DROPS OP) Place 1 drop into both eyes 2 (two) times daily as needed (allergies).    Yes [provider]  letrozole [facility]) 2.5 MG tablet Take 1 tablet (2.5 mg total) daily by mouth. 11/19/16  Yes [name], MD  losartan (COZAAR) 50 MG tablet TAKE 1 TABLET BY MOUTH ONCE DAILY. 04/18/17  Yes [name], MD  Multiple Vitamin (MULTIVITAMIN WITH MINERALS) TABS tablet Take 1 tablet by mouth daily.   Yes [provider]  cyclobenzaprine (FLEXERIL) 10 MG tablet Take 1 tablet (10 mg total) by mouth 3 (three) times daily as needed for muscle spasms. 06/15/17   Delsa Grana, PA-C  HYDROcodone-acetaminophen (NORCO/VICODIN) 5-325 MG tablet Take 1-2 tablets by mouth 3 (three) times daily as needed for up to 7 days for severe pain. 06/15/17 06/22/17  Delsa Grana, PA-C  predniSONE (DELTASONE) 20 MG tablet Take 2 daily for 5 days, then 1 daily for 5 days 06/15/17   Delsa Grana, PA-C     No Known Allergies   Family History  Problem Relation Age of Onset  . Stroke Mother      Social History   Socioeconomic History  . Marital status: Married    Spouse name: Not on file  . Number of children: Not on file  . Years of education: Not  on file  . Highest education level: Not on file  Occupational History  . Not on file  Social Needs  . Financial resource strain: Not on file  . Food insecurity:    Worry: Not on file    Inability: Not on file  . Transportation needs:    Medical: Not on file    Non-medical: Not on file  Tobacco Use  . Smoking status: Current Every Day Smoker    Packs/day: 0.50    Years: 20.00    Pack years: 10.00    Types: Cigarettes  . Smokeless tobacco: Never Used  Substance and Sexual Activity  . Alcohol use: Yes    Alcohol/week: 6.0  oz    Types: 10 Glasses of wine per week    Comment: Glasses of wine several times a week  . Drug use: No  . Sexual activity: Yes    Birth control/protection: Surgical    Comment: married  Lifestyle  . Physical activity:    Days per week: Not on file    Minutes per session: Not on file  . Stress: Not on file  Relationships  . Social connections:    Talks on phone: Not on file    Gets together: Not on file    Attends religious service: Not on file    Active member of club or organization: Not on file    Attends meetings of clubs or organizations: Not on file    Relationship status: Not on file  . Intimate partner violence:    Fear of current or ex partner: Not on file    Emotionally abused: Not on file    Physically abused: Not on file    Forced sexual activity: Not on file  Other Topics Concern  . Not on file  Social History Narrative  . Not on file     Review of Systems  Constitutional: Negative.   HENT: Negative.   Eyes: Negative.   Respiratory: Negative.   Cardiovascular: Negative.   Gastrointestinal: Negative.  Negative for abdominal distention, abdominal pain, constipation and diarrhea.  Endocrine: Negative.   Genitourinary: Negative.  Negative for decreased urine volume, difficulty urinating, dysuria, enuresis, flank pain, frequency, hematuria and urgency.  Musculoskeletal: Positive for arthralgias, back pain, gait problem and  myalgias. Negative for joint swelling, neck pain and neck stiffness.  Skin: Negative.  Negative for color change.  Allergic/Immunologic: Negative.   Neurological: Negative for tremors, weakness and numbness.  Hematological: Negative.   All other systems reviewed and are negative.      Objective:    Vitals:   06/15/17 1539  BP: 110/66  Pulse: 71  Resp: 15  Temp: 97.6 F (36.4 C)  TempSrc: Oral  SpO2: 98%  Weight: 119 lb (54 kg)  Height: 5\' 4"  (1.626 m)      Physical Exam  Constitutional: She is oriented to person, place, and time. She appears well-developed and well-nourished.  Non-toxic appearance. She does not have a sickly appearance. She does not appear ill. No distress.  Well-appearing female, appears stated age, appears uncomfortable and stiff, frequently repositioning in chair and wincing with pain, no distress, nontoxic-appearing  HENT:  Head: Normocephalic and atraumatic.  Right Ear: External ear normal.  Left Ear: External ear normal.  Nose: Nose normal.  Eyes: Pupils are equal, round, and reactive to light. Conjunctivae are normal. Right eye exhibits no discharge. Left eye exhibits no discharge.  Neck: Phonation normal. Neck supple. No spinous process tenderness and no muscular tenderness present. No tracheal deviation, no edema and no erythema present.  Cardiovascular: Normal rate, regular rhythm and intact distal pulses.  Pulmonary/Chest: Effort normal. No stridor. No respiratory distress.  Abdominal: She exhibits no distension.  Musculoskeletal: She exhibits tenderness. She exhibits no deformity.       Cervical back: Normal.       Thoracic back: Normal.       Lumbar back: She exhibits decreased range of motion, tenderness and pain. She exhibits no bony tenderness, no swelling, no edema, no deformity and no spasm.  No midline spinal tenderness from cervical to lumbar spine, no step-offs Left lumbar paraspinal muscle tenderness to palpation, no spasms palpated  in a standing or  sitting position.  Patient has decreased range of motion of her back secondary to pain and muscle spasms.   Left SI joint tenderness to palpation   Neurological: She is alert and oriented to person, place, and time. She has normal strength. No sensory deficit. She exhibits normal muscle tone. Gait abnormal. Coordination normal.  Antalgic gait Normal sensation to light touch bilaterally in lower extremities, normal sharp dull sensation to bilateral lower extremities 5/5 bilateral dorsiflexion and plantarflexion  Skin: Skin is warm and dry. No rash noted. She is not diaphoretic. No erythema.  Psychiatric: She has a normal mood and affect. Her behavior is normal.  Nursing note and vitals reviewed.         Assessment & Plan:      ICD-10-CM   1. Low back strain, initial encounter S39.012A cyclobenzaprine (FLEXERIL) 10 MG tablet    HYDROcodone-acetaminophen (NORCO/VICODIN) 5-325 MG tablet    DG Lumbar Spine Complete    Ambulatory referral to Physical Therapy  2. Lumbosacral radiculopathy M54.17 cyclobenzaprine (FLEXERIL) 10 MG tablet    HYDROcodone-acetaminophen (NORCO/VICODIN) 5-325 MG tablet    predniSONE (DELTASONE) 20 MG tablet    DG Lumbar Spine Complete    Ambulatory referral to Physical Therapy    Continued severe lumbosacral strain with radiculopathy down left leg, no neurological deficit or red flags on exam.  She has a working as a [employer] scientist, has taken all the Flexeril prescribed 7 days ago, also has taken all the pain medicines prescribed, the steroid burst gave her some improvement but when medications were completed her symptoms returned and are just as severe as initially.    I will refill medications and do a longer steroid taper, again told patient I would like to do at least a x-ray of the back to see if there are any changes with plain films.  Again encouraged her to be extremely light duty at work, no bending over, no lifting more than 5 pounds only with her  arms work note was provided today for that.  She is still so severely spasmed I do not feel that any stretching or physical therapy in the next couple days will help very much but did order PT, may help in the next 1 to 2 weeks.  Encouraged her to reach out to her orthopedic surgeon to see if he will evaluate her or if they would recommend following up with them.  Feel that follow-up with orthopedics would be more beneficial for the patient than to return here again with the same issue, discussed ability to do injections, nerve or muscle testing etc.  Patient verbalizes understanding of ER precautions.    Delsa Grana, PA-C 06/15/17 5:51 PM

## 2017-06-15 NOTE — Patient Instructions (Signed)
Will order PT and lumbar Xray  Call your orthopedic MD to advise about visit or follow up visit.

## 2017-06-22 ENCOUNTER — Ambulatory Visit
Admission: RE | Admit: 2017-06-22 | Discharge: 2017-06-22 | Disposition: A | Discharge status: Home / Self Care | Payer: BC Managed Care – PPO | Source: Ambulatory Visit | Attending: Family Medicine | Admitting: Family Medicine

## 2017-06-22 NOTE — Progress Notes (Signed)
Please notify pt that lumbar spine xray showed degenerative changes, disc spaces are normal, no fracture and misalignment.   It incidentally saw calcifications in aorta and iliac arteries suggestive of some peripheral artery disease.

## 2017-06-23 ENCOUNTER — Telehealth: Payer: Self-pay | Admitting: Family Medicine

## 2017-06-23 MED ORDER — DICLOFENAC SODIUM 75 MG PO TBEC: 75.0000 mg | DELAYED_RELEASE_TABLET | Freq: Two times a day (BID) | ORAL | 0 refills | Status: DC

## 2017-06-23 NOTE — Telephone Encounter (Signed)
Patient called stating that she went for her first day of PT on yesterday and she stated that she is in a lot of pain today. Patient states that she is almost out of hydrocodone, and flexeril and would like to know if she may get a refill. Please advise?

## 2017-06-23 NOTE — Telephone Encounter (Signed)
Please call pt- reassure that worsening pain after PT is normal as long as it goes back to her baseline pain within 24 hours.  Her narcotic and muscle relaxer amount is high, and to me that is concerning for something that may need imaging sooner or spine referral.  Also, we need to be using NSAIDs and tylenol first line for pain because long-term pain management with narcotics is more dangerous and of NSAIDs.  So I would want to see her again or get some more tests before prescribing more opiods.  Or with her back hx, she can go to spine Dr. If she's established with one.  They have other procedures they can do, that we cannot.  I have sent diclofenac through to pharmacy.  She needs to be using a low back brace, be avoiding all bending over and lifting, frequently change positions, use heating pad.    Back pain will likely last for 1-2 months and be a very gradual process.  If she cannot tolerate Pt, or if pain is becoming more severe.  I need to order an MRI or get her to spine.

## 2017-06-24 NOTE — Telephone Encounter (Signed)
Left message return call

## 2017-06-24 NOTE — Telephone Encounter (Signed)
Spoke with patient and informed her per Hinda Glatter- reassure that worsening pain after PT is normal as long as it goes back to her baseline pain within 24 hours. Her narcotic and muscle relaxer amount is high, and to me that is concerning for something that may need imaging sooner or spine referral. Also, we need to be using NSAIDs and tylenol first line for pain because long-term pain management with narcotics is more dangerous and of NSAIDs. So I would want to see her again or get some more tests before prescribing more opiods. Or with her back hx, she can go to spine Dr. If she's established with one. They have other procedures they can do, that we cannot.   I have sent diclofenac through to pharmacy. She needs to be using a low back brace, be avoiding all bending over and lifting, frequently change positions, use heating pad.    Back pain will likely last for 1-2 months and be a very gradual process. If she cannot tolerate Pt, or if pain is becoming more severe. I need to order an MRI or get her to spine.  Patient verbalized understanding.

## 2017-07-04 ENCOUNTER — Ambulatory Visit: Payer: BC Managed Care – PPO | Admitting: Family Medicine

## 2017-07-04 ENCOUNTER — Encounter: Payer: Self-pay | Admitting: Family Medicine

## 2017-07-04 VITALS — BP 118/66 | HR 78 | Temp 98.0°F | Resp 14 | Ht 64.0 in | Wt 117.0 lb

## 2017-07-04 DIAGNOSIS — I700 Atherosclerosis of aorta: Secondary | ICD-10-CM | POA: Diagnosis not present

## 2017-07-04 DIAGNOSIS — M5417 Radiculopathy, lumbosacral region: Secondary | ICD-10-CM

## 2017-07-04 DIAGNOSIS — I6522 Occlusion and stenosis of left carotid artery: Secondary | ICD-10-CM

## 2017-07-04 MED ORDER — HYDROCODONE-ACETAMINOPHEN 5-325 MG PO TABS: 1.0000 | ORAL_TABLET | Freq: Four times a day (QID) | ORAL | 0 refills | Status: DC | PRN

## 2017-07-04 MED ORDER — ATORVASTATIN CALCIUM 40 MG PO TABS
40.0000 mg | ORAL_TABLET | Freq: Every day | ORAL | 3 refills | Status: DC
Start: 2017-07-04 — End: 2018-07-21

## 2017-07-04 NOTE — Progress Notes (Signed)
Subjective:    Patient ID: Kim Ortega, female    DOB: 06/01/1955, 62 y.o.   MRN: [id number]  HPI   02/21/17 Blood pressure is much better today at 110/64.  Patient has been getting similar readings at home suggesting that her previous office visits 200/110 was artificially elevated likely due to cold medication.  She denies any dizziness or near syncope.  She denies any chest pain shortness of breath or dyspnea on exertion.  At that time, my plan was: Blood pressure today is much better.  Continue hydrochlorothiazide at the present time.  Patient will monitor her blood pressure at home and if blood pressure drops further, we will reduce dose of hydrochlorothiazide.  Continue to encourage smoking cessation.  07/04/17 Patient presents today with 2 major complaints.  First she has been complaining of low back pain for more than 2 years.  She had an MRI in 2017 which revealed mild bulging disc at L2-L3, L3-L4, and L4-L5 but no significant neural impingement.  At the time the patient was undergoing chemotherapy for breast cancer and therefore she did not receive an epidural steroid injection.  Instead she went to physical therapy.  She states that over the last 6 weeks, the back pain has intensified.  She has pain radiating from her lower back down into her left gluteus around to the front of her anterior left thigh down her leg to the knee and the pattern of the L3 dermatome.  She complains of constant low back pain that is particularly worse with standing and walking.  It is better if she lays flat on her back on her heating pad.  She has tried diclofenac with no relief.  She is tried Zanaflex with no relief.  She gets mild relief with Norco which she uses sparingly for severe pain.  She has seen my partner on 2 separate occasions in the month of June.  X-rays did not show any significant findings related to her back pain although there was a coincidental finding of arthrosclerosis in the distal aorta.   Pain is getting worse and now the patient would like to have epidural steroid injections performed.  Her second issue is the coincidental finding of atherosclerosis in the distal aorta and iliac artery.  She has known left carotid artery stenosis less than 50%.  She is on aspirin.  Her blood pressure is now well controlled at 118/66.  She is on low intensity statin and Lipitor 20 mg a day.  She denies any symptoms of claudication. Past Medical History:  Diagnosis Date  . Allergy   . Anxiety    mild no regular meds  . Arthritis    psoriatic arthritis -back  . Breast cancer ([place]) 06/25/15 bx   left breastinvasive ductal ca ,grade 1  . Episcleritis   . Hormone replacement therapy   . Hypertension   . Left carotid artery stenosis    40-59% (01/2015)  . Personal history of chemotherapy 2017   neoadjuvant  . Personal history of radiation therapy 2018  . Psoriasis   . Smoker    Past Surgical History:  Procedure Laterality Date  . ABDOMINAL HYSTERECTOMY    . BREAST LUMPECTOMY WITH RADIOACTIVE SEED AND SENTINEL LYMPH NODE BIOPSY Left 01/08/2016   Procedure: LEFT BREAST LUMPECTOMY WITH RADIOACTIVE SEED AND SENTINEL LYMPH NODE BIOPSY, BLUE DYE INJECTION;  Surgeon: Rolm Bookbinder, MD;  Location: [place];  Service: General;  Laterality: Left;  . cervical acdf  C5-6- fusion with plates/screws "Dr. Sherwood Gambler" '12  . COLONOSCOPY WITH PROPOFOL N/A 04/16/2014   Procedure: COLONOSCOPY WITH PROPOFOL;  Surgeon: Garlan Fair, MD;  Location: [facility];  Service: Endoscopy;  Laterality: N/A;  . COLPOSCOPY    . laporotomy     Ovarian cyst  . PORT-A-CATH REMOVAL Right 01/08/2016   Procedure: REMOVAL PORT-A-CATH;  Surgeon: Rolm Bookbinder, MD;  Location: [place];  Service: General;  Laterality: Right;  . PORTACATH PLACEMENT N/A 07/24/2015   Procedure: INSERTION PORT-A-CATH WITH [place];  Surgeon: Rolm Bookbinder, MD;  Location: [place];  Service: General;  Laterality:  N/A;  . TONSILLECTOMY     child   No Known Allergies Current Outpatient Medications on File Prior to Visit  Medication Sig Dispense Refill  . ALPRAZolam (XANAX) 0.5 MG tablet Take 0.5 mg by mouth 3 (three) times daily as needed for anxiety.    [name] aspirin EC 81 MG tablet Take 81 mg by mouth daily.    [name] atenolol (TENORMIN) 50 MG tablet Take 1 tablet (50 mg total) by mouth daily. 90 tablet 3  . atorvastatin (LIPITOR) 20 MG tablet Take 1 tablet (20 mg total) by mouth daily. 90 tablet 3  . Calcium Carbonate-Vitamin D (CALCIUM-D PO) Take 1 tablet by mouth 2 (two) times daily.     . cetirizine (ZYRTEC) 10 MG tablet Take 10 mg by mouth daily.    . clobetasol (OLUX) 0.05 % topical foam Apply 1 application topically daily as needed (psoriasis).   2  . diclofenac (VOLTAREN) 75 MG EC tablet Take 1 tablet (75 mg total) by mouth 2 (two) times daily. 30 tablet 0  . fluticasone (FLONASE) 50 MCG/ACT nasal spray SPRAY TWICE INTO EACH NOSTRIL EVERY DAY 16 g 11  . hydrochlorothiazide (HYDRODIURIL) 25 MG tablet Take 1 tablet (25 mg total) by mouth daily. 90 tablet 3  . ibuprofen (ADVIL,MOTRIN) 200 MG tablet Take 400 mg by mouth every 6 (six) hours as needed for headache (pain).    . Ketotifen Fumarate (ALLERGY EYE DROPS OP) Place 1 drop into both eyes 2 (two) times daily as needed (allergies).     [name] letrozole (FEMARA) 2.5 MG tablet Take 1 tablet (2.5 mg total) daily by mouth. 90 tablet 3  . losartan (COZAAR) 50 MG tablet TAKE 1 TABLET BY MOUTH ONCE DAILY. 90 tablet 3  . Multiple Vitamin (MULTIVITAMIN WITH MINERALS) TABS tablet Take 1 tablet by mouth daily.     No current facility-administered medications on file prior to visit.    Social History   Socioeconomic History  . Marital status: Married    Spouse name: Not on file  . Number of children: Not on file  . Years of education: Not on file  . Highest education level: Not on file  Occupational History  . Not on file  Social Needs  . Financial resource  strain: Not on file  . Food insecurity:    Worry: Not on file    Inability: Not on file  . Transportation needs:    Medical: Not on file    Non-medical: Not on file  Tobacco Use  . Smoking status: Current Every Day Smoker    Packs/day: 0.50    Years: 20.00    Pack years: 10.00    Types: Cigarettes  . Smokeless tobacco: Never Used  Substance and Sexual Activity  . Alcohol use: Yes    Alcohol/week: 6.0 oz    Types: 10 Glasses of wine per week  Comment: Glasses of wine several times a week  . Drug use: No  . Sexual activity: Yes    Birth control/protection: Surgical    Comment: married  Lifestyle  . Physical activity:    Days per week: Not on file    Minutes per session: Not on file  . Stress: Not on file  Relationships  . Social connections:    Talks on phone: Not on file    Gets together: Not on file    Attends religious service: Not on file    Active member of club or organization: Not on file    Attends meetings of clubs or organizations: Not on file    Relationship status: Not on file  . Intimate partner violence:    Fear of current or ex partner: Not on file    Emotionally abused: Not on file    Physically abused: Not on file    Forced sexual activity: Not on file  Other Topics Concern  . Not on file  Social History Narrative  . Not on file     Review of Systems  All other systems reviewed and are negative.      Objective:   Physical Exam  Constitutional: She is oriented to person, place, and time. She appears well-developed and well-nourished. No distress.  HENT:  Right Ear: Tympanic membrane and ear canal normal.  Left Ear: Tympanic membrane and ear canal normal.  Nose: No mucosal edema or rhinorrhea. Right sinus exhibits no maxillary sinus tenderness. Left sinus exhibits no maxillary sinus tenderness.  Mouth/Throat: Oropharynx is clear and moist. No oropharyngeal exudate.  Cardiovascular: Normal rate, regular rhythm and normal heart sounds.  No  murmur heard. Pulmonary/Chest: Effort normal and breath sounds normal. No respiratory distress. She has no wheezes. She has no rales.  Musculoskeletal:       Lumbar back: She exhibits decreased range of motion, tenderness and pain. She exhibits no bony tenderness.       Back:       Legs: Neurological: She is alert and oriented to person, place, and time. No cranial nerve deficit. She exhibits normal muscle tone. Coordination normal.  Skin: She is not diaphoretic.  Vitals reviewed.         Assessment & Plan:  Lumbosacral radiculopathy  Left carotid artery stenosis  Atherosclerosis of aorta (HCC)  I will schedule the patient for repeat MRI of the lower back/lumbar spine given the worsening pain in the failure to be improved with conservative therapy over more than 6 weeks.  If insurance will not improve the MRI, I will consult interventional radiology at [facility] radiology for an attempted epidural steroid injection between L3 and L4 based on the patient's pain location and distribution as this seemingly mimics left L3 lumbar radiculopathy.  I explained the atherosclerosis she is a coincidental finding.  I recommended smoking cessation.  I recommended switching to high intensity statin, Lipitor 40 mg a day and I recommended continuing aspirin 81 mg a day and excellent blood pressure control

## 2017-07-06 ENCOUNTER — Other Ambulatory Visit: Payer: Self-pay

## 2017-07-06 ENCOUNTER — Ambulatory Visit: Payer: BC Managed Care – PPO | Admitting: Family Medicine

## 2017-07-06 ENCOUNTER — Encounter: Payer: Self-pay | Admitting: Family Medicine

## 2017-07-06 VITALS — BP 120/62 | HR 86 | Temp 98.3°F | Resp 16 | Ht 64.0 in | Wt 117.0 lb

## 2017-07-06 DIAGNOSIS — L600 Ingrowing nail: Secondary | ICD-10-CM

## 2017-07-06 NOTE — Patient Instructions (Signed)
Take the amoxicillin for 5 days

## 2017-07-06 NOTE — Progress Notes (Signed)
   Subjective:    Patient ID: Kim Ortega, female    DOB: 12-18-1955, 62 y.o.   MRN: [id number]  Patient presents for B Great Toe Edema (states that she has swelling and redness to B great toes- states that L side did have some infection around nail)   Pt here with swelling around her toes.  She was seen 2 days ago by PCP after she been evaluated for back pain over the past month or so.  MRI was to be scheduled.  She also had incidental atherosclerosis noted.  She was on statin drug already this was increased to 40 mg.  She states that she went home and started thinking about any signs of circulation issues she noted that both her great toes seem inflamed.  He does admit that she had pus pocket on the side of an ingrown toe on the left foot last week.  She has been trying to keep clean with peroxide but then today she also noted that the right great toe seems a bit swollen and she became concerned and came to for visit.  There is been no significant color change.  No injury to the lower extremities.    Review Of Systems:  GEN- denies fatigue, fever, weight loss,weakness, recent illness HEENT- denies eye drainage, change in vision, nasal discharge, CVS- denies chest pain, palpitations RESP- denies SOB, cough, wheeze ABD- denies N/V, change in stools, abd pain GU- denies dysuria, hematuria, dribbling, incontinence MSK- denies joint pain, muscle aches, injury Neuro- denies headache, dizziness, syncope, seizure activity       Objective:    BP 120/62   Pulse 86   Temp 98.3 F (36.8 C) (Oral)   Resp 16   Ht 5\' 4"  (1.626 m)   Wt 117 lb (53.1 kg)   SpO2 100%   BMI 20.08 kg/m  GEN- NAD, alert and oriented x3 Ext- Feet warm to touch, no cyanosis. No significant swelling of eitehr toe, yellow dry discharge at lateral aspect of right great toenail, mild TTP, psoriatic nail changes  Pulse- DP, PT, Radial 2+ normal cap refill toes         Assessment & Plan:      Problem List Items  Addressed This Visit    None    Visit Diagnoses    Ingrown toenail of left foot    -  Primary   Given reassurance no sign of vascualr compromise or significant swelling of toes, she does have ingrown nail with mild infection she gets these all the time because of her psoriasis.  She has amoxicillin at home advised that she can take twice a day for about 5 days continue soaking her toe.  Nothing else needs to be done at this time.  Of note she does have psoriasis she may have psoriatic arthritis with regards to her back pain.  We will see what the MRI shows.  She was seen Dr. Bronson Curb  sure has not been seen in about 2 to 3 years      Note: This dictation was prepared with Dragon dictation along with smaller phrase technology. Any transcriptional errors that result from this process are unintentional.

## 2017-07-11 ENCOUNTER — Ambulatory Visit
Admission: RE | Admit: 2017-07-11 | Discharge: 2017-07-11 | Disposition: A | Discharge status: Home / Self Care | Payer: BC Managed Care – PPO | Source: Ambulatory Visit | Attending: Family Medicine | Admitting: Family Medicine

## 2017-07-11 DIAGNOSIS — M5417 Radiculopathy, lumbosacral region: Secondary | ICD-10-CM

## 2017-07-12 DIAGNOSIS — M5416 Radiculopathy, lumbar region: Secondary | ICD-10-CM

## 2017-07-13 ENCOUNTER — Other Ambulatory Visit: Payer: Self-pay | Admitting: Family Medicine

## 2017-07-13 DIAGNOSIS — M5416 Radiculopathy, lumbar region: Secondary | ICD-10-CM

## 2017-07-15 ENCOUNTER — Ambulatory Visit
Admission: RE | Admit: 2017-07-15 | Discharge: 2017-07-15 | Disposition: A | Discharge status: Home / Self Care | Payer: BC Managed Care – PPO | Source: Ambulatory Visit | Attending: Family Medicine | Admitting: Family Medicine

## 2017-07-15 DIAGNOSIS — M5416 Radiculopathy, lumbar region: Secondary | ICD-10-CM

## 2017-07-15 MED ORDER — IOPAMIDOL (ISOVUE-M 200) INJECTION 41%: 20.0000 mL | Freq: Once | INTRAMUSCULAR | Status: DC

## 2017-07-15 MED ORDER — METHYLPREDNISOLONE ACETATE 40 MG/ML INJ SUSP (RADIOLOG
120.0000 mg | Freq: Once | INTRAMUSCULAR | Status: AC
  Administered 2017-07-15: 120 mg via EPIDURAL

## 2017-07-15 MED ORDER — IOPAMIDOL (ISOVUE-M 200) INJECTION 41%
1.0000 mL | Freq: Once | INTRAMUSCULAR | Status: AC
  Administered 2017-07-15: 1 mL via EPIDURAL

## 2017-07-22 ENCOUNTER — Encounter: Payer: Self-pay | Admitting: Family Medicine

## 2017-07-25 ENCOUNTER — Telehealth: Payer: Self-pay | Admitting: Family Medicine

## 2017-07-25 ENCOUNTER — Other Ambulatory Visit: Payer: Self-pay | Admitting: Family Medicine

## 2017-07-25 DIAGNOSIS — M5416 Radiculopathy, lumbar region: Secondary | ICD-10-CM

## 2017-07-25 NOTE — Telephone Encounter (Signed)
Per patient and additional referral had to be placed for additional injections by Interventional radiology. Referral placed.

## 2017-07-29 ENCOUNTER — Other Ambulatory Visit: Payer: Self-pay | Admitting: Hematology and Oncology

## 2017-07-29 DIAGNOSIS — Z9889 Other specified postprocedural states: Secondary | ICD-10-CM

## 2017-08-04 ENCOUNTER — Other Ambulatory Visit: Payer: Self-pay | Admitting: Family Medicine

## 2017-08-09 ENCOUNTER — Ambulatory Visit
Admission: RE | Admit: 2017-08-09 | Discharge: 2017-08-09 | Disposition: A | Discharge status: Home / Self Care | Payer: BC Managed Care – PPO | Source: Ambulatory Visit | Attending: Family Medicine | Admitting: Family Medicine

## 2017-08-09 DIAGNOSIS — M5416 Radiculopathy, lumbar region: Secondary | ICD-10-CM

## 2017-08-09 MED ORDER — METHYLPREDNISOLONE ACETATE 40 MG/ML INJ SUSP (RADIOLOG
120.0000 mg | Freq: Once | INTRAMUSCULAR | Status: AC
  Administered 2017-08-09: 120 mg via EPIDURAL

## 2017-08-09 MED ORDER — IOPAMIDOL (ISOVUE-M 200) INJECTION 41%
1.0000 mL | Freq: Once | INTRAMUSCULAR | Status: AC
  Administered 2017-08-09: 1 mL via EPIDURAL

## 2017-08-09 NOTE — Discharge Instructions (Signed)

## 2017-08-22 ENCOUNTER — Encounter: Payer: Self-pay | Admitting: Family Medicine

## 2017-08-22 DIAGNOSIS — M5417 Radiculopathy, lumbosacral region: Secondary | ICD-10-CM

## 2017-08-22 DIAGNOSIS — M5416 Radiculopathy, lumbar region: Secondary | ICD-10-CM

## 2017-08-26 ENCOUNTER — Other Ambulatory Visit: Payer: Self-pay | Admitting: Family Medicine

## 2017-08-26 DIAGNOSIS — I6523 Occlusion and stenosis of bilateral carotid arteries: Secondary | ICD-10-CM

## 2017-08-29 ENCOUNTER — Ambulatory Visit
Admission: RE | Admit: 2017-08-29 | Discharge: 2017-08-29 | Disposition: A | Discharge status: Home / Self Care | Payer: BC Managed Care – PPO | Source: Ambulatory Visit | Attending: Hematology and Oncology | Admitting: Hematology and Oncology

## 2017-08-29 DIAGNOSIS — Z9889 Other specified postprocedural states: Secondary | ICD-10-CM

## 2017-09-20 ENCOUNTER — Ambulatory Visit
Admission: RE | Admit: 2017-09-20 | Discharge: 2017-09-20 | Disposition: A | Discharge status: Home / Self Care | Payer: BC Managed Care – PPO | Source: Ambulatory Visit | Attending: Family Medicine | Admitting: Family Medicine

## 2017-09-20 DIAGNOSIS — M5416 Radiculopathy, lumbar region: Secondary | ICD-10-CM

## 2017-09-20 DIAGNOSIS — M5417 Radiculopathy, lumbosacral region: Secondary | ICD-10-CM

## 2017-09-20 MED ORDER — METHYLPREDNISOLONE ACETATE 40 MG/ML INJ SUSP (RADIOLOG
120.0000 mg | Freq: Once | INTRAMUSCULAR | Status: AC
  Administered 2017-09-20: 120 mg via EPIDURAL

## 2017-09-20 MED ORDER — IOPAMIDOL (ISOVUE-M 200) INJECTION 41%
1.0000 mL | Freq: Once | INTRAMUSCULAR | Status: AC
  Administered 2017-09-20: 1 mL via EPIDURAL

## 2017-09-20 NOTE — Discharge Instructions (Signed)

## 2017-09-26 ENCOUNTER — Ambulatory Visit (HOSPITAL_COMMUNITY)
Admission: RE | Admit: 2017-09-26 | Discharge: 2017-09-26 | Disposition: A | Discharge status: Home / Self Care | Payer: BC Managed Care – PPO | Source: Ambulatory Visit | Attending: Cardiovascular Disease | Admitting: Cardiovascular Disease

## 2017-09-26 DIAGNOSIS — I6523 Occlusion and stenosis of bilateral carotid arteries: Secondary | ICD-10-CM | POA: Insufficient documentation

## 2017-12-03 ENCOUNTER — Other Ambulatory Visit: Payer: Self-pay | Admitting: Hematology and Oncology

## 2017-12-13 ENCOUNTER — Encounter: Payer: Self-pay | Admitting: Family Medicine

## 2017-12-13 ENCOUNTER — Ambulatory Visit: Payer: BC Managed Care – PPO | Admitting: Family Medicine

## 2017-12-13 VITALS — BP 144/80 | HR 82 | Temp 98.5°F | Resp 14 | Ht 64.0 in | Wt 117.0 lb

## 2017-12-13 DIAGNOSIS — L03031 Cellulitis of right toe: Secondary | ICD-10-CM

## 2017-12-13 MED ORDER — DOXYCYCLINE HYCLATE 100 MG PO TABS: 100.0000 mg | ORAL_TABLET | Freq: Two times a day (BID) | ORAL | 0 refills | Status: DC

## 2017-12-13 NOTE — Progress Notes (Signed)
Subjective:    Patient ID: Kim Ortega, female    DOB: [birth year], 62 y.o.   MRN: [id number]  HPI Patient reports a swollen painful right great toe now for 2 to 3 months.  The toe distal to the MTP joint is slightly erythematous slightly violaceous in color.  Is slightly warm to touch with a delayed capillary refill.  There is some tenderness to palpation all throughout the toe although this is mild.  Patient has been treating it with warm salt water soaks with no improvement.  She reports pain with bearing weight in that area.  She denies any fevers or chills.  She denies any significant injury.  She has strong dorsalis pedis and posterior tibialis pulses in that foot.  The remainder of her toes are normal in color.  There is delayed capillary refill in that toe distal to the MTP joint Past Medical History:  Diagnosis Date  . Allergy   . Anxiety    mild no regular meds  . Arthritis    psoriatic arthritis -back  . Breast cancer ([place]) 06/25/15 bx   left breastinvasive ductal ca ,grade 1  . Episcleritis   . Hormone replacement therapy   . Hypertension   . Left carotid artery stenosis    40-59% (01/2015)  . Personal history of chemotherapy 2017   neoadjuvant  . Personal history of radiation therapy 2018  . Psoriasis   . Smoker    Past Surgical History:  Procedure Laterality Date  . ABDOMINAL HYSTERECTOMY    . BREAST LUMPECTOMY WITH RADIOACTIVE SEED AND SENTINEL LYMPH NODE BIOPSY Left 01/08/2016   Procedure: LEFT BREAST LUMPECTOMY WITH RADIOACTIVE SEED AND SENTINEL LYMPH NODE BIOPSY, BLUE DYE INJECTION;  Surgeon: Rolm Bookbinder, MD;  Location: [place];  Service: General;  Laterality: Left;  . cervical acdf     C5-6- fusion with plates/screws "Dr. Sherwood Gambler" '12  . COLONOSCOPY WITH PROPOFOL N/A 04/16/2014   Procedure: COLONOSCOPY WITH PROPOFOL;  Surgeon: Garlan Fair, MD;  [facility];  Service: Endoscopy;  Laterality: N/A;  . COLPOSCOPY    .  laporotomy     Ovarian cyst  . PORT-A-CATH REMOVAL Right 01/08/2016   Procedure: REMOVAL PORT-A-CATH;  Surgeon: Rolm Bookbinder, MD;  Location: [place];  Service: General;  Laterality: Right;  . PORTACATH PLACEMENT N/A 07/24/2015   Procedure: INSERTION PORT-A-CATH WITH [place];  Surgeon: Rolm Bookbinder, MD;  Location: [place];  Service: General;  Laterality: N/A;  . TONSILLECTOMY     child   No Known Allergies Current Outpatient Medications on File Prior to Visit  Medication Sig Dispense Refill  . ALPRAZolam (XANAX) 0.5 MG tablet Take 0.5 mg by mouth 3 (three) times daily as needed for anxiety.    [name] aspirin EC 81 MG tablet Take 81 mg by mouth daily.    [name] atenolol (TENORMIN) 50 MG tablet Take 1 tablet (50 mg total) by mouth daily. 90 tablet 3  . atorvastatin (LIPITOR) 20 MG tablet Take 1 tablet (20 mg total) by mouth daily. 90 tablet 3  . atorvastatin (LIPITOR) 40 MG tablet Take 1 tablet (40 mg total) by mouth daily. 90 tablet 3  . Calcium Carbonate-Vitamin D (CALCIUM-D PO) Take 1 tablet by mouth 2 (two) times daily.     . cetirizine (ZYRTEC) 10 MG tablet Take 10 mg by mouth daily.    . clobetasol (OLUX) 0.05 % topical foam Apply 1 application topically daily as needed (psoriasis).   2  .  diclofenac (VOLTAREN) 75 MG EC tablet Take 1 tablet (75 mg total) by mouth 2 (two) times daily. 30 tablet 0  . fluticasone (FLONASE) 50 MCG/ACT nasal spray SPRAY TWICE INTO EACH NOSTRIL EVERY DAY 48 g 3  . hydrochlorothiazide (HYDRODIURIL) 25 MG tablet Take 1 tablet (25 mg total) by mouth daily. 90 tablet 3  . HYDROcodone-acetaminophen (NORCO) 5-325 MG tablet Take 1 tablet by mouth every 6 (six) hours as needed for moderate pain. 60 tablet 0  . ibuprofen (ADVIL,MOTRIN) 200 MG tablet Take 400 mg by mouth every 6 (six) hours as needed for headache (pain).    . Ketotifen Fumarate (ALLERGY EYE DROPS OP) Place 1 drop into both eyes 2 (two) times daily as needed (allergies).     [name] letrozole (FEMARA)  2.5 MG tablet TAKE 1 TABLET (2.5 MG TOTAL) DAILY BY MOUTH. 90 tablet 0  . losartan (COZAAR) 50 MG tablet TAKE 1 TABLET BY MOUTH ONCE DAILY. 90 tablet 3  . Multiple Vitamin (MULTIVITAMIN WITH MINERALS) TABS tablet Take 1 tablet by mouth daily.     No current facility-administered medications on file prior to visit.    Social History   Socioeconomic History  . Marital status: Married    Spouse name: Not on file  . Number of children: Not on file  . Years of education: Not on file  . Highest education level: Not on file  Occupational History  . Not on file  Social Needs  . Financial resource strain: Not on file  . Food insecurity:    Worry: Not on file    Inability: Not on file  . Transportation needs:    Medical: Not on file    Non-medical: Not on file  Tobacco Use  . Smoking status: Current Every Day Smoker    Packs/day: 0.50    Years: 20.00    Pack years: 10.00    Types: Cigarettes  . Smokeless tobacco: Never Used  Substance and Sexual Activity  . Alcohol use: Yes    Alcohol/week: 10.0 standard drinks    Types: 10 Glasses of wine per week    Comment: Glasses of wine several times a week  . Drug use: No  . Sexual activity: Yes    Birth control/protection: Surgical    Comment: married  Lifestyle  . Physical activity:    Days per week: Not on file    Minutes per session: Not on file  . Stress: Not on file  Relationships  . Social connections:    Talks on phone: Not on file    Gets together: Not on file    Attends religious service: Not on file    Active member of club or organization: Not on file    Attends meetings of clubs or organizations: Not on file    Relationship status: Not on file  . Intimate partner violence:    Fear of current or ex partner: Not on file    Emotionally abused: Not on file    Physically abused: Not on file    Forced sexual activity: Not on file  Other Topics Concern  . Not on file  Social History Narrative  . Not on file      Review of Systems  All other systems reviewed and are negative.      Objective:   Physical Exam  Constitutional: She is oriented to person, place, and time. She appears well-developed and well-nourished. No distress.  HENT:  Right Ear: Tympanic membrane and ear canal normal.  Left Ear: Tympanic membrane and ear canal normal.  Nose: No mucosal edema or rhinorrhea. Right sinus exhibits no maxillary sinus tenderness. Left sinus exhibits no maxillary sinus tenderness.  Mouth/Throat: Oropharynx is clear and moist. No oropharyngeal exudate.  Cardiovascular: Normal rate, regular rhythm and normal heart sounds.  No murmur heard. Pulmonary/Chest: Effort normal and breath sounds normal. No respiratory distress. She has no wheezes. She has no rales.  Musculoskeletal:       Right foot: There is tenderness and bony tenderness. There is normal range of motion, no swelling, no deformity and no laceration.       Feet:  Neurological: She is alert and oriented to person, place, and time. No cranial nerve deficit. She exhibits normal muscle tone. Coordination normal.  Skin: She is not diaphoretic.  Vitals reviewed.         Assessment & Plan:  Cellulitis of great toe of right foot - Plan: DG Foot Complete Right For initial diagnosis includes a mild cellulitis versus an underlying chronic smoldering source of inflammation such as a stress fracture versus chronic osteomyelitis.  Begin by obtaining an x-ray of the foot.  Meanwhile start the patient on doxycycline 100 mg p.o. twice daily for 10 days to cover for possible cellulitis.  If the x-ray shows signs consistent or concerning for osteomyelitis I would proceed with an MRI as well as vascular studies of the right foot although her pulses in the right foot are normal.  Await the results of the x-ray.  I suspect possibly there may be a stress fracture.

## 2017-12-14 ENCOUNTER — Ambulatory Visit
Admission: RE | Admit: 2017-12-14 | Discharge: 2017-12-14 | Disposition: A | Discharge status: Home / Self Care | Payer: BC Managed Care – PPO | Source: Ambulatory Visit | Attending: Family Medicine | Admitting: Family Medicine

## 2017-12-14 DIAGNOSIS — L03031 Cellulitis of right toe: Secondary | ICD-10-CM

## 2018-01-13 ENCOUNTER — Ambulatory Visit: Payer: BC Managed Care – PPO | Admitting: Family Medicine

## 2018-01-13 ENCOUNTER — Other Ambulatory Visit: Payer: Self-pay | Admitting: Family Medicine

## 2018-01-13 ENCOUNTER — Encounter: Payer: Self-pay | Admitting: Family Medicine

## 2018-01-13 VITALS — BP 162/100 | HR 72 | Temp 98.0°F | Resp 16 | Ht 64.0 in | Wt 118.0 lb

## 2018-01-13 DIAGNOSIS — M5416 Radiculopathy, lumbar region: Secondary | ICD-10-CM

## 2018-01-13 DIAGNOSIS — M79674 Pain in right toe(s): Secondary | ICD-10-CM | POA: Diagnosis not present

## 2018-01-13 MED ORDER — HYDROCODONE-ACETAMINOPHEN 5-325 MG PO TABS: 1.0000 | ORAL_TABLET | Freq: Four times a day (QID) | ORAL | 0 refills | Status: DC | PRN

## 2018-01-13 MED ORDER — LOSARTAN POTASSIUM 100 MG PO TABS: 50.0000 mg | ORAL_TABLET | Freq: Every day | ORAL | 3 refills | Status: DC

## 2018-01-13 MED ORDER — CYCLOBENZAPRINE HCL 10 MG PO TABS: 10.0000 mg | ORAL_TABLET | Freq: Three times a day (TID) | ORAL | 0 refills | Status: DC | PRN

## 2018-01-13 MED ORDER — PREDNISONE 20 MG PO TABS: ORAL_TABLET | ORAL | 0 refills | Status: DC

## 2018-01-13 NOTE — Progress Notes (Signed)
Subjective:    Patient ID: Kim Ortega, female    DOB: 06-26-1955, 63 y.o.   MRN: [id number]  HPI  12/13/17 Patient reports a swollen painful right great toe now for 2 to 3 months.  The toe distal to the MTP joint is slightly erythematous slightly violaceous in color.  Is slightly warm to touch with a delayed capillary refill.  There is some tenderness to palpation all throughout the toe although this is mild.  Patient has been treating it with warm salt water soaks with no improvement.  She reports pain with bearing weight in that area.  She denies any fevers or chills.  She denies any significant injury.  She has strong dorsalis pedis and posterior tibialis pulses in that foot.  The remainder of her toes are normal in color.  There is delayed capillary refill in that toe distal to the MTP joint.  At that time, y plan was: For initial diagnosis includes a mild cellulitis versus an underlying chronic smoldering source of inflammation such as a stress fracture versus chronic osteomyelitis.  Begin by obtaining an x-ray of the foot.  Meanwhile start the patient on doxycycline 100 mg p.o. twice daily for 10 days to cover for possible cellulitis.  If the x-ray shows signs consistent or concerning for osteomyelitis I would proceed with an MRI as well as vascular studies of the right foot although her pulses in the right foot are normal.  Await the results of the x-ray.  I suspect possibly there may be a stress fracture.  01/13/18 X-ray showed no fracture or stress fracture or arthritic changes.  There was also no evidence of osteomyelitis despite the fact the pain has been there for 2 months.  Patient has finished the antibiotics but has seen no change.  Patient continues to have pain distal to the right first MTP joint.  The pain encompasses the whole toe.  She describes the pain as a sharp stabbing pain.  At other times it feels like bee stings.  It comes and goes throughout the day.  The toe itself is  numb as well.  She has normal pulses in the right foot.  She has normal capillary refill.  There is no warmth or tenderness to palpation to the toe.  She has full range of motion in the MTP joint with no effusion.  Differential diagnosis includes gout, chronic osteomyelitis, or possibly just peripheral neuropathy which I am starting to believe is more likely the cause of her pain.  She also complains of worsening low back pain.  She has a history of lumbar radiculopathy which improved after her previous 3 epidural steroid injections.  However recently she is been doing more lifting around the home trying to help her husband who had surgery.  Now she is having severe pain in her lower back radiating into both posterior hip areas and also down her left leg into her left foot with standing.  She reports decreased range of motion and pain with full extension of the back.  She denies any falls or injury. Past Medical History:  Diagnosis Date  . Allergy   . Anxiety    mild no regular meds  . Arthritis    psoriatic arthritis -back  . Breast cancer ([place]) 06/25/15 bx   left breastinvasive ductal ca ,grade 1  . Episcleritis   . Hormone replacement therapy   . Hypertension   . Left carotid artery stenosis    40-59% (01/2015)  . Personal  history of chemotherapy 2017   neoadjuvant  . Personal history of radiation therapy 2018  . Psoriasis   . Smoker    Past Surgical History:  Procedure Laterality Date  . ABDOMINAL HYSTERECTOMY    . BREAST LUMPECTOMY WITH RADIOACTIVE SEED AND SENTINEL LYMPH NODE BIOPSY Left 01/08/2016   Procedure: LEFT BREAST LUMPECTOMY WITH RADIOACTIVE SEED AND SENTINEL LYMPH NODE BIOPSY, BLUE DYE INJECTION;  Surgeon: Rolm Bookbinder, MD;  Location: [facility];  Service: General;  Laterality: Left;  . cervical acdf     C5-6- fusion with plates/screws "Dr. Sherwood Gambler" '12  . COLONOSCOPY WITH PROPOFOL N/A 04/16/2014   Procedure: COLONOSCOPY WITH PROPOFOL;  Surgeon: Garlan Fair, MD;  Location: [facility];  Service: Endoscopy;  Laterality: N/A;  . COLPOSCOPY    . laporotomy     Ovarian cyst  . PORT-A-CATH REMOVAL Right 01/08/2016   Procedure: REMOVAL PORT-A-CATH;  Surgeon: Rolm Bookbinder, MD;  Location: [facility];  Service: General;  Laterality: Right;  . PORTACATH PLACEMENT N/A 07/24/2015   Procedure: INSERTION PORT-A-CATH WITH [place];  Surgeon: Rolm Bookbinder, MD;  Location: [facility];  Service: General;  Laterality: N/A;  . TONSILLECTOMY     child   No Known Allergies Current Outpatient Medications on File Prior to Visit  Medication Sig Dispense Refill  . ALPRAZolam (XANAX) 0.5 MG tablet Take 0.5 mg by mouth 3 (three) times daily as needed for anxiety.    [name] aspirin EC 81 MG tablet Take 81 mg by mouth daily.    [name] atenolol (TENORMIN) 50 MG tablet Take 1 tablet (50 mg total) by mouth daily. 90 tablet 3  . atorvastatin (LIPITOR) 40 MG tablet Take 1 tablet (40 mg total) by mouth daily. 90 tablet 3  . Calcium Carbonate-Vitamin D (CALCIUM-D PO) Take 1 tablet by mouth 2 (two) times daily.     . cetirizine (ZYRTEC) 10 MG tablet Take 10 mg by mouth daily.    . clobetasol (OLUX) 0.05 % topical foam Apply 1 application topically daily as needed (psoriasis).   2  . doxycycline (VIBRA-TABS) 100 MG tablet Take 1 tablet (100 mg total) by mouth 2 (two) times daily. 20 tablet 0  . fluticasone (FLONASE) 50 MCG/ACT nasal spray SPRAY TWICE INTO EACH NOSTRIL EVERY DAY 48 g 3  . hydrochlorothiazide (HYDRODIURIL) 25 MG tablet Take 1 tablet (25 mg total) by mouth daily. 90 tablet 3  . ibuprofen (ADVIL,MOTRIN) 200 MG tablet Take 400 mg by mouth every 6 (six) hours as needed for headache (pain).    . Ketotifen Fumarate (ALLERGY EYE DROPS OP) Place 1 drop into both eyes 2 (two) times daily as needed (allergies).     [name] letrozole (FEMARA) 2.5 MG tablet TAKE 1 TABLET (2.5 MG TOTAL) DAILY BY MOUTH. 90 tablet 0  . losartan (COZAAR) 50 MG tablet TAKE 1 TABLET BY MOUTH ONCE  DAILY. 90 tablet 3  . Multiple Vitamin (MULTIVITAMIN WITH MINERALS) TABS tablet Take 1 tablet by mouth daily.     No current facility-administered medications on file prior to visit.    Social History   Socioeconomic History  . Marital status: Married    Spouse name: Not on file  . Number of children: Not on file  . Years of education: Not on file  . Highest education level: Not on file  Occupational History  . Not on file  Social Needs  . Financial resource strain: Not on file  . Food insecurity:    Worry: Not  on file    Inability: Not on file  . Transportation needs:    Medical: Not on file    Non-medical: Not on file  Tobacco Use  . Smoking status: Current Every Day Smoker    Packs/day: 0.50    Years: 20.00    Pack years: 10.00    Types: Cigarettes  . Smokeless tobacco: Never Used  Substance and Sexual Activity  . Alcohol use: Yes    Alcohol/week: 10.0 standard drinks    Types: 10 Glasses of wine per week    Comment: Glasses of wine several times a week  . Drug use: No  . Sexual activity: Yes    Birth control/protection: Surgical    Comment: married  Lifestyle  . Physical activity:    Days per week: Not on file    Minutes per session: Not on file  . Stress: Not on file  Relationships  . Social connections:    Talks on phone: Not on file    Gets together: Not on file    Attends religious service: Not on file    Active member of club or organization: Not on file    Attends meetings of clubs or organizations: Not on file    Relationship status: Not on file  . Intimate partner violence:    Fear of current or ex partner: Not on file    Emotionally abused: Not on file    Physically abused: Not on file    Forced sexual activity: Not on file  Other Topics Concern  . Not on file  Social History Narrative  . Not on file     Review of Systems  Musculoskeletal: Positive for back pain.  All other systems reviewed and are negative.      Objective:    Physical Exam Vitals signs reviewed.  Constitutional:      General: She is not in acute distress.    Appearance: She is well-developed. She is not diaphoretic.  HENT:     Right Ear: Tympanic membrane and ear canal normal.     Left Ear: Tympanic membrane and ear canal normal.     Nose: No mucosal edema or rhinorrhea.     Right Sinus: No maxillary sinus tenderness.     Left Sinus: No maxillary sinus tenderness.     Mouth/Throat:     Pharynx: No oropharyngeal exudate.  Cardiovascular:     Rate and Rhythm: Normal rate and regular rhythm.     Heart sounds: Normal heart sounds. No murmur.  Pulmonary:     Effort: Pulmonary effort is normal. No respiratory distress.     Breath sounds: Normal breath sounds. No wheezing or rales.  Musculoskeletal:     Lumbar back: She exhibits decreased range of motion, tenderness, bony tenderness, pain and spasm.     Right foot: Normal range of motion. No tenderness, bony tenderness, swelling, deformity or laceration.       Feet:  Neurological:     Mental Status: She is alert and oriented to person, place, and time.     Cranial Nerves: No cranial nerve deficit.     Motor: No abnormal muscle tone.     Coordination: Coordination normal.           Assessment & Plan:  Great toe pain, right  Lumbar radiculopathy I believe the pain in her right great toe is likely peripheral neuropathy.  She is tried gabapentin in the past and does not want to take that again due to  side effects.  We discussed options including Lyrica versus amitriptyline versus Topamax.  She would like to try samples I have of Trokendi XR.  She will take 50 mg every other day for 1 week, then increase to 50 mg once a day for a week and then increase to 100 mg once a day thereafter if she is seeing benefit.  However initially we are going to treat her low back pain first.  I have recommended a prednisone taper back for what I suspect is a herniated disc.  She can use Norco 5/325 1 p.o. every  6 hours as needed pain and then Flexeril 10 mg every 8 hours as needed for muscle spasms.  Once her back is better, then she can try treating the peripheral neuropathy in her toe.

## 2018-01-19 ENCOUNTER — Other Ambulatory Visit: Payer: Self-pay | Admitting: Family Medicine

## 2018-01-26 ENCOUNTER — Other Ambulatory Visit: Payer: Self-pay | Admitting: Family Medicine

## 2018-01-26 MED ORDER — CYCLOBENZAPRINE HCL 10 MG PO TABS: 10.0000 mg | ORAL_TABLET | Freq: Three times a day (TID) | ORAL | 0 refills | Status: DC | PRN

## 2018-01-26 MED ORDER — HYDROCODONE-ACETAMINOPHEN 5-325 MG PO TABS: 1.0000 | ORAL_TABLET | Freq: Four times a day (QID) | ORAL | 0 refills | Status: DC | PRN

## 2018-01-26 NOTE — Telephone Encounter (Signed)
Ok to refill??  Last office visit 01/13/2018.  Last refill 10/14/2018.

## 2018-02-28 ENCOUNTER — Telehealth: Payer: Self-pay

## 2018-02-28 ENCOUNTER — Other Ambulatory Visit: Payer: Self-pay

## 2018-02-28 ENCOUNTER — Other Ambulatory Visit: Payer: Self-pay | Admitting: Hematology and Oncology

## 2018-02-28 DIAGNOSIS — C50312 Malignant neoplasm of lower-inner quadrant of left female breast: Secondary | ICD-10-CM

## 2018-02-28 DIAGNOSIS — Z170 Estrogen receptor positive status [ER+]: Principal | ICD-10-CM

## 2018-02-28 NOTE — Telephone Encounter (Signed)
Pharmacy request for letrozole refill. Pt has not seen Dr Lindi Adie since 2018. LVM for pt to return call regarding need for f/u with Dr Lindi Adie and refill of medication.

## 2018-03-03 ENCOUNTER — Telehealth: Payer: Self-pay | Admitting: Family Medicine

## 2018-03-03 ENCOUNTER — Other Ambulatory Visit: Payer: Self-pay | Admitting: Family Medicine

## 2018-03-03 DIAGNOSIS — M5416 Radiculopathy, lumbar region: Secondary | ICD-10-CM

## 2018-03-03 MED ORDER — HYDROCODONE-ACETAMINOPHEN 5-325 MG PO TABS: 1.0000 | ORAL_TABLET | Freq: Four times a day (QID) | ORAL | 0 refills | Status: DC | PRN

## 2018-03-03 MED ORDER — CYCLOBENZAPRINE HCL 10 MG PO TABS: 10.0000 mg | ORAL_TABLET | Freq: Three times a day (TID) | ORAL | 0 refills | Status: DC | PRN

## 2018-03-03 NOTE — Telephone Encounter (Signed)
ok 

## 2018-03-03 NOTE — Telephone Encounter (Signed)
Pt request provider order three ESI at [facility] with Dr. Jola Baptist.  It has been six months per pt.

## 2018-03-03 NOTE — Telephone Encounter (Signed)
Requesting refill    Flexeril & hydrocodone  LOV: 01/13/18  LRF:  01/26/18 on both

## 2018-03-03 NOTE — Telephone Encounter (Signed)
OK to order 

## 2018-03-06 NOTE — Telephone Encounter (Signed)
Ordered in Epic.

## 2018-03-08 ENCOUNTER — Other Ambulatory Visit: Payer: Self-pay | Admitting: Family Medicine

## 2018-03-08 DIAGNOSIS — M545 Low back pain, unspecified: Secondary | ICD-10-CM

## 2018-03-08 DIAGNOSIS — G8929 Other chronic pain: Secondary | ICD-10-CM

## 2018-03-08 NOTE — Progress Notes (Signed)
Patient Care Team: [name], MD as PCP - General (Family Medicine) [name], MD as Consulting Physician (Hematology and Oncology) [name], [name], NP as Nurse Practitioner (Hematology and Oncology) Kyung Rudd, MD as Consulting Physician (Radiation Oncology) Rolm Bookbinder, MD as Consulting Physician (General Surgery)  DIAGNOSIS:    ICD-10-CM   1. Malignant neoplasm of lower-inner quadrant of left breast in female, estrogen receptor positive ([place]) C50.312    Z17.0     SUMMARY OF ONCOLOGIC HISTORY:   Breast cancer of lower-inner quadrant of left female breast ([place])   06/25/2015 Initial Diagnosis    Left breast biopsy 8:30 position: IDC grade 1, ER 90%, PR 0%, 50%, HER-2 Negative Ratio 1.21, Irregular Mass Left Breast 1 Cm from Nipple 2.5 x 2 x 2.5 Cm with Distortion, T2 N0 Stage II a Clinical Stage, Oncotype DX score 52, 34% ROR    07/30/2015 - 12/16/2015 Neo-Adjuvant Chemotherapy    Neoadjuvant chemotherapy with dose dense Adriamycin and Cytoxan 4 followed by Abraxane weekly 12    12/18/2015 Breast MRI    Excellent interval response to chemotherapy with only minimal residual enhancement, 2 tiny rounded regions along superior margin, unchanged, no abnormal lymph nodes     01/08/2016 Surgery    Left Lumpectomy [name]): IDC grade 1, 0.2 cm, Margins Neg, 0/1 LN Neg; ypT1aN0    02/10/2016 - 03/23/2016 Radiation Therapy    Adjuvant radiation    04/04/2016 -  Anti-estrogen oral therapy    Anastrozole 1 mg daily switched to letrozole 01/04/2017     CHIEF COMPLIANT: Follow-up of letrozole therapy  INTERVAL HISTORY: Kim Ortega is a 63 y.o. with above-mentioned history of left breast cancer treated with lumpectomy, radiation, and is currently on letrozole. I last saw her 1.5 years ago. Her most recent mammogram on 08/29/17 showed no evidence of malignancy bilaterally. She presents to the clinic today with her husband. She reports hot flashes and night sweats  which make it difficult for her to sleep. She has been riding her bike, although not as much as before due to low back pain. She denies any pain or discomfort in her breasts. She reviewed her medication list with me.   REVIEW OF SYSTEMS:   Constitutional: Denies fevers, chills or abnormal weight loss (+) hot flashes (+) night sweats Eyes: Denies blurriness of vision Ears, nose, mouth, throat, and face: Denies mucositis or sore throat Respiratory: Denies cough, dyspnea or wheezes Cardiovascular: Denies palpitation, chest discomfort Gastrointestinal: Denies nausea, heartburn or change in bowel habits Skin: Denies abnormal skin rashes MSK: (+) low back pain Lymphatics: Denies new lymphadenopathy or easy bruising Neurological: Denies numbness, tingling or new weaknesses Behavioral/Psych: Mood is stable, no new changes (+) difficulty sleeping Extremities: No lower extremity edema Breast: denies any pain or lumps or nodules in either breasts All other systems were reviewed with the patient and are negative.  I have reviewed the past medical history, past surgical history, social history and family history with the patient and they are unchanged from previous note.  ALLERGIES:  has No Known Allergies.  MEDICATIONS:  Current Outpatient Medications  Medication Sig Dispense Refill  . ALPRAZolam (XANAX) 0.5 MG tablet Take 0.5 mg by mouth 3 (three) times daily as needed for anxiety.    [name] aspirin EC 81 MG tablet Take 81 mg by mouth daily.    [name] atenolol (TENORMIN) 50 MG tablet TAKE 1 TABLET BY MOUTH EVERY DAY 90 tablet 3  . atorvastatin (LIPITOR) 40 MG tablet Take  1 tablet (40 mg total) by mouth daily. 90 tablet 3  . Calcium Carbonate-Vitamin D (CALCIUM-D PO) Take 1 tablet by mouth 2 (two) times daily.     . cetirizine (ZYRTEC) 10 MG tablet Take 10 mg by mouth daily.    . clobetasol (OLUX) 0.05 % topical foam Apply 1 application topically daily as needed (psoriasis).   2  . cyclobenzaprine  (FLEXERIL) 10 MG tablet Take 1 tablet (10 mg total) by mouth 3 (three) times daily as needed for muscle spasms. 30 tablet 0  . fluticasone (FLONASE) 50 MCG/ACT nasal spray SPRAY TWICE INTO EACH NOSTRIL EVERY DAY 48 g 3  . hydrochlorothiazide (HYDRODIURIL) 25 MG tablet TAKE 1 TABLET BY MOUTH EVERY DAY 90 tablet 3  . HYDROcodone-acetaminophen (NORCO) 5-325 MG tablet Take 1 tablet by mouth every 6 (six) hours as needed for moderate pain. 30 tablet 0  . ibuprofen (ADVIL,MOTRIN) 200 MG tablet Take 400 mg by mouth every 6 (six) hours as needed for headache (pain).    . Ketotifen Fumarate (ALLERGY EYE DROPS OP) Place 1 drop into both eyes 2 (two) times daily as needed (allergies).     [name] letrozole (FEMARA) 2.5 MG tablet Take 1 tablet (2.5 mg total) by mouth daily. 90 tablet 3  . losartan (COZAAR) 100 MG tablet Take 0.5 tablets (50 mg total) by mouth daily. 45 tablet 3  . Multiple Vitamin (MULTIVITAMIN WITH MINERALS) TABS tablet Take 1 tablet by mouth daily.     No current facility-administered medications for this visit.     PHYSICAL EXAMINATION: ECOG PERFORMANCE STATUS: 1 - Symptomatic but completely ambulatory  Vitals:   03/09/18 1538  BP: 139/82  Pulse: 68  Resp: 18  Temp: 98.6 F (37 C)  SpO2: 99%   Filed Weights   03/09/18 1538  Weight: 120 lb 3.2 oz (54.5 kg)    GENERAL: alert, no distress and comfortable SKIN: skin color, texture, turgor are normal, no rashes or significant lesions EYES: normal, Conjunctiva are pink and non-injected, sclera clear OROPHARYNX: no exudate, no erythema and lips, buccal mucosa, and tongue normal  NECK: supple, thyroid normal size, non-tender, without nodularity LYMPH: no palpable lymphadenopathy in the cervical, axillary or inguinal LUNGS: clear to auscultation and percussion with normal breathing effort HEART: regular rate & rhythm and no murmurs and no lower extremity edema ABDOMEN: abdomen soft, non-tender and normal bowel sounds MUSCULOSKELETAL:  no cyanosis of digits and no clubbing  NEURO: alert & oriented x 3 with fluent speech, no focal motor/sensory deficits EXTREMITIES: No lower extremity edema BREAST: No palpable masses or nodules in either right or left breasts. No palpable axillary supraclavicular or infraclavicular adenopathy no breast tenderness or nipple discharge. (exam performed in the presence of a chaperone)  LABORATORY DATA:  I have reviewed the data as listed CMP Latest Ref Rng & Units 03/09/2018 05/20/2016 12/16/2015  Glucose 70 - 99 mg/dL 98 100 149(H)  BUN 8 - 23 mg/dL 15 14.4 8.6  Creatinine 0.44 - 1.00 mg/dL 0.81 0.8 0.8  Sodium 135 - 145 mmol/L 132(L) 141 139  Potassium 3.5 - 5.1 mmol/L 3.6 3.8 4.2  Chloride 98 - 111 mmol/L 96(L) - -  CO2 22 - 32 mmol/L _0 Calcium 8.9 - 10.3 mg/dL 9.3 10.0 9.6  Total Protein 6.5 - 8.1 g/dL 7.2 7.2 6.7  Total Bilirubin 0.3 - 1.2 mg/dL 0.3 0.56 0.40  Alkaline Phos 38 - 126 U/L 82 69 54  AST 15 - 41 U/L 25 26  22  ALT 0 - 44 U/L _0 Lab Results  Component Value Date   WBC 7.7 03/09/2018   HGB 11.7 (L) 03/09/2018   HCT 34.0 (L) 03/09/2018   MCV 96.9 03/09/2018   PLT 303 03/09/2018   NEUTROABS 4.3 03/09/2018    ASSESSMENT & PLAN:  Breast cancer of lower-inner quadrant of left female breast ([facility]) Left breast biopsy 8:30 position: IDC grade 1, ER 90%, PR 0%, 50%, HER-2 Negative Ratio 1.21, Irregular Mass Left Breast 1 Cm from Nipple 2.5 x 2 x 2.5 Cm with Distortion,  T2 N0 Stage II a Clinical Stage Oncotype DX score 52, 34% risk of recurrence without chemotherapy  Treatment Summary: 1. Neoadjuvant chemotherapy with dose dense Adriamycin and Cytoxan 4 followed by Abraxane weekly 12; started 07/30/2015 completed 12/16/2015  2. 1/4/17Left Lumpectomy: IDC grade 1, 0.2 cm, Margins Neg, 0/1 LN Neg; ypT1aN0 3. Adjuvant radiation therapy 02/10/16- 03/23/16  Plan: Adjuvant anastrozole 1 mg daily 5-10 years switched to letrozole 11/19/2016 due to  musculoskeletal aches and pains  Letrozole toxicities:  1.  Hot flashes and sweats that keep her up at night 2.  Muscle aches and pains much better  Breast cancer surveillance: 1.  Breast exam 03/09/2018: Benign 2.  Mammogram: 08/29/2017: No evidence of malignancy, breast density category C  Return to clinic in 1 year for follow-up    No orders of the defined types were placed in this encounter.  The patient has a good understanding of the overall plan. she agrees with it. she will call with any problems that may develop before the next visit here.  [name], MD 03/09/2018  Julious Oka Dorshimer am acting as scribe for Dr. [name].  I have reviewed the above documentation for accuracy and completeness, and I agree with the above.

## 2018-03-09 ENCOUNTER — Inpatient Hospital Stay: Payer: BC Managed Care – PPO | Attending: Hematology and Oncology | Admitting: Hematology and Oncology

## 2018-03-09 ENCOUNTER — Inpatient Hospital Stay: Payer: BC Managed Care – PPO

## 2018-03-09 DIAGNOSIS — Z170 Estrogen receptor positive status [ER+]: Secondary | ICD-10-CM

## 2018-03-09 DIAGNOSIS — Z923 Personal history of irradiation: Secondary | ICD-10-CM | POA: Diagnosis not present

## 2018-03-09 DIAGNOSIS — Z7982 Long term (current) use of aspirin: Secondary | ICD-10-CM | POA: Diagnosis not present

## 2018-03-09 DIAGNOSIS — Z79899 Other long term (current) drug therapy: Secondary | ICD-10-CM | POA: Diagnosis not present

## 2018-03-09 DIAGNOSIS — Z79811 Long term (current) use of aromatase inhibitors: Secondary | ICD-10-CM | POA: Insufficient documentation

## 2018-03-09 DIAGNOSIS — Z9221 Personal history of antineoplastic chemotherapy: Secondary | ICD-10-CM | POA: Insufficient documentation

## 2018-03-09 DIAGNOSIS — M545 Low back pain: Secondary | ICD-10-CM

## 2018-03-09 DIAGNOSIS — R232 Flushing: Secondary | ICD-10-CM | POA: Diagnosis not present

## 2018-03-09 DIAGNOSIS — C50312 Malignant neoplasm of lower-inner quadrant of left female breast: Secondary | ICD-10-CM

## 2018-03-09 LAB — CBC WITH DIFFERENTIAL (CANCER CENTER ONLY)
ABS IMMATURE GRANULOCYTES: 0.03 10*3/uL (ref 0.00–0.07)
BASOS ABS: 0.1 10*3/uL (ref 0.0–0.1)
Basophils Relative: 1 %
EOS PCT: 2 %
Eosinophils Absolute: 0.1 10*3/uL (ref 0.0–0.5)
HEMATOCRIT: 34 % — AB (ref 36.0–46.0)
HEMOGLOBIN: 11.7 g/dL — AB (ref 12.0–15.0)
IMMATURE GRANULOCYTES: 0 %
LYMPHS ABS: 2.3 10*3/uL (ref 0.7–4.0)
LYMPHS PCT: 29 %
MCH: 33.3 pg (ref 26.0–34.0)
MCHC: 34.4 g/dL (ref 30.0–36.0)
MCV: 96.9 fL (ref 80.0–100.0)
Monocytes Absolute: 1 10*3/uL (ref 0.1–1.0)
Monocytes Relative: 13 %
NEUTROS ABS: 4.3 10*3/uL (ref 1.7–7.7)
NEUTROS PCT: 55 %
NRBC: 0 % (ref 0.0–0.2)
Platelet Count: 303 10*3/uL (ref 150–400)
RBC: 3.51 MIL/uL — AB (ref 3.87–5.11)
RDW: 12.9 % (ref 11.5–15.5)
WBC: 7.7 10*3/uL (ref 4.0–10.5)

## 2018-03-09 LAB — CMP (CANCER CENTER ONLY)
ALT: 24 U/L (ref 0–44)
ANION GAP: 8 (ref 5–15)
AST: 25 U/L (ref 15–41)
Albumin: 4.2 g/dL (ref 3.5–5.0)
Alkaline Phosphatase: 82 U/L (ref 38–126)
BUN: 15 mg/dL (ref 8–23)
CHLORIDE: 96 mmol/L — AB (ref 98–111)
CO2: 28 mmol/L (ref 22–32)
Calcium: 9.3 mg/dL (ref 8.9–10.3)
Creatinine: 0.81 mg/dL (ref 0.44–1.00)
Glucose, Bld: 98 mg/dL (ref 70–99)
POTASSIUM: 3.6 mmol/L (ref 3.5–5.1)
SODIUM: 132 mmol/L — AB (ref 135–145)
Total Bilirubin: 0.3 mg/dL (ref 0.3–1.2)
Total Protein: 7.2 g/dL (ref 6.5–8.1)

## 2018-03-09 MED ORDER — LETROZOLE 2.5 MG PO TABS: 2.5000 mg | ORAL_TABLET | Freq: Every day | ORAL | 3 refills | Status: DC

## 2018-03-09 NOTE — Assessment & Plan Note (Addendum)
Left breast biopsy 8:30 position: IDC grade 1, ER 90%, PR 0%, 50%, HER-2 Negative Ratio 1.21, Irregular Mass Left Breast 1 Cm from Nipple 2.5 x 2 x 2.5 Cm with Distortion,  T2 N0 Stage II a Clinical Stage Oncotype DX score 52, 34% risk of recurrence without chemotherapy  Treatment Summary: 1. Neoadjuvant chemotherapy with dose dense Adriamycin and Cytoxan 4 followed by Abraxane weekly 12; started 07/30/2015 completed 12/16/2015  2. 1/4/17Left Lumpectomy: IDC grade 1, 0.2 cm, Margins Neg, 0/1 LN Neg; ypT1aN0 3. Adjuvant radiation therapy 02/10/16- 03/23/16  Plan: Adjuvant anastrozole 1 mg daily 5-10 years switched to letrozole 11/19/2016 due to musculoskeletal aches and pains  Letrozole toxicities:  1.  Hot flashes and sweats that keep her up at night 2.  Muscle aches and pains much better  Breast cancer surveillance: 1.  Breast exam 03/09/2018: Benign 2.  Mammogram: 08/29/2017: No evidence of malignancy, breast density category C  Return to clinic in 1 year for follow-up

## 2018-03-17 ENCOUNTER — Other Ambulatory Visit: Payer: Self-pay

## 2018-03-17 ENCOUNTER — Ambulatory Visit
Admission: RE | Admit: 2018-03-17 | Discharge: 2018-03-17 | Disposition: A | Discharge status: Home / Self Care | Payer: BC Managed Care – PPO | Source: Ambulatory Visit | Attending: Family Medicine | Admitting: Family Medicine

## 2018-03-17 DIAGNOSIS — G8929 Other chronic pain: Secondary | ICD-10-CM

## 2018-03-17 DIAGNOSIS — M545 Low back pain, unspecified: Secondary | ICD-10-CM

## 2018-03-17 MED ORDER — IOPAMIDOL (ISOVUE-M 200) INJECTION 41%: 1.0000 mL | Freq: Once | INTRAMUSCULAR | Status: DC

## 2018-03-17 MED ORDER — METHYLPREDNISOLONE ACETATE 40 MG/ML INJ SUSP (RADIOLOG: 120.0000 mg | Freq: Once | INTRAMUSCULAR | Status: DC

## 2018-03-17 NOTE — Discharge Instructions (Signed)

## 2018-07-05 ENCOUNTER — Other Ambulatory Visit: Payer: Self-pay | Admitting: Family Medicine

## 2018-07-05 ENCOUNTER — Encounter: Payer: Self-pay | Admitting: Family Medicine

## 2018-07-05 DIAGNOSIS — M545 Low back pain, unspecified: Secondary | ICD-10-CM

## 2018-07-05 DIAGNOSIS — G8929 Other chronic pain: Secondary | ICD-10-CM

## 2018-07-05 MED ORDER — CYCLOBENZAPRINE HCL 10 MG PO TABS: 10.0000 mg | ORAL_TABLET | Freq: Three times a day (TID) | ORAL | 0 refills | Status: DC | PRN

## 2018-07-05 MED ORDER — HYDROCODONE-ACETAMINOPHEN 5-325 MG PO TABS: 1.0000 | ORAL_TABLET | Freq: Four times a day (QID) | ORAL | 0 refills | Status: DC | PRN

## 2018-07-05 MED ORDER — PREDNISONE 20 MG PO TABS: ORAL_TABLET | ORAL | 0 refills | Status: DC

## 2018-07-05 NOTE — Telephone Encounter (Signed)
Ok to refill??  Last office visit 01/13/2018.  Last refill 03/03/2018.

## 2018-07-21 ENCOUNTER — Ambulatory Visit
Admission: RE | Admit: 2018-07-21 | Discharge: 2018-07-21 | Disposition: A | Discharge status: Home / Self Care | Payer: BC Managed Care – PPO | Source: Ambulatory Visit | Attending: Family Medicine | Admitting: Family Medicine

## 2018-07-21 ENCOUNTER — Other Ambulatory Visit: Payer: Self-pay | Admitting: Family Medicine

## 2018-07-21 ENCOUNTER — Other Ambulatory Visit: Payer: Self-pay

## 2018-07-21 DIAGNOSIS — G8929 Other chronic pain: Secondary | ICD-10-CM

## 2018-07-21 MED ORDER — IOPAMIDOL (ISOVUE-M 200) INJECTION 41%
1.0000 mL | Freq: Once | INTRAMUSCULAR | Status: AC
  Administered 2018-07-21: 1 mL via EPIDURAL

## 2018-07-21 MED ORDER — METHYLPREDNISOLONE ACETATE 40 MG/ML INJ SUSP (RADIOLOG
120.0000 mg | Freq: Once | INTRAMUSCULAR | Status: AC
  Administered 2018-07-21: 08:00:00 120 mg via EPIDURAL

## 2018-07-21 MED ORDER — ATORVASTATIN CALCIUM 40 MG PO TABS: 40.0000 mg | ORAL_TABLET | Freq: Every day | ORAL | 3 refills | Status: DC

## 2018-07-21 NOTE — Discharge Instructions (Signed)

## 2018-07-28 ENCOUNTER — Other Ambulatory Visit: Payer: Self-pay | Admitting: Obstetrics & Gynecology

## 2018-07-28 ENCOUNTER — Other Ambulatory Visit: Payer: Self-pay | Admitting: Hematology and Oncology

## 2018-07-28 DIAGNOSIS — Z853 Personal history of malignant neoplasm of breast: Secondary | ICD-10-CM

## 2018-07-28 DIAGNOSIS — Z1231 Encounter for screening mammogram for malignant neoplasm of breast: Secondary | ICD-10-CM

## 2018-07-28 DIAGNOSIS — Z9889 Other specified postprocedural states: Secondary | ICD-10-CM

## 2018-08-17 ENCOUNTER — Other Ambulatory Visit: Payer: Self-pay | Admitting: Family Medicine

## 2018-08-22 ENCOUNTER — Other Ambulatory Visit: Payer: Self-pay | Admitting: Family Medicine

## 2018-08-22 MED ORDER — HYDROCODONE-ACETAMINOPHEN 5-325 MG PO TABS: 1.0000 | ORAL_TABLET | Freq: Four times a day (QID) | ORAL | 0 refills | Status: DC | PRN

## 2018-08-22 MED ORDER — CYCLOBENZAPRINE HCL 10 MG PO TABS: 10.0000 mg | ORAL_TABLET | Freq: Three times a day (TID) | ORAL | 2 refills | Status: DC | PRN

## 2018-08-22 NOTE — Telephone Encounter (Signed)
Patient is requesting a refill on Hydrocodone & Flexeril  LOV: 07/06/17  LRF:   07/05/18 on both

## 2018-08-30 ENCOUNTER — Other Ambulatory Visit: Payer: Self-pay | Admitting: Hematology and Oncology

## 2018-08-30 DIAGNOSIS — Z853 Personal history of malignant neoplasm of breast: Secondary | ICD-10-CM

## 2018-08-30 DIAGNOSIS — Z9889 Other specified postprocedural states: Secondary | ICD-10-CM

## 2018-09-01 ENCOUNTER — Ambulatory Visit
Admission: RE | Admit: 2018-09-01 | Discharge: 2018-09-01 | Disposition: A | Discharge status: Home / Self Care | Payer: BC Managed Care – PPO | Source: Ambulatory Visit | Attending: Obstetrics & Gynecology | Admitting: Obstetrics & Gynecology

## 2018-09-01 ENCOUNTER — Other Ambulatory Visit: Payer: Self-pay

## 2018-09-01 DIAGNOSIS — Z9889 Other specified postprocedural states: Secondary | ICD-10-CM

## 2018-09-01 DIAGNOSIS — Z853 Personal history of malignant neoplasm of breast: Secondary | ICD-10-CM

## 2018-09-20 ENCOUNTER — Other Ambulatory Visit: Payer: Self-pay | Admitting: Obstetrics & Gynecology

## 2018-09-20 ENCOUNTER — Ambulatory Visit
Admission: RE | Admit: 2018-09-20 | Discharge: 2018-09-20 | Disposition: A | Discharge status: Home / Self Care | Payer: BC Managed Care – PPO | Source: Ambulatory Visit | Attending: Obstetrics & Gynecology | Admitting: Obstetrics & Gynecology

## 2018-09-20 ENCOUNTER — Other Ambulatory Visit: Payer: Self-pay

## 2018-09-20 DIAGNOSIS — R1900 Intra-abdominal and pelvic swelling, mass and lump, unspecified site: Secondary | ICD-10-CM

## 2018-09-20 MED ORDER — IOPAMIDOL (ISOVUE-300) INJECTION 61%
100.0000 mL | Freq: Once | INTRAVENOUS | Status: AC | PRN
  Administered 2018-09-20: 100 mL via INTRAVENOUS

## 2018-09-24 ENCOUNTER — Emergency Department (HOSPITAL_COMMUNITY)
Admission: EM | Admit: 2018-09-24 | Discharge: 2018-09-24 | Disposition: A | Discharge status: Home / Self Care | Payer: BC Managed Care – PPO | Attending: Emergency Medicine | Admitting: Emergency Medicine

## 2018-09-24 ENCOUNTER — Other Ambulatory Visit: Payer: Self-pay

## 2018-09-24 ENCOUNTER — Encounter (HOSPITAL_COMMUNITY): Payer: Self-pay | Admitting: Emergency Medicine

## 2018-09-24 ENCOUNTER — Other Ambulatory Visit: Payer: Self-pay | Admitting: Family Medicine

## 2018-09-24 DIAGNOSIS — R319 Hematuria, unspecified: Secondary | ICD-10-CM | POA: Diagnosis not present

## 2018-09-24 DIAGNOSIS — Z5321 Procedure and treatment not carried out due to patient leaving prior to being seen by health care provider: Secondary | ICD-10-CM | POA: Diagnosis not present

## 2018-09-24 LAB — URINALYSIS, ROUTINE W REFLEX MICROSCOPIC
Bacteria, UA: NONE SEEN
RBC / HPF: >50 RBC/hpf — ABNORMAL HIGH (ref 0–5)

## 2018-09-24 NOTE — ED Triage Notes (Signed)
Pt reports blood in her urine this am, also c/o urinary frequency and pain with urination.

## 2018-09-24 NOTE — ED Notes (Signed)
PT does not want to wait anymore. She is leaving.Marland KitchenMarland KitchenMarland Kitchen

## 2018-09-25 ENCOUNTER — Ambulatory Visit
Admission: RE | Admit: 2018-09-25 | Discharge: 2018-09-25 | Disposition: A | Discharge status: Home / Self Care | Payer: BC Managed Care – PPO | Source: Ambulatory Visit | Attending: Obstetrics & Gynecology | Admitting: Obstetrics & Gynecology

## 2018-09-25 DIAGNOSIS — R1900 Intra-abdominal and pelvic swelling, mass and lump, unspecified site: Secondary | ICD-10-CM

## 2018-09-25 MED ORDER — HYDROCODONE-ACETAMINOPHEN 5-325 MG PO TABS: 1.0000 | ORAL_TABLET | Freq: Four times a day (QID) | ORAL | 0 refills | Status: DC | PRN

## 2018-09-25 MED ORDER — GADOBENATE DIMEGLUMINE 529 MG/ML IV SOLN
10.0000 mL | Freq: Once | INTRAVENOUS | Status: AC | PRN
  Administered 2018-09-25: 10 mL via INTRAVENOUS

## 2018-09-27 ENCOUNTER — Telehealth: Payer: Self-pay | Admitting: *Deleted

## 2018-09-27 NOTE — Telephone Encounter (Signed)
Patient called back and scheduled appt 

## 2018-09-27 NOTE — Telephone Encounter (Signed)
Called and left the patient a message to call the office back. Need to schedule the patient for an appt with Dr. Denman George

## 2018-09-29 ENCOUNTER — Inpatient Hospital Stay: Payer: BC Managed Care – PPO | Attending: Gynecologic Oncology

## 2018-09-29 ENCOUNTER — Other Ambulatory Visit: Payer: Self-pay

## 2018-09-29 ENCOUNTER — Encounter: Payer: Self-pay | Admitting: Gynecologic Oncology

## 2018-09-29 ENCOUNTER — Other Ambulatory Visit: Payer: Self-pay | Admitting: Gynecologic Oncology

## 2018-09-29 ENCOUNTER — Telehealth: Payer: Self-pay

## 2018-09-29 ENCOUNTER — Inpatient Hospital Stay (HOSPITAL_BASED_OUTPATIENT_CLINIC_OR_DEPARTMENT_OTHER): Payer: BC Managed Care – PPO | Admitting: Gynecologic Oncology

## 2018-09-29 VITALS — BP 138/77 | HR 78 | Temp 97.8°F | Resp 16 | Ht 64.0 in | Wt 111.0 lb

## 2018-09-29 DIAGNOSIS — M1990 Unspecified osteoarthritis, unspecified site: Secondary | ICD-10-CM | POA: Diagnosis not present

## 2018-09-29 DIAGNOSIS — R310 Gross hematuria: Secondary | ICD-10-CM | POA: Insufficient documentation

## 2018-09-29 DIAGNOSIS — F419 Anxiety disorder, unspecified: Secondary | ICD-10-CM | POA: Insufficient documentation

## 2018-09-29 DIAGNOSIS — Z853 Personal history of malignant neoplasm of breast: Secondary | ICD-10-CM | POA: Insufficient documentation

## 2018-09-29 DIAGNOSIS — F17210 Nicotine dependence, cigarettes, uncomplicated: Secondary | ICD-10-CM | POA: Insufficient documentation

## 2018-09-29 DIAGNOSIS — L409 Psoriasis, unspecified: Secondary | ICD-10-CM | POA: Insufficient documentation

## 2018-09-29 DIAGNOSIS — L4050 Arthropathic psoriasis, unspecified: Secondary | ICD-10-CM | POA: Diagnosis not present

## 2018-09-29 DIAGNOSIS — Z923 Personal history of irradiation: Secondary | ICD-10-CM | POA: Diagnosis not present

## 2018-09-29 DIAGNOSIS — H15109 Unspecified episcleritis, unspecified eye: Secondary | ICD-10-CM | POA: Diagnosis not present

## 2018-09-29 DIAGNOSIS — R1900 Intra-abdominal and pelvic swelling, mass and lump, unspecified site: Secondary | ICD-10-CM

## 2018-09-29 DIAGNOSIS — I6522 Occlusion and stenosis of left carotid artery: Secondary | ICD-10-CM | POA: Diagnosis not present

## 2018-09-29 DIAGNOSIS — D398 Neoplasm of uncertain behavior of other specified female genital organs: Secondary | ICD-10-CM | POA: Diagnosis not present

## 2018-09-29 DIAGNOSIS — Z9221 Personal history of antineoplastic chemotherapy: Secondary | ICD-10-CM | POA: Diagnosis not present

## 2018-09-29 DIAGNOSIS — I10 Essential (primary) hypertension: Secondary | ICD-10-CM | POA: Insufficient documentation

## 2018-09-29 LAB — URINALYSIS, COMPLETE (UACMP) WITH MICROSCOPIC
Bacteria, UA: NONE SEEN
Bilirubin Urine: NEGATIVE
Glucose, UA: NEGATIVE mg/dL
Hgb urine dipstick: NEGATIVE
Ketones, ur: NEGATIVE mg/dL
Leukocytes,Ua: NEGATIVE
Nitrite: NEGATIVE
Protein, ur: NEGATIVE mg/dL
Specific Gravity, Urine: 1.008 (ref 1.005–1.030)
pH: 6 (ref 5.0–8.0)

## 2018-09-29 MED ORDER — SENNOSIDES-DOCUSATE SODIUM 8.6-50 MG PO TABS: 2.0000 | ORAL_TABLET | Freq: Every day | ORAL | 0 refills | Status: DC

## 2018-09-29 MED ORDER — HYDROCODONE-ACETAMINOPHEN 5-325 MG PO TABS: 1.0000 | ORAL_TABLET | Freq: Four times a day (QID) | ORAL | 0 refills | Status: DC | PRN

## 2018-09-29 MED ORDER — IBUPROFEN 600 MG PO TABS: 600.0000 mg | ORAL_TABLET | Freq: Four times a day (QID) | ORAL | 0 refills | Status: AC | PRN

## 2018-09-29 NOTE — H&P (View-Only) (Signed)
Consult Note: Gyn-Onc  Consult was requested by Dr. Nori Riis for the evaluation of Kim Ortega 63 y.o. female  CC:  Chief Complaint  Patient presents with  . cystic mass of left adnexa    Assessment/Plan:  Kim Ortega  is a 63 y.o.  year old with a cystic left ovarian simple bilobed cyst.  I discussed with the patient that I am recommending surgical excision with robotic assisted BSO.  I explained anticipated surgical risks including  bleeding, infection, damage to internal organs (such as bladder,ureters, bowels), blood clot, reoperation and rehospitalization. I explained anticipated postoperative recovery.  I offered close expectant follow-up as an alternative to surgery however she is interested in a surgical resection.  I think she is a good candidate for surgery given her overall good health.  We will stop her aspirin now so that she has had at least 10 days to recover a normal platelet function.  I explained that I think that this mass is benign and surgical staging is unlikely to be necessary.   HPI: Kim Ortega is a 63 year old P0 who is seen in consultation at the request of Dr Nori Riis for evaluation of a left ovarian cystic mass.  The patient was receiving a routine pelvic examination with Dr. Nori Riis on September 20, 2018.  At that time pelvic fullness was appreciated and a transvaginal ultrasound was obtained the same day.  This revealed that she was status post hysterectomy, with a midline appearing simple cysts 2 of them in size, 1 of them 4.5 x 2 cm the other 8.2 x 6.1 cm.  No blood flow within the cyst.  She underwent a CT scan of the abdomen and pelvis on September 20, 2018 and this revealed a large complex cystic mass in the left adnexa.  It measured 6.4 x 8.4 cm and had 1 thin internal septation and eccentric soft tissue density along its medial border.  It appeared to be arising from the left adnexa.  The patient has a surgical history that is  significant for multiple laparotomies via low transverse incision including a myomectomy, hysterectomy 1988 for fibroid uterus, history of ovarian cystectomies prior to those surgeries.  She has a history of vascular disease with known carotid plaques, she has hypertension.  She takes aspirin 81 mg daily.  She is never been pregnant.  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  She is receiving letrozole for this.  She has symptomatic hot flashes with this but they are not overly bothersome to the patient.  She sees Dr. Lindi Adie for follow-up in the cancer center.  Current Meds:  Outpatient Encounter Medications as of 09/29/2018  Medication Sig  . ALPRAZolam (XANAX) 0.5 MG tablet Take 0.5 mg by mouth 3 (three) times daily as needed for anxiety.  [name] aspirin EC 81 MG tablet Take 81 mg by mouth daily.  [name] atenolol (TENORMIN) 50 MG tablet TAKE 1 TABLET BY MOUTH EVERY DAY  . atorvastatin (LIPITOR) 40 MG tablet Take 1 tablet (40 mg total) by mouth daily.  . Calcium Carbonate-Vitamin D (CALCIUM-D PO) Take 1 tablet by mouth 2 (two) times daily.   . cetirizine (ZYRTEC) 10 MG tablet Take 10 mg by mouth daily.  . clobetasol (OLUX) 0.05 % topical foam Apply 1 application topically daily as needed (psoriasis).   . cyclobenzaprine (FLEXERIL) 10 MG tablet Take 1 tablet (10 mg total) by mouth 3 (three) times daily as needed  for muscle spasms.  . fluticasone (FLONASE) 50 MCG/ACT nasal spray SPRAY TWICE INTO EACH NOSTRIL EVERY DAY  . hydrochlorothiazide (HYDRODIURIL) 25 MG tablet TAKE 1 TABLET BY MOUTH EVERY DAY  . Ketotifen Fumarate (ALLERGY EYE DROPS OP) Place 1 drop into both eyes 2 (two) times daily as needed (allergies).   [name] letrozole (FEMARA) 2.5 MG tablet Take 1 tablet (2.5 mg total) by mouth daily.  [name] losartan (COZAAR) 100 MG tablet Take 0.5 tablets (50 mg total) by mouth daily.  . Multiple Vitamin (MULTIVITAMIN WITH MINERALS) TABS tablet Take 1  tablet by mouth daily.  . [DISCONTINUED] HYDROcodone-acetaminophen (NORCO) 5-325 MG tablet Take 1 tablet by mouth every 6 (six) hours as needed for moderate pain.  . [DISCONTINUED] ibuprofen (ADVIL,MOTRIN) 200 MG tablet Take 400 mg by mouth every 6 (six) hours as needed for headache (pain).  [name] HYDROcodone-acetaminophen (NORCO/VICODIN) 5-325 MG tablet Take 1 tablet by mouth every 6 (six) hours as needed for severe pain. For AFTER surgery only, do not take and drive  . ibuprofen (ADVIL) 600 MG tablet Take 1 tablet (600 mg total) by mouth every 6 (six) hours as needed for moderate pain. For AFTER surgery only  . senna-docusate (SENOKOT-S) 8.6-50 MG tablet Take 2 tablets by mouth at bedtime. For AFTER surgery, do not take if having diarrhea  . [DISCONTINUED] HYDROcodone-acetaminophen (NORCO/VICODIN) 5-325 MG tablet Take 1 tablet by mouth every 6 (six) hours as needed for severe pain. For AFTER surgery only, do not take and drive  . [DISCONTINUED] predniSONE (DELTASONE) 20 MG tablet 40mg  x 2 days, 20mg  x 2 days, 10mg  x 2 days, then stop (Patient not taking: Reported on 09/29/2018)   No facility-administered encounter medications on file as of 09/29/2018.     Allergy: No Known Allergies  Social Hx:   Social History   Socioeconomic History  . Marital status: Married    Spouse name: Not on file  . Number of children: Not on file  . Years of education: Not on file  . Highest education level: Not on file  Occupational History  . Not on file  Social Needs  . Financial resource strain: Not on file  . Food insecurity    Worry: Not on file    Inability: Not on file  . Transportation needs    Medical: Not on file    Non-medical: Not on file  Tobacco Use  . Smoking status: Current Every Day Smoker    Packs/day: 0.50    Years: 20.00    Pack years: 10.00    Types: Cigarettes  . Smokeless tobacco: Never Used  Substance and Sexual Activity  . Alcohol use: Yes    Alcohol/week: 10.0 standard drinks     Types: 10 Glasses of wine per week    Comment: Glasses of wine several times a week  . Drug use: No  . Sexual activity: Yes    Birth control/protection: Surgical    Comment: married  Lifestyle  . Physical activity    Days per week: Not on file    Minutes per session: Not on file  . Stress: Not on file  Relationships  . Social Herbalist on phone: Not on file    Gets together: Not on file    Attends religious service: Not on file    Active member of club or organization: Not on file    Attends meetings of clubs or organizations: Not on file    Relationship status: Not on file  .  Intimate partner violence    Fear of current or ex partner: Not on file    Emotionally abused: Not on file    Physically abused: Not on file    Forced sexual activity: Not on file  Other Topics Concern  . Not on file  Social History Narrative  . Not on file    Past Surgical Hx:  Past Surgical History:  Procedure Laterality Date  . ABDOMINAL HYSTERECTOMY    . BREAST BIOPSY Left 06/2015  . BREAST LUMPECTOMY Left 01/2016  . BREAST LUMPECTOMY WITH RADIOACTIVE SEED AND SENTINEL LYMPH NODE BIOPSY Left 01/08/2016   Procedure: LEFT BREAST LUMPECTOMY WITH RADIOACTIVE SEED AND SENTINEL LYMPH NODE BIOPSY, BLUE DYE INJECTION;  Surgeon: Rolm Bookbinder, MD;  Location: [facility];  Service: General;  Laterality: Left;  . cervical acdf     C5-6- fusion with plates/screws "Dr. Sherwood Gambler" '12  . COLONOSCOPY WITH PROPOFOL N/A 04/16/2014   Procedure: COLONOSCOPY WITH PROPOFOL;  Surgeon: Garlan Fair, MD;  Location: [facility];  Service: Endoscopy;  Laterality: N/A;  . COLPOSCOPY    . laporotomy     Ovarian cyst  . PORT-A-CATH REMOVAL Right 01/08/2016   Procedure: REMOVAL PORT-A-CATH;  Surgeon: Rolm Bookbinder, MD;  Location: [facility];  Service: General;  Laterality: Right;  . PORTACATH PLACEMENT N/A 07/24/2015   Procedure: INSERTION PORT-A-CATH WITH [place];  Surgeon:  Rolm Bookbinder, MD;  Location: [facility];  Service: General;  Laterality: N/A;  . TONSILLECTOMY     child    Past Medical Hx:  Past Medical History:  Diagnosis Date  . Allergy   . Anxiety    mild no regular meds  . Arthritis    psoriatic arthritis -back  . Breast cancer ([place]) 06/25/15 bx   left breastinvasive ductal ca ,grade 1  . Episcleritis   . Hormone replacement therapy   . Hypertension   . Left carotid artery stenosis    40-59% (01/2015)  . Personal history of chemotherapy 2017   neoadjuvant  . Personal history of radiation therapy 2018  . Psoriasis   . Smoker     Past Gynecological History:  See HPI No LMP recorded. Patient has had a hysterectomy.  Family Hx:  Family History  Problem Relation Age of Onset  . Stroke Mother     Review of Systems:  Constitutional  Feels well,   ENT Normal appearing ears and nares bilaterally Skin/Breast  No rash, sores, jaundice, itching, dryness Cardiovascular  No chest pain, shortness of breath, or edema  Pulmonary  No cough or wheeze.  Gastro Intestinal  No nausea, vomitting, or diarrhoea. No bright red blood per rectum, no abdominal pain, change in bowel movement, or constipation.  Genito Urinary  No frequency, urgency, dysuria, no bleeding Musculo Skeletal  No myalgia, arthralgia, joint swelling or pain  Neurologic  No weakness, numbness, change in gait,  Psychology  No depression, anxiety, insomnia.   Vitals:  Blood pressure 138/77, pulse 78, temperature 97.8 F (36.6 C), temperature source Temporal, resp. rate 16, height 5\' 4"  (1.626 m), weight 111 lb (50.3 kg), SpO2 100 %.  Physical Exam: WD in NAD Neck  Supple NROM, without any enlargements.  Lymph Node Survey No cervical supraclavicular or inguinal adenopathy Cardiovascular  Pulse normal rate, regularity and rhythm. S1 and S2 normal.  Lungs  Clear to auscultation bilateraly, without wheezes/crackles/rhonchi. Good air movement.  Skin  No  rash/lesions/breakdown  Psychiatry  Alert and oriented to person, place, and time  Abdomen  Normoactive bowel sounds, abdomen soft, non-tender and thin without evidence of hernia. Back No CVA tenderness Genito Urinary  Vulva/vagina: Normal external female genitalia.  No lesions. No discharge or bleeding.  Bladder/urethra:  No lesions or masses, well supported bladder  Vagina: normal, smooth, narrow.   Cervix and Uterus: surgically absent   Adnexa: Cystic smooth fullness in the left adnexa appreciated.  Rectal  deferred Extremities  No bilateral cyanosis, clubbing or edema.   Thereasa Solo, MD  09/29/2018, 5:59 PM

## 2018-09-29 NOTE — Progress Notes (Signed)
Consult Note: Gyn-Onc  Consult was requested by Dr. Nori Riis for the evaluation of Kim Ortega 63 y.o. female  CC:  Chief Complaint  Patient presents with  . cystic mass of left adnexa    Assessment/Plan:  Kim Ortega  is a 63 y.o.  year old with a cystic left ovarian simple bilobed cyst.  I discussed with the patient that I am recommending surgical excision with robotic assisted BSO.  I explained anticipated surgical risks including  bleeding, infection, damage to internal organs (such as bladder,ureters, bowels), blood clot, reoperation and rehospitalization. I explained anticipated postoperative recovery.  I offered close expectant follow-up as an alternative to surgery however she is interested in a surgical resection.  I think she is a good candidate for surgery given her overall good health.  We will stop her aspirin now so that she has had at least 10 days to recover a normal platelet function.  I explained that I think that this mass is benign and surgical staging is unlikely to be necessary.   HPI: Kim Ortega is a 63 year old P0 who is seen in consultation at the request of Dr Nori Riis for evaluation of a left ovarian cystic mass.  The patient was receiving a routine pelvic examination with Dr. Nori Riis on September 20, 2018.  At that time pelvic fullness was appreciated and a transvaginal ultrasound was obtained the same day.  This revealed that she was status post hysterectomy, with a midline appearing simple cysts 2 of them in size, 1 of them 4.5 x 2 cm the other 8.2 x 6.1 cm.  No blood flow within the cyst.  She underwent a CT scan of the abdomen and pelvis on September 20, 2018 and this revealed a large complex cystic mass in the left adnexa.  It measured 6.4 x 8.4 cm and had 1 thin internal septation and eccentric soft tissue density along its medial border.  It appeared to be arising from the left adnexa.  The patient has a surgical history that is  significant for multiple laparotomies via low transverse incision including a myomectomy, hysterectomy 1988 for fibroid uterus, history of ovarian cystectomies prior to those surgeries.  She has a history of vascular disease with known carotid plaques, she has hypertension.  She takes aspirin 81 mg daily.  She is never been pregnant.  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  She is receiving letrozole for this.  She has symptomatic hot flashes with this but they are not overly bothersome to the patient.  She sees Dr. Lindi Adie for follow-up in the cancer center.  Current Meds:  Outpatient Encounter Medications as of 09/29/2018  Medication Sig  . ALPRAZolam (XANAX) 0.5 MG tablet Take 0.5 mg by mouth 3 (three) times daily as needed for anxiety.  [name] aspirin EC 81 MG tablet Take 81 mg by mouth daily.  [name] atenolol (TENORMIN) 50 MG tablet TAKE 1 TABLET BY MOUTH EVERY DAY  . atorvastatin (LIPITOR) 40 MG tablet Take 1 tablet (40 mg total) by mouth daily.  . Calcium Carbonate-Vitamin D (CALCIUM-D PO) Take 1 tablet by mouth 2 (two) times daily.   . cetirizine (ZYRTEC) 10 MG tablet Take 10 mg by mouth daily.  . clobetasol (OLUX) 0.05 % topical foam Apply 1 application topically daily as needed (psoriasis).   . cyclobenzaprine (FLEXERIL) 10 MG tablet Take 1 tablet (10 mg total) by mouth 3 (three) times daily as needed  for muscle spasms.  . fluticasone (FLONASE) 50 MCG/ACT nasal spray SPRAY TWICE INTO EACH NOSTRIL EVERY DAY  . hydrochlorothiazide (HYDRODIURIL) 25 MG tablet TAKE 1 TABLET BY MOUTH EVERY DAY  . Ketotifen Fumarate (ALLERGY EYE DROPS OP) Place 1 drop into both eyes 2 (two) times daily as needed (allergies).   [name] letrozole (FEMARA) 2.5 MG tablet Take 1 tablet (2.5 mg total) by mouth daily.  [name] losartan (COZAAR) 100 MG tablet Take 0.5 tablets (50 mg total) by mouth daily.  . Multiple Vitamin (MULTIVITAMIN WITH MINERALS) TABS tablet Take 1  tablet by mouth daily.  . [DISCONTINUED] HYDROcodone-acetaminophen (NORCO) 5-325 MG tablet Take 1 tablet by mouth every 6 (six) hours as needed for moderate pain.  . [DISCONTINUED] ibuprofen (ADVIL,MOTRIN) 200 MG tablet Take 400 mg by mouth every 6 (six) hours as needed for headache (pain).  [name] HYDROcodone-acetaminophen (NORCO/VICODIN) 5-325 MG tablet Take 1 tablet by mouth every 6 (six) hours as needed for severe pain. For AFTER surgery only, do not take and drive  . ibuprofen (ADVIL) 600 MG tablet Take 1 tablet (600 mg total) by mouth every 6 (six) hours as needed for moderate pain. For AFTER surgery only  . senna-docusate (SENOKOT-S) 8.6-50 MG tablet Take 2 tablets by mouth at bedtime. For AFTER surgery, do not take if having diarrhea  . [DISCONTINUED] HYDROcodone-acetaminophen (NORCO/VICODIN) 5-325 MG tablet Take 1 tablet by mouth every 6 (six) hours as needed for severe pain. For AFTER surgery only, do not take and drive  . [DISCONTINUED] predniSONE (DELTASONE) 20 MG tablet 40mg  x 2 days, 20mg  x 2 days, 10mg  x 2 days, then stop (Patient not taking: Reported on 09/29/2018)   No facility-administered encounter medications on file as of 09/29/2018.     Allergy: No Known Allergies  Social Hx:   Social History   Socioeconomic History  . Marital status: Married    Spouse name: Not on file  . Number of children: Not on file  . Years of education: Not on file  . Highest education level: Not on file  Occupational History  . Not on file  Social Needs  . Financial resource strain: Not on file  . Food insecurity    Worry: Not on file    Inability: Not on file  . Transportation needs    Medical: Not on file    Non-medical: Not on file  Tobacco Use  . Smoking status: Current Every Day Smoker    Packs/day: 0.50    Years: 20.00    Pack years: 10.00    Types: Cigarettes  . Smokeless tobacco: Never Used  Substance and Sexual Activity  . Alcohol use: Yes    Alcohol/week: 10.0 standard drinks     Types: 10 Glasses of wine per week    Comment: Glasses of wine several times a week  . Drug use: No  . Sexual activity: Yes    Birth control/protection: Surgical    Comment: married  Lifestyle  . Physical activity    Days per week: Not on file    Minutes per session: Not on file  . Stress: Not on file  Relationships  . Social Herbalist on phone: Not on file    Gets together: Not on file    Attends religious service: Not on file    Active member of club or organization: Not on file    Attends meetings of clubs or organizations: Not on file    Relationship status: Not on file  .  Intimate partner violence    Fear of current or ex partner: Not on file    Emotionally abused: Not on file    Physically abused: Not on file    Forced sexual activity: Not on file  Other Topics Concern  . Not on file  Social History Narrative  . Not on file    Past Surgical Hx:  Past Surgical History:  Procedure Laterality Date  . ABDOMINAL HYSTERECTOMY    . BREAST BIOPSY Left 06/2015  . BREAST LUMPECTOMY Left 01/2016  . BREAST LUMPECTOMY WITH RADIOACTIVE SEED AND SENTINEL LYMPH NODE BIOPSY Left 01/08/2016   Procedure: LEFT BREAST LUMPECTOMY WITH RADIOACTIVE SEED AND SENTINEL LYMPH NODE BIOPSY, BLUE DYE INJECTION;  Surgeon: Rolm Bookbinder, MD;  Location: [facility];  Service: General;  Laterality: Left;  . cervical acdf     C5-6- fusion with plates/screws "Dr. Sherwood Gambler" '12  . COLONOSCOPY WITH PROPOFOL N/A 04/16/2014   Procedure: COLONOSCOPY WITH PROPOFOL;  Surgeon: Garlan Fair, MD;  Location: [facility];  Service: Endoscopy;  Laterality: N/A;  . COLPOSCOPY    . laporotomy     Ovarian cyst  . PORT-A-CATH REMOVAL Right 01/08/2016   Procedure: REMOVAL PORT-A-CATH;  Surgeon: Rolm Bookbinder, MD;  Location: [facility];  Service: General;  Laterality: Right;  . PORTACATH PLACEMENT N/A 07/24/2015   Procedure: INSERTION PORT-A-CATH WITH [place];  Surgeon:  Rolm Bookbinder, MD;  Location: [facility];  Service: General;  Laterality: N/A;  . TONSILLECTOMY     child    Past Medical Hx:  Past Medical History:  Diagnosis Date  . Allergy   . Anxiety    mild no regular meds  . Arthritis    psoriatic arthritis -back  . Breast cancer ([facility]) 06/25/15 bx   left breastinvasive ductal ca ,grade 1  . Episcleritis   . Hormone replacement therapy   . Hypertension   . Left carotid artery stenosis    40-59% (01/2015)  . Personal history of chemotherapy 2017   neoadjuvant  . Personal history of radiation therapy 2018  . Psoriasis   . Smoker     Past Gynecological History:  See HPI No LMP recorded. Patient has had a hysterectomy.  Family Hx:  Family History  Problem Relation Age of Onset  . Stroke Mother     Review of Systems:  Constitutional  Feels well,   ENT Normal appearing ears and nares bilaterally Skin/Breast  No rash, sores, jaundice, itching, dryness Cardiovascular  No chest pain, shortness of breath, or edema  Pulmonary  No cough or wheeze.  Gastro Intestinal  No nausea, vomitting, or diarrhoea. No bright red blood per rectum, no abdominal pain, change in bowel movement, or constipation.  Genito Urinary  No frequency, urgency, dysuria, no bleeding Musculo Skeletal  No myalgia, arthralgia, joint swelling or pain  Neurologic  No weakness, numbness, change in gait,  Psychology  No depression, anxiety, insomnia.   Vitals:  Blood pressure 138/77, pulse 78, temperature 97.8 F (36.6 C), temperature source Temporal, resp. rate 16, height 5\' 4"  (1.626 m), weight 111 lb (50.3 kg), SpO2 100 %.  Physical Exam: WD in NAD Neck  Supple NROM, without any enlargements.  Lymph Node Survey No cervical supraclavicular or inguinal adenopathy Cardiovascular  Pulse normal rate, regularity and rhythm. S1 and S2 normal.  Lungs  Clear to auscultation bilateraly, without wheezes/crackles/rhonchi. Good air movement.  Skin  No  rash/lesions/breakdown  Psychiatry  Alert and oriented to person, place, and time  Abdomen  Normoactive bowel sounds, abdomen soft, non-tender and thin without evidence of hernia. Back No CVA tenderness Genito Urinary  Vulva/vagina: Normal external female genitalia.  No lesions. No discharge or bleeding.  Bladder/urethra:  No lesions or masses, well supported bladder  Vagina: normal, smooth, narrow.   Cervix and Uterus: surgically absent   Adnexa: Cystic smooth fullness in the left adnexa appreciated.  Rectal  deferred Extremities  No bilateral cyanosis, clubbing or edema.   Thereasa Solo, MD  09/29/2018, 5:59 PM

## 2018-09-29 NOTE — Telephone Encounter (Signed)
Told Ms Wunderlin that there was no blood in her urinalysis today. She does not need a urology consult at this time. She is to proceed with surgery as scheduled. Pt verbalized understanding.

## 2018-09-29 NOTE — Patient Instructions (Signed)
Preparing for your Surgery  Plan for surgery on October 10, 2018 with Dr. Everitt Amber at [place] will be scheduled for a robotic assisted bilateral salpingo-oophorectomy.   STOP ASPIRIN NOW.  WE WILL BE CHECKING A URINALYSIS TODAY AND EVALUATING FOR ANY PRESENCE OF BLOOD. IF PRESENT, WE WILL REFER YOU TO UROLOGY BEFORE SURGERY.  Pre-operative Testing -You will receive a phone call from presurgical testing at [facility] if you have not received a call already to arrange for a pre-operative testing appointment before your surgery.  This appointment normally occurs one to two weeks before your scheduled surgery.   -Bring your insurance card, copy of an advanced directive if applicable, medication list  -At that visit, you will be asked to sign a consent for a possible blood transfusion in case a transfusion becomes necessary during surgery.  The need for a blood transfusion is rare but having consent is a necessary part of your care.     -Do not take supplements such as fish oil (omega 3), red yeast rice, tumeric before your surgery.  Day Before Surgery at [place] will be asked to take in a light diet the day before surgery.  Avoid carbonated beverages.  You will be advised to have nothing to eat or drink after midnight the evening before.    Eat a light diet the day before surgery.  Examples including soups, broths, toast, yogurt, mashed potatoes.  Things to avoid include carbonated beverages (fizzy beverages), raw fruits and raw vegetables, or beans.   If your bowels are filled with gas, your surgeon will have difficulty visualizing your pelvic organs which increases your surgical risks.  Your role in recovery Your role is to become active as soon as directed by your doctor, while still giving yourself time to heal.  Rest when you feel tired. You will be asked to do the following in order to speed your recovery:  - Cough and breathe deeply. This helps toclear and  expand your lungs and can prevent pneumonia.  - Do mild physical activity. Walking or moving your legs help your circulation and body functions return to normal. A staff member will help you when you try to walk and will provide you with simple exercises. Do not try to get up or walk alone the first time. - Actively manage your pain. Managing your pain lets you move in comfort. We will ask you to rate your pain on a scale of zero to 10. It is your responsibility to tell your doctor or nurse where and how much you hurt so your pain can be treated.  Special Considerations -If you are diabetic, you may be placed on insulin after surgery to have closer control over your blood sugars to promote healing and recovery.  This does not mean that you will be discharged on insulin.  If applicable, your oral antidiabetics will be resumed when you are tolerating a solid diet.  -Your final pathology results from surgery should be available around one week after surgery and the results will be relayed to you when available.  -Dr. Lahoma Crocker is the surgeon that assists your GYN Oncologist with surgery.  If you end up staying the night, the next day after your surgery you will either see Dr. Denman George or Dr. Lahoma Crocker.  -FMLA forms can be faxed to [phone] and please allow 5-7 business days for completion.  Pain Management After Surgery -You have been prescribed your pain medication and bowel regimen  medications before surgery so that you can have these available when you are discharged from the hospital. The pain medication is for use ONLY AFTER surgery and a new prescription will not be given.   -Make sure that you have Tylenol and Ibuprofen at home to use on a regular basis after surgery for pain control. We recommend alternating the medications every hour to six hours since they work differently and are processed in the body differently for pain relief.  -Review the attached handout on narcotic  use and their risks and side effects.   Bowel Regimen -You have been prescribed Sennakot-S to take nightly to prevent constipation especially if you are taking the narcotic pain medication intermittently.  It is important to prevent constipation and drink adequate amounts of liquids.  Blood Transfusion Information WHAT IS A BLOOD TRANSFUSION? A transfusion is the replacement of blood or some of its parts. Blood is made up of multiple cells which provide different functions.  Red blood cells carry oxygen and are used for blood loss replacement.  White blood cells fight against infection.  Platelets control bleeding.  Plasma helps clot blood.  Other blood products are available for specialized needs, such as hemophilia or other clotting disorders. BEFORE THE TRANSFUSION  Who gives blood for transfusions?   You may be able to donate blood to be used at a later date on yourself (autologous donation).  Relatives can be asked to donate blood. This is generally not any safer than if you have received blood from a stranger. The same precautions are taken to ensure safety when a relative's blood is donated.  Healthy volunteers who are fully evaluated to make sure their blood is safe. This is blood bank blood. Transfusion therapy is the safest it has ever been in the practice of medicine. Before blood is taken from a donor, a complete history is taken to make sure that person has no history of diseases nor engages in risky social behavior (examples are intravenous drug use or sexual activity with multiple partners). The donor's travel history is screened to minimize risk of transmitting infections, such as malaria. The donated blood is tested for signs of infectious diseases, such as HIV and hepatitis. The blood is then tested to be sure it is compatible with you in order to minimize the chance of a transfusion reaction. If you or a relative donates blood, this is often done in anticipation of  surgery and is not appropriate for emergency situations. It takes many days to process the donated blood. RISKS AND COMPLICATIONS Although transfusion therapy is very safe and saves many lives, the main dangers of transfusion include:   Getting an infectious disease.  Developing a transfusion reaction. This is an allergic reaction to something in the blood you were given. Every precaution is taken to prevent this. The decision to have a blood transfusion has been considered carefully by your caregiver before blood is given. Blood is not given unless the benefits outweigh the risks.  AFTER SURGERY INSTRUCTIONS  09/29/2018  Return to work: 4-6 weeks if applicable  Activity: 1. Be up and out of the bed during the day.  Take a nap if needed.  You may walk up steps but be careful and use the hand rail.  Stair climbing will tire you more than you think, you may need to stop part way and rest.   2. No lifting or straining for 6 weeks.  3. No driving for 1 week(s).  Do not drive if  you are taking narcotic pain medicine.  4. Shower daily.  Use soap and water on your incision and pat dry; don't rub.  No tub baths until cleared by your surgeon.   5. You may experience a small amount of clear drainage from your incisions, which is normal.  If the drainage persists or increases, please call the office.  6. Take Tylenol or ibuprofen first for pain and only use NARCOTIC for severe pain not relieved by the Tylenol or Ibuprofen.  Monitor your Tylenol intake to a max of 4,000 mg a day.  Diet: 1. Low sodium Heart Healthy Diet is recommended.  2. It is safe to use a laxative, such as Miralax or Colace, if you have difficulty moving your bowels. You can take Sennakot at bedtime every evening to keep bowel movements regular and to prevent constipation.    Wound Care: 1. Keep clean and dry.  Shower daily.  Reasons to call the Doctor:  Fever - Oral temperature greater than 100.4 degrees Fahrenheit   Foul-smelling vaginal discharge  Difficulty urinating  Nausea and vomiting  Increased pain at the site of the incision that is unrelieved with pain medicine.  Difficulty breathing with or without chest pain  New calf pain especially if only on one side  Sudden, continuing increased vaginal bleeding with or without clots.   Contacts: For questions or concerns you should contact:  Dr. Everitt Amber at [phone]  Joylene John, NP at [phone]  After Hours: call [phone] and have the GYN Oncologist paged/contacted

## 2018-10-05 NOTE — Patient Instructions (Addendum)
DUE TO COVID-19 ONLY ONE VISITOR IS ALLOWED TO COME WITH YOU AND STAY IN THE WAITING ROOM ONLY DURING PRE OP AND PROCEDURE DAY OF SURGERY. THE 1 VISITOR MAY VISIT WITH YOU AFTER SURGERY IN YOUR PRIVATE ROOM DURING VISITING HOURS ONLY!  YOU NEED TO HAVE A COVID 19 TEST ON_Friday 10/02/2020______ @__10 :50am_____, THIS TEST MUST BE DONE BEFORE SURGERY, COME  [facility], [address] [place] , [address].  ([place]) ONCE YOUR COVID TEST IS COMPLETED, PLEASE BEGIN THE QUARANTINE INSTRUCTIONS AS OUTLINED IN YOUR HANDOUT.                [facility]     Your procedure is scheduled on: Tuesday 10/10/2018   Report to [facility] Main  Entrance              Report to admitting at  0600  AM     Call this number if you have problems the morning of surgery [phone]    Remember: Do not eat food  :After Midnight. May have clear liquids from midnight up until 0500 am then nothing until after surgery!              Eat a light diet the day before surgery.  Examples including soups, broths, toast, yogurt, mashed potatoes.  Things to avoid include carbonated beverages (fizzy beverages), raw fruits and raw vegetables, or beans.               If your bowels are filled with gas, your surgeon will have difficulty visualizing your pelvic organs which increases your surgical risks.   CLEAR LIQUID DIET   Foods Allowed                                                                     Foods Excluded  Coffee and tea, regular and decaf                             liquids that you cannot  Plain Jell-O any favor except red or purple               see through such as: Fruit ices (not with fruit pulp)                                     milk, soups, orange juice  Iced Popsicles                                    All solid food                                   Cranberry, grape and apple juices Sports drinks like Gatorade Lightly seasoned clear broth or consume(fat free) Sugar,  honey syrup  Sample Menu Breakfast                                Lunch  Supper Cranberry juice                    Beef broth                            Chicken broth Jell-O                                     Grape juice                           Apple juice Coffee or tea                        Jell-O                                      Popsicle                                                Coffee or tea                        Coffee or tea  _____________________________________________________________________                BRUSH YOUR TEETH MORNING OF SURGERY AND RINSE YOUR MOUTH OUT, NO CHEWING GUM CANDY OR MINTS.     Take these medicines the morning of surgery with A SIP OF WATER: Atenolol (Tenormin), Atorvastatin (Lipitor), Letrozole (Femara), use Flonase nasal spray                                 You may not have any metal on your body including hair pins and              piercings  Do not wear jewelry, make-up, lotions, powders or perfumes, deodorant             Do not wear nail polish on your fingernails.  Do not shave  48 hours prior to surgery.         Do not bring valuables to the hospital. [place].  Contacts, dentures or bridgework may not be worn into surgery.  Leave suitcase in the car. After surgery it may be brought to your room.     Patients discharged the day of surgery will not be allowed to drive home. IF YOU ARE HAVING SURGERY AND GOING HOME THE SAME DAY, YOU MUST HAVE AN ADULT TO DRIVE YOU HOME AND BE WITH YOU FOR 24 HOURS. YOU MAY GO HOME BY TAXI OR UBER OR ORTHERWISE, BUT AN ADULT MUST ACCOMPANY YOU HOME AND STAY WITH YOU FOR 24 HOURS.  Name and phone number of your driver:                Please read over the following fact sheets you were given: _____________________________________________________________________             [facility] -  Preparing for  Surgery Before surgery, you can play an important role.  Because skin is not sterile, your skin needs to be as free of germs as possible.  You can reduce the number of germs on your skin by washing with CHG (chlorahexidine gluconate) soap before surgery.  CHG is an antiseptic cleaner which kills germs and bonds with the skin to continue killing germs even after washing. Please DO NOT use if you have an allergy to CHG or antibacterial soaps.  If your skin becomes reddened/irritated stop using the CHG and inform your nurse when you arrive at Short Stay. Do not shave (including legs and underarms) for at least 48 hours prior to the first CHG shower.  You may shave your face/neck. Please follow these instructions carefully:  1.  Shower with CHG Soap the night before surgery and the  morning of Surgery.  2.  If you choose to wash your hair, wash your hair first as usual with your  normal  shampoo.  3.  After you shampoo, rinse your hair and body thoroughly to remove the  shampoo.                           4.  Use CHG as you would any other liquid soap.  You can apply chg directly  to the skin and wash                       Gently with a scrungie or clean washcloth.  5.  Apply the CHG Soap to your body ONLY FROM THE NECK DOWN.   Do not use on face/ open                           Wound or open sores. Avoid contact with eyes, ears mouth and genitals (private parts).                       Wash face,  Genitals (private parts) with your normal soap.             6.  Wash thoroughly, paying special attention to the area where your surgery  will be performed.  7.  Thoroughly rinse your body with warm water from the neck down.  8.  DO NOT shower/wash with your normal soap after using and rinsing off  the CHG Soap.                9.  Pat yourself dry with a clean towel.            10.  Wear clean pajamas.            11.  Place clean sheets on your bed the night of your first shower and do not  sleep with pets. Day  of Surgery : Do not apply any lotions/deodorants the morning of surgery.  Please wear clean clothes to the hospital/surgery center.  FAILURE TO FOLLOW THESE INSTRUCTIONS MAY RESULT IN THE CANCELLATION OF YOUR SURGERY PATIENT SIGNATURE_________________________________  NURSE SIGNATURE__________________________________  ________________________________________________________________________   Adam Phenix  An incentive spirometer is a tool that can help keep your lungs clear and active. This tool measures how well you are filling your lungs with each breath. Taking long deep breaths may help reverse or decrease the chance of developing breathing (pulmonary) problems (especially infection) following:  A long period of time when you  are unable to move or be active. BEFORE THE PROCEDURE   If the spirometer includes an indicator to show your best effort, your nurse or respiratory therapist will set it to a desired goal.  If possible, sit up straight or lean slightly forward. Try not to slouch.  Hold the incentive spirometer in an upright position. INSTRUCTIONS FOR USE  1. Sit on the edge of your bed if possible, or sit up as far as you can in bed or on a chair. 2. Hold the incentive spirometer in an upright position. 3. Breathe out normally. 4. Place the mouthpiece in your mouth and seal your lips tightly around it. 5. Breathe in slowly and as deeply as possible, raising the piston or the ball toward the top of the column. 6. Hold your breath for 3-5 seconds or for as long as possible. Allow the piston or ball to fall to the bottom of the column. 7. Remove the mouthpiece from your mouth and breathe out normally. 8. Rest for a few seconds and repeat Steps 1 through 7 at least 10 times every 1-2 hours when you are awake. Take your time and take a few normal breaths between deep breaths. 9. The spirometer may include an indicator to show your best effort. Use the indicator as a goal  to work toward during each repetition. 10. After each set of 10 deep breaths, practice coughing to be sure your lungs are clear. If you have an incision (the cut made at the time of surgery), support your incision when coughing by placing a pillow or rolled up towels firmly against it. Once you are able to get out of bed, walk around indoors and cough well. You may stop using the incentive spirometer when instructed by your caregiver.  RISKS AND COMPLICATIONS  Take your time so you do not get dizzy or light-headed.  If you are in pain, you may need to take or ask for pain medication before doing incentive spirometry. It is harder to take a deep breath if you are having pain. AFTER USE  Rest and breathe slowly and easily.  It can be helpful to keep track of a log of your progress. Your caregiver can provide you with a simple table to help with this. If you are using the spirometer at home, follow these instructions: [place] IF:   You are having difficultly using the spirometer.  You have trouble using the spirometer as often as instructed.  Your pain medication is not giving enough relief while using the spirometer.  You develop fever of 100.5 F (38.1 C) or higher. SEEK IMMEDIATE MEDICAL CARE IF:   You cough up bloody sputum that had not been present before.  You develop fever of 102 F (38.9 C) or greater.  You develop worsening pain at or near the incision site. MAKE SURE YOU:   Understand these instructions.  Will watch your condition.  Will get help right away if you are not doing well or get worse. Document Released: 05/03/2006 Document Revised: 03/15/2011 Document Reviewed: 07/04/2006 Exit[employer] Patient Information 2014 ExitCare, [place].   ________________________________________________________________________  WHAT IS A BLOOD TRANSFUSION? Blood Transfusion Information  A transfusion is the replacement of blood or some of its parts. Blood is made up of  multiple cells which provide different functions.  Red blood cells carry oxygen and are used for blood loss replacement.  White blood cells fight against infection.  Platelets control bleeding.  Plasma helps clot blood.  Other blood  products are available for specialized needs, such as hemophilia or other clotting disorders. BEFORE THE TRANSFUSION  Who gives blood for transfusions?   Healthy volunteers who are fully evaluated to make sure their blood is safe. This is blood bank blood. Transfusion therapy is the safest it has ever been in the practice of medicine. Before blood is taken from a donor, a complete history is taken to make sure that person has no history of diseases nor engages in risky social behavior (examples are intravenous drug use or sexual activity with multiple partners). The donor's travel history is screened to minimize risk of transmitting infections, such as malaria. The donated blood is tested for signs of infectious diseases, such as HIV and hepatitis. The blood is then tested to be sure it is compatible with you in order to minimize the chance of a transfusion reaction. If you or a relative donates blood, this is often done in anticipation of surgery and is not appropriate for emergency situations. It takes many days to process the donated blood. RISKS AND COMPLICATIONS Although transfusion therapy is very safe and saves many lives, the main dangers of transfusion include:   Getting an infectious disease.  Developing a transfusion reaction. This is an allergic reaction to something in the blood you were given. Every precaution is taken to prevent this. The decision to have a blood transfusion has been considered carefully by your caregiver before blood is given. Blood is not given unless the benefits outweigh the risks. AFTER THE TRANSFUSION  Right after receiving a blood transfusion, you will usually feel much better and more energetic. This is especially true if  your red blood cells have gotten low (anemic). The transfusion raises the level of the red blood cells which carry oxygen, and this usually causes an energy increase.  The nurse administering the transfusion will monitor you carefully for complications. HOME CARE INSTRUCTIONS  No special instructions are needed after a transfusion. You may find your energy is better. Speak with your caregiver about any limitations on activity for underlying diseases you may have. SEEK MEDICAL CARE IF:   Your condition is not improving after your transfusion.  You develop redness or irritation at the intravenous (IV) site. SEEK IMMEDIATE MEDICAL CARE IF:  Any of the following symptoms occur over the next 12 hours:  Shaking chills.  You have a temperature by mouth above 102 F (38.9 C), not controlled by medicine.  Chest, back, or muscle pain.  People around you feel you are not acting correctly or are confused.  Shortness of breath or difficulty breathing.  Dizziness and fainting.  You get a rash or develop hives.  You have a decrease in urine output.  Your urine turns a dark color or changes to pink, red, or brown. Any of the following symptoms occur over the next 10 days:  You have a temperature by mouth above 102 F (38.9 C), not controlled by medicine.  Shortness of breath.  Weakness after normal activity.  The white part of the eye turns yellow (jaundice).  You have a decrease in the amount of urine or are urinating less often.  Your urine turns a dark color or changes to pink, red, or brown. Document Released: 12/19/1999 Document Revised: 03/15/2011 Document Reviewed: 08/07/2007 [facility] Patient Information 2014 [place], [place].  _______________________________________________________________________

## 2018-10-06 ENCOUNTER — Other Ambulatory Visit: Payer: Self-pay

## 2018-10-06 ENCOUNTER — Other Ambulatory Visit (HOSPITAL_COMMUNITY)
Admission: RE | Admit: 2018-10-06 | Discharge: 2018-10-06 | Disposition: A | Discharge status: Home / Self Care | Payer: BC Managed Care – PPO | Source: Ambulatory Visit | Attending: Gynecologic Oncology | Admitting: Gynecologic Oncology

## 2018-10-06 ENCOUNTER — Other Ambulatory Visit (HOSPITAL_COMMUNITY): Payer: BC Managed Care – PPO

## 2018-10-06 ENCOUNTER — Encounter (HOSPITAL_COMMUNITY)
Admission: RE | Admit: 2018-10-06 | Discharge: 2018-10-06 | Disposition: A | Discharge status: Home / Self Care | Payer: BC Managed Care – PPO | Source: Ambulatory Visit | Attending: Gynecologic Oncology | Admitting: Gynecologic Oncology

## 2018-10-06 ENCOUNTER — Encounter (HOSPITAL_COMMUNITY): Payer: Self-pay

## 2018-10-06 DIAGNOSIS — N83209 Unspecified ovarian cyst, unspecified side: Secondary | ICD-10-CM | POA: Insufficient documentation

## 2018-10-06 DIAGNOSIS — Z20828 Contact with and (suspected) exposure to other viral communicable diseases: Secondary | ICD-10-CM | POA: Insufficient documentation

## 2018-10-06 DIAGNOSIS — Z01812 Encounter for preprocedural laboratory examination: Secondary | ICD-10-CM | POA: Insufficient documentation

## 2018-10-06 LAB — BASIC METABOLIC PANEL
Anion gap: 12 (ref 5–15)
BUN: 13 mg/dL (ref 8–23)
CO2: 27 mmol/L (ref 22–32)
Calcium: 9.6 mg/dL (ref 8.9–10.3)
Chloride: 91 mmol/L — ABNORMAL LOW (ref 98–111)
Creatinine, Ser: 0.78 mg/dL (ref 0.44–1.00)
GFR calc Af Amer: >60 mL/min (ref >60)
GFR calc non Af Amer: >60 mL/min (ref >60)
Glucose, Bld: 95 mg/dL (ref 70–99)
Potassium: 4.2 mmol/L (ref 3.5–5.1)
Sodium: 130 mmol/L — ABNORMAL LOW (ref 135–145)

## 2018-10-06 LAB — CBC
HCT: 33.9 % — ABNORMAL LOW (ref 36.0–46.0)
Hemoglobin: 11.6 g/dL — ABNORMAL LOW (ref 12.0–15.0)
MCH: 33.3 pg (ref 26.0–34.0)
MCHC: 34.2 g/dL (ref 30.0–36.0)
MCV: 97.4 fL (ref 80.0–100.0)
Platelets: 343 10*3/uL (ref 150–400)
RBC: 3.48 MIL/uL — ABNORMAL LOW (ref 3.87–5.11)
RDW: 13.1 % (ref 11.5–15.5)
WBC: 10.6 10*3/uL — ABNORMAL HIGH (ref 4.0–10.5)
nRBC: 0 % (ref 0.0–0.2)

## 2018-10-06 LAB — ABO/RH: ABO/RH(D): A POS

## 2018-10-06 NOTE — Progress Notes (Addendum)
PCP - Dr. Jenna Luo Cardiologist - none  Chest x-ray - none EKG - 10/06/2018 Stress Test - none ECHO - 10/17/2015 Cardiac Cath - none Sleep Study - none CPAP - none  Fasting Blood Sugar -  Checks Blood Sugar _____ times a day  Blood Thinner Instructions: Aspirin Instructions: Hold until 1 week after surgery Last Dose: 09/29/2018  Anesthesia review: Chart given to Iver Nestle PA-C for review.   Patient denies shortness of breath, fever, cough and chest pain at PAT appointment   Patient verbalized understanding of instructions that were given to them at the PAT appointment. Patient was also instructed that they will need to review over the PAT instructions again at home before surgery.

## 2018-10-07 LAB — CA 125: Cancer Antigen (CA) 125: 3.7 U/mL (ref 0.0–38.1)

## 2018-10-08 LAB — NOVEL CORONAVIRUS, NAA (HOSP ORDER, SEND-OUT TO REF LAB; TAT 18-24 HRS): SARS-CoV-2, NAA: NOT DETECTED

## 2018-10-09 ENCOUNTER — Telehealth: Payer: Self-pay

## 2018-10-09 NOTE — Telephone Encounter (Signed)
I spoke with Kim Ortega this am. She is ready for surgery tomorrow.  I informed her the covid test was negative.  She verbalized understanding.

## 2018-10-09 NOTE — Telephone Encounter (Signed)
I spoke with Kim Ortega.  I told her her CA 125 level was 3.7 which is normal.  I told her her Na and Cl were low and instructed her to increase her salt intake as per Joylene John, NP.  I told her her WBC was slightly elevated at 10.6.  She is afebrile and denies any s/s of infection.

## 2018-10-09 NOTE — Anesthesia Preprocedure Evaluation (Addendum)
Anesthesia Evaluation  Patient identified by MRN, date of birth, ID band Patient awake    Reviewed: Allergy & Precautions, NPO status , Patient's Chart, lab work & pertinent test results  History of Anesthesia Complications Negative for: history of anesthetic complications  Airway Mallampati: II  TM Distance: >3 FB Neck ROM: Full    Dental  (+) Teeth Intact   Pulmonary Current Smoker,    Pulmonary exam normal        Cardiovascular hypertension, Pt. on medications and Pt. on home beta blockers Normal cardiovascular exam     Neuro/Psych PSYCHIATRIC DISORDERS Anxiety Carotid stenosis    GI/Hepatic negative GI ROS, Neg liver ROS,   Endo/Other  negative endocrine ROS  Renal/GU negative Renal ROS  negative genitourinary   Musculoskeletal negative musculoskeletal ROS (+)   Abdominal   Peds  Hematology negative hematology ROS (+)   Anesthesia Other Findings Breast cancer s/p chemo/xrt  Reproductive/Obstetrics                            Anesthesia Physical Anesthesia Plan  ASA: II  Anesthesia Plan: General   Post-op Pain Management:    Induction: Intravenous  PONV Risk Score and Plan: 3 and Ondansetron, Dexamethasone, Treatment may vary due to age or medical condition and Midazolam  Airway Management Planned: Oral ETT  Additional Equipment: None  Intra-op Plan:   Post-operative Plan: Extubation in OR  Informed Consent: I have reviewed the patients History and Physical, chart, labs and discussed the procedure including the risks, benefits and alternatives for the proposed anesthesia with the patient or authorized representative who has indicated his/her understanding and acceptance.     Dental advisory given  Plan Discussed with:   Anesthesia Plan Comments:        Anesthesia Quick Evaluation

## 2018-10-10 ENCOUNTER — Ambulatory Visit (HOSPITAL_COMMUNITY)
Admission: RE | Admit: 2018-10-10 | Discharge: 2018-10-10 | Disposition: A | Discharge status: Home / Self Care | Payer: BC Managed Care – PPO | Attending: Gynecologic Oncology | Admitting: Gynecologic Oncology

## 2018-10-10 ENCOUNTER — Ambulatory Visit (HOSPITAL_COMMUNITY): Payer: BC Managed Care – PPO | Admitting: Physician Assistant

## 2018-10-10 ENCOUNTER — Encounter (HOSPITAL_COMMUNITY)
Admission: RE | Disposition: A | Discharge status: Home / Self Care | Payer: Self-pay | Source: Home / Self Care | Attending: Gynecologic Oncology

## 2018-10-10 ENCOUNTER — Ambulatory Visit (HOSPITAL_COMMUNITY): Payer: BC Managed Care – PPO | Admitting: Certified Registered Nurse Anesthetist

## 2018-10-10 ENCOUNTER — Encounter (HOSPITAL_COMMUNITY): Payer: Self-pay | Admitting: General Practice

## 2018-10-10 DIAGNOSIS — Z853 Personal history of malignant neoplasm of breast: Secondary | ICD-10-CM | POA: Insufficient documentation

## 2018-10-10 DIAGNOSIS — Z7982 Long term (current) use of aspirin: Secondary | ICD-10-CM | POA: Diagnosis not present

## 2018-10-10 DIAGNOSIS — N83202 Unspecified ovarian cyst, left side: Secondary | ICD-10-CM | POA: Insufficient documentation

## 2018-10-10 DIAGNOSIS — I10 Essential (primary) hypertension: Secondary | ICD-10-CM | POA: Diagnosis not present

## 2018-10-10 DIAGNOSIS — N83209 Unspecified ovarian cyst, unspecified side: Secondary | ICD-10-CM

## 2018-10-10 DIAGNOSIS — Z7951 Long term (current) use of inhaled steroids: Secondary | ICD-10-CM | POA: Diagnosis not present

## 2018-10-10 DIAGNOSIS — F17210 Nicotine dependence, cigarettes, uncomplicated: Secondary | ICD-10-CM | POA: Diagnosis not present

## 2018-10-10 DIAGNOSIS — Z79899 Other long term (current) drug therapy: Secondary | ICD-10-CM | POA: Diagnosis not present

## 2018-10-10 DIAGNOSIS — R1900 Intra-abdominal and pelvic swelling, mass and lump, unspecified site: Secondary | ICD-10-CM

## 2018-10-10 HISTORY — PX: ROBOTIC ASSISTED SALPINGO OOPHERECTOMY: SHX6082

## 2018-10-10 LAB — TYPE AND SCREEN
ABO/RH(D): A POS
Antibody Screen: NEGATIVE

## 2018-10-10 LAB — BASIC METABOLIC PANEL
Anion gap: 17 — ABNORMAL HIGH (ref 5–15)
BUN: 8 mg/dL (ref 8–23)
CO2: 21 mmol/L — ABNORMAL LOW (ref 22–32)
Calcium: 9.4 mg/dL (ref 8.9–10.3)
Chloride: 90 mmol/L — ABNORMAL LOW (ref 98–111)
Creatinine, Ser: 0.65 mg/dL (ref 0.44–1.00)
GFR calc Af Amer: >60 mL/min (ref >60)
GFR calc non Af Amer: >60 mL/min (ref >60)
Glucose, Bld: 99 mg/dL (ref 70–99)
Potassium: 3 mmol/L — ABNORMAL LOW (ref 3.5–5.1)
Sodium: 128 mmol/L — ABNORMAL LOW (ref 135–145)

## 2018-10-10 SURGERY — SALPINGO-OOPHORECTOMY, ROBOT-ASSISTED
Anesthesia: General | Laterality: Bilateral

## 2018-10-10 MED ORDER — CELECOXIB 200 MG PO CAPS
400.0000 mg | ORAL_CAPSULE | ORAL | Status: AC
  Administered 2018-10-10: 07:00:00 400 mg via ORAL
  Filled 2018-10-10: qty 2

## 2018-10-10 MED ORDER — EPHEDRINE 5 MG/ML INJ
INTRAVENOUS | Status: AC
  Filled 2018-10-10: qty 10

## 2018-10-10 MED ORDER — MIDAZOLAM HCL 2 MG/2ML IJ SOLN
INTRAMUSCULAR | Status: AC
  Filled 2018-10-10: qty 2

## 2018-10-10 MED ORDER — ONDANSETRON HCL 4 MG/2ML IJ SOLN: 4.0000 mg | Freq: Once | INTRAMUSCULAR | Status: DC | PRN

## 2018-10-10 MED ORDER — SUGAMMADEX SODIUM 200 MG/2ML IV SOLN
INTRAVENOUS | Status: DC | PRN
  Administered 2018-10-10: 110 mg via INTRAVENOUS

## 2018-10-10 MED ORDER — ONDANSETRON HCL 4 MG/2ML IJ SOLN
INTRAMUSCULAR | Status: AC
  Filled 2018-10-10: qty 2

## 2018-10-10 MED ORDER — EPHEDRINE SULFATE-NACL 50-0.9 MG/10ML-% IV SOSY
PREFILLED_SYRINGE | INTRAVENOUS | Status: DC | PRN
  Administered 2018-10-10: 10 mg via INTRAVENOUS

## 2018-10-10 MED ORDER — BUPIVACAINE HCL 0.25 % IJ SOLN
INTRAMUSCULAR | Status: DC | PRN
  Administered 2018-10-10: 15 mL

## 2018-10-10 MED ORDER — OXYCODONE HCL 5 MG/5ML PO SOLN: 5.0000 mg | Freq: Once | ORAL | Status: AC | PRN

## 2018-10-10 MED ORDER — KETAMINE HCL 10 MG/ML IJ SOLN
INTRAMUSCULAR | Status: DC | PRN
  Administered 2018-10-10: 30 mg via INTRAVENOUS

## 2018-10-10 MED ORDER — KETOROLAC TROMETHAMINE 30 MG/ML IJ SOLN
INTRAMUSCULAR | Status: AC
  Filled 2018-10-10: qty 1

## 2018-10-10 MED ORDER — GABAPENTIN 300 MG PO CAPS
300.0000 mg | ORAL_CAPSULE | ORAL | Status: AC
  Administered 2018-10-10: 07:00:00 300 mg via ORAL
  Filled 2018-10-10: qty 1

## 2018-10-10 MED ORDER — OXYCODONE HCL 5 MG PO TABS
ORAL_TABLET | ORAL | Status: AC
  Filled 2018-10-10: qty 1

## 2018-10-10 MED ORDER — SCOPOLAMINE 1 MG/3DAYS TD PT72
1.0000 | MEDICATED_PATCH | TRANSDERMAL | Status: DC
  Administered 2018-10-10: 1.5 mg via TRANSDERMAL
  Filled 2018-10-10: qty 1

## 2018-10-10 MED ORDER — PROPOFOL 10 MG/ML IV BOLUS
INTRAVENOUS | Status: AC
  Filled 2018-10-10: qty 20

## 2018-10-10 MED ORDER — ONDANSETRON HCL 4 MG/2ML IJ SOLN
INTRAMUSCULAR | Status: DC | PRN
  Administered 2018-10-10: 4 mg via INTRAVENOUS

## 2018-10-10 MED ORDER — PROPOFOL 10 MG/ML IV BOLUS
INTRAVENOUS | Status: DC | PRN
  Administered 2018-10-10: 50 mg via INTRAVENOUS
  Administered 2018-10-10: 130 mg via INTRAVENOUS

## 2018-10-10 MED ORDER — ONDANSETRON HCL 4 MG/2ML IJ SOLN: INTRAMUSCULAR | Status: DC | PRN

## 2018-10-10 MED ORDER — LIDOCAINE HCL 2 % IJ SOLN
INTRAMUSCULAR | Status: AC
  Filled 2018-10-10: qty 20

## 2018-10-10 MED ORDER — LIDOCAINE 2% (20 MG/ML) 5 ML SYRINGE
INTRAMUSCULAR | Status: AC
  Filled 2018-10-10: qty 5

## 2018-10-10 MED ORDER — BUPIVACAINE HCL (PF) 0.25 % IJ SOLN
INTRAMUSCULAR | Status: AC
  Filled 2018-10-10: qty 30

## 2018-10-10 MED ORDER — LACTATED RINGERS IR SOLN
Status: DC | PRN
  Administered 2018-10-10: 1000 mL

## 2018-10-10 MED ORDER — PHENYLEPHRINE 40 MCG/ML (10ML) SYRINGE FOR IV PUSH (FOR BLOOD PRESSURE SUPPORT)
PREFILLED_SYRINGE | INTRAVENOUS | Status: DC | PRN
  Administered 2018-10-10: 40 ug via INTRAVENOUS

## 2018-10-10 MED ORDER — ACETAMINOPHEN 500 MG PO TABS
1000.0000 mg | ORAL_TABLET | ORAL | Status: AC
  Administered 2018-10-10: 07:00:00 1000 mg via ORAL
  Filled 2018-10-10: qty 2

## 2018-10-10 MED ORDER — KETOROLAC TROMETHAMINE 30 MG/ML IJ SOLN
30.0000 mg | Freq: Once | INTRAMUSCULAR | Status: AC
  Administered 2018-10-10: 30 mg via INTRAVENOUS

## 2018-10-10 MED ORDER — LACTATED RINGERS IV SOLN
INTRAVENOUS | Status: DC
  Administered 2018-10-10: 07:00:00 via INTRAVENOUS

## 2018-10-10 MED ORDER — CEFAZOLIN SODIUM-DEXTROSE 2-4 GM/100ML-% IV SOLN
2.0000 g | INTRAVENOUS | Status: AC
  Administered 2018-10-10: 2 g via INTRAVENOUS
  Filled 2018-10-10: qty 100

## 2018-10-10 MED ORDER — FENTANYL CITRATE (PF) 100 MCG/2ML IJ SOLN
INTRAMUSCULAR | Status: DC | PRN
  Administered 2018-10-10 (×2): 50 ug via INTRAVENOUS
  Administered 2018-10-10: 100 ug via INTRAVENOUS

## 2018-10-10 MED ORDER — FENTANYL CITRATE (PF) 100 MCG/2ML IJ SOLN: 25.0000 ug | INTRAMUSCULAR | Status: DC | PRN

## 2018-10-10 MED ORDER — LIDOCAINE 2% (20 MG/ML) 5 ML SYRINGE
INTRAMUSCULAR | Status: DC | PRN
  Administered 2018-10-10: 55 mg via INTRAVENOUS

## 2018-10-10 MED ORDER — LIDOCAINE 20MG/ML (2%) 15 ML SYRINGE OPTIME
INTRAMUSCULAR | Status: DC | PRN
  Administered 2018-10-10: 1.5 mg/kg/h via INTRAVENOUS

## 2018-10-10 MED ORDER — ROCURONIUM BROMIDE 50 MG/5ML IV SOSY
PREFILLED_SYRINGE | INTRAVENOUS | Status: DC | PRN
  Administered 2018-10-10: 50 mg via INTRAVENOUS

## 2018-10-10 MED ORDER — ROCURONIUM BROMIDE 10 MG/ML (PF) SYRINGE
PREFILLED_SYRINGE | INTRAVENOUS | Status: AC
  Filled 2018-10-10: qty 10

## 2018-10-10 MED ORDER — FENTANYL CITRATE (PF) 250 MCG/5ML IJ SOLN
INTRAMUSCULAR | Status: AC
  Filled 2018-10-10: qty 5

## 2018-10-10 MED ORDER — DEXAMETHASONE SODIUM PHOSPHATE 10 MG/ML IJ SOLN
INTRAMUSCULAR | Status: AC
  Filled 2018-10-10: qty 1

## 2018-10-10 MED ORDER — OXYCODONE HCL 5 MG PO TABS
5.0000 mg | ORAL_TABLET | Freq: Once | ORAL | Status: AC | PRN
  Administered 2018-10-10: 5 mg via ORAL

## 2018-10-10 MED ORDER — PHENYLEPHRINE HCL (PRESSORS) 10 MG/ML IV SOLN
INTRAVENOUS | Status: AC
  Filled 2018-10-10: qty 1

## 2018-10-10 MED ORDER — DEXAMETHASONE SODIUM PHOSPHATE 4 MG/ML IJ SOLN
4.0000 mg | INTRAMUSCULAR | Status: AC
  Administered 2018-10-10: 4 mg via INTRAVENOUS

## 2018-10-10 MED ORDER — KETAMINE HCL 10 MG/ML IJ SOLN
INTRAMUSCULAR | Status: AC
  Filled 2018-10-10: qty 1

## 2018-10-10 MED ORDER — MIDAZOLAM HCL 5 MG/5ML IJ SOLN
INTRAMUSCULAR | Status: DC | PRN
  Administered 2018-10-10: 2 mg via INTRAVENOUS

## 2018-10-10 SURGICAL SUPPLY — 64 items
APPLICATOR SURGIFLO ENDO (HEMOSTASIS) IMPLANT
BAG LAPAROSCOPIC 12 15 PORT 16 (BASKET) IMPLANT
BAG RETRIEVAL 12/15 (BASKET)
BLADE SURG SZ10 CARB STEEL (BLADE) IMPLANT
COVER BACK TABLE 60X90IN (DRAPES) ×2 IMPLANT
COVER TIP SHEARS 8 DVNC (MISCELLANEOUS) ×1 IMPLANT
COVER TIP SHEARS 8MM DA VINCI (MISCELLANEOUS) ×1
COVER WAND RF STERILE (DRAPES) IMPLANT
DECANTER SPIKE VIAL GLASS SM (MISCELLANEOUS) ×1 IMPLANT
DERMABOND ADVANCED (GAUZE/BANDAGES/DRESSINGS) ×1
DERMABOND ADVANCED .7 DNX12 (GAUZE/BANDAGES/DRESSINGS) ×1 IMPLANT
DRAPE ARM DVNC X/XI (DISPOSABLE) ×4 IMPLANT
DRAPE COLUMN DVNC XI (DISPOSABLE) ×1 IMPLANT
DRAPE DA VINCI XI ARM (DISPOSABLE) ×4
DRAPE DA VINCI XI COLUMN (DISPOSABLE) ×1
DRAPE SHEET LG 3/4 BI-LAMINATE (DRAPES) ×2 IMPLANT
DRAPE SURG IRRIG POUCH 19X23 (DRAPES) ×2 IMPLANT
DRSG OPSITE POSTOP 4X6 (GAUZE/BANDAGES/DRESSINGS) IMPLANT
DRSG OPSITE POSTOP 4X8 (GAUZE/BANDAGES/DRESSINGS) IMPLANT
ELECT REM PT RETURN 15FT ADLT (MISCELLANEOUS) ×2 IMPLANT
GLOVE BIO SURGEON STRL SZ 6 (GLOVE) ×9 IMPLANT
GLOVE BIO SURGEON STRL SZ 6.5 (GLOVE) ×1 IMPLANT
GLOVE BIO SURGEON STRL SZ7 (GLOVE) ×1 IMPLANT
GLOVE BIOGEL PI IND STRL 7.0 (GLOVE) IMPLANT
GLOVE BIOGEL PI INDICATOR 7.0 (GLOVE) ×1
GOWN STRL REUS W/ TWL LRG LVL3 (GOWN DISPOSABLE) ×4 IMPLANT
GOWN STRL REUS W/TWL LRG LVL3 (GOWN DISPOSABLE) ×6
HOLDER FOLEY CATH W/STRAP (MISCELLANEOUS) ×1 IMPLANT
IRRIG SUCT STRYKERFLOW 2 WTIP (MISCELLANEOUS) ×2
IRRIGATION SUCT STRKRFLW 2 WTP (MISCELLANEOUS) ×1 IMPLANT
KIT PROCEDURE DA VINCI SI (MISCELLANEOUS)
KIT PROCEDURE DVNC SI (MISCELLANEOUS) IMPLANT
KIT TURNOVER KIT A (KITS) ×1 IMPLANT
MANIPULATOR UTERINE 4.5 ZUMI (MISCELLANEOUS) ×2 IMPLANT
NDL SPNL 18GX3.5 QUINCKE PK (NEEDLE) IMPLANT
NEEDLE HYPO 22GX1.5 SAFETY (NEEDLE) IMPLANT
NEEDLE SPNL 18GX3.5 QUINCKE PK (NEEDLE) IMPLANT
OBTURATOR OPTICAL STANDARD 8MM (TROCAR) ×1
OBTURATOR OPTICAL STND 8 DVNC (TROCAR) ×1
OBTURATOR OPTICALSTD 8 DVNC (TROCAR) ×1 IMPLANT
PACK ROBOT GYN CUSTOM WL (TRAY / TRAY PROCEDURE) ×2 IMPLANT
PAD POSITIONING PINK XL (MISCELLANEOUS) ×2 IMPLANT
PORT ACCESS TROCAR AIRSEAL 12 (TROCAR) ×1 IMPLANT
PORT ACCESS TROCAR AIRSEAL 5M (TROCAR) ×1
POUCH SPECIMEN RETRIEVAL 10MM (ENDOMECHANICALS) ×2 IMPLANT
SEAL CANN UNIV 5-8 DVNC XI (MISCELLANEOUS) ×3 IMPLANT
SEAL XI 5MM-8MM UNIVERSAL (MISCELLANEOUS) ×3
SET TRI-LUMEN FLTR TB AIRSEAL (TUBING) ×2 IMPLANT
SPONGE LAP 18X18 X RAY DECT (DISPOSABLE) IMPLANT
SURGIFLO W/THROMBIN 8M KIT (HEMOSTASIS) IMPLANT
SUT MNCRL AB 4-0 PS2 18 (SUTURE) IMPLANT
SUT PDS AB 1 TP1 96 (SUTURE) IMPLANT
SUT VIC AB 0 CT1 27 (SUTURE)
SUT VIC AB 0 CT1 27XBRD ANTBC (SUTURE) IMPLANT
SUT VIC AB 2-0 CT1 27 (SUTURE)
SUT VIC AB 2-0 CT1 TAPERPNT 27 (SUTURE) IMPLANT
SUT VIC AB 4-0 PS2 18 (SUTURE) ×4 IMPLANT
SYR 10ML LL (SYRINGE) IMPLANT
TOWEL OR NON WOVEN STRL DISP B (DISPOSABLE) ×2 IMPLANT
TRAP SPECIMEN MUCOUS 40CC (MISCELLANEOUS) ×1 IMPLANT
TRAY FOLEY MTR SLVR 16FR STAT (SET/KITS/TRAYS/PACK) ×2 IMPLANT
UNDERPAD 30X36 HEAVY ABSORB (UNDERPADS AND DIAPERS) ×2 IMPLANT
WATER STERILE IRR 1000ML POUR (IV SOLUTION) ×2 IMPLANT
YANKAUER SUCT BULB TIP 10FT TU (MISCELLANEOUS) IMPLANT

## 2018-10-10 NOTE — Transfer of Care (Signed)
Immediate Anesthesia Transfer of Care Note  Patient: Kim Ortega  Procedure(s) Performed: XI ROBOTIC ASSISTED SALPINGO OOPHORECTOMY (Bilateral )  Patient Location: PACU  Anesthesia Type:General  Level of Consciousness: drowsy and patient cooperative  Airway & Oxygen Therapy: Patient Spontanous Breathing and Patient connected to face mask oxygen  Post-op Assessment: Report given to RN and Post -op Vital signs reviewed and stable  Post vital signs: Reviewed and stable  Last Vitals:  Vitals Value Taken Time  BP 152/84 10/10/18 0949  Temp    Pulse 77 10/10/18 0949  Resp 20 10/10/18 0949  SpO2 100 % 10/10/18 0949  Vitals shown include unvalidated device data.  Last Pain:  Vitals:   10/10/18 0641  TempSrc:   PainSc: 0-No pain         Complications: No apparent anesthesia complications

## 2018-10-10 NOTE — Anesthesia Postprocedure Evaluation (Signed)
Anesthesia Post Note  Patient: Kim Ortega  Procedure(s) Performed: XI ROBOTIC ASSISTED SALPINGO OOPHORECTOMY (Bilateral )     Patient location during evaluation: PACU Anesthesia Type: General Level of consciousness: awake and alert Pain management: pain level controlled Vital Signs Assessment: post-procedure vital signs reviewed and stable Respiratory status: spontaneous breathing, nonlabored ventilation and respiratory function stable Cardiovascular status: blood pressure returned to baseline and stable Postop Assessment: no apparent nausea or vomiting Anesthetic complications: no    Last Vitals:  Vitals:   10/10/18 1030 10/10/18 1143  BP: (!) 151/92 (!) 174/99  Pulse: 72 72  Resp: 13 16  Temp: (!) 36.3 C   SpO2: 98% 100%    Last Pain:  Vitals:   10/10/18 1143  TempSrc:   PainSc: 3                  Lidia Collum

## 2018-10-10 NOTE — Discharge Instructions (Signed)
10/10/2018  Return to work: 4 wee[employer]  Activity: 1. Be up and out of the bed during the day.  Take a nap if needed.  You may walk up steps but be careful and use the hand rail.  Stair climbing will tire you more than you think, you may need to stop part way and rest.   2. No lifting or straining for 4 weeks.  3. No driving for 1 weeks.  Do Not drive if you are taking narcotic pain medicine.  4. Shower daily.  Use soap and water on your incision and pat dry; don't rub.   5. No sexual activity and nothing in the vagina for 1 weeks.  Medications:  - Take ibuprofen and tylenol first line for pain control. Take these regularly (every 6 hours) to decrease the build up of pain.  - If necessary, for severe pain not relieved by ibuprofen, take oxycodone.  - While taking oxycodone you should take sennakot every night to reduce the likelihood of constipation. If this causes diarrhea, stop its use.  Diet: 1. Low sodium Heart Healthy Diet is recommended.  2. It is safe to use a laxative if you have difficulty moving your bowels.   Wound Care: 1. Keep clean and dry.  Shower daily.  Reasons to call the Doctor:   Fever - Oral temperature greater than 100.4 degrees Fahrenheit  Foul-smelling vaginal discharge  Difficulty urinating  Nausea and vomiting  Increased pain at the site of the incision that is unrelieved with pain medicine.  Difficulty breathing with or without chest pain  New calf pain especially if only on one side  Sudden, continuing increased vaginal bleeding with or without clots.   Follow-up: 1. See Everitt Amber in 3 weeks.  Contacts: For questions or concerns you should contact:  Dr. Everitt Amber at [phone] After hours and on week-ends call [phone] and ask to speak to the physician on call for Gynecologic Oncology

## 2018-10-10 NOTE — Interval H&P Note (Signed)
History and Physical Interval Note:  10/10/2018 7:03 AM  Kim Ortega  has presented today for surgery, with the diagnosis of ovarian cyst.  The various methods of treatment have been discussed with the patient and family. After consideration of risks, benefits and other options for treatment, the patient has consented to  Procedure(s): XI ROBOTIC ASSISTED SALPINGO OOPHORECTOMY (Bilateral) as a surgical intervention.  The patient's history has been reviewed, patient examined, no change in status, stable for surgery.  I have reviewed the patient's chart and labs.  Questions were answered to the patient's satisfaction.     Thereasa Solo

## 2018-10-10 NOTE — Op Note (Signed)
OPERATIVE NOTE  Date: 10/10/18  Preoperative Diagnosis: left ovarian cyst   Postoperative Diagnosis:  same  Procedure(s) Performed: Robotic-assisted laparoscopic bilateral salpingo-oophorectomy  Surgeon: Everitt Amber, M.D.  Assistant Surgeon: Jeral Pinch M.D. (an MD assistant was necessary for tissue manipulation, management of robotic instrumentation, retraction and positioning due to the complexity of the case and hospital policies).   Anesthesia: Gen. endotracheal.  Specimens: Bilateral ovaries, fallopian tubes, pelvic washings  Estimated Blood Loss: <10 mL. Blood Replacement: None  Complications: none  Indication for Procedure:  Symptomatic 11cm left ovarian cyst, normal CA 125.  Operative Findings: 11cm left ovarian cyst, omental adhesions to anterior abdominal wall and sigmoid adhesions to cyst. Normal right tube and ovary, normal upper abdomen, no ascites.  F[place]zen pathology was consistent with benign left ovarian cyst  Procedure: The patient's taken to the operating room and placed under general endotracheal anesthesia testing difficulty. She is placed in a dorsolithotomy position and cervical acromial pad was placed. The arms were tucked with care taken to pad the olecranon process. And prepped and draped in usual sterile fashion.  A 58mm incision was made in the left upper quadrant palmer's point and a 5 mm Optiview trocar used to enter the abdomen under direct visualization. With entry into the abdomen and then maintenance of 15 mm of mercury the patient was placed in Trendelenburg position. An incision was made in the umbilicus and a [id number] trochar was placed through this site. Two incisions were made lateral to the umbilical incision in the left and right abdomen measuring 33mm. These incisions were made approximately 10 cm lateral to the umbilical incision. 8 mm robotic trochars were inserted. The robot was docked.  The abdomen was inspected as was the pelvis.  Pelvic  washings were obtained. An incision was made on the right pelvic side wall peritoneum parallel to the IP ligament and the retroperitoneal space entered. The right ureter was identified and the para-rectal space was developed. A window was created in the right broad ligament above the ureter. The right infundibulopelvic vessels were skeletonized cauterized and transected. The vaginal attachments to the ovary similarly were cauterized and transected. Specimen was placed in an Endo Catch bag.  In a similar manner the left peritoneum and the side wall was incised, and the retroperitoneal space entered. The left ureter was identified and the left pararectal space was developed. The left IP ligament was skeletonized cauterized and transected. The vaginal attachments to the ovary were cauterized and transected in the left adnexa was placed in an Endo Catch bag.  The abdomen was copiously irrigated and drained and all operative sites inspected and hemostasis was assured  The robot was undocked. The contents of the left Endo Catch bag were first aspirated and then morcellated to facilitate removal from the abdominal cavity through the LUQ incision. In a similar fashion the contents of the right Endo Catch bag or morcellated to facilitate removal from the abdominal cavity. Morcellation was contained within the bags.   The ports were all remove. The fascial closure at the umbilical incision and left upper quadrant port was made with 0 Vicryl.  All incisions were closed with a running subcuticular Monocryl suture. Dermabond was applied. Sponge, lap and needle counts were correct x 3.    The patient had sequential compression devices for VTE prophylaxis.         Disposition: PACU          Condition: stable  Donaciano Eva, MD

## 2018-10-10 NOTE — Anesthesia Procedure Notes (Signed)
Procedure Name: Intubation Date/Time: 10/10/2018 8:10 AM Performed by: Montel Clock, CRNA Pre-anesthesia Checklist: Patient identified, Emergency Drugs available, Suction available, Patient being monitored and Timeout performed Patient Re-evaluated:Patient Re-evaluated prior to induction Oxygen Delivery Method: Circle system utilized Preoxygenation: Pre-oxygenation with 100% oxygen Induction Type: IV induction Ventilation: Mask ventilation without difficulty Laryngoscope Size: Mac and 3 Grade View: Grade II Tube type: Oral Tube size: 7.0 mm Number of attempts: 1 Airway Equipment and Method: Stylet Placement Confirmation: ETT inserted through vocal cords under direct vision,  positive ETCO2 and breath sounds checked- equal and bilateral Secured at: 21 cm Tube secured with: Tape Dental Injury: Teeth and Oropharynx as per pre-operative assessment

## 2018-10-11 ENCOUNTER — Telehealth: Payer: Self-pay

## 2018-10-11 ENCOUNTER — Encounter (HOSPITAL_COMMUNITY): Payer: Self-pay | Admitting: Gynecologic Oncology

## 2018-10-11 LAB — CYTOLOGY - NON PAP

## 2018-10-11 LAB — SURGICAL PATHOLOGY

## 2018-10-11 NOTE — Telephone Encounter (Signed)
Ms Kim Ortega is doing well.  Eating, drinking, and urinating well. Passing gas. Incisions are D&I. Pt aware of office phone number and follow up appointments.  Told her that her final surgical pathology was benign per Joylene John, NP.

## 2018-10-14 ENCOUNTER — Other Ambulatory Visit: Payer: Self-pay | Admitting: Gynecologic Oncology

## 2018-10-14 DIAGNOSIS — R1900 Intra-abdominal and pelvic swelling, mass and lump, unspecified site: Secondary | ICD-10-CM

## 2018-11-08 ENCOUNTER — Inpatient Hospital Stay: Payer: BC Managed Care – PPO | Attending: Gynecologic Oncology | Admitting: Gynecologic Oncology

## 2018-11-08 ENCOUNTER — Other Ambulatory Visit: Payer: Self-pay

## 2018-11-08 ENCOUNTER — Encounter: Payer: Self-pay | Admitting: Gynecologic Oncology

## 2018-11-08 VITALS — BP 134/81 | HR 78 | Temp 98.0°F | Resp 17 | Ht 64.0 in | Wt 112.6 lb

## 2018-11-08 DIAGNOSIS — N83202 Unspecified ovarian cyst, left side: Secondary | ICD-10-CM | POA: Insufficient documentation

## 2018-11-08 DIAGNOSIS — Z90722 Acquired absence of ovaries, bilateral: Secondary | ICD-10-CM | POA: Diagnosis not present

## 2018-11-08 NOTE — Progress Notes (Signed)
Follow-up Note: Gyn-Onc  Consult was requested by Dr. Nori Riis for the evaluation of Kim Ortega 63 y.o. female  CC:  Chief Complaint  Patient presents with  . Cyst of left ovary    Assessment/Plan:  Kim Ortega  is a 63 y.o.  year old s/p robotic assisted BSO on 10/10/18.  Doing well postop. Cleared for all activity.   HPI: Kim Ortega is a 63 year old P0 who is seen in consultation at the request of Dr Nori Riis for evaluation of a left ovarian cystic mass.  The patient was receiving a routine pelvic examination with Dr. Nori Riis on September 20, 2018.  At that time pelvic fullness was appreciated and a transvaginal ultrasound was obtained the same day.  This revealed that she was status post hysterectomy, with a midline appearing simple cysts 2 of them in size, 1 of them 4.5 x 2 cm the other 8.2 x 6.1 cm.  No blood flow within the cyst.  She underwent a CT scan of the abdomen and pelvis on September 20, 2018 and this revealed a large complex cystic mass in the left adnexa.  It measured 6.4 x 8.4 cm and had 1 thin internal septation and eccentric soft tissue density along its medial border.  It appeared to be arising from the left adnexa.  The patient has a surgical history that is significant for multiple laparotomies via low transverse incision including a myomectomy, hysterectomy 1988 for fibroid uterus, history of ovarian cystectomies prior to those surgeries.  She has a history of vascular disease with known carotid plaques, she has hypertension.  She takes aspirin 81 mg daily.  She is never been pregnant.  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  She is receiving letrozole for this.  She has symptomatic hot flashes with this but they are not overly bothersome to the patient.  She sees Dr. Lindi Adie for follow-up in the cancer center.  Interval Hx:  On 10/10/18 she underwent a robotic assisted BSO.  Final  pathology revealed a left ovarian benign serous cyst and a benign right tube and ovary.  She healed well from surgery with no associated complications.  Current Meds:  Outpatient Encounter Medications as of 11/08/2018  Medication Sig  . ALPRAZolam (XANAX) 0.5 MG tablet Take 0.5 mg by mouth 3 (three) times daily as needed for anxiety.  [name] aspirin EC 81 MG tablet Take 81 mg by mouth daily.  [name] atenolol (TENORMIN) 50 MG tablet TAKE 1 TABLET BY MOUTH EVERY DAY (Patient taking differently: Take 50 mg by mouth daily. )  . atorvastatin (LIPITOR) 40 MG tablet Take 1 tablet (40 mg total) by mouth daily.  . Calcium Carbonate-Vitamin D (CALCIUM-D PO) Take 1 tablet by mouth 2 (two) times daily.   . cetirizine (ZYRTEC) 10 MG tablet Take 10 mg by mouth daily.  . clobetasol (OLUX) 0.05 % topical foam Apply 1 application topically daily as needed (psoriasis).   . cyclobenzaprine (FLEXERIL) 10 MG tablet Take 1 tablet (10 mg total) by mouth 3 (three) times daily as needed for muscle spasms.  . fluticasone (FLONASE) 50 MCG/ACT nasal spray SPRAY TWICE INTO EACH NOSTRIL EVERY DAY (Patient taking differently: Place 2 sprays into both nostrils daily. )  . hydrochlorothiazide (HYDRODIURIL) 25 MG tablet TAKE 1 TABLET BY MOUTH EVERY DAY (Patient taking differently: Take 25 mg by mouth daily. )  . ibuprofen (ADVIL) 600 MG tablet Take 1 tablet (600 mg  total) by mouth every 6 (six) hours as needed for moderate pain. For AFTER surgery only  . Ketotifen Fumarate (ALLERGY EYE DROPS OP) Place 1 drop into both eyes 2 (two) times daily as needed (allergies).   [name] letrozole (FEMARA) 2.5 MG tablet Take 1 tablet (2.5 mg total) by mouth daily.  [name] losartan (COZAAR) 100 MG tablet Take 0.5 tablets (50 mg total) by mouth daily.  . Multiple Vitamin (MULTIVITAMIN WITH MINERALS) TABS tablet Take 1 tablet by mouth daily.  . SENNA-PLUS 8.6-50 MG tablet TAKE 2 TABLETS BY MOUTH AT BEDTIME FOR AFTER SURGERY. DO NOT TAKE IF HAVING DIARRHEA  .  [DISCONTINUED] HYDROcodone-acetaminophen (NORCO/VICODIN) 5-325 MG tablet Take 1 tablet by mouth every 6 (six) hours as needed for severe pain. For AFTER surgery only, do not take and drive (Patient not taking: Reported on 11/08/2018)   No facility-administered encounter medications on file as of 11/08/2018.     Allergy: No Known Allergies  Social Hx:   Social History   Socioeconomic History  . Marital status: Married    Spouse name: Not on file  . Number of children: Not on file  . Years of education: Not on file  . Highest education level: Not on file  Occupational History  . Not on file  Social Needs  . Financial resource strain: Not on file  . Food insecurity    Worry: Not on file    Inability: Not on file  . Transportation needs    Medical: Not on file    Non-medical: Not on file  Tobacco Use  . Smoking status: Current Every Day Smoker    Packs/day: 0.50    Years: 20.00    Pack years: 10.00    Types: Cigarettes  . Smokeless tobacco: Never Used  Substance and Sexual Activity  . Alcohol use: Yes    Alcohol/week: 10.0 standard drinks    Types: 10 Glasses of wine per week    Comment: Glasses of wine several times a week  . Drug use: No  . Sexual activity: Yes    Birth control/protection: Surgical    Comment: married  Lifestyle  . Physical activity    Days per week: Not on file    Minutes per session: Not on file  . Stress: Not on file  Relationships  . Social Herbalist on phone: Not on file    Gets together: Not on file    Attends religious service: Not on file    Active member of club or organization: Not on file    Attends meetings of clubs or organizations: Not on file    Relationship status: Not on file  . Intimate partner violence    Fear of current or ex partner: Not on file    Emotionally abused: Not on file    Physically abused: Not on file    Forced sexual activity: Not on file  Other Topics Concern  . Not on file  Social History  Narrative  . Not on file    Past Surgical Hx:  Past Surgical History:  Procedure Laterality Date  . ABDOMINAL HYSTERECTOMY    . BREAST BIOPSY Left 06/2015  . BREAST LUMPECTOMY Left 01/2016  . BREAST LUMPECTOMY WITH RADIOACTIVE SEED AND SENTINEL LYMPH NODE BIOPSY Left 01/08/2016   Procedure: LEFT BREAST LUMPECTOMY WITH RADIOACTIVE SEED AND SENTINEL LYMPH NODE BIOPSY, BLUE DYE INJECTION;  Surgeon: Rolm Bookbinder, MD;  Location: [facility];  Service: General;  Laterality: Left;  .  cervical acdf     C5-6- fusion with plates/screws "Dr. Sherwood Gambler" '12  . COLONOSCOPY WITH PROPOFOL N/A 04/16/2014   Procedure: COLONOSCOPY WITH PROPOFOL;  Surgeon: Garlan Fair, MD;  Location: [facility];  Service: Endoscopy;  Laterality: N/A;  . COLPOSCOPY    . laporotomy     Ovarian cyst  . PORT-A-CATH REMOVAL Right 01/08/2016   Procedure: REMOVAL PORT-A-CATH;  Surgeon: Rolm Bookbinder, MD;  Location: [facility];  Service: General;  Laterality: Right;  . PORTACATH PLACEMENT N/A 07/24/2015   Procedure: INSERTION PORT-A-CATH WITH [place];  Surgeon: Rolm Bookbinder, MD;  Location: [facility];  Service: General;  Laterality: N/A;  . ROBOTIC ASSISTED SALPINGO OOPHERECTOMY Bilateral 10/10/2018   Procedure: XI ROBOTIC ASSISTED SALPINGO OOPHORECTOMY;  Surgeon: Everitt Amber, MD;  Location: [facility];  Service: Gynecology;  Laterality: Bilateral;  . TONSILLECTOMY     child    Past Medical Hx:  Past Medical History:  Diagnosis Date  . Allergy   . Anxiety    mild no regular meds  . Arthritis    psoriatic arthritis -back  . Breast cancer ([place]) 06/25/15 bx   left breastinvasive ductal ca ,grade 1  . Episcleritis   . Hormone replacement therapy   . Hypertension   . Left carotid artery stenosis    40-59% (01/2015)  . Personal history of chemotherapy 2017   neoadjuvant  . Personal history of radiation therapy 2018  . Psoriasis   . Smoker     Past Gynecological History:  See HPI No LMP  recorded. Patient has had a hysterectomy.  Family Hx:  Family History  Problem Relation Age of Onset  . Stroke Mother     Review of Systems:  Constitutional  Feels well,   ENT Normal appearing ears and nares bilaterally Skin/Breast  No rash, sores, jaundice, itching, dryness Cardiovascular  No chest pain, shortness of breath, or edema  Pulmonary  No cough or wheeze.  Gastro Intestinal  No nausea, vomitting, or diarrhoea. No bright red blood per rectum, no abdominal pain, change in bowel movement, or constipation.  Genito Urinary  No frequency, urgency, dysuria, no bleeding Musculo Skeletal  No myalgia, arthralgia, joint swelling or pain  Neurologic  No weakness, numbness, change in gait,  Psychology  No depression, anxiety, insomnia.   Vitals:  Blood pressure 134/81, pulse 78, temperature 98 F (36.7 C), temperature source Temporal, resp. rate 17, height 5\' 4"  (1.626 m), weight 112 lb 9.6 oz (51.1 kg), SpO2 100 %.  Physical Exam: WD in NAD Neck  Supple NROM, without any enlargements.  Lymph Node Survey No cervical supraclavicular or inguinal adenopathy Cardiovascular  Pulse normal rate, regularity and rhythm. S1 and S2 normal.  Lungs  Clear to auscultation bilateraly, without wheezes/crackles/rhonchi. Good air movement.  Skin  No rash/lesions/breakdown  Psychiatry  Alert and oriented to person, place, and time  Abdomen  Normoactive bowel sounds, abdomen soft, non-tender and thin without evidence of hernia. Back No CVA tenderness Genito Urinary  deferred Rectal  deferred Extremities  No bilateral cyanosis, clubbing or edema.   Thereasa Solo, MD  11/08/2018, 5:43 PM

## 2018-11-08 NOTE — Patient Instructions (Signed)
The ovarian cyst was benign. No further follow-up was needed.   Avoid heavy straining until you are 1 month postop.

## 2018-11-09 ENCOUNTER — Telehealth: Payer: Self-pay | Admitting: *Deleted

## 2018-11-09 NOTE — Telephone Encounter (Signed)
Per Dr. Denman George, faxed office note 11/4 to Dr. Nori Riis

## 2019-01-17 ENCOUNTER — Other Ambulatory Visit: Payer: Self-pay | Admitting: Family Medicine

## 2019-03-08 ENCOUNTER — Other Ambulatory Visit: Payer: Self-pay | Admitting: *Deleted

## 2019-03-08 DIAGNOSIS — C50312 Malignant neoplasm of lower-inner quadrant of left female breast: Secondary | ICD-10-CM

## 2019-03-11 NOTE — Progress Notes (Signed)
Patient Care Team: [name], MD as PCP - General (Family Medicine) [name], MD as Consulting Physician (Hematology and Oncology) [name], [name], NP as Nurse Practitioner (Hematology and Oncology) Kyung Rudd, MD as Consulting Physician (Radiation Oncology) Rolm Bookbinder, MD as Consulting Physician (General Surgery)  DIAGNOSIS:    ICD-10-CM   1. Malignant neoplasm of lower-inner quadrant of left breast in female, estrogen receptor positive ([place])  C50.312    Z17.0     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 ([place])  06/25/2015 Initial Diagnosis   Left breast biopsy 8:30 position: IDC grade 1, ER 90%, PR 0%, 50%, HER-2 Negative Ratio 1.21, Irregular Mass Left Breast 1 Cm from Nipple 2.5 x 2 x 2.5 Cm with Distortion, T2 N0 Stage II a Clinical Stage, Oncotype DX score 52, 34% ROR   07/30/2015 - 12/16/2015 Neo-Adjuvant Chemotherapy   Neoadjuvant chemotherapy with dose dense Adriamycin and Cytoxan 4 followed by Abraxane weekly 12   12/18/2015 Breast MRI   Excellent interval response to chemotherapy with only minimal residual enhancement, 2 tiny rounded regions along superior margin, unchanged, no abnormal lymph nodes    01/08/2016 Surgery   Left Lumpectomy [name]): IDC grade 1, 0.2 cm, Margins Neg, 0/1 LN Neg; ypT1aN0   02/10/2016 - 03/23/2016 Radiation Therapy   Adjuvant radiation   04/04/2016 -  Anti-estrogen oral therapy   Anastrozole 1 mg daily switched to letrozole 01/04/2017     CHIEF COMPLIANT: Follow-up of left breast cancer on letrozole therapy  INTERVAL HISTORY: Kim Ortega is a 64 y.o. with above-mentioned history of left breast cancer treated with lumpectomy, radiation, and is currently on letrozole. Mammogram on 8/86/20 showed no evidence of malignancy bilaterally. She presents to the clinic today for follow-up.  Her major complaint today is severe hot flashes that wake  her up all times of the night.  She would like to switch from letrozole to exemestane.  She has retired from her work and appears to be enjoying her retirement.  ALLERGIES:  has No Known Allergies.  MEDICATIONS:  Current Outpatient Medications  Medication Sig Dispense Refill  . ALPRAZolam (XANAX) 0.5 MG tablet Take 0.5 mg by mouth 3 (three) times daily as needed for anxiety.    [name] aspirin EC 81 MG tablet Take 81 mg by mouth daily.    [name] atenolol (TENORMIN) 50 MG tablet Take 1 tablet (50 mg total) by mouth daily. 90 tablet 0  . atorvastatin (LIPITOR) 40 MG tablet Take 1 tablet (40 mg total) by mouth daily. 90 tablet 3  . Calcium Carbonate-Vitamin D (CALCIUM-D PO) Take 1 tablet by mouth 2 (two) times daily.     . cetirizine (ZYRTEC) 10 MG tablet Take 10 mg by mouth daily.    . clobetasol (OLUX) 0.05 % topical foam Apply 1 application topically daily as needed (psoriasis).   2  . cyclobenzaprine (FLEXERIL) 10 MG tablet Take 1 tablet (10 mg total) by mouth 3 (three) times daily as needed for muscle spasms. 30 tablet 2  . exemestane (AROMASIN) 25 MG tablet Take 1 tablet (25 mg total) by mouth daily after breakfast. 90 tablet 3  . fluticasone (FLONASE) 50 MCG/ACT nasal spray SPRAY TWICE INTO EACH NOSTRIL EVERY DAY (Patient taking differently: Place 2 sprays into both nostrils daily. ) 48 mL 3  . hydrochlorothiazide (HYDRODIURIL) 25 MG tablet Take 1 tablet (25 mg total) by mouth daily. 90 tablet 0  . ibuprofen (ADVIL) 600  MG tablet Take 1 tablet (600 mg total) by mouth every 6 (six) hours as needed for moderate pain. For AFTER surgery only 30 tablet 0  . Ketotifen Fumarate (ALLERGY EYE DROPS OP) Place 1 drop into both eyes 2 (two) times daily as needed (allergies).     [name] letrozole (FEMARA) 2.5 MG tablet Take 1 tablet (2.5 mg total) by mouth daily. 90 tablet 3  . losartan (COZAAR) 100 MG tablet TAKE 1/2 TABLET BY MOUTH EVERY DAY 45 tablet 0  . Multiple Vitamin (MULTIVITAMIN WITH MINERALS) TABS tablet  Take 1 tablet by mouth daily.    [name] venlafaxine XR (EFFEXOR XR) 37.5 MG 24 hr capsule Take 1 capsule (37.5 mg total) by mouth daily with breakfast. 30 capsule 3   No current facility-administered medications for this visit.    PHYSICAL EXAMINATION: ECOG PERFORMANCE STATUS: 1 - Symptomatic but completely ambulatory  Vitals:   03/12/19 1405  BP: 126/77  Pulse: 76  Resp: 19  Temp: 97.8 F (36.6 C)  SpO2: 100%   Filed Weights   03/12/19 1405  Weight: 114 lb 12.8 oz (52.1 kg)    BREAST: No palpable masses or nodules in either right or left breasts. No palpable axillary supraclavicular or infraclavicular adenopathy no breast tenderness or nipple discharge. (exam performed in the presence of a chaperone)  LABORATORY DATA:  I have reviewed the data as listed CMP Latest Ref Rng & Units 10/10/2018 10/06/2018 03/09/2018  Glucose 70 - 99 mg/dL 99 95 98  BUN 8 - 23 mg/dL _0 Creatinine 0.44 - 1.00 mg/dL 0.65 0.78 0.81  Sodium 135 - 145 mmol/L 128(L) 130(L) 132(L)  Potassium 3.5 - 5.1 mmol/L 3.0(L) 4.2 3.6  Chloride 98 - 111 mmol/L 90(L) 91(L) 96(L)  CO2 22 - 32 mmol/L 21(L) 27 28  Calcium 8.9 - 10.3 mg/dL 9.4 9.6 9.3  Total Protein 6.5 - 8.1 g/dL - - 7.2  Total Bilirubin 0.3 - 1.2 mg/dL - - 0.3  Alkaline Phos 38 - 126 U/L - - 82  AST 15 - 41 U/L - - 25  ALT 0 - 44 U/L - - 24    Lab Results  Component Value Date   WBC 8.4 03/12/2019   HGB 12.4 03/12/2019   HCT 35.3 (L) 03/12/2019   MCV 96.4 03/12/2019   PLT 251 03/12/2019   NEUTROABS 5.0 03/12/2019    ASSESSMENT & PLAN:  Breast cancer of lower-inner quadrant of left female breast ([facility]) Left breast biopsy 8:30 position: IDC grade 1, ER 90%, PR 0%, 50%, HER-2 Negative Ratio 1.21, Irregular Mass Left Breast 1 Cm from Nipple 2.5 x 2 x 2.5 Cm with Distortion,  T2 N0 Stage II a Clinical Stage Oncotype DX score 52, 34% risk of recurrence without chemotherapy  Treatment Summary: 1. Neoadjuvant chemotherapy with dose dense  Adriamycin and Cytoxan 4 followed by Abraxane weekly 12; started 07/30/2015 completed 12/16/2015  2. 1/4/17Left Lumpectomy: IDC grade 1, 0.2 cm, Margins Neg, 0/1 LN Neg; ypT1aN0 3. Adjuvant radiation therapy 02/10/16- 03/23/16  Plan: Adjuvant anastrozole 1 mg daily 5-10 years switched to letrozole 11/19/2016 due to musculoskeletal aches and pains, switched to exemestane 03/12/2019 because of hot flashes  Letrozole toxicities: 1.  Hot flashes and sweats that keep her up at night: I sent a prescription for Effexor. She would like to switch to exemestane and see if it makes any difference.  I sent a prescription for exemestane.  Breast cancer surveillance: 1.  Breast exam 03/12/2019: Benign 2.  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  She is willing to help with the vaccination) will contact [place] about volunteering for that. Return to clinic in 1 year for follow-up    No orders of the defined types were placed in this encounter.  The patient has a good understanding of the overall plan. she agrees with it. she will call with any problems that may develop before the next visit here.  Total time spent: 20 mins including face to face time and time spent for planning, charting and coordination of care  [name], MD 03/12/2019  I, Cloyde Reams Dorshimer, am acting as scribe for Dr. [name].  I have reviewed the above documentation for accuracy and completeness, and I agree with the above.

## 2019-03-12 ENCOUNTER — Other Ambulatory Visit: Payer: Self-pay

## 2019-03-12 ENCOUNTER — Inpatient Hospital Stay: Payer: BC Managed Care – PPO

## 2019-03-12 ENCOUNTER — Telehealth: Payer: Self-pay | Admitting: Hematology and Oncology

## 2019-03-12 ENCOUNTER — Inpatient Hospital Stay: Payer: BC Managed Care – PPO | Attending: Hematology and Oncology | Admitting: Hematology and Oncology

## 2019-03-12 DIAGNOSIS — Z923 Personal history of irradiation: Secondary | ICD-10-CM | POA: Diagnosis not present

## 2019-03-12 DIAGNOSIS — R61 Generalized hyperhidrosis: Secondary | ICD-10-CM | POA: Insufficient documentation

## 2019-03-12 DIAGNOSIS — Z79899 Other long term (current) drug therapy: Secondary | ICD-10-CM | POA: Insufficient documentation

## 2019-03-12 DIAGNOSIS — C50312 Malignant neoplasm of lower-inner quadrant of left female breast: Secondary | ICD-10-CM | POA: Diagnosis present

## 2019-03-12 DIAGNOSIS — R232 Flushing: Secondary | ICD-10-CM | POA: Diagnosis not present

## 2019-03-12 DIAGNOSIS — Z9221 Personal history of antineoplastic chemotherapy: Secondary | ICD-10-CM | POA: Diagnosis not present

## 2019-03-12 DIAGNOSIS — Z170 Estrogen receptor positive status [ER+]: Secondary | ICD-10-CM

## 2019-03-12 DIAGNOSIS — Z79811 Long term (current) use of aromatase inhibitors: Secondary | ICD-10-CM | POA: Diagnosis not present

## 2019-03-12 LAB — CBC WITH DIFFERENTIAL (CANCER CENTER ONLY)
Abs Immature Granulocytes: 0.02 10*3/uL (ref 0.00–0.07)
Basophils Absolute: 0 10*3/uL (ref 0.0–0.1)
Basophils Relative: 1 %
Eosinophils Absolute: 0.1 10*3/uL (ref 0.0–0.5)
Eosinophils Relative: 1 %
HCT: 35.3 % — ABNORMAL LOW (ref 36.0–46.0)
Hemoglobin: 12.4 g/dL (ref 12.0–15.0)
Immature Granulocytes: 0 %
Lymphocytes Relative: 28 %
Lymphs Abs: 2.3 10*3/uL (ref 0.7–4.0)
MCH: 33.9 pg (ref 26.0–34.0)
MCHC: 35.1 g/dL (ref 30.0–36.0)
MCV: 96.4 fL (ref 80.0–100.0)
Monocytes Absolute: 0.9 10*3/uL (ref 0.1–1.0)
Monocytes Relative: 11 %
Neutro Abs: 5 10*3/uL (ref 1.7–7.7)
Neutrophils Relative %: 59 %
Platelet Count: 251 10*3/uL (ref 150–400)
RBC: 3.66 MIL/uL — ABNORMAL LOW (ref 3.87–5.11)
RDW: 13.1 % (ref 11.5–15.5)
WBC Count: 8.4 10*3/uL (ref 4.0–10.5)
nRBC: 0 % (ref 0.0–0.2)

## 2019-03-12 LAB — CMP (CANCER CENTER ONLY)
ALT: 27 U/L (ref 0–44)
AST: 24 U/L (ref 15–41)
Albumin: 4.2 g/dL (ref 3.5–5.0)
Alkaline Phosphatase: 77 U/L (ref 38–126)
Anion gap: 11 (ref 5–15)
BUN: 16 mg/dL (ref 8–23)
CO2: 29 mmol/L (ref 22–32)
Calcium: 9.3 mg/dL (ref 8.9–10.3)
Chloride: 96 mmol/L — ABNORMAL LOW (ref 98–111)
Creatinine: 0.9 mg/dL (ref 0.44–1.00)
GFR, Est AFR Am: >60 mL/min (ref >60)
GFR, Estimated: >60 mL/min (ref >60)
Glucose, Bld: 96 mg/dL (ref 70–99)
Potassium: 3.4 mmol/L — ABNORMAL LOW (ref 3.5–5.1)
Sodium: 136 mmol/L (ref 135–145)
Total Bilirubin: 0.4 mg/dL (ref 0.3–1.2)
Total Protein: 7.1 g/dL (ref 6.5–8.1)

## 2019-03-12 MED ORDER — EXEMESTANE 25 MG PO TABS: 25.0000 mg | ORAL_TABLET | Freq: Every day | ORAL | 3 refills | Status: DC

## 2019-03-12 MED ORDER — VENLAFAXINE HCL ER 37.5 MG PO CP24: 37.5000 mg | ORAL_CAPSULE | Freq: Every day | ORAL | 3 refills | Status: DC

## 2019-03-12 NOTE — Assessment & Plan Note (Signed)
Left breast biopsy 8:30 position: IDC grade 1, ER 90%, PR 0%, 50%, HER-2 Negative Ratio 1.21, Irregular Mass Left Breast 1 Cm from Nipple 2.5 x 2 x 2.5 Cm with Distortion,  T2 N0 Stage II a Clinical Stage Oncotype DX score 52, 34% risk of recurrence without chemotherapy  Treatment Summary: 1. Neoadjuvant chemotherapy with dose dense Adriamycin and Cytoxan 4 followed by Abraxane weekly 12; started 07/30/2015 completed 12/16/2015  2. 1/4/17Left Lumpectomy: IDC grade 1, 0.2 cm, Margins Neg, 0/1 LN Neg; ypT1aN0 3. Adjuvant radiation therapy 02/10/16- 03/23/16  Plan: Adjuvant anastrozole 1 mg daily 5-10 years switched to letrozole 11/19/2016 due to musculoskeletal aches and pains  Letrozole toxicities: 1.  Hot flashes and sweats that keep her up at night 2.  Muscle aches and pains much better  Breast cancer surveillance: 1.  Breast exam 03/12/2019: Benign 2.  Mammogram: 09/01/2018: No evidence of malignancy, breast density category C CT abdomen and MRI abdomen: Cystic left adnexal lesion (should be followed for stability annually)  Return to clinic in 1 year for follow-up

## 2019-03-12 NOTE — Telephone Encounter (Signed)
I left a message regarding schedule  

## 2019-03-16 ENCOUNTER — Other Ambulatory Visit: Payer: Self-pay | Admitting: Hematology and Oncology

## 2019-04-03 ENCOUNTER — Other Ambulatory Visit: Payer: Self-pay | Admitting: Hematology and Oncology

## 2019-04-05 ENCOUNTER — Other Ambulatory Visit: Payer: Self-pay | Admitting: Family Medicine

## 2019-04-05 ENCOUNTER — Encounter: Payer: Self-pay | Admitting: Family Medicine

## 2019-04-05 MED ORDER — ONDANSETRON HCL 4 MG PO TABS: 4.0000 mg | ORAL_TABLET | Freq: Three times a day (TID) | ORAL | 0 refills | Status: DC | PRN

## 2019-04-13 ENCOUNTER — Other Ambulatory Visit: Payer: Self-pay | Admitting: Family Medicine

## 2019-05-07 ENCOUNTER — Telehealth: Payer: Self-pay

## 2019-05-07 NOTE — Telephone Encounter (Signed)
Pt called to report side effects of diarrhea after recent medication changes.   Pt was changed to Effexor 37.5mg  and Exemestane 25mg .  Pt has been experiencing diarrhea since new medications.  Pt has been able to manage diarrhea with increasing fluid intake at home, however is concerned body isn't adjusting to medications.   Per MD recommendations pt to hold medications X 2 weeks, RN educated pt on tapering of Effexor.  Pt will call to report to clinic in two weeks, at this time MD to determine if we will switch her back to Letrozole vs continue Exemestane without Effexor.   Pt verbalized understanding and agreement.

## 2019-05-21 ENCOUNTER — Telehealth: Payer: Self-pay | Admitting: *Deleted

## 2019-05-21 NOTE — Telephone Encounter (Signed)
Received call from pt stating she has stopped Effexor 37.5 mg and Exemestane 25 mg tablet x 2 weeks.  Pt reports no change in diarrhea.  Pt states she continues to experience diarrhea daily with no relief from OTC imodium.  Per MD pt to start taking Effexor and Exemestane and to f/u with PCP for GI workup.  Pt verbalized understanding and appreciative of the advice.

## 2019-06-08 ENCOUNTER — Other Ambulatory Visit: Payer: Self-pay

## 2019-06-08 ENCOUNTER — Encounter: Payer: Self-pay | Admitting: Family Medicine

## 2019-06-08 ENCOUNTER — Ambulatory Visit: Payer: BC Managed Care – PPO | Admitting: Family Medicine

## 2019-06-08 VITALS — BP 120/76 | HR 78 | Temp 95.5°F | Ht 64.0 in | Wt 111.0 lb

## 2019-06-08 DIAGNOSIS — K529 Noninfective gastroenteritis and colitis, unspecified: Secondary | ICD-10-CM | POA: Diagnosis not present

## 2019-06-08 LAB — COMPLETE METABOLIC PANEL WITH GFR
AG Ratio: 1.7 (calc) (ref 1.0–2.5)
ALT: 42 U/L — ABNORMAL HIGH (ref 6–29)
AST: 42 U/L — ABNORMAL HIGH (ref 10–35)
Albumin: 4.3 g/dL (ref 3.6–5.1)
Alkaline phosphatase (APISO): 118 U/L (ref 37–153)
BUN: 10 mg/dL (ref 7–25)
CO2: 31 mmol/L (ref 20–32)
Calcium: 9.5 mg/dL (ref 8.6–10.4)
Chloride: 91 mmol/L — ABNORMAL LOW (ref 98–110)
Creat: 0.7 mg/dL (ref 0.50–0.99)
GFR, Est African American: 106 mL/min/{1.73_m2} (ref >=60)
GFR, Est Non African American: 92 mL/min/{1.73_m2} (ref >=60)
Globulin: 2.5 g/dL (calc) (ref 1.9–3.7)
Glucose, Bld: 88 mg/dL (ref 65–99)
Potassium: 3.7 mmol/L (ref 3.5–5.3)
Sodium: 131 mmol/L — ABNORMAL LOW (ref 135–146)
Total Bilirubin: 0.5 mg/dL (ref 0.2–1.2)
Total Protein: 6.8 g/dL (ref 6.1–8.1)

## 2019-06-08 LAB — CBC WITH DIFFERENTIAL/PLATELET
Absolute Monocytes: 821 cells/uL (ref 200–950)
Basophils Absolute: 38 cells/uL (ref 0–200)
Basophils Relative: 0.5 %
Eosinophils Absolute: 137 cells/uL (ref 15–500)
Eosinophils Relative: 1.8 %
HCT: 36 % (ref 35.0–45.0)
Hemoglobin: 12.5 g/dL (ref 11.7–15.5)
Lymphs Abs: 1573 cells/uL (ref 850–3900)
MCH: 34.2 pg — ABNORMAL HIGH (ref 27.0–33.0)
MCHC: 34.7 g/dL (ref 32.0–36.0)
MCV: 98.4 fL (ref 80.0–100.0)
MPV: 9.2 fL (ref 7.5–12.5)
Monocytes Relative: 10.8 %
Neutro Abs: 5031 cells/uL (ref 1500–7800)
Neutrophils Relative %: 66.2 %
Platelets: 318 10*3/uL (ref 140–400)
RBC: 3.66 10*6/uL — ABNORMAL LOW (ref 3.80–5.10)
RDW: 12.5 % (ref 11.0–15.0)
Total Lymphocyte: 20.7 %
WBC: 7.6 10*3/uL (ref 3.8–10.8)

## 2019-06-08 LAB — TSH: TSH: 0.92 mIU/L (ref 0.40–4.50)

## 2019-06-08 LAB — SEDIMENTATION RATE: Sed Rate: 3 mm/h (ref 0–30)

## 2019-06-08 MED ORDER — CYCLOBENZAPRINE HCL 10 MG PO TABS: 10.0000 mg | ORAL_TABLET | Freq: Three times a day (TID) | ORAL | 2 refills | Status: DC | PRN

## 2019-06-08 MED ORDER — DIPHENOXYLATE-ATROPINE 2.5-0.025 MG PO TABS: 2.0000 | ORAL_TABLET | Freq: Four times a day (QID) | ORAL | 0 refills | Status: DC | PRN

## 2019-06-08 MED ORDER — HYDROCODONE-ACETAMINOPHEN 5-325 MG PO TABS: 1.0000 | ORAL_TABLET | Freq: Four times a day (QID) | ORAL | 0 refills | Status: DC | PRN

## 2019-06-08 NOTE — Progress Notes (Signed)
Subjective:    Patient ID: Kim Ortega, female    DOB: [birth year], 64 y.o.   MRN: [id number]  Patient was admitted to the hospital in October nephrectomy.  She did well postoperatively.  However 2 months ago, the patient began developing diarrhea.  The diarrhea is watery.  It occurs 5 or more times a day.  She denies any blood in her stool.  She denies any abdominal pain although she does have occasional cramps.  She denies any nausea or vomiting.  She has lost 3 pounds since her last visit with oncologist however she states that her weight typically fluctuates between 2 and 3 pounds.  She denies any palpitations or tachycardia.  She denies any travel outside the country.  She denies any known sick contacts.  She denies any recent hospitalizations or antibiotic use.  She denies any history of pancreatitis.  She denies any hematochezia or melena.  She has no family history of inflammatory bowel disease.  Past Medical History:  Diagnosis Date  . Allergy   . Anxiety    mild no regular meds  . Arthritis    psoriatic arthritis -back  . Breast cancer ([place]) 06/25/15 bx   left breastinvasive ductal ca ,grade 1  . Episcleritis   . Hormone replacement therapy   . Hypertension   . Left carotid artery stenosis    40-59% (01/2015)  . Personal history of chemotherapy 2017   neoadjuvant  . Personal history of radiation therapy 2018  . Psoriasis   . Smoker    Past Surgical History:  Procedure Laterality Date  . ABDOMINAL HYSTERECTOMY    . BREAST BIOPSY Left 06/2015  . BREAST LUMPECTOMY Left 01/2016  . BREAST LUMPECTOMY WITH RADIOACTIVE SEED AND SENTINEL LYMPH NODE BIOPSY Left 01/08/2016   Procedure: LEFT BREAST LUMPECTOMY WITH RADIOACTIVE SEED AND SENTINEL LYMPH NODE BIOPSY, BLUE DYE INJECTION;  Surgeon: Rolm Bookbinder, MD;  Location: [facility];  Service: General;  Laterality: Left;  . cervical acdf     C5-6- fusion with plates/screws "Dr. Sherwood Gambler" '12  . COLONOSCOPY  WITH PROPOFOL N/A 04/16/2014   Procedure: COLONOSCOPY WITH PROPOFOL;  Surgeon: Garlan Fair, MD;  Location: [facility];  Service: Endoscopy;  Laterality: N/A;  . COLPOSCOPY    . laporotomy     Ovarian cyst  . PORT-A-CATH REMOVAL Right 01/08/2016   Procedure: REMOVAL PORT-A-CATH;  Surgeon: Rolm Bookbinder, MD;  Location: [facility];  Service: General;  Laterality: Right;  . PORTACATH PLACEMENT N/A 07/24/2015   Procedure: INSERTION PORT-A-CATH WITH [place];  Surgeon: Rolm Bookbinder, MD;  Location: [facility];  Service: General;  Laterality: N/A;  . ROBOTIC ASSISTED SALPINGO OOPHERECTOMY Bilateral 10/10/2018   Procedure: XI ROBOTIC ASSISTED SALPINGO OOPHORECTOMY;  Surgeon: Everitt Amber, MD;  Location: [facility];  Service: Gynecology;  Laterality: Bilateral;  . TONSILLECTOMY     child   No Known Allergies Current Outpatient Medications on File Prior to Visit  Medication Sig Dispense Refill  . ALPRAZolam (XANAX) 0.5 MG tablet Take 0.5 mg by mouth 3 (three) times daily as needed for anxiety.    [name] aspirin EC 81 MG tablet Take 81 mg by mouth daily.    [name] atenolol (TENORMIN) 50 MG tablet TAKE 1 TABLET BY MOUTH EVERY DAY 90 tablet 0  . atorvastatin (LIPITOR) 40 MG tablet Take 1 tablet (40 mg total) by mouth daily. 90 tablet 3  . Calcium Carbonate-Vitamin D (CALCIUM-D PO) Take 1 tablet by mouth 2 (two) times daily.     [name]  cetirizine (ZYRTEC) 10 MG tablet Take 10 mg by mouth daily.    . clobetasol (OLUX) 0.05 % topical foam Apply 1 application topically daily as needed (psoriasis).   2  . exemestane (AROMASIN) 25 MG tablet Take 1 tablet (25 mg total) by mouth daily after breakfast. 90 tablet 3  . fluticasone (FLONASE) 50 MCG/ACT nasal spray SPRAY TWICE INTO EACH NOSTRIL EVERY DAY (Patient taking differently: Place 2 sprays into both nostrils daily. ) 48 mL 3  . hydrochlorothiazide (HYDRODIURIL) 25 MG tablet TAKE 1 TABLET BY MOUTH EVERY DAY 90 tablet 0  . ibuprofen (ADVIL) 600 MG tablet Take 1  tablet (600 mg total) by mouth every 6 (six) hours as needed for moderate pain. For AFTER surgery only 30 tablet 0  . Ketotifen Fumarate (ALLERGY EYE DROPS OP) Place 1 drop into both eyes 2 (two) times daily as needed (allergies).     . losartan (COZAAR) 50 MG tablet TAKE 1 TABLET EVERY DAY 90 tablet 1  . Multiple Vitamin (MULTIVITAMIN WITH MINERALS) TABS tablet Take 1 tablet by mouth daily.    [name] venlafaxine XR (EFFEXOR-XR) 37.5 MG 24 hr capsule TAKE 1 CAPSULE BY MOUTH DAILY WITH BREAKFAST. 90 capsule 2  . cyclobenzaprine (FLEXERIL) 10 MG tablet Take 1 tablet (10 mg total) by mouth 3 (three) times daily as needed for muscle spasms. (Patient not taking: Reported on 06/08/2019) 30 tablet 2  . ondansetron (ZOFRAN) 4 MG tablet Take 1 tablet (4 mg total) by mouth every 8 (eight) hours as needed for nausea or vomiting. 20 tablet 0   No current facility-administered medications on file prior to visit.   Social History   Socioeconomic History  . Marital status: Married    Spouse name: Not on file  . Number of children: Not on file  . Years of education: Not on file  . Highest education level: Not on file  Occupational History  . Not on file  Tobacco Use  . Smoking status: Current Every Day Smoker    Packs/day: 0.50    Years: 20.00    Pack years: 10.00    Types: Cigarettes  . Smokeless tobacco: Never Used  Substance and Sexual Activity  . Alcohol use: Yes    Alcohol/week: 10.0 standard drinks    Types: 10 Glasses of wine per week    Comment: Glasses of wine several times a week  . Drug use: No  . Sexual activity: Yes    Birth control/protection: Surgical    Comment: married  Other Topics Concern  . Not on file  Social History Narrative  . Not on file   Social Determinants of Health   Financial Resource Strain:   . Difficulty of Paying Living Expenses:   Food Insecurity:   . Worried About Charity fundraiser in the Last Year:   . Arboriculturist in the Last Year:   Transportation  Needs:   . Film/video editor (Medical):   [name] Lack of Transportation (Non-Medical):   Physical Activity:   . Days of Exercise per Week:   . Minutes of Exercise per Session:   Stress:   . Feeling of Stress :   Social Connections:   . Frequency of Communication with Friends and Family:   . Frequency of Social Gatherings with Friends and Family:   . Attends Religious Services:   . Active Member of Clubs or Organizations:   . Attends Archivist Meetings:   [name] Marital Status:   Intimate  Partner Violence:   . Fear of Current or Ex-Partner:   . Emotionally Abused:   [name] Physically Abused:   . Sexually Abused:      Review of Systems  Gastrointestinal: Positive for diarrhea.  Musculoskeletal: Positive for back pain.  All other systems reviewed and are negative.      Objective:   Physical Exam Vitals reviewed.  Constitutional:      General: She is not in acute distress.    Appearance: She is well-developed. She is not diaphoretic.  HENT:     Right Ear: Tympanic membrane and ear canal normal.     Left Ear: Tympanic membrane and ear canal normal.     Nose: No mucosal edema or rhinorrhea.     Right Sinus: No maxillary sinus tenderness.     Left Sinus: No maxillary sinus tenderness.     Mouth/Throat:     Pharynx: No oropharyngeal exudate.  Cardiovascular:     Rate and Rhythm: Normal rate and regular rhythm.     Heart sounds: Normal heart sounds. No murmur.  Pulmonary:     Effort: Pulmonary effort is normal. No respiratory distress.     Breath sounds: Normal breath sounds. No wheezing or rales.  Abdominal:     General: Abdomen is flat. Bowel sounds are normal. There is no distension.     Palpations: Abdomen is soft.     Tenderness: There is no abdominal tenderness. There is no guarding.  Musculoskeletal:     Lumbar back: Spasms, tenderness and bony tenderness present. Decreased range of motion.  Neurological:     Mental Status: She is alert and oriented to person,  place, and time.     Cranial Nerves: No cranial nerve deficit.     Motor: No abnormal muscle tone.     Coordination: Coordination normal.           Assessment & Plan:  Chronic diarrhea - Plan: COMPLETE METABOLIC PANEL WITH GFR, CBC with Differential/Platelet, TSH, Sedimentation rate, Stool culture, C. difficile GDH and Toxin A/B, Fecal fat, qualitative  Patient also reports a recurrence of her low back pain.  At times she has difficult time sleeping at night due to muscle spasms in the lower back.  She also has occasional left-sided sciatica.  She is requesting a refill on her hydrocodone which she uses sparingly just for breakthrough pain.  She would also like a prescription of Flexeril which she can use at night to help her sleep and help alleviate some of her muscle spasms in her back.  Obtain a CBC and a CMP along with a sedimentation rate.  Check a TSH to rule out hypothyroidism.  Check stool cultures.  Rule out C. difficile by checking C. difficile toxin.  Check fecal fat to evaluate for possible malabsorptive diarrhea conditions such as pancreatic insufficiency.  If lab work is normal and stool test are normal, I am concerned about possible inflammatory bowel disease I would recommend a GI consultation for colonoscopy.

## 2019-06-10 ENCOUNTER — Other Ambulatory Visit: Payer: Self-pay | Admitting: Hematology and Oncology

## 2019-06-11 ENCOUNTER — Encounter: Payer: Self-pay | Admitting: Nurse Practitioner

## 2019-06-11 ENCOUNTER — Other Ambulatory Visit: Payer: BC Managed Care – PPO

## 2019-06-11 ENCOUNTER — Other Ambulatory Visit: Payer: Self-pay

## 2019-06-12 LAB — C. DIFFICILE GDH AND TOXIN A/B
GDH ANTIGEN: NOT DETECTED
MICRO NUMBER:: 10560348
SPECIMEN QUALITY:: ADEQUATE
TOXIN A AND B: NOT DETECTED

## 2019-06-15 LAB — STOOL CULTURE
MICRO NUMBER:: 10560380
MICRO NUMBER:: 10560381
MICRO NUMBER:: 10560382
SHIGA RESULT:: NOT DETECTED
SPECIMEN QUALITY:: ADEQUATE
SPECIMEN QUALITY:: ADEQUATE
SPECIMEN QUALITY:: ADEQUATE

## 2019-06-16 LAB — FECAL FAT, QUALITATIVE: FECAL FAT, QUALITATIVE: NORMAL

## 2019-06-18 ENCOUNTER — Other Ambulatory Visit: Payer: Self-pay | Admitting: Family Medicine

## 2019-06-18 ENCOUNTER — Encounter: Payer: Self-pay | Admitting: Family Medicine

## 2019-06-18 MED ORDER — DIPHENOXYLATE-ATROPINE 2.5-0.025 MG PO TABS: 2.0000 | ORAL_TABLET | Freq: Four times a day (QID) | ORAL | 0 refills | Status: DC | PRN

## 2019-06-18 NOTE — Telephone Encounter (Signed)
Ok to refill 

## 2019-06-19 ENCOUNTER — Other Ambulatory Visit: Payer: Self-pay | Admitting: Family Medicine

## 2019-06-19 MED ORDER — CHOLESTYRAMINE 4 G PO PACK
4.0000 g | PACK | Freq: Two times a day (BID) | ORAL | 12 refills | Status: DC
Start: 2019-06-19 — End: 2019-07-05

## 2019-07-05 ENCOUNTER — Encounter: Payer: Self-pay | Admitting: Nurse Practitioner

## 2019-07-05 ENCOUNTER — Ambulatory Visit: Payer: BC Managed Care – PPO | Admitting: Nurse Practitioner

## 2019-07-05 VITALS — BP 132/74 | HR 66 | Ht 64.0 in | Wt 107.0 lb

## 2019-07-05 DIAGNOSIS — R7989 Other specified abnormal findings of blood chemistry: Secondary | ICD-10-CM | POA: Diagnosis not present

## 2019-07-05 DIAGNOSIS — Z853 Personal history of malignant neoplasm of breast: Secondary | ICD-10-CM | POA: Diagnosis not present

## 2019-07-05 DIAGNOSIS — K580 Irritable bowel syndrome with diarrhea: Secondary | ICD-10-CM | POA: Diagnosis not present

## 2019-07-05 NOTE — Patient Instructions (Signed)
If you are age 64 or older, your body mass index should be between 23-30. Your Body mass index is 18.37 kg/m. If this is out of the aforementioned range listed, please consider follow up with your Primary Care Provider.  If you are age 69 or younger, your body mass index should be between 19-25. Your Body mass index is 18.37 kg/m. If this is out of the aformentioned range listed, please consider follow up with your Primary Care Provider.   1. We have requested copies from your most recent Colonoscopy at [facility]. 2. Call our office if diarrhea recurs 3. Colonoscopy recall to be determined after we receive records from [facility] 4. LFTs to be repeated by your Primary Care Physician  Due to recent changes in healthcare laws, you may see the results of your imaging and laboratory studies on MyChart before your provider has had a chance to review them.  We understand that in some cases there may be results that are confusing or concerning to you. Not all laboratory results come back in the same time frame and the provider may be waiting for multiple results in order to interpret others.  Please give [place] 48 hours in order for your provider to thoroughly review all the results before contacting the office for clarification of your results.   Thank you for choosing [facility] Noralyn Pick, CRNP

## 2019-07-05 NOTE — Progress Notes (Addendum)
07/05/2019 [name] [id number] [birth year]   CHIEF COMPLAINT: diarrhea   HISTORY OF PRESENT ILLNESS:  Kim Ortega is a 64 year old female with a past medical history of anxiety, hypertension,  estrogen positive breast cancer 2017 chemo x 6 months s/p lumpectomy and sentinel lymph node biopsy 01/2016 then radiation x 6 weeks. She resents today as referred by her PCP Dr. Gwynneth Munson for further evaluation regarding diarrhea which started in April 2021.  She and multiple family members developed nausea/vomiting and diarrhea illness which lasted approximately 3 days April 2021.  Since that time, she continued to have nonbloody brown watery diarrhea with flecks of stool up to 8 times daily.  She had some lower abdominal cramping without any significant abdominal pain.  She initially took Pepto-Bismol without significant improvement.  She noted her stools looked black and tarry during the time she took Pepto-Bismol.  No further black stools since she stopped taking Pepto.  She was seen by her primary care physician and stool cultures including C. difficile were negative.  She was prescribed Lomotil 2 tabs p.o. 4 times daily which she took for a few weeks.  She lost 8 pounds over the past 6 weeks.  She was having heartburn once weekly over the past 6 weeks which has abated.  Her diarrhea abated 3 weeks ago.  She is now passing a soft formed brown bowel movement once daily, sometimes she skips a day.  No rectal bleeding.  She takes ASA 81mg  daily. Infrequent takes Ibuprofen. She considered canceling today's appointment but she stated her husband encouraged her to keep it.  She underwent 3 colonoscopies by [facility] in her lifetime.  She stated her last colonoscopy was 4 or 5 years ago.  In review of epic, a date for a colonoscopy April 12th, 2016 was documented but I am unable to locate the procedure report.  Our office will contact [facility] for a copy of her procedure report for further review.   No family history of colorectal cancer.  She has night sweats since starting Aromasin for her breast cancer.  Laboratory studies done by her Dr. Vic Blackbird  06/08/2019 showed a low sodium 131 with elevated AST 42 and ALT 42. Repeat CMET was ordered to be done in 1 week which she has not yet done.  No history of liver disease. She drinks 2 glasses of wine 4 days weekly. She is on Lipitor for hypercholesterolemia. No other complaints today.  CBC Latest Ref Rng & Units 06/08/2019 03/12/2019 10/06/2018  WBC 3.8 - 10.8 Thousand/uL 7.6 8.4 10.6(H)  Hemoglobin 11.7 - 15.5 g/dL 12.5 12.4 11.6(L)  Hematocrit 35 - 45 % 36.0 35.3(L) 33.9(L)  Platelets 140 - 400 Thousand/uL 318 251 343    CMP Latest Ref Rng & Units 06/08/2019 03/12/2019 10/10/2018  Glucose 65 - 99 mg/dL 88 96 99  BUN 7 - 25 mg/dL 10 16 8   Creatinine 0.50 - 0.99 mg/dL 0.70 0.90 0.65  Sodium 135 - 146 mmol/L 131(L) 136 128(L)  Potassium 3.5 - 5.3 mmol/L 3.7 3.4(L) 3.0(L)  Chloride 98 - 110 mmol/L 91(L) 96(L) 90(L)  CO2 20 - 32 mmol/L 31 29 21(L)  Calcium 8.6 - 10.4 mg/dL 9.5 9.3 9.4  Total Protein 6.1 - 8.1 g/dL 6.8 7.1 -  Total Bilirubin 0.2 - 1.2 mg/dL 0.5 0.4 -  Alkaline Phos 38 - 126 U/L - 77 -  AST 10 - 35 U/L 42(H) 24 -  ALT 6 -  29 U/L 42(H) 27 -    Past Medical History:  Diagnosis Date  . Allergy   . Anxiety    mild no regular meds  . Arthritis    psoriatic arthritis -back  . Breast cancer ([place]) 06/25/15 bx   left breastinvasive ductal ca ,grade 1  . Episcleritis   . Hormone replacement therapy   . Hypertension   . Left carotid artery stenosis    40-59% (01/2015)  . Personal history of chemotherapy 2017   neoadjuvant  . Personal history of radiation therapy 2018  . Psoriasis   . Smoker    Past Surgical History:  Procedure Laterality Date  . ABDOMINAL HYSTERECTOMY    . BREAST BIOPSY Left 06/2015  . BREAST LUMPECTOMY Left 01/2016  . BREAST LUMPECTOMY WITH RADIOACTIVE SEED AND SENTINEL LYMPH NODE BIOPSY Left 01/08/2016     Procedure: LEFT BREAST LUMPECTOMY WITH RADIOACTIVE SEED AND SENTINEL LYMPH NODE BIOPSY, BLUE DYE INJECTION;  Surgeon: Rolm Bookbinder, MD;  Location: [place];  Service: General;  Laterality: Left;  . cervical acdf     C5-6- fusion with plates/screws "Dr. Sherwood Gambler" '12  . COLONOSCOPY WITH PROPOFOL N/A 04/16/2014   Procedure: COLONOSCOPY WITH PROPOFOL;  Surgeon: Garlan Fair, MD;  Location: [facility];  Service: Endoscopy;  Laterality: N/A;  . COLPOSCOPY    . laporotomy     Ovarian cyst  . PORT-A-CATH REMOVAL Right 01/08/2016   Procedure: REMOVAL PORT-A-CATH;  Surgeon: Rolm Bookbinder, MD;  Location: [place];  Service: General;  Laterality: Right;  . PORTACATH PLACEMENT N/A 07/24/2015   Procedure: INSERTION PORT-A-CATH WITH [place];  Surgeon: Rolm Bookbinder, MD;  Location: [place];  Service: General;  Laterality: N/A;  . ROBOTIC ASSISTED SALPINGO OOPHERECTOMY Bilateral 10/10/2018   Procedure: XI ROBOTIC ASSISTED SALPINGO OOPHORECTOMY;  Surgeon: Everitt Amber, MD;  Location: [facility];  Service: Gynecology;  Laterality: Bilateral;  . TONSILLECTOMY     child    Family History: Mother died 52 CVA. Father died 16 failure to thrive. Maternal and paternal grandfathers and grandmothers had heart diseae.   Social History: She is a retired Therapist, sports, Librarian, academic and Higher education careers adviser. She smokes cigarettes < 1ppd x 30 years on and off. She drinks 2 glasses of wine 4 days weekly. Marijuana use in college and during chemotherapy.   No Known Allergies    Outpatient Encounter Medications as of 07/05/2019  Medication Sig  . ALPRAZolam (XANAX) 0.5 MG tablet Take 0.5 mg by mouth 3 (three) times daily as needed for anxiety.  [name] aspirin EC 81 MG tablet Take 81 mg by mouth daily.  [name] atenolol (TENORMIN) 50 MG tablet TAKE 1 TABLET BY MOUTH EVERY DAY  . atorvastatin (LIPITOR) 40 MG tablet Take 1 tablet (40 mg total) by mouth daily.  . Calcium Carbonate-Vitamin D (CALCIUM-D PO)  Take 1 tablet by mouth 2 (two) times daily.   . cetirizine (ZYRTEC) 10 MG tablet Take 10 mg by mouth daily.  . clobetasol (OLUX) 0.05 % topical foam Apply 1 application topically daily as needed (psoriasis).   . cyclobenzaprine (FLEXERIL) 10 MG tablet Take 1 tablet (10 mg total) by mouth 3 (three) times daily as needed for muscle spasms.  [name] exemestane (AROMASIN) 25 MG tablet Take 1 tablet (25 mg total) by mouth daily after breakfast.  . fluticasone (FLONASE) 50 MCG/ACT nasal spray SPRAY TWICE INTO EACH NOSTRIL EVERY DAY (Patient taking differently: Place 2 sprays into both nostrils daily. )  . hydrochlorothiazide (HYDRODIURIL) 25  MG tablet TAKE 1 TABLET BY MOUTH EVERY DAY  . HYDROcodone-acetaminophen (NORCO) 5-325 MG tablet Take 1 tablet by mouth every 6 (six) hours as needed for moderate pain.  [name] ibuprofen (ADVIL) 600 MG tablet Take 1 tablet (600 mg total) by mouth every 6 (six) hours as needed for moderate pain. For AFTER surgery only  . Ketotifen Fumarate (ALLERGY EYE DROPS OP) Place 1 drop into both eyes 2 (two) times daily as needed (allergies).   . losartan (COZAAR) 50 MG tablet TAKE 1 TABLET EVERY DAY  . Multiple Vitamin (MULTIVITAMIN WITH MINERALS) TABS tablet Take 1 tablet by mouth daily.  [name] venlafaxine XR (EFFEXOR-XR) 37.5 MG 24 hr capsule TAKE 1 CAPSULE BY MOUTH DAILY WITH BREAKFAST.  [name] diphenoxylate-atropine (LOMOTIL) 2.5-0.025 MG tablet Take 2 tablets by mouth 4 (four) times daily as needed for diarrhea or loose stools. (Patient not taking: Reported on 07/05/2019)  . [DISCONTINUED] cholestyramine (QUESTRAN) 4 g packet Take 1 packet (4 g total) by mouth in the morning and at bedtime.  . [DISCONTINUED] letrozole ([place]) 2.5 MG tablet TAKE 1 TABLET BY MOUTH EVERY DAY  . [DISCONTINUED] ondansetron (ZOFRAN) 4 MG tablet Take 1 tablet (4 mg total) by mouth every 8 (eight) hours as needed for nausea or vomiting.   No facility-administered encounter medications on file as of 07/05/2019.      REVIEW OF SYSTEMS: All other systems reviewed and negative except where noted in the History of Present Illness.   PHYSICAL EXAM: BP 132/74   Pulse 66   Ht 5\' 4"  (1.626 m)   Wt 107 lb (48.5 kg)   BMI 18.37 kg/m  General: Well developed ... in no acute distress. Head: Normocephalic and atraumatic. Eyes:  Sclerae non-icteric, conjunctive pink. Ears: Normal auditory acuity. Mouth: Dentition intact. No ulcers or lesions.  Neck: Supple, no lymphadenopathy or thyromegaly.  Lungs: Clear bilaterally to auscultation without wheezes, crackles or rhonchi. Heart: Regular rate and rhythm. No murmur, rub or gallop appreciated.  Abdomen: Soft, nontender, non distended. No masses. No hepatosplenomegaly. Normoactive bowel sounds x 4 quadrants.  Rectal:  Musculoskeletal: Symmetrical with no gross deformities. Skin: Warm and dry. No rash or lesions on visible extremities. Extremities: No edema. Neurological: Alert oriented x 4, no focal deficits.  Psychological:  Alert and cooperative. Normal mood and affect.  ASSESSMENT AND PLAN:  56. 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 She passed black loose stool after taking Pepto bismol. No further black stool since she stopped taking Pepto. Stable Hg 12.5. -Patient to call our office if diarrhea recurs -Patient to call our office if black stools recur  -Diet as tolerated   2. Colon cancer screening  -Request copy of colonoscopy from [facility] 04/2014. -Colonoscopy recall date to be verified after the above colonoscopy report reviewed  3. History of breast cancer s/p chemo, left breast lumpectomy and radiation 2017-2018  4. Elevated LFTs -Patient to have repeat CMP as ordered by PCP.  -Follow up in our office for further hepatology evaluation if repeat LFTs remain elevated    ADDENDUM: Records received. Colonoscopy  04/16/2014 by Dr. Earle Gell: Normal screening  colonoscopy. Prep was good. Repeat colonoscopy in 10 years.  CC:  [name], MD

## 2019-07-05 NOTE — Progress Notes (Signed)
Reviewed and agree with documentation and assessment and plan. K. Veena Ajmal Kathan , MD   

## 2019-07-12 ENCOUNTER — Other Ambulatory Visit: Payer: Self-pay

## 2019-07-12 MED ORDER — HYDROCHLOROTHIAZIDE 25 MG PO TABS: 25.0000 mg | ORAL_TABLET | Freq: Every day | ORAL | 2 refills | Status: DC

## 2019-07-13 ENCOUNTER — Other Ambulatory Visit: Payer: BC Managed Care – PPO

## 2019-07-13 ENCOUNTER — Other Ambulatory Visit: Payer: Self-pay

## 2019-07-13 ENCOUNTER — Telehealth: Payer: Self-pay

## 2019-07-13 DIAGNOSIS — E785 Hyperlipidemia, unspecified: Secondary | ICD-10-CM

## 2019-07-13 DIAGNOSIS — R7989 Other specified abnormal findings of blood chemistry: Secondary | ICD-10-CM

## 2019-07-13 DIAGNOSIS — E1169 Type 2 diabetes mellitus with other specified complication: Secondary | ICD-10-CM

## 2019-07-13 NOTE — Telephone Encounter (Signed)
Pt notified. She will come in next week fasting.

## 2019-07-13 NOTE — Telephone Encounter (Signed)
Pt came in this morning for a fasting lipid and a CMP. Pt states that she did not know she had to fast and states that she had some coffee with a little bit of sweetner and creamer and she drank a lot of water.  Will this be fine or does she need to repeat lipids while fasting? Please advise.

## 2019-07-13 NOTE — Telephone Encounter (Signed)
Totally fasting

## 2019-07-14 LAB — COMPLETE METABOLIC PANEL WITH GFR
AG Ratio: 1.9 (calc) (ref 1.0–2.5)
ALT: 15 U/L (ref 6–29)
AST: 20 U/L (ref 10–35)
Albumin: 4.4 g/dL (ref 3.6–5.1)
Alkaline phosphatase (APISO): 72 U/L (ref 37–153)
BUN: 7 mg/dL (ref 7–25)
CO2: 28 mmol/L (ref 20–32)
Calcium: 9.3 mg/dL (ref 8.6–10.4)
Chloride: 86 mmol/L — ABNORMAL LOW (ref 98–110)
Creat: 0.57 mg/dL (ref 0.50–0.99)
GFR, Est African American: 114 mL/min/{1.73_m2} (ref >=60)
GFR, Est Non African American: 98 mL/min/{1.73_m2} (ref >=60)
Globulin: 2.3 g/dL (calc) (ref 1.9–3.7)
Glucose, Bld: 96 mg/dL (ref 65–99)
Potassium: 4 mmol/L (ref 3.5–5.3)
Sodium: 123 mmol/L — ABNORMAL LOW (ref 135–146)
Total Bilirubin: 0.8 mg/dL (ref 0.2–1.2)
Total Protein: 6.7 g/dL (ref 6.1–8.1)

## 2019-07-15 ENCOUNTER — Other Ambulatory Visit: Payer: Self-pay | Admitting: Family Medicine

## 2019-07-17 ENCOUNTER — Other Ambulatory Visit: Payer: BC Managed Care – PPO

## 2019-07-17 ENCOUNTER — Other Ambulatory Visit: Payer: Self-pay

## 2019-07-17 ENCOUNTER — Other Ambulatory Visit: Payer: Self-pay | Admitting: Family Medicine

## 2019-07-17 DIAGNOSIS — E785 Hyperlipidemia, unspecified: Secondary | ICD-10-CM

## 2019-07-18 LAB — LIPID PANEL
Cholesterol: 147 mg/dL (ref <200)
HDL: 86 mg/dL (ref >=50)
LDL Cholesterol (Calc): 48 mg/dL (calc)
Non-HDL Cholesterol (Calc): 61 mg/dL (calc) (ref <130)
Total CHOL/HDL Ratio: 1.7 (calc) (ref <5.0)
Triglycerides: 58 mg/dL (ref <150)

## 2019-07-19 ENCOUNTER — Telehealth: Payer: Self-pay | Admitting: Nurse Practitioner

## 2019-07-19 NOTE — Telephone Encounter (Signed)
Noted  

## 2019-07-20 ENCOUNTER — Other Ambulatory Visit: Payer: Self-pay

## 2019-07-20 MED ORDER — ATENOLOL 50 MG PO TABS: 50.0000 mg | ORAL_TABLET | Freq: Every day | ORAL | 1 refills | Status: DC

## 2019-07-26 ENCOUNTER — Other Ambulatory Visit: Payer: Self-pay

## 2019-07-26 ENCOUNTER — Ambulatory Visit: Payer: BC Managed Care – PPO | Admitting: Family Medicine

## 2019-07-26 VITALS — BP 130/70 | HR 76 | Temp 98.1°F | Ht 64.0 in | Wt 112.0 lb

## 2019-07-26 DIAGNOSIS — E222 Syndrome of inappropriate secretion of antidiuretic hormone: Secondary | ICD-10-CM

## 2019-07-26 LAB — BASIC METABOLIC PANEL WITH GFR
BUN: 12 mg/dL (ref 7–25)
CO2: 29 mmol/L (ref 20–32)
Calcium: 9.7 mg/dL (ref 8.6–10.4)
Chloride: 93 mmol/L — ABNORMAL LOW (ref 98–110)
Creat: 0.69 mg/dL (ref 0.50–0.99)
GFR, Est African American: 107 mL/min/{1.73_m2} (ref >=60)
GFR, Est Non African American: 92 mL/min/{1.73_m2} (ref >=60)
Glucose, Bld: 113 mg/dL — ABNORMAL HIGH (ref 65–99)
Potassium: 4.2 mmol/L (ref 3.5–5.3)
Sodium: 130 mmol/L — ABNORMAL LOW (ref 135–146)

## 2019-07-26 NOTE — Progress Notes (Signed)
Subjective:    Patient ID: Kim Ortega, female    DOB: September 13, 1955, 64 y.o.   MRN: [id number] 06/08/19 Patient was admitted to the hospital in October nephrectomy.  She did well postoperatively.  However 2 months ago, the patient began developing diarrhea.  The diarrhea is watery.  It occurs 5 or more times a day.  She denies any blood in her stool.  She denies any abdominal pain although she does have occasional cramps.  She denies any nausea or vomiting.  She has lost 3 pounds since her last visit with oncologist however she states that her weight typically fluctuates between 2 and 3 pounds.  She denies any palpitations or tachycardia.  She denies any travel outside the country.  She denies any known sick contacts.  She denies any recent hospitalizations or antibiotic use.  She denies any history of pancreatitis.  She denies any hematochezia or melena.  She has no family history of inflammatory bowel disease.  At that time, my plan was: Patient also reports a recurrence of her low back pain.  At times she has difficult time sleeping at night due to muscle spasms in the lower back.  She also has occasional left-sided sciatica.  She is requesting a refill on her hydrocodone which she uses sparingly just for breakthrough pain.  She would also like a prescription of Flexeril which she can use at night to help her sleep and help alleviate some of her muscle spasms in her back.  Obtain a CBC and a CMP along with a sedimentation rate.  Check a TSH to rule out hypothyroidism.  Check stool cultures.  Rule out C. difficile by checking C. difficile toxin.  Check fecal fat to evaluate for possible malabsorptive diarrhea conditions such as pancreatic insufficiency.  If lab work is normal and stool test are normal, I am concerned about possible inflammatory bowel disease I would recommend a GI consultation for colonoscopy.  07/26/19 Patient has seen GI but has yet to have the colonoscopy.  However the diarrhea  has subsided.  While she was having the diarrhea, due to weight loss, she was drinking large quantities of water.  On recent lab work, her sodium level was found to have dropped to 123.  Patient was asymptomatic however I asked her to come in to discuss.  She states that she is cut back on the water for the last 2 days.  The diarrhea has also subsided.  She denies any dizziness or headaches or confusion or seizure-like activity  Past Medical History:  Diagnosis Date  . Allergy   . Anxiety    mild no regular meds  . Arthritis    psoriatic arthritis -back  . Breast cancer ([place]) 06/25/15 bx   left breastinvasive ductal ca ,grade 1  . Episcleritis   . Hormone replacement therapy   . Hypertension   . Left carotid artery stenosis    40-59% (01/2015)  . Personal history of chemotherapy 2017   neoadjuvant  . Personal history of radiation therapy 2018  . Psoriasis   . Smoker    Past Surgical History:  Procedure Laterality Date  . ABDOMINAL HYSTERECTOMY    . BREAST BIOPSY Left 06/2015  . BREAST LUMPECTOMY Left 01/2016  . BREAST LUMPECTOMY WITH RADIOACTIVE SEED AND SENTINEL LYMPH NODE BIOPSY Left 01/08/2016   Procedure: LEFT BREAST LUMPECTOMY WITH RADIOACTIVE SEED AND SENTINEL LYMPH NODE BIOPSY, BLUE DYE INJECTION;  Surgeon: Rolm Bookbinder, MD;  Location: [place];  Service:  General;  Laterality: Left;  . cervical acdf     C5-6- fusion with plates/screws "Dr. Sherwood Gambler" '12  . COLONOSCOPY WITH PROPOFOL N/A 04/16/2014   Procedure: COLONOSCOPY WITH PROPOFOL;  Surgeon: Garlan Fair, MD;  Location: [facility];  Service: Endoscopy;  Laterality: N/A;  . COLPOSCOPY    . laporotomy     Ovarian cyst  . PORT-A-CATH REMOVAL Right 01/08/2016   Procedure: REMOVAL PORT-A-CATH;  Surgeon: Rolm Bookbinder, MD;  Location: [place];  Service: General;  Laterality: Right;  . PORTACATH PLACEMENT N/A 07/24/2015   Procedure: INSERTION PORT-A-CATH WITH [place];  Surgeon: Rolm Bookbinder, MD;  Location: [place];  Service: General;  Laterality: N/A;  . ROBOTIC ASSISTED SALPINGO OOPHERECTOMY Bilateral 10/10/2018   Procedure: XI ROBOTIC ASSISTED SALPINGO OOPHORECTOMY;  Surgeon: Everitt Amber, MD;  Location: [facility];  Service: Gynecology;  Laterality: Bilateral;  . TONSILLECTOMY     child   No Known Allergies Current Outpatient Medications on File Prior to Visit  Medication Sig Dispense Refill  . ALPRAZolam (XANAX) 0.5 MG tablet Take 0.5 mg by mouth 3 (three) times daily as needed for anxiety.    [name] aspirin EC 81 MG tablet Take 81 mg by mouth daily.    [name] atenolol (TENORMIN) 50 MG tablet Take 1 tablet (50 mg total) by mouth daily. 90 tablet 1  . atorvastatin (LIPITOR) 40 MG tablet TAKE 1 TABLET BY MOUTH EVERY DAY 90 tablet 3  . Calcium Carbonate-Vitamin D (CALCIUM-D PO) Take 1 tablet by mouth 2 (two) times daily.     . cetirizine (ZYRTEC) 10 MG tablet Take 10 mg by mouth daily.    . clobetasol (OLUX) 0.05 % topical foam Apply 1 application topically daily as needed (psoriasis).   2  . cyclobenzaprine (FLEXERIL) 10 MG tablet Take 1 tablet (10 mg total) by mouth 3 (three) times daily as needed for muscle spasms. 30 tablet 2  . exemestane (AROMASIN) 25 MG tablet Take 1 tablet (25 mg total) by mouth daily after breakfast. 90 tablet 3  . fluticasone (FLONASE) 50 MCG/ACT nasal spray SPRAY TWICE INTO EACH NOSTRIL EVERY DAY (Patient taking differently: Place 2 sprays into both nostrils daily. ) 48 mL 3  . hydrochlorothiazide (HYDRODIURIL) 25 MG tablet Take 1 tablet (25 mg total) by mouth daily. 90 tablet 2  . ibuprofen (ADVIL) 600 MG tablet Take 1 tablet (600 mg total) by mouth every 6 (six) hours as needed for moderate pain. For AFTER surgery only 30 tablet 0  . Ketotifen Fumarate (ALLERGY EYE DROPS OP) Place 1 drop into both eyes 2 (two) times daily as needed (allergies).     . losartan (COZAAR) 50 MG tablet TAKE 1 TABLET EVERY DAY 90 tablet 1  . Multiple Vitamin (MULTIVITAMIN WITH  MINERALS) TABS tablet Take 1 tablet by mouth daily.    [name] venlafaxine XR (EFFEXOR-XR) 37.5 MG 24 hr capsule TAKE 1 CAPSULE BY MOUTH DAILY WITH BREAKFAST. 90 capsule 2  . diphenoxylate-atropine (LOMOTIL) 2.5-0.025 MG tablet Take 2 tablets by mouth 4 (four) times daily as needed for diarrhea or loose stools. (Patient not taking: Reported on 07/05/2019) 30 tablet 0   No current facility-administered medications on file prior to visit.   Social History   Socioeconomic History  . Marital status: Married    Spouse name: Not on file  . Number of children: Not on file  . Years of education: Not on file  . Highest education level: Not on file  Occupational History  .  Not on file  Tobacco Use  . Smoking status: Current Every Day Smoker    Packs/day: 0.50    Years: 20.00    Pack years: 10.00    Types: Cigarettes  . Smokeless tobacco: Never Used  Substance and Sexual Activity  . Alcohol use: Yes    Alcohol/week: 10.0 standard drinks    Types: 10 Glasses of wine per week    Comment: Glasses of wine several times a week  . Drug use: No  . Sexual activity: Yes    Birth control/protection: Surgical    Comment: married  Other Topics Concern  . Not on file  Social History Narrative  . Not on file   Social Determinants of Health   Financial Resource Strain:   . Difficulty of Paying Living Expenses:   Food Insecurity:   . Worried About Charity fundraiser in the Last Year:   . Arboriculturist in the Last Year:   Transportation Needs:   . Film/video editor (Medical):   [name] Lack of Transportation (Non-Medical):   Physical Activity:   . Days of Exercise per Week:   . Minutes of Exercise per Session:   Stress:   . Feeling of Stress :   Social Connections:   . Frequency of Communication with Friends and Family:   . Frequency of Social Gatherings with Friends and Family:   . Attends Religious Services:   . Active Member of Clubs or Organizations:   . Attends Archivist  Meetings:   [name] Marital Status:   Intimate Partner Violence:   . Fear of Current or Ex-Partner:   . Emotionally Abused:   [name] Physically Abused:   . Sexually Abused:      Review of Systems  Gastrointestinal: Positive for diarrhea.  Musculoskeletal: Positive for back pain.  All other systems reviewed and are negative.      Objective:   Physical Exam Vitals reviewed.  Constitutional:      General: She is not in acute distress.    Appearance: She is well-developed. She is not diaphoretic.  HENT:     Right Ear: Tympanic membrane and ear canal normal.     Left Ear: Tympanic membrane and ear canal normal.     Nose: No mucosal edema or rhinorrhea.     Right Sinus: No maxillary sinus tenderness.     Left Sinus: No maxillary sinus tenderness.     Mouth/Throat:     Pharynx: No oropharyngeal exudate.  Cardiovascular:     Rate and Rhythm: Normal rate and regular rhythm.     Heart sounds: Normal heart sounds. No murmur heard.   Pulmonary:     Effort: Pulmonary effort is normal. No respiratory distress.     Breath sounds: Normal breath sounds. No wheezing or rales.  Abdominal:     General: Abdomen is flat. Bowel sounds are normal. There is no distension.     Palpations: Abdomen is soft.     Tenderness: There is no abdominal tenderness. There is no guarding.  Musculoskeletal:     Lumbar back: Spasms, tenderness and bony tenderness present. Decreased range of motion.  Neurological:     Mental Status: She is alert and oriented to person, place, and time.     Cranial Nerves: No cranial nerve deficit.     Motor: No abnormal muscle tone.     Coordination: Coordination normal.           Assessment & Plan:  SIADH (syndrome  of inappropriate ADH production) ([place]) - Plan: BASIC METABOLIC PANEL WITH GFR, DG Chest 2 View  Reviewing her previous lab work, she has mild SIADH with sodium levels around 131.  I believe this was likely exacerbated by her diarrhea due to salt loss coupled with  increased free water intake triggering the 123.  I will repeat salt/sodium today.  If still less than 130 I would recommend a work-up for SIADH including a chest x-ray.  Also recommended 30 ounce / 1200 mL a day fluid restriction

## 2019-07-27 ENCOUNTER — Encounter: Payer: Self-pay | Admitting: Family Medicine

## 2019-07-27 DIAGNOSIS — E222 Syndrome of inappropriate secretion of antidiuretic hormone: Secondary | ICD-10-CM | POA: Insufficient documentation

## 2019-07-30 ENCOUNTER — Other Ambulatory Visit: Payer: Self-pay | Admitting: Hematology and Oncology

## 2019-07-30 DIAGNOSIS — Z853 Personal history of malignant neoplasm of breast: Secondary | ICD-10-CM

## 2019-07-30 DIAGNOSIS — Z9889 Other specified postprocedural states: Secondary | ICD-10-CM

## 2019-08-06 ENCOUNTER — Other Ambulatory Visit: Payer: Self-pay | Admitting: Family Medicine

## 2019-08-06 ENCOUNTER — Encounter: Payer: Self-pay | Admitting: Family Medicine

## 2019-08-06 MED ORDER — HYDROCODONE-ACETAMINOPHEN 5-325 MG PO TABS: 1.0000 | ORAL_TABLET | Freq: Four times a day (QID) | ORAL | 0 refills | Status: DC | PRN

## 2019-08-06 NOTE — Telephone Encounter (Signed)
Ok to refill??  Last office visit 07/26/2019.  Last refill 06/08/2019.

## 2019-08-07 ENCOUNTER — Other Ambulatory Visit: Payer: Self-pay

## 2019-08-07 ENCOUNTER — Ambulatory Visit
Admission: RE | Admit: 2019-08-07 | Discharge: 2019-08-07 | Disposition: A | Discharge status: Home / Self Care | Payer: BC Managed Care – PPO | Source: Ambulatory Visit | Attending: Family Medicine | Admitting: Family Medicine

## 2019-08-07 DIAGNOSIS — E222 Syndrome of inappropriate secretion of antidiuretic hormone: Secondary | ICD-10-CM

## 2019-08-08 ENCOUNTER — Other Ambulatory Visit: Payer: Self-pay

## 2019-08-08 MED ORDER — LOSARTAN POTASSIUM 50 MG PO TABS: 50.0000 mg | ORAL_TABLET | Freq: Every day | ORAL | 1 refills | Status: DC

## 2019-08-15 ENCOUNTER — Other Ambulatory Visit: Payer: Self-pay

## 2019-08-21 ENCOUNTER — Other Ambulatory Visit: Payer: Self-pay | Admitting: Family Medicine

## 2019-08-21 ENCOUNTER — Encounter: Payer: Self-pay | Admitting: Family Medicine

## 2019-08-21 MED ORDER — HYDROCODONE-ACETAMINOPHEN 5-325 MG PO TABS: 1.0000 | ORAL_TABLET | Freq: Four times a day (QID) | ORAL | 0 refills | Status: DC | PRN

## 2019-08-21 NOTE — Telephone Encounter (Signed)
Requested Prescriptions   Pending Prescriptions Disp Refills  . HYDROcodone-acetaminophen (NORCO) 5-325 MG tablet 30 tablet 0    Sig: Take 1 tablet by mouth every 6 (six) hours as needed for moderate pain.     Last OV 07/26/2019    Last written 08/06/2019  Pt's message:  HI Dr Dennard Schaumann.  may i please have refill on hydrocodone?  I believe i broke a  toe ( ran into coffee table)...bruised & swollen. been icing, elevating & taping. I have taken more pain pills due to this & limping has exacerbated lower back pain!    Happy to come in if you want to take a look at my toe.   thanks, Kim Ortega

## 2019-09-03 ENCOUNTER — Ambulatory Visit
Admission: RE | Admit: 2019-09-03 | Discharge: 2019-09-03 | Disposition: A | Discharge status: Home / Self Care | Payer: BC Managed Care – PPO | Source: Ambulatory Visit | Attending: Hematology and Oncology | Admitting: Hematology and Oncology

## 2019-09-03 ENCOUNTER — Other Ambulatory Visit: Payer: Self-pay

## 2019-09-03 DIAGNOSIS — Z9889 Other specified postprocedural states: Secondary | ICD-10-CM

## 2019-09-03 DIAGNOSIS — Z853 Personal history of malignant neoplasm of breast: Secondary | ICD-10-CM

## 2019-09-09 ENCOUNTER — Other Ambulatory Visit: Payer: Self-pay | Admitting: Family Medicine

## 2019-10-10 ENCOUNTER — Other Ambulatory Visit: Payer: Self-pay | Admitting: Family Medicine

## 2019-10-15 ENCOUNTER — Other Ambulatory Visit: Payer: Self-pay | Admitting: Family Medicine

## 2019-10-16 MED ORDER — HYDROCODONE-ACETAMINOPHEN 5-325 MG PO TABS: 1.0000 | ORAL_TABLET | Freq: Four times a day (QID) | ORAL | 0 refills | Status: DC | PRN

## 2019-10-16 NOTE — Telephone Encounter (Signed)
Ok to refill??  Last office visit 07/26/2019.  Last refill 08/21/2019.

## 2019-10-29 ENCOUNTER — Other Ambulatory Visit: Payer: Self-pay

## 2019-10-29 ENCOUNTER — Encounter: Payer: Self-pay | Admitting: Family Medicine

## 2019-10-29 MED ORDER — CYCLOBENZAPRINE HCL 10 MG PO TABS: 10.0000 mg | ORAL_TABLET | Freq: Three times a day (TID) | ORAL | 2 refills | Status: DC | PRN

## 2019-11-06 ENCOUNTER — Other Ambulatory Visit: Payer: Self-pay | Admitting: Family Medicine

## 2019-11-17 ENCOUNTER — Other Ambulatory Visit: Payer: Self-pay | Admitting: Hematology and Oncology

## 2019-11-17 ENCOUNTER — Other Ambulatory Visit: Payer: Self-pay | Admitting: Family Medicine

## 2019-11-27 ENCOUNTER — Other Ambulatory Visit: Payer: Self-pay | Admitting: Family Medicine

## 2019-11-27 MED ORDER — HYDROCODONE-ACETAMINOPHEN 5-325 MG PO TABS: 1.0000 | ORAL_TABLET | Freq: Four times a day (QID) | ORAL | 0 refills | Status: DC | PRN

## 2019-11-27 NOTE — Telephone Encounter (Signed)
Ok to refill??  Last office visit 07/26/2019.  Last refill 10/16/2019.

## 2019-12-12 ENCOUNTER — Other Ambulatory Visit: Payer: Self-pay | Admitting: Family Medicine

## 2019-12-20 ENCOUNTER — Other Ambulatory Visit: Payer: Self-pay | Admitting: Family Medicine

## 2019-12-20 MED ORDER — HYDROCODONE-ACETAMINOPHEN 5-325 MG PO TABS: 1.0000 | ORAL_TABLET | Freq: Four times a day (QID) | ORAL | 0 refills | Status: DC | PRN

## 2020-01-02 ENCOUNTER — Other Ambulatory Visit: Payer: Self-pay | Admitting: Family Medicine

## 2020-01-12 ENCOUNTER — Other Ambulatory Visit: Payer: Self-pay | Admitting: Family Medicine

## 2020-01-23 ENCOUNTER — Other Ambulatory Visit: Payer: Self-pay | Admitting: Family Medicine

## 2020-01-24 NOTE — Telephone Encounter (Signed)
Ok to refill??  Last office visit 07/26/2019.  Last refill 12/20/2019.

## 2020-01-25 MED ORDER — HYDROCODONE-ACETAMINOPHEN 5-325 MG PO TABS: 1.0000 | ORAL_TABLET | Freq: Four times a day (QID) | ORAL | 0 refills | Status: DC | PRN

## 2020-01-26 ENCOUNTER — Other Ambulatory Visit: Payer: Self-pay | Admitting: Family Medicine

## 2020-02-10 ENCOUNTER — Other Ambulatory Visit: Payer: Self-pay | Admitting: Family Medicine

## 2020-02-12 ENCOUNTER — Other Ambulatory Visit: Payer: Self-pay | Admitting: Hematology and Oncology

## 2020-02-19 ENCOUNTER — Other Ambulatory Visit: Payer: Self-pay | Admitting: Family Medicine

## 2020-02-19 MED ORDER — HYDROCODONE-ACETAMINOPHEN 5-325 MG PO TABS: 1.0000 | ORAL_TABLET | Freq: Four times a day (QID) | ORAL | 0 refills | Status: DC | PRN

## 2020-03-08 ENCOUNTER — Other Ambulatory Visit: Payer: Self-pay | Admitting: Family Medicine

## 2020-03-10 NOTE — Progress Notes (Signed)
Patient Care Team: [name], MD as PCP - General (Family Medicine) [name], MD as Consulting Physician (Hematology and Oncology) [name], [name], NP as Nurse Practitioner (Hematology and Oncology) Kyung Rudd, MD as Consulting Physician (Radiation Oncology) Rolm Bookbinder, MD as Consulting Physician (General Surgery)  DIAGNOSIS:    ICD-10-CM   1. Malignant neoplasm of lower-inner quadrant of left breast in female, estrogen receptor positive ([place])  C50.312    Z17.0     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 ([place])  06/25/2015 Initial Diagnosis   Left breast biopsy 8:30 position: IDC grade 1, ER 90%, PR 0%, 50%, HER-2 Negative Ratio 1.21, Irregular Mass Left Breast 1 Cm from Nipple 2.5 x 2 x 2.5 Cm with Distortion, T2 N0 Stage II a Clinical Stage, Oncotype DX score 52, 34% ROR   07/30/2015 - 12/16/2015 Neo-Adjuvant Chemotherapy   Neoadjuvant chemotherapy with dose dense Adriamycin and Cytoxan 4 followed by Abraxane weekly 12   12/18/2015 Breast MRI   Excellent interval response to chemotherapy with only minimal residual enhancement, 2 tiny rounded regions along superior margin, unchanged, no abnormal lymph nodes    01/08/2016 Surgery   Left Lumpectomy [name]): IDC grade 1, 0.2 cm, Margins Neg, 0/1 LN Neg; ypT1aN0   02/10/2016 - 03/23/2016 Radiation Therapy   Adjuvant radiation   04/04/2016 -  Anti-estrogen oral therapy   Anastrozole 1 mg daily switched to letrozole 01/04/2017     CHIEF COMPLIANT: Follow-up of left breast cancer on  exemestane therapy  INTERVAL HISTORY: Kim Ortega is a 65 y.o. with above-mentioned history of left breast cancer treated with lumpectomy,radiation, andis currently on exemestane. Mammogram on 09/03/19 showed no evidence of malignancy bilaterally.She presents to the clinic today for follow-up.    Apart from psoriasis she is having no issues or  concerns.  She is tolerating exemestane extremely well.  Denies any lumps or nodules in the breast.  ALLERGIES:  has No Known Allergies.  MEDICATIONS:  Current Outpatient Medications  Medication Sig Dispense Refill  . ALPRAZolam (XANAX) 0.5 MG tablet Take 0.5 mg by mouth 3 (three) times daily as needed for anxiety.    [name] aspirin EC 81 MG tablet Take 81 mg by mouth daily.    [name] atenolol (TENORMIN) 50 MG tablet TAKE 1 TABLET BY MOUTH EVERY DAY 90 tablet 1  . atorvastatin (LIPITOR) 40 MG tablet TAKE 1 TABLET BY MOUTH EVERY DAY 90 tablet 3  . Calcium Carbonate-Vitamin D (CALCIUM-D PO) Take 1 tablet by mouth 2 (two) times daily.     . cetirizine (ZYRTEC) 10 MG tablet Take 10 mg by mouth daily.    . clobetasol (OLUX) 0.05 % topical foam Apply 1 application topically daily as needed (psoriasis).   2  . cyclobenzaprine (FLEXERIL) 10 MG tablet Take 1 tablet (10 mg total) by mouth 3 (three) times daily as needed for muscle spasms. 30 tablet 2  . diphenoxylate-atropine (LOMOTIL) 2.5-0.025 MG tablet Take 2 tablets by mouth 4 (four) times daily as needed for diarrhea or loose stools. (Patient not taking: Reported on 07/05/2019) 30 tablet 0  . exemestane (AROMASIN) 25 MG tablet TAKE 1 TABLET (25 MG TOTAL) BY MOUTH DAILY AFTER BREAKFAST. 90 tablet 3  . fluticasone (FLONASE) 50 MCG/ACT nasal spray SPRAY 2 SPRAYS INTO EACH NOSTRIL EVERY DAY 16 mL 1  . hydrochlorothiazide (HYDRODIURIL) 25 MG tablet TAKE 1 TABLET BY MOUTH EVERY DAY 90 tablet 2  . HYDROcodone-acetaminophen ([place])  5-325 MG tablet Take 1 tablet by mouth every 6 (six) hours as needed for moderate pain. 30 tablet 0  . ibuprofen (ADVIL) 600 MG tablet Take 1 tablet (600 mg total) by mouth every 6 (six) hours as needed for moderate pain. For AFTER surgery only 30 tablet 0  . Ketotifen Fumarate (ALLERGY EYE DROPS OP) Place 1 drop into both eyes 2 (two) times daily as needed (allergies).     . losartan (COZAAR) 50 MG tablet TAKE 1 TABLET BY MOUTH EVERY DAY 90  tablet 1  . Multiple Vitamin (MULTIVITAMIN WITH MINERALS) TABS tablet Take 1 tablet by mouth daily.    . ondansetron (ZOFRAN) 4 MG tablet TAKE 1 TABLET BY MOUTH EVERY 8 HOURS AS NEEDED FOR NAUSEA AND VOMITING 18 tablet 1  . venlafaxine XR (EFFEXOR-XR) 37.5 MG 24 hr capsule TAKE 1 CAPSULE BY MOUTH DAILY WITH BREAKFAST. 90 capsule 2   No current facility-administered medications for this visit.    PHYSICAL EXAMINATION: ECOG PERFORMANCE STATUS: 1 - Symptomatic but completely ambulatory  Vitals:   03/11/20 1350  BP: (!) 149/81  Pulse: 78  Resp: 20  Temp: 97.9 F (36.6 C)  SpO2: 100%   Filed Weights   03/11/20 1350  Weight: 116 lb 6.4 oz (52.8 kg)    BREAST: No palpable masses or nodules in either right or left breasts. No palpable axillary supraclavicular or infraclavicular adenopathy no breast tenderness or nipple discharge. (exam performed in the presence of a chaperone)  LABORATORY DATA:  I have reviewed the data as listed CMP Latest Ref Rng & Units 07/26/2019 07/13/2019 06/08/2019  Glucose 65 - 99 mg/dL 113(H) 96 88  BUN 7 - 25 mg/dL 12 7 10   Creatinine 0.50 - 0.99 mg/dL 0.69 0.57 0.70  Sodium 135 - 146 mmol/L 130(L) 123(L) 131(L)  Potassium 3.5 - 5.3 mmol/L 4.2 4.0 3.7  Chloride 98 - 110 mmol/L 93(L) 86(L) 91(L)  CO2 20 - 32 mmol/L 29 28 31   Calcium 8.6 - 10.4 mg/dL 9.7 9.3 9.5  Total Protein 6.1 - 8.1 g/dL - 6.7 6.8  Total Bilirubin 0.2 - 1.2 mg/dL - 0.8 0.5  Alkaline Phos 38 - 126 U/L - - -  AST 10 - 35 U/L - 20 42(H)  ALT 6 - 29 U/L - 15 42(H)    Lab Results  Component Value Date   WBC 7.6 06/08/2019   HGB 12.5 06/08/2019   HCT 36.0 06/08/2019   MCV 98.4 06/08/2019   PLT 318 06/08/2019   NEUTROABS 5,031 06/08/2019    ASSESSMENT & PLAN:  Breast cancer of lower-inner quadrant of left female breast ([place]) Left breast biopsy 8:30 position: IDC grade 1, ER 90%, PR 0%, 50%, HER-2 Negative Ratio 1.21, Irregular Mass Left Breast 1 Cm from Nipple 2.5 x 2 x 2.5 Cm with  Distortion,  T2 N0 Stage II a Clinical Stage Oncotype DX score 52, 34% risk of recurrence without chemotherapy  Treatment Summary: 1. Neoadjuvant chemotherapy with dose dense Adriamycin and Cytoxan 4 followed by Abraxane weekly 12; started 07/30/2015 completed 12/16/2015  2. 1/4/17Left Lumpectomy: IDC grade 1, 0.2 cm, Margins Neg, 0/1 LN Neg; ypT1aN0 3. Adjuvant radiation therapy 02/10/16- 03/23/16  Current treatment: Adjuvant anastrozole 1 mg daily 5-10 yearsswitched to letrozole 11/19/2016 due to musculoskeletal aches and pains, switched to exemestane 03/12/2019 because of hot flashes (tolerating it extremely well)   Breast cancer surveillance: 1.Breast exam 03/11/2020: Benign 2.Mammogram: 09/03/2019: No evidence of malignancy, breast density category C CT abdomen and MRI  abdomen: Cystic left adnexal lesion (should be followed for stability annually)  Psoriasis: Okay for her to receive biologic treatments.  We will call Dr. Maurie Boettcher office and inform them. [facility]  We will get a copy of that report.  Return to clinic in1 yearfor follow-up    No orders of the defined types were placed in this encounter.  The patient has a good understanding of the overall plan. she agrees with it. she will call with any problems that may develop before the next visit here.  Total time spent: 20 mins including face to face time and time spent for planning, charting and coordination of care  Rulon Eisenmenger, MD, MPH 03/11/2020  I, Molly Dorshimer, am acting as scribe for Dr. [name].  I have reviewed the above documentation for accuracy and completeness, and I agree with the above.

## 2020-03-11 ENCOUNTER — Other Ambulatory Visit: Payer: Self-pay

## 2020-03-11 ENCOUNTER — Telehealth: Payer: Self-pay

## 2020-03-11 ENCOUNTER — Telehealth: Payer: Self-pay | Admitting: Hematology and Oncology

## 2020-03-11 ENCOUNTER — Inpatient Hospital Stay: Payer: BC Managed Care – PPO | Attending: Hematology and Oncology | Admitting: Hematology and Oncology

## 2020-03-11 DIAGNOSIS — L409 Psoriasis, unspecified: Secondary | ICD-10-CM | POA: Insufficient documentation

## 2020-03-11 DIAGNOSIS — Z79811 Long term (current) use of aromatase inhibitors: Secondary | ICD-10-CM | POA: Diagnosis not present

## 2020-03-11 DIAGNOSIS — Z9221 Personal history of antineoplastic chemotherapy: Secondary | ICD-10-CM | POA: Diagnosis not present

## 2020-03-11 DIAGNOSIS — Z79899 Other long term (current) drug therapy: Secondary | ICD-10-CM | POA: Diagnosis not present

## 2020-03-11 DIAGNOSIS — C50312 Malignant neoplasm of lower-inner quadrant of left female breast: Secondary | ICD-10-CM | POA: Diagnosis not present

## 2020-03-11 DIAGNOSIS — Z923 Personal history of irradiation: Secondary | ICD-10-CM | POA: Insufficient documentation

## 2020-03-11 DIAGNOSIS — Z170 Estrogen receptor positive status [ER+]: Secondary | ICD-10-CM | POA: Diagnosis not present

## 2020-03-11 NOTE — Telephone Encounter (Signed)
RN placed call to Dr. Delman Cheadle, dermatology, left voicemail on triage line per Dr. Lindi Adie OK for patient's to receive biologic treatments for psoriasis.    Successfully faxed progress note to Dr. Delman Cheadle [phone].

## 2020-03-11 NOTE — Assessment & Plan Note (Signed)
Left breast biopsy 8:30 position: IDC grade 1, ER 90%, PR 0%, 50%, HER-2 Negative Ratio 1.21, Irregular Mass Left Breast 1 Cm from Nipple 2.5 x 2 x 2.5 Cm with Distortion,  T2 N0 Stage II a Clinical Stage Oncotype DX score 52, 34% risk of recurrence without chemotherapy  Treatment Summary: 1. Neoadjuvant chemotherapy with dose dense Adriamycin and Cytoxan 4 followed by Abraxane weekly 12; started 07/30/2015 completed 12/16/2015  2. 1/4/17Left Lumpectomy: IDC grade 1, 0.2 cm, Margins Neg, 0/1 LN Neg; ypT1aN0 3. Adjuvant radiation therapy 02/10/16- 03/23/16  Plan: Adjuvant anastrozole 1 mg daily 5-10 yearsswitched to letrozole 11/19/2016 due to musculoskeletal aches and pains, switched to exemestane 03/12/2019 because of hot flashes  Letrozoletoxicities: 1.Hot flashes and sweats that keep her up at night: I sent a prescription for Effexor. She would like to switch to exemestane and see if it makes any difference.  I sent a prescription for exemestane.  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  She is willing to help with the vaccination) will contact [place] about volunteering for that.  Return to clinic in1 yearfor follow-up

## 2020-03-11 NOTE — Telephone Encounter (Signed)
Scheduled appts per 3/8 los. Pt stated she would refer to mychart for AVS and appts.

## 2020-03-19 ENCOUNTER — Other Ambulatory Visit: Payer: Self-pay | Admitting: Family Medicine

## 2020-03-20 MED ORDER — HYDROCODONE-ACETAMINOPHEN 5-325 MG PO TABS: 1.0000 | ORAL_TABLET | Freq: Four times a day (QID) | ORAL | 0 refills | Status: DC | PRN

## 2020-03-21 ENCOUNTER — Other Ambulatory Visit: Payer: Self-pay | Admitting: Nurse Practitioner

## 2020-03-21 MED ORDER — HYDROCODONE-ACETAMINOPHEN 10-325 MG PO TABS: 1.0000 | ORAL_TABLET | Freq: Three times a day (TID) | ORAL | 0 refills | Status: AC | PRN

## 2020-03-21 NOTE — Progress Notes (Addendum)
Received page from patient while on-call.  Patient states her pharmacy is unable to fill the hydrocodone-acetaminophen 5-325 prescription sent in 03/20/2020 due to a national back order.  I called pharmacy, they are unable to fill this prescription.  I verbally cancelled hydrocodone-acetaminophen 5-325 prescription.  I verified patient's PDMP - is appropriate.  Pharmacy has hydrocodone-acetaminophen 10-325 in stock.  Prescription sent in for hydrocodone-acetaminophen 10-325 one half tablet q8 hours prn #15 to replace previous prescription.

## 2020-03-24 ENCOUNTER — Other Ambulatory Visit: Payer: Self-pay | Admitting: *Deleted

## 2020-03-24 NOTE — Telephone Encounter (Signed)
Medication is not available at [facility] to send to [facility]?

## 2020-04-15 ENCOUNTER — Encounter: Payer: Self-pay | Admitting: Family Medicine

## 2020-04-15 MED ORDER — CYCLOBENZAPRINE HCL 10 MG PO TABS: 10.0000 mg | ORAL_TABLET | Freq: Three times a day (TID) | ORAL | 2 refills | Status: DC | PRN

## 2020-04-15 MED ORDER — HYDROCODONE-ACETAMINOPHEN 5-325 MG PO TABS: 1.0000 | ORAL_TABLET | Freq: Four times a day (QID) | ORAL | 0 refills | Status: DC | PRN

## 2020-04-15 NOTE — Telephone Encounter (Signed)
Ok to refill??  Last office visit 07/26/2019  Last refill 03/21/2020.

## 2020-04-28 ENCOUNTER — Other Ambulatory Visit: Payer: Self-pay

## 2020-04-28 MED ORDER — ATORVASTATIN CALCIUM 40 MG PO TABS: 1.0000 | ORAL_TABLET | Freq: Every day | ORAL | 3 refills | Status: DC

## 2020-05-07 ENCOUNTER — Other Ambulatory Visit: Payer: Self-pay | Admitting: Family Medicine

## 2020-05-07 NOTE — Telephone Encounter (Signed)
Ok to refill??  Last office visit 03/21/2020.  Last refill 04/15/2020.

## 2020-05-08 MED ORDER — HYDROCODONE-ACETAMINOPHEN 5-325 MG PO TABS: 1.0000 | ORAL_TABLET | Freq: Four times a day (QID) | ORAL | 0 refills | Status: DC | PRN

## 2020-05-15 ENCOUNTER — Other Ambulatory Visit: Payer: Self-pay | Admitting: *Deleted

## 2020-05-15 MED ORDER — ATENOLOL 50 MG PO TABS: 50.0000 mg | ORAL_TABLET | Freq: Every day | ORAL | 0 refills | Status: DC

## 2020-05-21 ENCOUNTER — Other Ambulatory Visit: Payer: Self-pay | Admitting: *Deleted

## 2020-05-21 MED ORDER — FLUTICASONE PROPIONATE 50 MCG/ACT NA SUSP: NASAL | 0 refills | Status: DC

## 2020-06-17 ENCOUNTER — Other Ambulatory Visit: Payer: Self-pay

## 2020-06-17 ENCOUNTER — Ambulatory Visit: Payer: Medicare PPO | Admitting: Family Medicine

## 2020-06-17 VITALS — BP 128/86 | HR 78 | Temp 97.4°F | Ht 64.0 in | Wt 114.0 lb

## 2020-06-17 DIAGNOSIS — L409 Psoriasis, unspecified: Secondary | ICD-10-CM | POA: Diagnosis not present

## 2020-06-17 DIAGNOSIS — E7800 Pure hypercholesterolemia, unspecified: Secondary | ICD-10-CM | POA: Diagnosis not present

## 2020-06-17 DIAGNOSIS — G8929 Other chronic pain: Secondary | ICD-10-CM

## 2020-06-17 DIAGNOSIS — M545 Low back pain, unspecified: Secondary | ICD-10-CM | POA: Diagnosis not present

## 2020-06-17 DIAGNOSIS — I6522 Occlusion and stenosis of left carotid artery: Secondary | ICD-10-CM | POA: Diagnosis not present

## 2020-06-17 LAB — CBC WITH DIFFERENTIAL/PLATELET
Absolute Monocytes: 980 cells/uL — ABNORMAL HIGH (ref 200–950)
Basophils Absolute: 41 cells/uL (ref 0–200)
Basophils Relative: 0.5 %
Eosinophils Absolute: 49 cells/uL (ref 15–500)
Eosinophils Relative: 0.6 %
HCT: 38.6 % (ref 35.0–45.0)
Hemoglobin: 13.4 g/dL (ref 11.7–15.5)
Lymphs Abs: 2106 cells/uL (ref 850–3900)
MCH: 34.9 pg — ABNORMAL HIGH (ref 27.0–33.0)
MCHC: 34.7 g/dL (ref 32.0–36.0)
MCV: 100.5 fL — ABNORMAL HIGH (ref 80.0–100.0)
MPV: 9 fL (ref 7.5–12.5)
Monocytes Relative: 12.1 %
Neutro Abs: 4925 cells/uL (ref 1500–7800)
Neutrophils Relative %: 60.8 %
Platelets: 310 10*3/uL (ref 140–400)
RBC: 3.84 10*6/uL (ref 3.80–5.10)
RDW: 12.7 % (ref 11.0–15.0)
Total Lymphocyte: 26 %
WBC: 8.1 10*3/uL (ref 3.8–10.8)

## 2020-06-17 LAB — COMPLETE METABOLIC PANEL WITH GFR
AG Ratio: 1.5 (calc) (ref 1.0–2.5)
ALT: 19 U/L (ref 6–29)
AST: 25 U/L (ref 10–35)
Albumin: 4.2 g/dL (ref 3.6–5.1)
Alkaline phosphatase (APISO): 77 U/L (ref 37–153)
BUN: 12 mg/dL (ref 7–25)
CO2: 29 mmol/L (ref 20–32)
Calcium: 9.6 mg/dL (ref 8.6–10.4)
Chloride: 96 mmol/L — ABNORMAL LOW (ref 98–110)
Creat: 0.73 mg/dL (ref 0.50–0.99)
GFR, Est African American: 100 mL/min/{1.73_m2} (ref >=60)
GFR, Est Non African American: 86 mL/min/{1.73_m2} (ref >=60)
Globulin: 2.8 g/dL (calc) (ref 1.9–3.7)
Glucose, Bld: 101 mg/dL — ABNORMAL HIGH (ref 65–99)
Potassium: 4.5 mmol/L (ref 3.5–5.3)
Sodium: 133 mmol/L — ABNORMAL LOW (ref 135–146)
Total Bilirubin: 0.5 mg/dL (ref 0.2–1.2)
Total Protein: 7 g/dL (ref 6.1–8.1)

## 2020-06-17 LAB — LIPID PANEL
Cholesterol: 141 mg/dL (ref <200)
HDL: 90 mg/dL (ref >=50)
LDL Cholesterol (Calc): 38 mg/dL (calc)
Non-HDL Cholesterol (Calc): 51 mg/dL (calc) (ref <130)
Total CHOL/HDL Ratio: 1.6 (calc) (ref <5.0)
Triglycerides: 47 mg/dL (ref <150)

## 2020-06-17 MED ORDER — HYDROCODONE-ACETAMINOPHEN 5-325 MG PO TABS: 1.0000 | ORAL_TABLET | Freq: Four times a day (QID) | ORAL | 0 refills | Status: DC | PRN

## 2020-06-17 MED ORDER — MELOXICAM 15 MG PO TABS: 15.0000 mg | ORAL_TABLET | Freq: Every day | ORAL | 0 refills | Status: DC

## 2020-06-17 NOTE — Progress Notes (Signed)
Subjective:    Patient ID: Kim Ortega, female    DOB: 1955/07/30, 65 y.o.   MRN: [id number]   MRI 2019-   T12-L1: No significant disc bulge. No evidence of neural foraminal stenosis. No central canal stenosis.   L1-L2: No significant disc bulge. No evidence of neural foraminal stenosis. No central canal stenosis.   L2-L3: Mild broad-based disc bulge. No evidence of neural foraminal stenosis. No central canal stenosis.   L3-L4: Broad-based disc bulge flattening the ventral thecal sac. Bilateral lateral recess narrowing. No evidence of neural foraminal stenosis. No central canal stenosis.   L4-L5: Broad-based disc bulge eccentric towards the right. No evidence of neural foraminal stenosis. No central canal stenosis.   L5-S1: No significant disc bulge. No evidence of neural foraminal stenosis. No central canal stenosis.  Patient is here today requesting a refill on her pain medication.  She uses about 1 hydrocodone a day on average.  This is for chronic pain in her lower back primarily on the left-hand side just above her gluteus.  She denies any sciatica or leg weakness.  She also takes ibuprofen on a daily basis to try to avoid having to take hydrocodone.  She also develops muscle spasms that she benefits of taking Flexeril at night to help treat the muscle spasms.  This combination has made her back pain tolerable and has improved her quality of life.  Her blood pressure today is well controlled.  Unfortunately she continues to smoke and we have discussed smoking cessation in the past.  She denies any chest pain shortness of breath or dyspnea on exertion. Past Medical History:  Diagnosis Date   Allergy    Anxiety    mild no regular meds   Arthritis    psoriatic arthritis -back   Breast cancer ([place]) 06/25/15 bx   left breastinvasive ductal ca ,grade 1   Episcleritis    Hormone replacement therapy    Hypertension    Left carotid artery stenosis    40-59% (01/2015)    Personal history of chemotherapy 2017   neoadjuvant   Personal history of radiation therapy 2018   Psoriasis    SIADH (syndrome of inappropriate ADH production) ([place])    Smoker    Past Surgical History:  Procedure Laterality Date   ABDOMINAL HYSTERECTOMY     BREAST BIOPSY Left 06/2015   BREAST LUMPECTOMY Left 01/2016   BREAST LUMPECTOMY WITH RADIOACTIVE SEED AND SENTINEL LYMPH NODE BIOPSY Left 01/08/2016   Procedure: LEFT BREAST LUMPECTOMY WITH RADIOACTIVE SEED AND SENTINEL LYMPH NODE BIOPSY, BLUE DYE INJECTION;  Surgeon: Rolm Bookbinder, MD;  Location: [facility];  Service: General;  Laterality: Left;   cervical acdf     C5-6- fusion with plates/screws "Dr. Sherwood Gambler" '12   COLONOSCOPY WITH PROPOFOL N/A 04/16/2014   Procedure: COLONOSCOPY WITH PROPOFOL;  Surgeon: Garlan Fair, MD;  Location: [facility];  Service: Endoscopy;  Laterality: N/A;   COLPOSCOPY     laporotomy     Ovarian cyst   PORT-A-CATH REMOVAL Right 01/08/2016   Procedure: REMOVAL PORT-A-CATH;  Surgeon: Rolm Bookbinder, MD;  Location: [facility];  Service: General;  Laterality: Right;   PORTACATH PLACEMENT N/A 07/24/2015   Procedure: INSERTION PORT-A-CATH WITH [place];  Surgeon: Rolm Bookbinder, MD;  Location: [facility];  Service: General;  Laterality: N/A;   ROBOTIC ASSISTED SALPINGO OOPHERECTOMY Bilateral 10/10/2018   Procedure: XI ROBOTIC ASSISTED SALPINGO OOPHORECTOMY;  Surgeon: Everitt Amber, MD;  Location: [facility];  Service: Gynecology;  Laterality: Bilateral;   TONSILLECTOMY     child   No Known Allergies Current Outpatient Medications on File Prior to Visit  Medication Sig Dispense Refill   Guselkumab (TREMFYA) 100 MG/ML SOPN Inject into the skin. Every 8 weeks     ALPRAZolam (XANAX) 0.5 MG tablet Take 0.5 mg by mouth 3 (three) times daily as needed for anxiety.     aspirin EC 81 MG tablet Take 81 mg by mouth daily.     atenolol (TENORMIN) 50 MG tablet Take 1 tablet (50 mg total) by  mouth daily. 90 tablet 0   atorvastatin (LIPITOR) 40 MG tablet Take 1 tablet (40 mg total) by mouth daily. 90 tablet 3   Calcium Carbonate-Vitamin D (CALCIUM-D PO) Take 1 tablet by mouth 2 (two) times daily.      cyclobenzaprine (FLEXERIL) 10 MG tablet Take 1 tablet (10 mg total) by mouth 3 (three) times daily as needed for muscle spasms. 30 tablet 2   exemestane (AROMASIN) 25 MG tablet TAKE 1 TABLET (25 MG TOTAL) BY MOUTH DAILY AFTER BREAKFAST. 90 tablet 3   fluticasone (FLONASE) 50 MCG/ACT nasal spray SPRAY 2 SPRAYS INTO EACH NOSTRIL EVERY DAY 16 mL 0   hydrochlorothiazide (HYDRODIURIL) 25 MG tablet TAKE 1 TABLET BY MOUTH EVERY DAY 90 tablet 2   HYDROcodone-acetaminophen ([place]) 5-325 MG tablet Take 1 tablet by mouth every 6 (six) hours as needed for moderate pain. 30 tablet 0   ibuprofen (ADVIL) 600 MG tablet Take 1 tablet (600 mg total) by mouth every 6 (six) hours as needed for moderate pain. For AFTER surgery only 30 tablet 0   Ketotifen Fumarate (ALLERGY EYE DROPS OP) Place 1 drop into both eyes 2 (two) times daily as needed (allergies).      losartan (COZAAR) 50 MG tablet TAKE 1 TABLET BY MOUTH EVERY DAY 90 tablet 1   Multiple Vitamin (MULTIVITAMIN WITH MINERALS) TABS tablet Take 1 tablet by mouth daily.     venlafaxine XR (EFFEXOR-XR) 37.5 MG 24 hr capsule TAKE 1 CAPSULE BY MOUTH DAILY WITH BREAKFAST. 90 capsule 2   No current facility-administered medications on file prior to visit.   Social History   Socioeconomic History   Marital status: Married    Spouse name: Not on file   Number of children: Not on file   Years of education: Not on file   Highest education level: Not on file  Occupational History   Not on file  Tobacco Use   Smoking status: Every Day    Packs/day: 0.50    Years: 20.00    Pack years: 10.00    Types: Cigarettes   Smokeless tobacco: Never  Substance and Sexual Activity   Alcohol use: Yes    Alcohol/week: 10.0 standard drinks    Types: 10 Glasses of  wine per week    Comment: Glasses of wine several times a week   Drug use: No   Sexual activity: Yes    Birth control/protection: Surgical    Comment: married  Other Topics Concern   Not on file  Social History Narrative   Not on file   Social Determinants of Health   Financial Resource Strain: Not on file  Food Insecurity: Not on file  Transportation Needs: Not on file  Physical Activity: Not on file  Stress: Not on file  Social Connections: Not on file  Intimate Partner Violence: Not on file     Review of Systems  Gastrointestinal:  Positive for diarrhea.  Musculoskeletal:  Positive for back pain.  All other systems reviewed and are negative.     Objective:   Physical Exam Vitals reviewed.  Constitutional:      General: She is not in acute distress.    Appearance: She is well-developed. She is not diaphoretic.  HENT:     Right Ear: Tympanic membrane and ear canal normal.     Left Ear: Tympanic membrane and ear canal normal.     Nose: No mucosal edema or rhinorrhea.     Right Sinus: No maxillary sinus tenderness.     Left Sinus: No maxillary sinus tenderness.     Mouth/Throat:     Pharynx: No oropharyngeal exudate.  Cardiovascular:     Rate and Rhythm: Normal rate and regular rhythm.     Heart sounds: Normal heart sounds. No murmur heard. Pulmonary:     Effort: Pulmonary effort is normal. No respiratory distress.     Breath sounds: Normal breath sounds. No wheezing or rales.  Abdominal:     General: Abdomen is flat. Bowel sounds are normal. There is no distension.     Palpations: Abdomen is soft.     Tenderness: There is no abdominal tenderness. There is no guarding.  Musculoskeletal:     Lumbar back: Bony tenderness present. No spasms or tenderness. Decreased range of motion.  Neurological:     Mental Status: She is alert and oriented to person, place, and time.     Cranial Nerves: No cranial nerve deficit.     Motor: No abnormal muscle tone.      Coordination: Coordination normal.          Assessment & Plan:  Pure hypercholesterolemia - Plan: CBC with Differential/Platelet, COMPLETE METABOLIC PANEL WITH GFR, Lipid panel  Left carotid artery stenosis  Psoriasis  Chronic left-sided low back pain without sciatica Blood pressure today is excellent.  I will check a CBC CMP and a lipid panel.  Given her history of left carotid artery stenosis, I would like her LDL cholesterol to be below 70 which is the reason she is taking atorvastatin.  I will refill her hydrocodone which she is using for her low back pain once a day.  However I recommended trying to add meloxicam 15 mg a day to see if this would help manage her low back pain and reduce the frequency that she needs to hydrocodone.

## 2020-06-18 ENCOUNTER — Other Ambulatory Visit: Payer: Self-pay | Admitting: Family Medicine

## 2020-06-25 ENCOUNTER — Other Ambulatory Visit: Payer: Self-pay | Admitting: Hematology and Oncology

## 2020-06-25 ENCOUNTER — Other Ambulatory Visit: Payer: Self-pay | Admitting: Family Medicine

## 2020-07-17 ENCOUNTER — Other Ambulatory Visit: Payer: Self-pay | Admitting: Family Medicine

## 2020-07-21 ENCOUNTER — Other Ambulatory Visit: Payer: Self-pay | Admitting: Family Medicine

## 2020-07-21 MED ORDER — HYDROCODONE-ACETAMINOPHEN 5-325 MG PO TABS: 1.0000 | ORAL_TABLET | Freq: Four times a day (QID) | ORAL | 0 refills | Status: DC | PRN

## 2020-07-29 ENCOUNTER — Other Ambulatory Visit: Payer: Self-pay | Admitting: Family Medicine

## 2020-08-01 ENCOUNTER — Other Ambulatory Visit: Payer: Self-pay | Admitting: Hematology and Oncology

## 2020-08-01 DIAGNOSIS — Z853 Personal history of malignant neoplasm of breast: Secondary | ICD-10-CM

## 2020-08-11 ENCOUNTER — Other Ambulatory Visit: Payer: Self-pay | Admitting: Family Medicine

## 2020-08-18 ENCOUNTER — Other Ambulatory Visit: Payer: Self-pay | Admitting: Family Medicine

## 2020-08-20 ENCOUNTER — Encounter: Payer: Self-pay | Admitting: Family Medicine

## 2020-08-20 NOTE — Telephone Encounter (Signed)
Ok to refill??  Last office visit 06/17/2020.  Last refill 07/21/2020.

## 2020-08-21 MED ORDER — HYDROCODONE-ACETAMINOPHEN 5-325 MG PO TABS: 1.0000 | ORAL_TABLET | Freq: Four times a day (QID) | ORAL | 0 refills | Status: DC | PRN

## 2020-09-04 ENCOUNTER — Other Ambulatory Visit: Payer: Self-pay

## 2020-09-04 ENCOUNTER — Ambulatory Visit
Admission: RE | Admit: 2020-09-04 | Discharge: 2020-09-04 | Disposition: A | Discharge status: Home / Self Care | Payer: Medicare PPO | Source: Ambulatory Visit | Attending: Hematology and Oncology | Admitting: Hematology and Oncology

## 2020-09-04 DIAGNOSIS — Z853 Personal history of malignant neoplasm of breast: Secondary | ICD-10-CM

## 2020-09-04 DIAGNOSIS — R922 Inconclusive mammogram: Secondary | ICD-10-CM | POA: Diagnosis not present

## 2020-09-25 DIAGNOSIS — Z6820 Body mass index (BMI) 20.0-20.9, adult: Secondary | ICD-10-CM | POA: Diagnosis not present

## 2020-09-25 DIAGNOSIS — Z1272 Encounter for screening for malignant neoplasm of vagina: Secondary | ICD-10-CM | POA: Diagnosis not present

## 2020-09-25 DIAGNOSIS — Z90710 Acquired absence of both cervix and uterus: Secondary | ICD-10-CM | POA: Diagnosis not present

## 2020-09-25 DIAGNOSIS — Z124 Encounter for screening for malignant neoplasm of cervix: Secondary | ICD-10-CM | POA: Diagnosis not present

## 2020-10-07 ENCOUNTER — Other Ambulatory Visit: Payer: Self-pay | Admitting: Family Medicine

## 2020-10-09 ENCOUNTER — Telehealth: Payer: Self-pay

## 2020-10-09 ENCOUNTER — Other Ambulatory Visit: Payer: Self-pay | Admitting: Family Medicine

## 2020-10-09 MED ORDER — HYDROCODONE-ACETAMINOPHEN 5-325 MG PO TABS: 1.0000 | ORAL_TABLET | Freq: Four times a day (QID) | ORAL | 0 refills | Status: DC | PRN

## 2020-10-09 NOTE — Telephone Encounter (Signed)
Ok to refill??  Last office visit 06/17/2020.   Last refill 08/21/2020.

## 2020-10-09 NOTE — Telephone Encounter (Signed)
Pt called in requesting a refill HYDROcodone-acetaminophen   Cb#: [phone]

## 2020-10-09 NOTE — Telephone Encounter (Signed)
Prescription sent to pharmacy in another message.

## 2020-10-16 ENCOUNTER — Other Ambulatory Visit: Payer: Self-pay | Admitting: Family Medicine

## 2020-10-27 ENCOUNTER — Other Ambulatory Visit: Payer: Self-pay | Admitting: Family Medicine

## 2020-10-27 NOTE — Telephone Encounter (Signed)
Ok to refill 

## 2020-11-04 ENCOUNTER — Other Ambulatory Visit: Payer: Self-pay | Admitting: Family Medicine

## 2020-11-04 MED ORDER — HYDROCODONE-ACETAMINOPHEN 5-325 MG PO TABS: 1.0000 | ORAL_TABLET | Freq: Four times a day (QID) | ORAL | 0 refills | Status: DC | PRN

## 2020-11-04 NOTE — Telephone Encounter (Signed)
LOV for back pain 01/13/18 LOV w/provider 06/17/20 Last refill 10/09/20 #30  Please advise. Thank you!

## 2020-11-05 DIAGNOSIS — L853 Xerosis cutis: Secondary | ICD-10-CM | POA: Diagnosis not present

## 2020-11-05 DIAGNOSIS — L409 Psoriasis, unspecified: Secondary | ICD-10-CM | POA: Diagnosis not present

## 2020-11-05 DIAGNOSIS — L503 Dermatographic urticaria: Secondary | ICD-10-CM | POA: Diagnosis not present

## 2020-11-05 DIAGNOSIS — Z79899 Other long term (current) drug therapy: Secondary | ICD-10-CM | POA: Diagnosis not present

## 2020-11-08 ENCOUNTER — Other Ambulatory Visit: Payer: Self-pay | Admitting: Family Medicine

## 2020-11-24 ENCOUNTER — Other Ambulatory Visit: Payer: Self-pay | Admitting: Family Medicine

## 2020-11-24 ENCOUNTER — Other Ambulatory Visit: Payer: Self-pay | Admitting: Hematology and Oncology

## 2020-11-24 MED ORDER — HYDROCODONE-ACETAMINOPHEN 5-325 MG PO TABS: 1.0000 | ORAL_TABLET | Freq: Four times a day (QID) | ORAL | 0 refills | Status: DC | PRN

## 2020-11-24 NOTE — Telephone Encounter (Signed)
PDMP reviewed.  Appears that patient takes this medication chronically.  Refill given in place of PCP who is out of the office.

## 2020-11-26 ENCOUNTER — Encounter: Payer: Self-pay | Admitting: Nurse Practitioner

## 2020-11-26 ENCOUNTER — Other Ambulatory Visit: Payer: Self-pay

## 2020-11-26 ENCOUNTER — Ambulatory Visit: Payer: Medicare PPO | Admitting: Nurse Practitioner

## 2020-11-26 VITALS — BP 144/90 | HR 70 | Temp 97.2°F | Ht 64.0 in | Wt 116.8 lb

## 2020-11-26 DIAGNOSIS — B309 Viral conjunctivitis, unspecified: Secondary | ICD-10-CM | POA: Diagnosis not present

## 2020-11-26 NOTE — Progress Notes (Signed)
Subjective:    Patient ID: Kim Ortega, female    DOB: [birth year], 65 y.o.   MRN: [id number]  HPI: Kim Ortega is a 65 y.o. female presenting for eye problem.  Chief Complaint  Patient presents with   Follow-up    L eye swollen,painful itching sensitive to light    EYE PAIN Duration:  days - yesterday am  Involved eye:  left Onset: sudden Severity: mild  Quality: sore Foreign body sensation:yes Visual impairment: no Eye redness: yes Discharge: yes Crusting or matting of eyelids: yes Swelling: yes Photophobia: yes; "a little" Itching: yes Tearing: yes Headache: no Floaters: no URI symptoms: no Contact lens use: no Close contacts with similar problems: no Eye trauma: no Aggravating factors: night time Alleviating factors: eye drops - allergy and lubrication  No Known Allergies  Outpatient Encounter Medications as of 11/26/2020  Medication Sig Note   ALPRAZolam (XANAX) 0.5 MG tablet Take 0.5 mg by mouth 3 (three) times daily as needed for anxiety.    aspirin EC 81 MG tablet Take 81 mg by mouth daily. 10/10/2018: Restart 1 week postop   atenolol (TENORMIN) 50 MG tablet TAKE 1 TABLET BY MOUTH EVERY DAY    atorvastatin (LIPITOR) 40 MG tablet Take 1 tablet (40 mg total) by mouth daily.    Calcium Carbonate-Vitamin D (CALCIUM-D PO) Take 1 tablet by mouth 2 (two) times daily.     cyclobenzaprine (FLEXERIL) 10 MG tablet TAKE 1 TABLET BY MOUTH THREE TIMES A DAY AS NEEDED FOR MUSCLE SPASMS    exemestane (AROMASIN) 25 MG tablet TAKE 1 TABLET BY MOUTH EVERY DAY AFTER BREAKFAST    fluticasone (FLONASE) 50 MCG/ACT nasal spray SPRAY 2 SPRAYS INTO EACH NOSTRIL EVERY DAY    Guselkumab (TREMFYA) 100 MG/ML SOPN Inject into the skin. Every 8 weeks    hydrochlorothiazide (HYDRODIURIL) 25 MG tablet TAKE 1 TABLET BY MOUTH EVERY DAY    HYDROcodone-acetaminophen (NORCO) 5-325 MG tablet Take 1 tablet by mouth every 6 (six) hours as needed for moderate pain.    ibuprofen  (ADVIL) 600 MG tablet Take 1 tablet (600 mg total) by mouth every 6 (six) hours as needed for moderate pain. For AFTER surgery only    Ketotifen Fumarate (ALLERGY EYE DROPS OP) Place 1 drop into both eyes 2 (two) times daily as needed (allergies).     losartan (COZAAR) 50 MG tablet TAKE 1 TABLET BY MOUTH EVERY DAY    meloxicam (MOBIC) 15 MG tablet TAKE 1 TABLET (15 MG TOTAL) BY MOUTH DAILY.    Multiple Vitamin (MULTIVITAMIN WITH MINERALS) TABS tablet Take 1 tablet by mouth daily.    venlafaxine XR (EFFEXOR-XR) 37.5 MG 24 hr capsule TAKE 1 CAPSULE BY MOUTH DAILY WITH BREAKFAST.    No facility-administered encounter medications on file as of 11/26/2020.    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   Antineoplastic chemotherapy induced anemia 08/27/2015   Breast cancer of lower-inner quadrant of left female breast ([place]) 07/03/2015   Left carotid artery stenosis    Psoriasis    Hormone replacement therapy    Smoker    Episcleritis     Past Medical History:  Diagnosis Date   Allergy    Anxiety    mild no regular meds   Arthritis    psoriatic arthritis -back   Breast cancer ([place]) 06/25/15 bx  left breastinvasive ductal ca ,grade 1   Episcleritis    Hormone replacement therapy    Hypertension    Left carotid artery stenosis    40-59% (01/2015)   Personal history of chemotherapy 2017   neoadjuvant   Personal history of radiation therapy 2018   Psoriasis    SIADH (syndrome of inappropriate ADH production) ([place])    Smoker     Relevant past medical, surgical, family and social history reviewed and updated as indicated. Interim medical history since our last visit reviewed.  Review of Systems Per HPI unless specifically indicated above     Objective:    BP (!) 144/90   Pulse 70   Temp (!) 97.2 F (36.2 C)   Ht 5\' 4"   (1.626 m)   Wt 116 lb 12.8 oz (53 kg)   SpO2 99%   BMI 20.05 kg/m   Wt Readings from Last 3 Encounters:  11/26/20 116 lb 12.8 oz (53 kg)  06/17/20 114 lb (51.7 kg)  03/11/20 116 lb 6.4 oz (52.8 kg)    Physical Exam Vitals and nursing note reviewed.  Constitutional:      General: She is not in acute distress.    Appearance: Normal appearance. She is not toxic-appearing.  HENT:     Head: Normocephalic and atraumatic.     Nose: Congestion present. No rhinorrhea.     Mouth/Throat:     Mouth: Mucous membranes are moist.     Pharynx: Oropharynx is clear.  Eyes:     General: Vision grossly intact. No scleral icterus.       Right eye: No foreign body or discharge.        Left eye: Discharge and hordeolum present.No foreign body.     Extraocular Movements: Extraocular movements intact.     Right eye: Normal extraocular motion and no nystagmus.     Left eye: Normal extraocular motion and no nystagmus.     Conjunctiva/sclera:     Right eye: Right conjunctiva is not injected. No chemosis, exudate or hemorrhage.    Left eye: Left conjunctiva is not injected. Exudate present.     Pupils: Pupils are equal, round, and reactive to light.     Funduscopic exam:    Right eye: Red reflex present.        Left eye: Red reflex present.    Comments: Small amount clear exudate appreciated; supraorbital edema appreciated without redness, warmth or tenderness  Skin:    General: Skin is warm and dry.     Coloration: Skin is not jaundiced or pale.     Findings: No erythema.  Neurological:     Mental Status: She is alert and oriented to person, place, and time.  Psychiatric:        Mood and Affect: Mood normal.        Behavior: Behavior normal.        Thought Content: Thought content normal.        Judgment: Judgment normal.      Assessment & Plan:  1. Acute viral conjunctivitis of left eye Acute.  Fluorescein stain of eye does not show corneal abrasion.  There is some swelling to lower lid and  there may be a hordeolum developing.  Encouraged continued use of OTC eye drops, warm compresses.  Do not think she has pink eye at this time.  Follow up plan: Return if symptoms worsen or fail to improve.

## 2020-12-02 ENCOUNTER — Encounter: Payer: Self-pay | Admitting: Family Medicine

## 2020-12-02 ENCOUNTER — Other Ambulatory Visit: Payer: Self-pay | Admitting: Family Medicine

## 2020-12-02 ENCOUNTER — Other Ambulatory Visit: Payer: Self-pay | Admitting: Nurse Practitioner

## 2020-12-02 DIAGNOSIS — M545 Low back pain, unspecified: Secondary | ICD-10-CM

## 2020-12-02 DIAGNOSIS — G8929 Other chronic pain: Secondary | ICD-10-CM

## 2020-12-02 MED ORDER — HYDROCODONE-ACETAMINOPHEN 5-325 MG PO TABS: 1.0000 | ORAL_TABLET | Freq: Four times a day (QID) | ORAL | 0 refills | Status: DC | PRN

## 2020-12-02 NOTE — Telephone Encounter (Signed)
Spoke with pt and she states her back pain has increased significantly in the past few weeks. Pt had requested refill of Norco today, however per pharmacy pt picked up #30 11/26/20. Pt states she has been taking this 4 times a day every day due to the pain. Advised pt she needs to start alternating with Tylenol, Advil or Aleve and try to cut down on the Norco and only take as needed. Pt states she will try but the pain isn't being controlled well. I mentioned possible orthopedic referral and pt states she has had steroid injection in her back previously and these did help. Pt states she cannot remember the name of her ortho provider. Advised pt I would check her chart to see if I could locate her previous ortho provider, nothing located. Pt agreeable to referral.   Please advise, thanks!

## 2020-12-02 NOTE — Telephone Encounter (Signed)
When would pt be able to get refill? Thanks!

## 2020-12-22 ENCOUNTER — Other Ambulatory Visit: Payer: Self-pay | Admitting: Family Medicine

## 2020-12-22 MED ORDER — HYDROCODONE-ACETAMINOPHEN 5-325 MG PO TABS: 1.0000 | ORAL_TABLET | Freq: Four times a day (QID) | ORAL | 0 refills | Status: DC | PRN

## 2020-12-31 ENCOUNTER — Ambulatory Visit (INDEPENDENT_AMBULATORY_CARE_PROVIDER_SITE_OTHER): Payer: Medicare PPO

## 2020-12-31 ENCOUNTER — Encounter: Payer: Self-pay | Admitting: Orthopaedic Surgery

## 2020-12-31 ENCOUNTER — Other Ambulatory Visit: Payer: Self-pay

## 2020-12-31 ENCOUNTER — Ambulatory Visit: Payer: Medicare PPO | Admitting: Orthopaedic Surgery

## 2020-12-31 VITALS — BP 160/101 | HR 82 | Ht 64.0 in | Wt 115.0 lb

## 2020-12-31 DIAGNOSIS — G8929 Other chronic pain: Secondary | ICD-10-CM | POA: Diagnosis not present

## 2020-12-31 DIAGNOSIS — M545 Low back pain, unspecified: Secondary | ICD-10-CM

## 2020-12-31 NOTE — Progress Notes (Signed)
Office Visit Note   Patient: Kim Ortega           Date of Birth: [birth year]           MRN: [id number] Visit Date: 12/31/2020              Requested by: [name], MD [address] [facility] [place],  [place] [address] PCP: [name], MD   Assessment & Plan: Visit Diagnoses:  1. Chronic bilateral low back pain, unspecified whether sciatica present     Plan: Patient has been through conservative treatment for several months including anti-inflammatories, muscle relaxants occasional hydrocodone walking and stretching home program.  With history of breast cancer would recommend proceeding with MRI scan lumbar spine with significant increase in her back pain and left leg pain.  Office follow-up after scan for review.  Follow-Up Instructions: No follow-ups on file.   Orders:  Orders Placed This Encounter  Procedures   XR Lumbar Spine 2-3 Views   No orders of the defined types were placed in this encounter.     Procedures: No procedures performed   Clinical Data: No additional findings.   Subjective: Chief Complaint  Patient presents with   Lower Back - Pain    HPI 65 year old white female with BMI of 19 seen with low back pain that radiates into her left leg.  She states it sore all the time.  Sometimes activity will set it off.  Her pain has been significantly worse in the last 3 months.  History of breast cancer since 2017.  She is taking Flexeril at night as well as Aleve and has occasionally used hydrocodone.  She has some chemotherapy-induced peripheral neuropathy.  She is a smoker.  Patient gets some improvement with supine position.  Additionally patient has history of carotid stenosis.  Review of Systems All other systems noncontributory to HPI.  Objective: Vital Signs: BP (!) 160/101    Pulse 82    Ht 5\' 4"  (1.626 m)    Wt 115 lb (52.2 kg)    BMI 19.74 kg/m   Physical Exam Constitutional:      Appearance: She is well-developed.  HENT:      Head: Normocephalic.     Right Ear: External ear normal.     Left Ear: External ear normal. There is no impacted cerumen.  Eyes:     Pupils: Pupils are equal, round, and reactive to light.  Neck:     Thyroid: No thyromegaly.     Trachea: No tracheal deviation.  Cardiovascular:     Rate and Rhythm: Normal rate.  Pulmonary:     Effort: Pulmonary effort is normal.  Abdominal:     Palpations: Abdomen is soft.  Musculoskeletal:     Cervical back: No rigidity.  Skin:    General: Skin is warm and dry.  Neurological:     Mental Status: She is alert and oriented to person, place, and time.  Psychiatric:        Behavior: Behavior normal.    Ortho Exam patient has some tenderness lumbosacral junction and tenderness over the sacrum mild sciatic notch tenderness on the left negative on the right no trochanteric bursal tenderness negative logroll hips good internal and external rotation of both hips.  Knees reach full extension negative up to compression test.  She is able to heel and toe walk.  Specialty Comments:  No specialty comments available.  Imaging: No results found.   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   Antineoplastic chemotherapy induced anemia 08/27/2015   Breast cancer of lower-inner quadrant of left female breast ([place]) 07/03/2015   Left carotid artery stenosis    Psoriasis    Hormone replacement therapy    Smoker    Episcleritis    Past Medical History:  Diagnosis Date   Allergy    Anxiety    mild no regular meds   Arthritis    psoriatic arthritis -back   Breast cancer ([place]) 06/25/15 bx   left breastinvasive ductal ca ,grade 1   Episcleritis    Hormone replacement therapy    Hypertension    Left carotid artery stenosis    40-59% (01/2015)   Personal history of chemotherapy 2017    neoadjuvant   Personal history of radiation therapy 2018   Psoriasis    SIADH (syndrome of inappropriate ADH production) ([place])    Smoker     Family History  Problem Relation Age of Onset   Stroke Mother     Past Surgical History:  Procedure Laterality Date   ABDOMINAL HYSTERECTOMY     BREAST BIOPSY Left 06/2015   BREAST LUMPECTOMY Left 01/2016   BREAST LUMPECTOMY WITH RADIOACTIVE SEED AND SENTINEL LYMPH NODE BIOPSY Left 01/08/2016   Procedure: LEFT BREAST LUMPECTOMY WITH RADIOACTIVE SEED AND SENTINEL LYMPH NODE BIOPSY, BLUE DYE INJECTION;  Surgeon: Rolm Bookbinder, MD;  Location: [facility];  Service: General;  Laterality: Left;   cervical acdf     C5-6- fusion with plates/screws "Dr. Sherwood Gambler" '12   COLONOSCOPY WITH PROPOFOL N/A 04/16/2014   Procedure: COLONOSCOPY WITH PROPOFOL;  Surgeon: Garlan Fair, MD;  [facility];  Service: Endoscopy;  Laterality: N/A;   COLPOSCOPY     laporotomy     Ovarian cyst   PORT-A-CATH REMOVAL Right 01/08/2016   Procedure: REMOVAL PORT-A-CATH;  Surgeon: Rolm Bookbinder, MD;  Location: [facility];  Service: General;  Laterality: Right;   PORTACATH PLACEMENT N/A 07/24/2015   Procedure: INSERTION PORT-A-CATH WITH [place];  Surgeon: Rolm Bookbinder, MD;  Location: [facility];  Service: General;  Laterality: N/A;   ROBOTIC ASSISTED SALPINGO OOPHERECTOMY Bilateral 10/10/2018   Procedure: XI ROBOTIC ASSISTED SALPINGO OOPHORECTOMY;  Surgeon: Everitt Amber, MD;  Location: [facility];  Service: Gynecology;  Laterality: Bilateral;   TONSILLECTOMY     child   Social History   Occupational History   Not on file  Tobacco Use   Smoking status: Every Day    Packs/day: 0.50    Years: 20.00    Pack years: 10.00    Types: Cigarettes   Smokeless tobacco: Never  Substance and Sexual Activity   Alcohol use: Yes    Alcohol/week: 10.0 standard drinks    Types: 10 Glasses of wine per week    Comment: Glasses of wine several times a  week   Drug use: No   Sexual activity: Yes    Birth control/protection: Surgical    Comment: married

## 2021-01-02 ENCOUNTER — Other Ambulatory Visit: Payer: Medicare PPO

## 2021-01-12 ENCOUNTER — Ambulatory Visit
Admission: RE | Admit: 2021-01-12 | Discharge: 2021-01-12 | Disposition: A | Discharge status: Home / Self Care | Payer: Medicare PPO | Source: Ambulatory Visit | Attending: Orthopaedic Surgery | Admitting: Orthopaedic Surgery

## 2021-01-12 DIAGNOSIS — M5126 Other intervertebral disc displacement, lumbar region: Secondary | ICD-10-CM | POA: Diagnosis not present

## 2021-01-12 DIAGNOSIS — M545 Low back pain, unspecified: Secondary | ICD-10-CM

## 2021-01-12 DIAGNOSIS — G8929 Other chronic pain: Secondary | ICD-10-CM

## 2021-01-20 ENCOUNTER — Other Ambulatory Visit: Payer: Self-pay

## 2021-01-20 ENCOUNTER — Ambulatory Visit: Payer: Medicare PPO | Admitting: Orthopaedic Surgery

## 2021-01-20 DIAGNOSIS — M48061 Spinal stenosis, lumbar region without neurogenic claudication: Secondary | ICD-10-CM | POA: Diagnosis not present

## 2021-01-20 NOTE — Progress Notes (Signed)
Office Visit Note   Patient: Kim Ortega           Date of Birth: 1955/07/29           MRN: [id number] Visit Date: 01/20/2021              Requested by: [name], MD [address] [facility] [address] [facility] [place],  [place] [address] PCP: [name], MD   Assessment & Plan: Visit Diagnoses:  1. Foraminal stenosis of lumbar region     Plan: MRI scan shows small amount of fluid around both kidneys.  No hydronephrosis.  She can discuss this with her PCP I am unsure if this is related.  Previous chemotherapy.  No urinary frequency or dysuria at this point.  Reviewed MRI no areas of significant compression.  She does have some moderate foraminal stenosis.  Past history of epidurals x3 in the past she will call if she like to repeat the epidural which past years done by Dr. Jola Baptist had given her relief.  She will continue using ice intermittently.  Follow-Up Instructions: No follow-ups on file.   Orders:  No orders of the defined types were placed in this encounter.  No orders of the defined types were placed in this encounter.     Procedures: No procedures performed   Clinical Data: No additional findings.   Subjective: No chief complaint on file.   HPI 66 year old female returns with ongoing problems with chronic back pain.  She has been treated with anti-inflammatories, muscle relaxants walking and stretching program occasional hydrocodone.  History of breast cancer.  MRI scan has been obtained and is available for review.  Pain radiates more into her left leg with some constant soreness.  Occasionally activities flare her symptoms.  Review of Systems all other systems updated unchanged from 1228/22.   Objective: Vital Signs: BP (!) 171/98    Pulse 76    Ht 5\' 4"  (1.626 m)    Wt 115 lb (52.2 kg)    BMI 19.74 kg/m   Physical Exam Constitutional:      Appearance: She is well-developed.  HENT:     Head: Normocephalic.     Right Ear: External ear normal.      Left Ear: External ear normal. There is no impacted cerumen.  Eyes:     Pupils: Pupils are equal, round, and reactive to light.  Neck:     Thyroid: No thyromegaly.     Trachea: No tracheal deviation.  Cardiovascular:     Rate and Rhythm: Normal rate.  Pulmonary:     Effort: Pulmonary effort is normal.  Abdominal:     Palpations: Abdomen is soft.  Musculoskeletal:     Cervical back: No rigidity.  Skin:    General: Skin is warm and dry.  Neurological:     Mental Status: She is alert and oriented to person, place, and time.  Psychiatric:        Behavior: Behavior normal.    Ortho Exam patient has no isolated motor weakness.  She is able heel toe walk.  No pain with internal or external rotation of her hips.  Specialty Comments:  No specialty comments available.  Imaging: Narrative & Impression  CLINICAL DATA:  66 year old female with low back pain. History of breast cancer. Increased pain for 3 months.   EXAM: MRI LUMBAR SPINE WITHOUT CONTRAST   TECHNIQUE: Multiplanar, multisequence MR imaging of the lumbar spine was performed. No intravenous contrast was administered.  COMPARISON:  Lumbar MRI 07/11/2017.  Lumbar radiographs 12/11/2020.   FINDINGS: Segmentation: Normal on the radiographs, the same numbering system used on the 2019 MRI.   Alignment: Stable lumbar lordosis. No spondylolisthesis. No scoliosis.   Vertebrae: Visualized bone marrow signal is within normal limits. No marrow edema or evidence of acute osseous abnormality. Intact visible sacrum and SI joints.   Conus medullaris and cauda equina: Conus extends to the T12-L1 level. No lower spinal cord or conus signal abnormality. Fairly capacious spinal canal. Cauda equina nerve roots appear within normal limits.   Paraspinal and other soft tissues: New since 2019 trace bilateral pararenal space fluid (series 6, images 6 and 14. But no convincing hydronephrosis. No hydroureter. Otherwise negative  visible abdominal viscera. Paraspinal soft tissues are within normal limits.   Disc levels:   T11-T12: Disc desiccation and mild disc space loss but otherwise negative.   T12-L1:  Negative.   L1-L2:  Negative.   L2-L3: Chronic disc desiccation, disc space loss and mild circumferential disc bulge. No spinal or lateral recess stenosis. Mild left and moderate right L2 foraminal stenosis appears stable since 2019.   L3-L4: Chronic disc desiccation and disc space loss with circumferential disc bulge and endplate spurring. No spinal or lateral recess stenosis. Mild to moderate left and mild right L3 foraminal stenosis appears stable.   L4-L5: Chronic disc desiccation and disc space loss with right eccentric circumferential disc bulge. Mild endplate and facet hypertrophy. No spinal or lateral recess stenosis. Stable mild left but increased mild to moderate right L4 neural foraminal stenosis (series 4 image 3) which appears multifactorial.   L5-S1:  Negative disc.  Minimal facet hypertrophy.  No stenosis.   IMPRESSION: 1. Mild for age lumbar spine degeneration, generally stable since 2019. No spinal or lateral recess stenosis. But moderate neural foraminal stenosis at the right L4 nerve level appears increased. Stable mild to moderate foraminal stenosis elsewhere.   2. Trace bilateral pararenal space fluid isNew since 2019 and suggests a nonspecific renal inflammation. There is no evidence of hydronephrosis, obstructive nephropathy.     Electronically Signed   By: Genevie Idalis M.D.   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   Antineoplastic chemotherapy induced anemia 08/27/2015   Breast cancer of lower-inner quadrant of left female  breast ([place]) 07/03/2015   Left carotid artery stenosis    Psoriasis    Hormone replacement therapy    Smoker    Episcleritis    Past Medical History:  Diagnosis Date   Allergy    Anxiety    mild no regular meds   Arthritis    psoriatic arthritis -back   Breast cancer ([place]) 06/25/15 bx   left breastinvasive ductal ca ,grade 1   Episcleritis    Hormone replacement therapy    Hypertension    Left carotid artery stenosis    40-59% (01/2015)   Personal history of chemotherapy 2017   neoadjuvant   Personal history of radiation therapy 2018   Psoriasis    SIADH (syndrome of inappropriate ADH production) ([place])    Smoker     Family History  Problem Relation Age of Onset   Stroke Mother     Past Surgical History:  Procedure Laterality Date  ABDOMINAL HYSTERECTOMY     BREAST BIOPSY Left 06/2015   BREAST LUMPECTOMY Left 01/2016   BREAST LUMPECTOMY WITH RADIOACTIVE SEED AND SENTINEL LYMPH NODE BIOPSY Left 01/08/2016   Procedure: LEFT BREAST LUMPECTOMY WITH RADIOACTIVE SEED AND SENTINEL LYMPH NODE BIOPSY, BLUE DYE INJECTION;  Surgeon: Rolm Bookbinder, MD;  Location: [facility];  Service: General;  Laterality: Left;   cervical acdf     C5-6- fusion with plates/screws "Dr. Sherwood Gambler" '12   COLONOSCOPY WITH PROPOFOL N/A 04/16/2014   Procedure: COLONOSCOPY WITH PROPOFOL;  Surgeon: Garlan Fair, MD;  [facility];  Service: Endoscopy;  Laterality: N/A;   COLPOSCOPY     laporotomy     Ovarian cyst   PORT-A-CATH REMOVAL Right 01/08/2016   Procedure: REMOVAL PORT-A-CATH;  Surgeon: Rolm Bookbinder, MD;  Location: [facility];  Service: General;  Laterality: Right;   PORTACATH PLACEMENT N/A 07/24/2015   Procedure: INSERTION PORT-A-CATH WITH [place];  Surgeon: Rolm Bookbinder, MD;  Location: [facility];  Service: General;  Laterality: N/A;   ROBOTIC ASSISTED SALPINGO OOPHERECTOMY Bilateral 10/10/2018   Procedure: XI ROBOTIC ASSISTED SALPINGO OOPHORECTOMY;   Surgeon: Everitt Amber, MD;  [facility];  Service: Gynecology;  Laterality: Bilateral;   TONSILLECTOMY     child   Social History   Occupational History   Not on file  Tobacco Use   Smoking status: Every Day    Packs/day: 0.50    Years: 20.00    Pack years: 10.00    Types: Cigarettes   Smokeless tobacco: Never  Substance and Sexual Activity   Alcohol use: Yes    Alcohol/week: 10.0 standard drinks    Types: 10 Glasses of wine per week    Comment: Glasses of wine several times a week   Drug use: No   Sexual activity: Yes    Birth control/protection: Surgical    Comment: married

## 2021-01-21 DIAGNOSIS — M48061 Spinal stenosis, lumbar region without neurogenic claudication: Secondary | ICD-10-CM | POA: Insufficient documentation

## 2021-01-22 ENCOUNTER — Other Ambulatory Visit: Payer: Self-pay | Admitting: Family Medicine

## 2021-01-22 MED ORDER — HYDROCODONE-ACETAMINOPHEN 5-325 MG PO TABS: 1.0000 | ORAL_TABLET | Freq: Four times a day (QID) | ORAL | 0 refills | Status: DC | PRN

## 2021-01-22 NOTE — Telephone Encounter (Signed)
Hydrocodone refill request.  Last seen 11/26/2020, last filled 12/22/2020.

## 2021-01-23 ENCOUNTER — Other Ambulatory Visit: Payer: Self-pay

## 2021-01-23 MED ORDER — ATENOLOL 50 MG PO TABS: 50.0000 mg | ORAL_TABLET | Freq: Every day | ORAL | 3 refills | Status: DC

## 2021-01-25 ENCOUNTER — Other Ambulatory Visit: Payer: Self-pay | Admitting: Family Medicine

## 2021-01-26 NOTE — Telephone Encounter (Signed)
Flexeril refill request.  Last seen 11/26/2020, last filled 10/27/2020.

## 2021-02-10 ENCOUNTER — Telehealth: Payer: Self-pay | Admitting: Orthopaedic Surgery

## 2021-02-10 DIAGNOSIS — M48061 Spinal stenosis, lumbar region without neurogenic claudication: Secondary | ICD-10-CM

## 2021-02-10 NOTE — Telephone Encounter (Signed)
Patient called. She is still having pain. Would like a referral to see Dr. Ernestina Patches for an injection. Her call back number is [phone]

## 2021-02-10 NOTE — Telephone Encounter (Signed)
I called patient and advised. 

## 2021-02-10 NOTE — Telephone Encounter (Signed)
Referral entered  

## 2021-02-23 ENCOUNTER — Other Ambulatory Visit: Payer: Self-pay | Admitting: Family Medicine

## 2021-02-23 ENCOUNTER — Other Ambulatory Visit: Payer: Self-pay | Admitting: Hematology and Oncology

## 2021-02-24 ENCOUNTER — Other Ambulatory Visit: Payer: Self-pay | Admitting: Family Medicine

## 2021-02-25 ENCOUNTER — Other Ambulatory Visit: Payer: Self-pay

## 2021-02-25 NOTE — Telephone Encounter (Signed)
LOV 11/26/20 Last refill 01/22/21, #30, 0 refills  Per patient she is scheduled for epidural injections w/ortho.   Please review, thanks!

## 2021-02-26 ENCOUNTER — Ambulatory Visit: Payer: Medicare PPO | Admitting: Physical Medicine and Rehabilitation

## 2021-02-26 ENCOUNTER — Other Ambulatory Visit: Payer: Self-pay | Admitting: Family Medicine

## 2021-02-26 ENCOUNTER — Telehealth: Payer: Self-pay | Admitting: Family Medicine

## 2021-02-26 MED ORDER — HYDROCODONE-ACETAMINOPHEN 5-325 MG PO TABS: 1.0000 | ORAL_TABLET | Freq: Four times a day (QID) | ORAL | 0 refills | Status: DC | PRN

## 2021-02-26 MED ORDER — HYDROCODONE-ACETAMINOPHEN 7.5-325 MG PO TABS: 1.0000 | ORAL_TABLET | Freq: Four times a day (QID) | ORAL | 0 refills | Status: DC | PRN

## 2021-02-26 NOTE — Telephone Encounter (Signed)
Pharmacy is out of stock of [place] with no known supply date. Patient is requesting next higher dose and states she will cut pills in half.  Please advise, thanks!

## 2021-02-26 NOTE — Telephone Encounter (Signed)
Rx already sent to pharmacy.

## 2021-02-26 NOTE — Telephone Encounter (Signed)
Received call from patient to follow up on refill adjustment request from pharmacy for HYDROcodone-acetaminophen [facility]) 5-325 MG tablet [[id number]] ;  states pharmacy completely out of stock and will be for a while.   Pharmacy advised patient to follow up on their request for provider to adjust Rx to 10mg ; patient will cut pills in half.   Please advise at [phone]

## 2021-03-06 ENCOUNTER — Telehealth: Payer: Self-pay | Admitting: Hematology and Oncology

## 2021-03-06 NOTE — Telephone Encounter (Signed)
Rescheduled appointment per provider. Patient is aware of the changes made to her appointment. ?

## 2021-03-11 ENCOUNTER — Inpatient Hospital Stay: Payer: Medicare PPO | Admitting: Hematology and Oncology

## 2021-03-23 ENCOUNTER — Other Ambulatory Visit: Payer: Self-pay | Admitting: Family Medicine

## 2021-03-24 ENCOUNTER — Other Ambulatory Visit: Payer: Self-pay

## 2021-03-24 ENCOUNTER — Inpatient Hospital Stay: Payer: Medicare PPO | Attending: Hematology and Oncology | Admitting: Hematology and Oncology

## 2021-03-24 DIAGNOSIS — Z7982 Long term (current) use of aspirin: Secondary | ICD-10-CM | POA: Insufficient documentation

## 2021-03-24 DIAGNOSIS — Z9221 Personal history of antineoplastic chemotherapy: Secondary | ICD-10-CM | POA: Insufficient documentation

## 2021-03-24 DIAGNOSIS — Z791 Long term (current) use of non-steroidal anti-inflammatories (NSAID): Secondary | ICD-10-CM | POA: Diagnosis not present

## 2021-03-24 DIAGNOSIS — Z923 Personal history of irradiation: Secondary | ICD-10-CM | POA: Diagnosis not present

## 2021-03-24 DIAGNOSIS — L409 Psoriasis, unspecified: Secondary | ICD-10-CM | POA: Insufficient documentation

## 2021-03-24 DIAGNOSIS — Z170 Estrogen receptor positive status [ER+]: Secondary | ICD-10-CM | POA: Diagnosis not present

## 2021-03-24 DIAGNOSIS — Z79899 Other long term (current) drug therapy: Secondary | ICD-10-CM | POA: Diagnosis not present

## 2021-03-24 DIAGNOSIS — C50312 Malignant neoplasm of lower-inner quadrant of left female breast: Secondary | ICD-10-CM | POA: Insufficient documentation

## 2021-03-24 NOTE — Progress Notes (Signed)
? ?Patient Care Team: ?[name], MD as PCP - General (Family Medicine) ?[name], MD as Consulting Physician (Hematology and Oncology) ?Gardenia Phlegm, NP as Nurse Practitioner (Hematology and Oncology) ?Kyung Rudd, MD as Consulting Physician (Radiation Oncology) ?Rolm Bookbinder, MD as Consulting Physician (General Surgery) ? ?DIAGNOSIS:  ?Encounter Diagnosis  ?Name Primary?  ? Malignant neoplasm of lower-inner quadrant of left breast in female, estrogen receptor positive ([name])   ? ? ?SUMMARY OF ONCOLOGIC HISTORY: ?Oncology History  ?Breast cancer of lower-inner quadrant of left female breast ([name])  ?06/25/2015 Initial Diagnosis  ? Left breast biopsy 8:30 position: IDC grade 1, ER 90%, PR 0%, 50%, HER-2 Negative Ratio 1.21, Irregular Mass Left Breast 1 Cm from Nipple 2.5 x 2 x 2.5 Cm with Distortion, T2 N0 Stage II a Clinical Stage, Oncotype DX score 52, 34% ROR ?  ?07/30/2015 - 12/16/2015 Neo-Adjuvant Chemotherapy  ? Neoadjuvant chemotherapy with dose dense Adriamycin and Cytoxan ?4 followed by Abraxane weekly ?12 ?  ?12/18/2015 Breast MRI  ? Excellent interval response to chemotherapy with only minimal residual enhancement, 2 tiny rounded regions along superior margin, unchanged, no abnormal lymph nodes ? ?  ?01/08/2016 Surgery  ? Left Lumpectomy [name]): IDC grade 1, 0.2 cm, Margins Neg, 0/1 LN Neg; ypT1aN0 ?  ?02/10/2016 - 03/23/2016 Radiation Therapy  ? Adjuvant radiation ?  ?04/04/2016 -  Anti-estrogen oral therapy  ? Anastrozole 1 mg daily switched to letrozole 01/04/2017 ?  ? ? ?CHIEF COMPLIANT:  Follow-up of left breast cancer on  exemestane therapy  ? ?INTERVAL HISTORY: Kim Ortega is a 66 y.o. with above-mentioned history of left breast cancer treated with lumpectomy, radiation, and is currently on exemestane. Mammogram on 09/03/19 showed no evidence of malignancy bilaterally. She presents to the clinic today for follow-up. She states that everything has been gong fine. She  complained of stiff joints. ? ? ?ALLERGIES:  has No Known Allergies. ? ?MEDICATIONS:  ?Current Outpatient Medications  ?Medication Sig Dispense Refill  ? ALPRAZolam (XANAX) 0.5 MG tablet Take 0.5 mg by mouth 3 (three) times daily as needed for anxiety.    ? aspirin EC 81 MG tablet Take 81 mg by mouth daily.    ? atenolol (TENORMIN) 50 MG tablet Take 1 tablet (50 mg total) by mouth daily. 90 tablet 3  ? atorvastatin (LIPITOR) 40 MG tablet Take 1 tablet (40 mg total) by mouth daily. 90 tablet 3  ? Calcium Carbonate-Vitamin D (CALCIUM-D PO) Take 1 tablet by mouth 2 (two) times daily.     ? clobetasol (OLUX) 0.05 % topical foam Apply topically.    ? cyclobenzaprine (FLEXERIL) 10 MG tablet TAKE 1 TABLET BY MOUTH THREE TIMES A DAY AS NEEDED FOR MUSCLE SPASMS 30 tablet 2  ? fluticasone (FLONASE) 50 MCG/ACT nasal spray SPRAY 2 SPRAYS INTO EACH NOSTRIL EVERY DAY 48 mL 2  ? Guselkumab (TREMFYA) 100 MG/ML SOPN Inject into the skin. Every 8 weeks    ? hydrochlorothiazide (HYDRODIURIL) 25 MG tablet TAKE 1 TABLET BY MOUTH EVERY DAY 90 tablet 2  ? HYDROcodone-acetaminophen (NORCO) 7.5-325 MG tablet Take 1 tablet by mouth every 6 (six) hours as needed for moderate pain. 30 tablet 0  ? ibuprofen (ADVIL) 600 MG tablet Take 1 tablet (600 mg total) by mouth every 6 (six) hours as needed for moderate pain. For AFTER surgery only 30 tablet 0  ? Ketotifen Fumarate (ALLERGY EYE DROPS OP) Place 1 drop into both eyes 2 (two) times daily as needed (allergies).     ?  losartan (COZAAR) 50 MG tablet TAKE 1 TABLET BY MOUTH EVERY DAY 90 tablet 1  ? meloxicam (MOBIC) 15 MG tablet TAKE 1 TABLET (15 MG TOTAL) BY MOUTH DAILY. 30 tablet 0  ? Multiple Vitamin (MULTIVITAMIN WITH MINERALS) TABS tablet Take 1 tablet by mouth daily.    ? venlafaxine XR (EFFEXOR-XR) 37.5 MG 24 hr capsule TAKE 1 CAPSULE BY MOUTH DAILY WITH BREAKFAST. 90 capsule 2  ? ?No current facility-administered medications for this visit.  ? ? ?PHYSICAL EXAMINATION: ?ECOG PERFORMANCE  STATUS: 1 - Symptomatic but completely ambulatory ? ?Vitals:  ? 03/24/21 1432  ?BP: (!) 158/86  ?Pulse: 86  ?Resp: 18  ?Temp: 97.9 ?F (36.6 ?C)  ?SpO2: 100%  ? ?Filed Weights  ? 03/24/21 1432  ?Weight: 119 lb 4.8 oz (54.1 kg)  ? ? ?BREAST: No palpable masses or nodules in either right or left breasts. No palpable axillary supraclavicular or infraclavicular adenopathy no breast tenderness or nipple discharge. (exam performed in the presence of a chaperone) ? ?LABORATORY DATA:  ?I have reviewed the data as listed ?CMP Latest Ref Rng & Units 06/17/2020 07/26/2019 07/13/2019  ?Glucose 65 - 99 mg/dL 101(H) 113(H) 96  ?BUN 7 - 25 mg/dL 12 12 7   ?Creatinine 0.50 - 0.99 mg/dL 0.73 0.69 0.57  ?Sodium 135 - 146 mmol/L 133(L) 130(L) 123(L)  ?Potassium 3.5 - 5.3 mmol/L 4.5 4.2 4.0  ?Chloride 98 - 110 mmol/L 96(L) 93(L) 86(L)  ?CO2 20 - 32 mmol/L 29 29 28   ?Calcium 8.6 - 10.4 mg/dL 9.6 9.7 9.3  ?Total Protein 6.1 - 8.1 g/dL 7.0 - 6.7  ?Total Bilirubin 0.2 - 1.2 mg/dL 0.5 - 0.8  ?Alkaline Phos 38 - 126 U/L - - -  ?AST 10 - 35 U/L 25 - 20  ?ALT 6 - 29 U/L 19 - 15  ? ? ?Lab Results  ?Component Value Date  ? WBC 8.1 06/17/2020  ? HGB 13.4 06/17/2020  ? HCT 38.6 06/17/2020  ? MCV 100.5 (H) 06/17/2020  ? PLT 310 06/17/2020  ? NEUTROABS 4,925 06/17/2020  ? ? ?ASSESSMENT & PLAN:  ?Breast cancer of lower-inner quadrant of left female breast ([name]) ?Left breast biopsy 8:30 position: IDC grade 1, ER 90%, PR 0%, 50%, HER-2 Negative Ratio 1.21, Irregular Mass Left Breast 1 Cm from Nipple 2.5 x 2 x 2.5 Cm with Distortion,   ?T2 N0 Stage II a Clinical Stage ?Oncotype DX score 52, 34% risk of recurrence without chemotherapy ?  ?Treatment Summary: ?1. Neoadjuvant chemotherapy with dose dense Adriamycin and Cytoxan ?4 followed by Abraxane weekly ?12; started 07/30/2015 completed 12/16/2015  ?2. 1/4/17Left Lumpectomy: IDC grade 1, 0.2 cm, Margins Neg, 0/1 LN Neg; ypT1aN0 ?3. Adjuvant radiation therapy 02/10/16- 03/23/16 ?   ?Current treatment: Adjuvant  anastrozole 1 mg daily ?5-10 years switched to letrozole 11/19/2016 due to musculoskeletal aches and pains, switched to exemestane 03/12/2019 because of hot flashes (tolerating it extremely well) ?   ?Breast cancer surveillance: ?1.  Breast exam 03/24/2021: Benign ?2.  Mammogram: 09/04/2020: No evidence of malignancy, breast density category C ?CT abdomen and MRI abdomen: Cystic left adnexal lesion (instructed her to follow-up with her GYN) ?  ?Psoriasis: Complete response to immunotherapy treatment.  She is very happy about it. ?  ?Return to clinic in 1 year for follow-up ? ? ? ?No orders of the defined types were placed in this encounter. ? ?The patient has a good understanding of the overall plan. she agrees with it. she will call with any  problems that may develop before the next visit here. ?Total time spent: 30 mins including face to face time and time spent for planning, charting and co-ordination of care ? ? Harriette Ohara, MD ?03/24/21 ? ? ? I Gardiner Coins am scribing for Dr. Lindi Adie ? ?I have reviewed the above documentation for accuracy and completeness, and I agree with the above. ?  ?

## 2021-03-24 NOTE — Assessment & Plan Note (Signed)
Left breast biopsy 8:30 position: IDC grade 1, ER 90%, PR 0%, 50%, HER-2 Negative Ratio 1.21, Irregular Mass Left Breast 1 Cm from Nipple 2.5 x 2 x 2.5 Cm with Distortion, ? ?T2 N0 Stage II a Clinical Stage ?Oncotype DX score 52, 34% risk of recurrence without chemotherapy ?? ?Treatment Summary: ?1. Neoadjuvant chemotherapy with dose dense Adriamycin and Cytoxan ?4 followed by Abraxane weekly ?12; started 07/30/2015 completed 12/16/2015  ?2. 1/4/17Left Lumpectomy: IDC grade 1, 0.2 cm, Margins Neg, 0/1 LN Neg; ypT1aN0 ?3. Adjuvant radiation therapy 02/10/16- 03/23/16 ??? ?Current treatment: Adjuvant anastrozole 1 mg daily ?5-10 years?switched to letrozole 11/19/2016 due to musculoskeletal aches and pains, switched to exemestane 03/12/2019 because of hot flashes (tolerating it extremely well) ?? ?? ?Breast cancer surveillance: ?1.??Breast exam?03/24/2021: Benign ?2.??Mammogram: 09/04/2020: No evidence of malignancy, breast density category C ?CT abdomen and MRI abdomen: Cystic left adnexal lesion (instructed her to follow-up with her GYN) ?? ?Psoriasis: Okay for her to receive biologic treatments.  We will call Dr. Maurie Boettcher office and inform them. ?[facility]  We will get a copy of that report. ?? ?Return to clinic in?1 year?for follow-up ?

## 2021-03-29 ENCOUNTER — Other Ambulatory Visit: Payer: Self-pay | Admitting: Family Medicine

## 2021-03-31 MED ORDER — HYDROCODONE-ACETAMINOPHEN 7.5-325 MG PO TABS: 1.0000 | ORAL_TABLET | Freq: Four times a day (QID) | ORAL | 0 refills | Status: DC | PRN

## 2021-03-31 NOTE — Telephone Encounter (Signed)
LOV 11/26/20 ?Last refill 02/26/21, #30, 0 refills ? ?Please review, thanks! ? ?Patient comment: ?Patient comment: doing better but some bad days, thanks  ?

## 2021-04-09 ENCOUNTER — Ambulatory Visit (INDEPENDENT_AMBULATORY_CARE_PROVIDER_SITE_OTHER): Payer: Medicare PPO

## 2021-04-09 VITALS — Ht 64.0 in | Wt 119.0 lb

## 2021-04-09 DIAGNOSIS — Z0000 Encounter for general adult medical examination without abnormal findings: Secondary | ICD-10-CM

## 2021-04-09 NOTE — Patient Instructions (Signed)
Kim Ortega , ?Thank you for taking time to come for your Medicare Wellness Visit. I appreciate your ongoing commitment to your health goals. Please review the following plan we discussed and let me know if I can assist you in the future.  ? ?Screening recommendations/referrals: ?Colonoscopy: Done 04/29/2014 Repeat in 10 years ? ?Mammogram: Done 09/04/2020 Repeat annually ? ?Bone Density: Done 11/02/2016. Repeat every 2 years ? ?Recommended yearly ophthalmology/optometry visit for glaucoma screening and checkup ?Recommended yearly dental visit for hygiene and checkup ? ?Vaccinations: ?Influenza vaccine: Discussed. Repeat annually ? ?Pneumococcal vaccine: Done 10/25/2016. Second dose due at your convenience.  ?Tdap vaccine: Done 07/04/2009 Repeat in 10 years ? ?Shingles vaccine: Discussed.    ?Covid-19:Done 03/27/2019 and 04/24/2019 ? ?Advanced directives: Copy in chart. ? ?Conditions/risks identified: Aim for 30 minutes of exercise or brisk walking, 6-8 glasses of water, and 5 servings of fruits and vegetables each day. ?KEEP UP THE GOOD WORK!! ? ?If you wish to quit smoking, help is available. For free tobacco cessation program offerings call the [facility] at 701-324-1769 or Live Well Line at (905) 135-1220. You may also visit www.[place].com or email [email or web address] for more information on other programs.  ? ?You may also call 1-800-QUIT-NOW 563-281-1813) or visit www.[employer].dk or www.[employer].org for additional resources on smoking cessation.  ? ? ?Next appointment: Follow up in one year for your annual wellness visit 2024. ? ? ?Preventive Care 81 Years and Older, Female ?Preventive care refers to lifestyle choices and visits with your health care provider that can promote health and wellness. ?What does preventive care include? ?A yearly physical exam. This is also called an annual well check. ?Dental exams once or twice a year. ?Routine eye exams. Ask your health care  provider how often you should have your eyes checked. ?Personal lifestyle choices, including: ?Daily care of your teeth and gums. ?Regular physical activity. ?Eating a healthy diet. ?Avoiding tobacco and drug use. ?Limiting alcohol use. ?Practicing safe sex. ?Taking low-dose aspirin every day. ?Taking vitamin and mineral supplements as recommended by your health care provider. ?What happens during an annual well check? ?The services and screenings done by your health care provider during your annual well check will depend on your age, overall health, lifestyle risk factors, and family history of disease. ?Counseling  ?Your health care provider may ask you questions about your: ?Alcohol use. ?Tobacco use. ?Drug use. ?Emotional well-being. ?Home and relationship well-being. ?Sexual activity. ?Eating habits. ?History of falls. ?Memory and ability to understand (cognition). ?Work and work Statistician. ?Reproductive health. ?Screening  ?You may have the following tests or measurements: ?Height, weight, and BMI. ?Blood pressure. ?Lipid and cholesterol levels. These may be checked every 5 years, or more frequently if you are over 60 years old. ?Skin check. ?Lung cancer screening. You may have this screening every year starting at age 59 if you have a 30-pack-year history of smoking and currently smoke or have quit within the past 15 years. ?Fecal occult blood test (FOBT) of the stool. You may have this test every year starting at age 27. ?Flexible sigmoidoscopy or colonoscopy. You may have a sigmoidoscopy every 5 years or a colonoscopy every 10 years starting at age 58. ?Hepatitis C blood test. ?Hepatitis B blood test. ?Sexually transmitted disease (STD) testing. ?Diabetes screening. This is done by checking your blood sugar (glucose) after you have not eaten for a while (fasting). You may have this done every 1-3 years. ?Bone density scan. This is done to screen for  osteoporosis. You may have this done starting at age  68. ?Mammogram. This may be done every 1-2 years. Talk to your health care provider about how often you should have regular mammograms. ?Talk with your health care provider about your test results, treatment options, and if necessary, the need for more tests. ?Vaccines  ?Your health care provider may recommend certain vaccines, such as: ?Influenza vaccine. This is recommended every year. ?Tetanus, diphtheria, and acellular pertussis (Tdap, Td) vaccine. You may need a Td booster every 10 years. ?Zoster vaccine. You may need this after age 60. ?Pneumococcal 13-valent conjugate (PCV13) vaccine. One dose is recommended after age 48. ?Pneumococcal polysaccharide (PPSV23) vaccine. One dose is recommended after age 20. ?Talk to your health care provider about which screenings and vaccines you need and how often you need them. ?This information is not intended to replace advice given to you by your health care provider. Make sure you discuss any questions you have with your health care provider. ?Document Released: 01/17/2015 Document Revised: 09/10/2015 Document Reviewed: 10/22/2014 ?Elsevier Interactive Patient Education ? 2017 [place]. ? ?Fall Prevention in the Home ?Falls can cause injuries. They can happen to people of all ages. There are many things you can do to make your home safe and to help prevent falls. ?What can I do on the outside of my home? ?Regularly fix the edges of walkways and driveways and fix any cracks. ?Remove anything that might make you trip as you walk through a door, such as a raised step or threshold. ?Trim any bushes or trees on the path to your home. ?Use bright outdoor lighting. ?Clear any walking paths of anything that might make someone trip, such as rocks or tools. ?Regularly check to see if handrails are loose or broken. Make sure that both sides of any steps have handrails. ?Any raised decks and porches should have guardrails on the edges. ?Have any leaves, snow, or ice cleared  regularly. ?Use sand or salt on walking paths during winter. ?Clean up any spills in your garage right away. This includes oil or grease spills. ?What can I do in the bathroom? ?Use night lights. ?Install grab bars by the toilet and in the tub and shower. Do not use towel bars as grab bars. ?Use non-skid mats or decals in the tub or shower. ?If you need to sit down in the shower, use a plastic, non-slip stool. ?Keep the floor dry. Clean up any water that spills on the floor as soon as it happens. ?Remove soap buildup in the tub or shower regularly. ?Attach bath mats securely with double-sided non-slip rug tape. ?Do not have throw rugs and other things on the floor that can make you trip. ?What can I do in the bedroom? ?Use night lights. ?Make sure that you have a light by your bed that is easy to reach. ?Do not use any sheets or blankets that are too big for your bed. They should not hang down onto the floor. ?Have a firm chair that has side arms. You can use this for support while you get dressed. ?Do not have throw rugs and other things on the floor that can make you trip. ?What can I do in the kitchen? ?Clean up any spills right away. ?Avoid walking on wet floors. ?Keep items that you use a lot in easy-to-reach places. ?If you need to reach something above you, use a strong step stool that has a grab bar. ?Keep electrical cords out of the way. ?Do not  use floor polish or wax that makes floors slippery. If you must use wax, use non-skid floor wax. ?Do not have throw rugs and other things on the floor that can make you trip. ?What can I do with my stairs? ?Do not leave any items on the stairs. ?Make sure that there are handrails on both sides of the stairs and use them. Fix handrails that are broken or loose. Make sure that handrails are as long as the stairways. ?Check any carpeting to make sure that it is firmly attached to the stairs. Fix any carpet that is loose or worn. ?Avoid having throw rugs at the top or  bottom of the stairs. If you do have throw rugs, attach them to the floor with carpet tape. ?Make sure that you have a light switch at the top of the stairs and the bottom of the stairs. If you do not have them, ask

## 2021-04-09 NOTE — Progress Notes (Signed)
? ?Subjective:  ? Kim Ortega [address] Kim Ortega is a 66 y.o. female who presents for an Initial Medicare Annual Wellness Visit. ?Virtual Visit via Telephone Note ? ?I connected with  Kim Ortega on 04/09/21 at  8:15 AM EDT by telephone and verified that I am speaking with the correct person using two identifiers. ? ?Location: ?Patient: HOME ?Provider: [facility] ?Persons participating in the virtual visit: patient/Nurse Health Advisor ?  ?I discussed the limitations, risks, security and privacy concerns of performing an evaluation and management service by telephone and the availability of in person appointments. The patient expressed understanding and agreed to proceed. ? ?Interactive audio and video telecommunications were attempted between this nurse and patient, however failed, due to patient having technical difficulties OR patient did not have access to video capability.  We continued and completed visit with audio only. ? ?Some vital signs may be absent or patient reported.  ? ?Chriss Driver, LPN ? ?Review of Systems    ? ?Cardiac Risk Factors include: advanced age (>8mn, >>11women);smoking/ tobacco exposure;sedentary lifestyle, Risk factor comments: Neuropathy, Bulging disks in back. ? ?   ?Objective:  ?  ?Today's Vitals  ? 04/09/21 0191404/06/23 [id number]  ?Weight: 119 lb (54 kg)   ?Height: '5\' 4"'$  (1.626 m)   ?PainSc:  5   ? ?Body mass index is 20.43 kg/m?. ? ? ?  04/09/2021  ?  8:29 AM 10/10/2018  ?  6:37 AM 10/06/2018  ?  9:38 AM 09/29/2018  ? 10:18 AM 05/20/2016  ? 12:49 PM 04/27/2016  ?  8:24 AM 02/03/2016  ?  2:42 PM  ?Advanced Directives  ?Does Patient Have a Medical Advance Directive? Yes Yes Yes Yes No Yes Yes  ?Type of AParamedicof ADenverLiving will HDixon Lane-Mead[place]Living will HH[place]Living will   [place]Living will [place]Living will  ?Does patient want to make changes to medical advance directive?  No -  Patient declined No - Patient declined      ?Copy of [place] Chart? Yes - validated most recent copy scanned in chart (See row information) Yes - validated most recent copy scanned in chart (See row information) Yes - validated most recent copy scanned in chart (See row information)      ?Would patient like information on creating a medical advance directive?  No - Patient declined       ? ? ?Current Medications (verified) ?Outpatient Encounter Medications as of 04/09/2021  ?Medication Sig  ? ALPRAZolam (XANAX) 0.5 MG tablet Take 0.5 mg by mouth 3 (three) times daily as needed for anxiety.  ? aspirin EC 81 MG tablet Take 81 mg by mouth daily.  ? atenolol (TENORMIN) 50 MG tablet Take 1 tablet (50 mg total) by mouth daily.  ? atorvastatin (LIPITOR) 40 MG tablet Take 1 tablet (40 mg total) by mouth daily.  ? Calcium Carbonate-Vitamin D (CALCIUM-D PO) Take 1 tablet by mouth 2 (two) times daily.   ? clobetasol (OLUX) 0.05 % topical foam Apply topically.  ? cyclobenzaprine (FLEXERIL) 10 MG tablet TAKE 1 TABLET BY MOUTH THREE TIMES A DAY AS NEEDED FOR MUSCLE SPASMS  ? fluticasone (FLONASE) 50 MCG/ACT nasal spray SPRAY 2 SPRAYS INTO EACH NOSTRIL EVERY DAY  ? Guselkumab (TREMFYA) 100 MG/ML SOPN Inject into the skin. Every 8 weeks  ? hydrochlorothiazide (HYDRODIURIL) 25 MG tablet TAKE 1 TABLET BY MOUTH EVERY DAY  ? HYDROcodone-acetaminophen (NORCO) 7.5-325 MG  tablet Take 1 tablet by mouth every 6 (six) hours as needed for moderate pain.  ? ibuprofen (ADVIL) 600 MG tablet Take 1 tablet (600 mg total) by mouth every 6 (six) hours as needed for moderate pain. For AFTER surgery only  ? Ketotifen Fumarate (ALLERGY EYE DROPS OP) Place 1 drop into both eyes 2 (two) times daily as needed (allergies).   ? losartan (COZAAR) 50 MG tablet TAKE 1 TABLET BY MOUTH EVERY DAY  ? meloxicam (MOBIC) 15 MG tablet TAKE 1 TABLET (15 MG TOTAL) BY MOUTH DAILY.  ? Multiple Vitamin (MULTIVITAMIN WITH MINERALS) TABS tablet Take 1  tablet by mouth daily.  ? venlafaxine XR (EFFEXOR-XR) 37.5 MG 24 hr capsule TAKE 1 CAPSULE BY MOUTH DAILY WITH BREAKFAST.  ? ?No facility-administered encounter medications on file as of 04/09/2021.  ? ? ?Allergies (verified) ?Patient has no known allergies.  ? ?History: ?Past Medical History:  ?Diagnosis Date  ? Allergy   ? Anxiety   ? mild no regular meds  ? Arthritis   ? psoriatic arthritis -back  ? Breast cancer ([place]) 06/25/15 bx  ? left breastinvasive ductal ca ,grade 1  ? Episcleritis   ? Hormone replacement therapy   ? Hypertension   ? Left carotid artery stenosis   ? 40-59% (01/2015)  ? Personal history of chemotherapy 2017  ? neoadjuvant  ? Personal history of radiation therapy 2018  ? Psoriasis   ? SIADH (syndrome of inappropriate ADH production) ([place])   ? Smoker   ? ?Past Surgical History:  ?Procedure Laterality Date  ? ABDOMINAL HYSTERECTOMY    ? BREAST BIOPSY Left 06/2015  ? BREAST LUMPECTOMY Left 01/2016  ? BREAST LUMPECTOMY WITH RADIOACTIVE SEED AND SENTINEL LYMPH NODE BIOPSY Left 01/08/2016  ? Procedure: LEFT BREAST LUMPECTOMY WITH RADIOACTIVE SEED AND SENTINEL LYMPH NODE BIOPSY, BLUE DYE INJECTION;  Surgeon: Rolm Bookbinder, MD;  Location: [facility];  Service: General;  Laterality: Left;  ? cervical acdf    ? C5-6- fusion with plates/screws "Dr. Sherwood Gambler" '12  ? COLONOSCOPY WITH PROPOFOL N/A 04/16/2014  ? Procedure: COLONOSCOPY WITH PROPOFOL;  Surgeon: Garlan Fair, MD;  Location: [facility];  Service: Endoscopy;  Laterality: N/A;  ? COLPOSCOPY    ? laporotomy    ? Ovarian cyst  ? PORT-A-CATH REMOVAL Right 01/08/2016  ? Procedure: REMOVAL PORT-A-CATH;  Surgeon: Rolm Bookbinder, MD;  Location: [facility];  Service: General;  Laterality: Right;  ? PORTACATH PLACEMENT N/A 07/24/2015  ? Procedure: INSERTION PORT-A-CATH WITH [place];  Surgeon: Rolm Bookbinder, MD;  Location: [facility];  Service: General;  Laterality: N/A;  ? ROBOTIC ASSISTED SALPINGO OOPHERECTOMY Bilateral  10/10/2018  ? Procedure: XI ROBOTIC ASSISTED SALPINGO OOPHORECTOMY;  Surgeon: Everitt Amber, MD;  Location: [facility];  Service: Gynecology;  Laterality: Bilateral;  ? TONSILLECTOMY    ? child  ? ?Family History  ?Problem Relation Age of Onset  ? Stroke Mother   ? ?Social History  ? ?Socioeconomic History  ? Marital status: Married  ?  Spouse name: Micheal  ? Number of children: 0  ? Years of education: Not on file  ? Highest education level: Not on file  ?Occupational History  ? Not on file  ?Tobacco Use  ? Smoking status: Every Day  ?  Packs/day: 0.50  ?  Years: 20.00  ?  Pack years: 10.00  ?  Types: Cigarettes  ? Smokeless tobacco: Never  ?Substance and Sexual Activity  ? Alcohol use: Yes  ?  Alcohol/week:  10.0 standard drinks  ?  Types: 10 Glasses of wine per week  ?  Comment: Glasses of wine several times a week  ? Drug use: No  ? Sexual activity: Yes  ?  Birth control/protection: Surgical  ?  Comment: married  ?Other Topics Concern  ? Not on file  ?Social History Narrative  ? Married x 25 years.  ? Family lives nearby.  ? ?Social Determinants of Health  ? ?Financial Resource Strain: Low Risk   ? Difficulty of Paying Living Expenses: Not hard at all  ?Food Insecurity: No Food Insecurity  ? Worried About Charity fundraiser in the Last Year: Never true  ? Ran Out of Food in the Last Year: Never true  ?Transportation Needs: No Transportation Needs  ? Lack of Transportation (Medical): No  ? Lack of Transportation (Non-Medical): No  ?Physical Activity: Sufficiently Active  ? Days of Exercise per Week: 5 days  ? Minutes of Exercise per Session: 30 min  ?Stress: No Stress Concern Present  ? Feeling of Stress : Not at all  ?Social Connections: Socially Integrated  ? Frequency of Communication with Friends and Family: More than three times a week  ? Frequency of Social Gatherings with Friends and Family: More than three times a week  ? Attends Religious Services: More than 4 times per year  ? Active Member of Clubs or  Organizations: Yes  ? Attends Archivist Meetings: More than 4 times per year  ? Marital Status: Married  ? ? ?Tobacco Counseling ?Ready to quit: Not Answered ?Counseling given: Not Answered ? ? ?[facility]

## 2021-04-12 ENCOUNTER — Other Ambulatory Visit: Payer: Self-pay | Admitting: Family Medicine

## 2021-04-27 ENCOUNTER — Ambulatory Visit (INDEPENDENT_AMBULATORY_CARE_PROVIDER_SITE_OTHER): Payer: Medicare PPO | Admitting: Physical Medicine and Rehabilitation

## 2021-04-27 ENCOUNTER — Ambulatory Visit: Payer: Self-pay

## 2021-04-27 ENCOUNTER — Other Ambulatory Visit: Payer: Self-pay | Admitting: Family Medicine

## 2021-04-27 ENCOUNTER — Encounter: Payer: Self-pay | Admitting: Physical Medicine and Rehabilitation

## 2021-04-27 DIAGNOSIS — M5416 Radiculopathy, lumbar region: Secondary | ICD-10-CM

## 2021-04-27 MED ORDER — HYDROCODONE-ACETAMINOPHEN 10-325 MG PO TABS: 0.5000 | ORAL_TABLET | Freq: Three times a day (TID) | ORAL | 0 refills | Status: DC | PRN

## 2021-04-27 MED ORDER — METHYLPREDNISOLONE ACETATE 80 MG/ML IJ SUSP
80.0000 mg | Freq: Once | INTRAMUSCULAR | Status: AC
  Administered 2021-04-27: 80 mg

## 2021-04-27 NOTE — Telephone Encounter (Signed)
Per [facility] they do not have 5-325 or 7.5-325 in stock. Rx changed to 10-325 and sig to take 1/2 tab Q6H PRN, #15, 0 refills. This would equal the same dose as previous rx.  ? ?LOV 11/26/20 ?Last refill 03/26/21, #30, 0 refills ? ?Please review, thanks! ? ?

## 2021-04-27 NOTE — Progress Notes (Signed)
Pt state lower back pain that travels to her buttocks and left leg. Pt state sitting and lifting makes the pain worse. Pt state she takes pain meds and uses ice to help ease her pain. ? ?Numeric Pain Rating Scale and Functional Assessment ?Average Pain 6 ? ? ?In the last MONTH (on 0-10 scale) has pain interfered with the following? ? ?1. General activity like being  able to carry out your everyday physical activities such as walking, climbing stairs, carrying groceries, or moving a chair?  ?Rating(10) ? ? ?+Driver, -BT, -Dye Allergies. ? ?

## 2021-04-27 NOTE — Patient Instructions (Signed)

## 2021-05-12 NOTE — Procedures (Signed)
Lumbar Epidural Steroid Injection - Interlaminar Approach with Fluoroscopic Guidance ? ?Patient: Kim Ortega      ?Date of Birth: January 13, 1955 ?MRN: [id number] ?PCP: [name], MD      ?Visit Date: 04/27/2021 ?  ?Universal Protocol:    ? ?Consent Given By: the patient ? ?Position: PRONE ? ?Additional Comments: ?Vital signs were monitored before and after the procedure. ?Patient was prepped and draped in the usual sterile fashion. ?The correct patient, procedure, and site was verified. ? ? ?Injection Procedure Details:  ? ?Procedure diagnoses: Lumbar radiculopathy [M54.16]  ? ?Meds Administered:  ?Meds ordered this encounter  ?Medications  ? methylPREDNISolone acetate (DEPO-MEDROL) injection 80 mg  ?  ? ?Laterality: Left ? ?Location/Site:  L5-S1 ? ?Needle: 3.5 in., 20 ga. Tuohy ? ?Needle Placement: Paramedian epidural ? ?Findings:  ? -Comments: Excellent flow of contrast into the epidural space. ? ?Procedure Details: ?Using a paramedian approach from the side mentioned above, the region overlying the inferior lamina was localized under fluoroscopic visualization and the soft tissues overlying this structure were infiltrated with 4 ml. of 1% Lidocaine without Epinephrine. The Tuohy needle was inserted into the epidural space using a paramedian approach.  ? ?The epidural space was localized using loss of resistance along with counter oblique bi-planar fluoroscopic views.  After negative aspirate for air, blood, and CSF, a 2 ml. volume of Isovue-250 was injected into the epidural space and the flow of contrast was observed. Radiographs were obtained for documentation purposes.   ? ?The injectate was administered into the level noted above. ? ? ?Additional Comments:  ?The patient tolerated the procedure well ?Dressing: 2 x 2 sterile gauze and Band-Aid ?  ? ?Post-procedure details: ?Patient was observed during the procedure. ?Post-procedure instructions were reviewed. ? ?Patient left the clinic in stable  condition.  ?

## 2021-05-12 NOTE — Progress Notes (Signed)
? ?Kim Ortega - 66 y.o. female MRN [id number]  Date of birth: 1955-03-13 ? ?Office Visit Note: ?Visit Date: 04/27/2021 ?PCP: [name], MD ?Referred by: [name], MD ? ?Subjective: ?Chief Complaint  ?Patient presents with  ? Lower Back - Pain  ? Left Leg - Pain  ? ?HPI:  Kim Ortega is a 66 y.o. female who comes in today at the request of Dr. Rodell Perna for planned Left L5-S1 Lumbar Interlaminar epidural steroid injection with fluoroscopic guidance.  The patient has failed conservative care including home exercise, medications, time and activity modification.  This injection will be diagnostic and hopefully therapeutic.  Please see requesting physician notes for further details and justification. MRI reviewed with images and spine model.  MRI reviewed in the note below. ? ? ?ROS Otherwise per HPI. ? ?Assessment & Plan: ?Visit Diagnoses:  ?  ICD-10-CM   ?1. Lumbar radiculopathy  M54.16 XR C-ARM NO REPORT  ?  Epidural Steroid injection  ?  methylPREDNISolone acetate (DEPO-MEDROL) injection 80 mg  ?  ?  ?Plan: No additional findings.  ? ?Meds & Orders:  ?Meds ordered this encounter  ?Medications  ? methylPREDNISolone acetate (DEPO-MEDROL) injection 80 mg  ?  ?Orders Placed This Encounter  ?Procedures  ? XR C-ARM NO REPORT  ? Epidural Steroid injection  ?  ?Follow-up: Return for visit to requesting provider as needed.  ? ?Procedures: ?No procedures performed  ?Lumbar Epidural Steroid Injection - Interlaminar Approach with Fluoroscopic Guidance ? ?Patient: Kim Ortega      ?Date of Birth: [birth year] ?MRN: [id number] ?PCP: [name], MD      ?Visit Date: 04/27/2021 ?  ?Universal Protocol:    ? ?Consent Given By: the patient ? ?Position: PRONE ? ?Additional Comments: ?Vital signs were monitored before and after the procedure. ?Patient was prepped and draped in the usual sterile fashion. ?The correct patient, procedure, and site was verified. ? ? ?Injection Procedure  Details:  ? ?Procedure diagnoses: Lumbar radiculopathy [M54.16]  ? ?Meds Administered:  ?Meds ordered this encounter  ?Medications  ? methylPREDNISolone acetate (DEPO-MEDROL) injection 80 mg  ?  ? ?Laterality: Left ? ?Location/Site:  L5-S1 ? ?Needle: 3.5 in., 20 ga. Tuohy ? ?Needle Placement: Paramedian epidural ? ?Findings:  ? -Comments: Excellent flow of contrast into the epidural space. ? ?Procedure Details: ?Using a paramedian approach from the side mentioned above, the region overlying the inferior lamina was localized under fluoroscopic visualization and the soft tissues overlying this structure were infiltrated with 4 ml. of 1% Lidocaine without Epinephrine. The Tuohy needle was inserted into the epidural space using a paramedian approach.  ? ?The epidural space was localized using loss of resistance along with counter oblique bi-planar fluoroscopic views.  After negative aspirate for air, blood, and CSF, a 2 ml. volume of Isovue-250 was injected into the epidural space and the flow of contrast was observed. Radiographs were obtained for documentation purposes.   ? ?The injectate was administered into the level noted above. ? ? ?Additional Comments:  ?The patient tolerated the procedure well ?Dressing: 2 x 2 sterile gauze and Band-Aid ?  ? ?Post-procedure details: ?Patient was observed during the procedure. ?Post-procedure instructions were reviewed. ? ?Patient left the clinic in stable condition.   ? ?Clinical History: ?No specialty comments available.  ? ? ? ?Objective:  VS:  HT:    WT:   BMI:     BP:   HR: bpm  TEMP: ( )  RESP:  ?Physical  Exam ?Vitals and nursing note reviewed.  ?Constitutional:   ?   General: She is not in acute distress. ?   Appearance: Normal appearance. She is not ill-appearing.  ?HENT:  ?   Head: Normocephalic and atraumatic.  ?   Right Ear: External ear normal.  ?   Left Ear: External ear normal.  ?Eyes:  ?   Extraocular Movements: Extraocular movements intact.   ?Cardiovascular:  ?   Rate and Rhythm: Normal rate.  ?   Pulses: Normal pulses.  ?Pulmonary:  ?   Effort: Pulmonary effort is normal. No respiratory distress.  ?Abdominal:  ?   General: There is no distension.  ?   Palpations: Abdomen is soft.  ?Musculoskeletal:     ?   General: Tenderness present.  ?   Cervical back: Neck supple.  ?   Right lower leg: No edema.  ?   Left lower leg: No edema.  ?   Comments: Patient has good distal strength with no pain over the greater trochanters.  No clonus or focal weakness.  ?Skin: ?   Findings: No erythema, lesion or rash.  ?Neurological:  ?   General: No focal deficit present.  ?   Mental Status: She is alert and oriented to person, place, and time.  ?   Sensory: No sensory deficit.  ?   Motor: No weakness or abnormal muscle tone.  ?   Coordination: Coordination normal.  ?Psychiatric:     ?   Mood and Affect: Mood normal.     ?   Behavior: Behavior normal.  ?  ? ?Imaging: ?No results found. ?

## 2021-05-20 DIAGNOSIS — Z79899 Other long term (current) drug therapy: Secondary | ICD-10-CM | POA: Diagnosis not present

## 2021-05-20 DIAGNOSIS — L400 Psoriasis vulgaris: Secondary | ICD-10-CM | POA: Diagnosis not present

## 2021-05-20 DIAGNOSIS — R58 Hemorrhage, not elsewhere classified: Secondary | ICD-10-CM | POA: Diagnosis not present

## 2021-05-25 ENCOUNTER — Other Ambulatory Visit: Payer: Self-pay | Admitting: Family Medicine

## 2021-05-26 NOTE — Telephone Encounter (Signed)
Is this okay to refill ? 04/12/21 last filled Last ov 11/26/20

## 2021-06-04 ENCOUNTER — Other Ambulatory Visit: Payer: Self-pay | Admitting: Family Medicine

## 2021-06-05 MED ORDER — HYDROCODONE-ACETAMINOPHEN 10-325 MG PO TABS: 0.5000 | ORAL_TABLET | Freq: Three times a day (TID) | ORAL | 0 refills | Status: DC | PRN

## 2021-06-05 NOTE — Telephone Encounter (Signed)
Please see PEC note  Please advice ?

## 2021-06-12 ENCOUNTER — Other Ambulatory Visit: Payer: Self-pay | Admitting: Family Medicine

## 2021-07-08 ENCOUNTER — Other Ambulatory Visit: Payer: Self-pay | Admitting: Family Medicine

## 2021-07-08 MED ORDER — HYDROCODONE-ACETAMINOPHEN 10-325 MG PO TABS: 0.5000 | ORAL_TABLET | Freq: Three times a day (TID) | ORAL | 0 refills | Status: DC | PRN

## 2021-07-08 NOTE — Telephone Encounter (Signed)
LOV 06/07/20 Last refill 06/05/21, #15, 0 refills  Please review, thanks!

## 2021-07-15 DIAGNOSIS — L570 Actinic keratosis: Secondary | ICD-10-CM | POA: Diagnosis not present

## 2021-07-15 DIAGNOSIS — L578 Other skin changes due to chronic exposure to nonionizing radiation: Secondary | ICD-10-CM | POA: Diagnosis not present

## 2021-07-15 DIAGNOSIS — D225 Melanocytic nevi of trunk: Secondary | ICD-10-CM | POA: Diagnosis not present

## 2021-07-15 DIAGNOSIS — L821 Other seborrheic keratosis: Secondary | ICD-10-CM | POA: Diagnosis not present

## 2021-07-15 DIAGNOSIS — Z79899 Other long term (current) drug therapy: Secondary | ICD-10-CM | POA: Diagnosis not present

## 2021-07-15 DIAGNOSIS — D239 Other benign neoplasm of skin, unspecified: Secondary | ICD-10-CM | POA: Diagnosis not present

## 2021-07-15 DIAGNOSIS — L400 Psoriasis vulgaris: Secondary | ICD-10-CM | POA: Diagnosis not present

## 2021-08-03 ENCOUNTER — Encounter: Payer: Self-pay | Admitting: Family Medicine

## 2021-08-04 ENCOUNTER — Ambulatory Visit: Payer: Medicare PPO | Admitting: Family Medicine

## 2021-08-04 VITALS — BP 170/100 | HR 75 | Temp 98.2°F | Ht 64.0 in | Wt 118.0 lb

## 2021-08-04 DIAGNOSIS — T3110 Burns involving 10-19% of body surface with 0% to 9% third degree burns: Secondary | ICD-10-CM

## 2021-08-04 DIAGNOSIS — I10 Essential (primary) hypertension: Secondary | ICD-10-CM

## 2021-08-04 MED ORDER — SILVER SULFADIAZINE 1 % EX CREA: 1.0000 | TOPICAL_CREAM | Freq: Every day | CUTANEOUS | 0 refills | Status: DC
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
## 2021-08-05 ENCOUNTER — Other Ambulatory Visit: Payer: Self-pay | Admitting: Hematology and Oncology

## 2021-08-05 ENCOUNTER — Other Ambulatory Visit: Payer: Self-pay | Admitting: Family Medicine

## 2021-08-05 DIAGNOSIS — Z1231 Encounter for screening mammogram for malignant neoplasm of breast: Secondary | ICD-10-CM

## 2021-08-05 LAB — COMPLETE METABOLIC PANEL WITH GFR
AG Ratio: 1.7 (calc) (ref 1.0–2.5)
ALT: 26 U/L (ref 6–29)
AST: 33 U/L (ref 10–35)
Albumin: 4.5 g/dL (ref 3.6–5.1)
Alkaline phosphatase (APISO): 67 U/L (ref 37–153)
BUN: 12 mg/dL (ref 7–25)
CO2: 28 mmol/L (ref 20–32)
Calcium: 10 mg/dL (ref 8.6–10.4)
Chloride: 93 mmol/L — ABNORMAL LOW (ref 98–110)
Creat: 0.6 mg/dL (ref 0.50–1.05)
Globulin: 2.6 g/dL (calc) (ref 1.9–3.7)
Glucose, Bld: 78 mg/dL (ref 65–99)
Potassium: 4.3 mmol/L (ref 3.5–5.3)
Sodium: 131 mmol/L — ABNORMAL LOW (ref 135–146)
Total Bilirubin: 0.6 mg/dL (ref 0.2–1.2)
Total Protein: 7.1 g/dL (ref 6.1–8.1)
eGFR: 99 mL/min/{1.73_m2} (ref >=60)

## 2021-08-05 LAB — CBC WITH DIFFERENTIAL/PLATELET
Absolute Monocytes: 717 cells/uL (ref 200–950)
Basophils Absolute: 40 cells/uL (ref 0–200)
Basophils Relative: 0.6 %
Eosinophils Absolute: 27 cells/uL (ref 15–500)
Eosinophils Relative: 0.4 %
HCT: 36.9 % (ref 35.0–45.0)
Hemoglobin: 12.8 g/dL (ref 11.7–15.5)
Lymphs Abs: 2379 cells/uL (ref 850–3900)
MCH: 35.9 pg — ABNORMAL HIGH (ref 27.0–33.0)
MCHC: 34.7 g/dL (ref 32.0–36.0)
MCV: 103.4 fL — ABNORMAL HIGH (ref 80.0–100.0)
MPV: 9 fL (ref 7.5–12.5)
Monocytes Relative: 10.7 %
Neutro Abs: 3538 cells/uL (ref 1500–7800)
Neutrophils Relative %: 52.8 %
Platelets: 311 10*3/uL (ref 140–400)
RBC: 3.57 10*6/uL — ABNORMAL LOW (ref 3.80–5.10)
RDW: 11.9 % (ref 11.0–15.0)
Total Lymphocyte: 35.5 %
WBC: 6.7 10*3/uL (ref 3.8–10.8)

## 2021-08-05 LAB — LIPID PANEL
Cholesterol: 231 mg/dL — ABNORMAL HIGH (ref <200)
HDL: 120 mg/dL (ref >=50)
LDL Cholesterol (Calc): 96 mg/dL (calc)
Non-HDL Cholesterol (Calc): 111 mg/dL (calc) (ref <130)
Total CHOL/HDL Ratio: 1.9 (calc) (ref <5.0)
Triglycerides: 63 mg/dL (ref <150)

## 2021-08-05 MED ORDER — LOSARTAN POTASSIUM 50 MG PO TABS: 50.0000 mg | ORAL_TABLET | Freq: Every day | ORAL | 0 refills | Status: DC

## 2021-08-10 ENCOUNTER — Other Ambulatory Visit: Payer: Self-pay | Admitting: Family Medicine

## 2021-08-10 MED ORDER — HYDROCODONE-ACETAMINOPHEN 10-325 MG PO TABS: 0.5000 | ORAL_TABLET | Freq: Three times a day (TID) | ORAL | 0 refills | Status: DC | PRN

## 2021-08-11 MED ORDER — LOSARTAN POTASSIUM 50 MG PO TABS: 50.0000 mg | ORAL_TABLET | Freq: Every day | ORAL | 3 refills | Status: DC

## 2021-09-08 ENCOUNTER — Ambulatory Visit
Admission: RE | Admit: 2021-09-08 | Discharge: 2021-09-08 | Disposition: A | Discharge status: Home / Self Care | Payer: Medicare PPO | Source: Ambulatory Visit | Attending: Hematology and Oncology | Admitting: Hematology and Oncology

## 2021-09-08 DIAGNOSIS — Z1231 Encounter for screening mammogram for malignant neoplasm of breast: Secondary | ICD-10-CM

## 2021-09-09 ENCOUNTER — Other Ambulatory Visit: Payer: Self-pay | Admitting: Family Medicine

## 2021-09-09 NOTE — Telephone Encounter (Signed)
Received e-fax from pharmacy to request refill of  hydrochlorothiazide (HYDRODIURIL) 25 MG tablet [[id number]]   Pharmacy:  [id number] [id number] Phone:  [phone] Fax:  [phone] DEA #:  [id number] LOV: 08/04/2021  Please advise pharmacist at [phone]

## 2021-09-10 ENCOUNTER — Telehealth: Payer: Self-pay

## 2021-09-10 ENCOUNTER — Other Ambulatory Visit: Payer: Self-pay | Admitting: Family Medicine

## 2021-09-10 MED ORDER — HYDROCODONE-ACETAMINOPHEN 10-325 MG PO TABS
0.5000 | ORAL_TABLET | Freq: Three times a day (TID) | ORAL | 0 refills | Status: DC | PRN
Start: 2021-09-10 — End: 2021-10-10

## 2021-09-10 NOTE — Telephone Encounter (Signed)
Requested medication (s) are due for refill today: Yes  Requested medication (s) are on the active medication list: Yes  Last refill:  08/10/21  Future visit scheduled: No  Notes to clinic:  See request.    Requested Prescriptions  Pending Prescriptions Disp Refills   HYDROcodone-acetaminophen (NORCO) 10-325 MG tablet 15 tablet 0    Sig: Take 0.5 tablets by mouth every 8 (eight) hours as needed. PT NEED OV FOR FUTURE REFILLS     Not Delegated - Analgesics:  Opioid Agonist Combinations Failed - 09/09/2021  1:02 PM      Failed - This refill cannot be delegated      Failed - Urine Drug Screen completed in last 360 days      Failed - Valid encounter within last 3 months    Recent Outpatient Visits           9 months ago Acute viral conjunctivitis of left eye   [place] Eulogio Bear, NP   1 year ago Pure hypercholesterolemia   [place] [name], MD   2 years ago SIADH (syndrome of inappropriate ADH production) ([place])   [place] [name], MD   2 years ago Chronic diarrhea   [place] Pickard, Cammie Mcgee, MD   3 years ago Great toe pain, right   [place] Pickard, Cammie Mcgee, MD

## 2021-09-10 NOTE — Telephone Encounter (Signed)
Pt called requesting refill on Norco 10/325. Last RF was 08/10/2021 and last visit was 08/04/2021. [facility].

## 2021-10-05 ENCOUNTER — Other Ambulatory Visit: Payer: Self-pay | Admitting: Family Medicine

## 2021-10-05 ENCOUNTER — Encounter: Payer: Self-pay | Admitting: Family Medicine

## 2021-10-05 DIAGNOSIS — D649 Anemia, unspecified: Secondary | ICD-10-CM | POA: Insufficient documentation

## 2021-10-05 MED ORDER — LOSARTAN POTASSIUM 50 MG PO TABS
50.0000 mg | ORAL_TABLET | Freq: Two times a day (BID) | ORAL | 3 refills | Status: DC
Start: 2021-10-05 — End: 2022-02-19

## 2021-10-07 DIAGNOSIS — Z6820 Body mass index (BMI) 20.0-20.9, adult: Secondary | ICD-10-CM | POA: Diagnosis not present

## 2021-10-07 DIAGNOSIS — Z01419 Encounter for gynecological examination (general) (routine) without abnormal findings: Secondary | ICD-10-CM | POA: Diagnosis not present

## 2021-10-07 DIAGNOSIS — F419 Anxiety disorder, unspecified: Secondary | ICD-10-CM | POA: Diagnosis not present

## 2021-10-07 DIAGNOSIS — Z853 Personal history of malignant neoplasm of breast: Secondary | ICD-10-CM | POA: Diagnosis not present

## 2021-10-09 ENCOUNTER — Other Ambulatory Visit: Payer: Self-pay | Admitting: Family Medicine

## 2021-10-09 MED ORDER — ATORVASTATIN CALCIUM 40 MG PO TABS: 40.0000 mg | ORAL_TABLET | Freq: Every day | ORAL | 3 refills | Status: DC

## 2021-10-09 NOTE — Telephone Encounter (Signed)
Received eFax from pharmacy to request refill or new script for  atorvastatin (LIPITOR) 40 MG tablet   Ph[facility]:  [id number] [place] [id number] Phone:  [phone] Fax:  [phone] DEA #:  [id number] LOV: 08/04/2021 LAST OV: 04/13/2021  Please advise pharmacist at [phone] fax [phone]

## 2021-10-09 NOTE — Telephone Encounter (Signed)
Requested Prescriptions  Pending Prescriptions Disp Refills  . atorvastatin (LIPITOR) 40 MG tablet 90 tablet 0    Sig: Take 1 tablet (40 mg total) by mouth daily.     Cardiovascular:  Antilipid - Statins Failed - 10/09/2021 10:12 AM      Failed - Lipid Panel in normal range within the last 12 months    Cholesterol  Date Value Ref Range Status  08/04/2021 231 (H) <200 mg/dL Final   LDL Cholesterol (Calc)  Date Value Ref Range Status  08/04/2021 96 mg/dL (calc) Final    Comment:    Reference range: <100 . Desirable range <100 mg/dL for primary prevention;   <70 mg/dL for patients with CHD or diabetic patients  with > or = 2 CHD risk factors. [name] LDL-C is now calculated using the Martin-Hopkins  calculation, which is a validated novel method providing  better accuracy than the Friedewald equation in the  estimation of LDL-C.  Cresenciano Genre et al. Annamaria Helling. 3291;916(60): 2061-2068  (http://education.[employer].com/faq/FAQ164)    HDL  Date Value Ref Range Status  08/04/2021 120 > OR = 50 mg/dL Final   Triglycerides  Date Value Ref Range Status  08/04/2021 63 <150 mg/dL Final         Passed - Patient is not pregnant      Passed - Valid encounter within last 12 months    Recent Outpatient Visits          10 months ago Acute viral conjunctivitis of left eye   [place] Eulogio Bear, NP   1 year ago Pure hypercholesterolemia   [place] [name], MD   2 years ago SIADH (syndrome of inappropriate ADH production) ([place])   [place] [name], MD   2 years ago Chronic diarrhea   [place] Pickard, Cammie Mcgee, MD   3 years ago Great toe pain, right   [place] Pickard, Cammie Mcgee, MD

## 2021-10-10 ENCOUNTER — Other Ambulatory Visit: Payer: Self-pay | Admitting: Family Medicine

## 2021-10-12 ENCOUNTER — Other Ambulatory Visit: Payer: Self-pay | Admitting: Family Medicine

## 2021-10-13 MED ORDER — HYDROCODONE-ACETAMINOPHEN 10-325 MG PO TABS: 0.5000 | ORAL_TABLET | Freq: Three times a day (TID) | ORAL | 0 refills | Status: DC | PRN

## 2021-10-14 NOTE — Telephone Encounter (Signed)
Spoke w/pt regarding Dr. Marianne Sofia treats autoimmune diseases not regular arthritis.  I would be glad to see the patient to determine if there is any sign of autoimmune disease first." Per pt agreed and made appt to see Dr. Dennard Schaumann on 10/22/21

## 2021-10-15 ENCOUNTER — Other Ambulatory Visit: Payer: Self-pay

## 2021-10-15 ENCOUNTER — Telehealth: Payer: Self-pay

## 2021-10-15 DIAGNOSIS — I10 Essential (primary) hypertension: Secondary | ICD-10-CM

## 2021-10-15 MED ORDER — HYDROCHLOROTHIAZIDE 25 MG PO TABS: 25.0000 mg | ORAL_TABLET | Freq: Every day | ORAL | 3 refills | Status: DC

## 2021-10-15 NOTE — Telephone Encounter (Signed)
Pharmacy faxed a refill request for hydrochlorothiazide (HYDRODIURIL) 25 MG tablet [[id number]]    Order Details Dose, Route, Frequency: As Directed  Dispense Quantity: 30 tablet Refills: 0        Sig: TAKE 1 TABLET BY MOUTH EVERY DAY       Start Date: 06/12/21 End Date: --  Written Date: 06/12/21 Expiration Date: 06/12/22  Original Order:  hydrochlorothiazide (HYDRODIURIL) 25 MG tablet [[id number]]

## 2021-10-22 ENCOUNTER — Ambulatory Visit: Payer: Medicare PPO | Admitting: Family Medicine

## 2021-10-22 VITALS — BP 132/84 | HR 74 | Ht 64.0 in | Wt 117.6 lb

## 2021-10-22 DIAGNOSIS — M2550 Pain in unspecified joint: Secondary | ICD-10-CM

## 2021-10-22 NOTE — Progress Notes (Signed)
Subjective:    Patient ID: Kim Ortega, female    DOB: Nov 08, 1955, 66 y.o.   MRN: [id number]  Patient is requesting referral back to her rheumatologist.  She has a history of psoriasis and is concerned she may have psoriatic arthritis.  She reports pain in her cervical spine.  She reports pain in her right elbow.  She reports pain in the anterior lateral left hip.  The pain comes and goes.  She states she has good days.  For instance today she states that she is barely hurting.  She would need to come to the doctor if every day was like today.  Other days the pain can be quite severe and she manages it with a combination of Aleve and hydrocodone.  She denies any fevers or chills or weight loss. Past Medical History:  Diagnosis Date   Allergy    Anxiety    mild no regular meds   Arthritis    psoriatic arthritis -back   Breast cancer ([place]) 06/25/15 bx   left breastinvasive ductal ca ,grade 1   Episcleritis    Hormone replacement therapy    Hypertension    Left carotid artery stenosis    40-59% (01/2015)   Personal history of chemotherapy 2017   neoadjuvant   Personal history of radiation therapy 2018   Psoriasis    SIADH (syndrome of inappropriate ADH production) ([place])    Smoker    Past Surgical History:  Procedure Laterality Date   ABDOMINAL HYSTERECTOMY     BREAST BIOPSY Left 06/2015   BREAST LUMPECTOMY Left 01/2016   BREAST LUMPECTOMY WITH RADIOACTIVE SEED AND SENTINEL LYMPH NODE BIOPSY Left 01/08/2016   Procedure: LEFT BREAST LUMPECTOMY WITH RADIOACTIVE SEED AND SENTINEL LYMPH NODE BIOPSY, BLUE DYE INJECTION;  Surgeon: Rolm Bookbinder, MD;  Location: [facility];  Service: General;  Laterality: Left;   cervical acdf     C5-6- fusion with plates/screws "Dr. Sherwood Gambler" '12   COLONOSCOPY WITH PROPOFOL N/A 04/16/2014   Procedure: COLONOSCOPY WITH PROPOFOL;  Surgeon: Garlan Fair, MD;  [facility];  Service: Endoscopy;  Laterality: N/A;    COLPOSCOPY     laporotomy     Ovarian cyst   PORT-A-CATH REMOVAL Right 01/08/2016   Procedure: REMOVAL PORT-A-CATH;  Surgeon: Rolm Bookbinder, MD;  Location: [facility];  Service: General;  Laterality: Right;   PORTACATH PLACEMENT N/A 07/24/2015   Procedure: INSERTION PORT-A-CATH WITH [place];  Surgeon: Rolm Bookbinder, MD;  Location: [facility];  Service: General;  Laterality: N/A;   ROBOTIC ASSISTED SALPINGO OOPHERECTOMY Bilateral 10/10/2018   Procedure: XI ROBOTIC ASSISTED SALPINGO OOPHORECTOMY;  Surgeon: Everitt Amber, MD;  [facility];  Service: Gynecology;  Laterality: Bilateral;   TONSILLECTOMY     child   No Known Allergies Current Outpatient Medications on File Prior to Visit  Medication Sig Dispense Refill   ALPRAZolam (XANAX) 0.5 MG tablet Take 0.5 mg by mouth 3 (three) times daily as needed for anxiety.     aspirin EC 81 MG tablet Take 81 mg by mouth daily.     atenolol (TENORMIN) 50 MG tablet Take 1 tablet (50 mg total) by mouth daily. 90 tablet 3   atorvastatin (LIPITOR) 40 MG tablet Take 1 tablet (40 mg total) by mouth daily. 90 tablet 3   Calcium Carbonate-Vitamin D (CALCIUM-D PO) Take 1 tablet by mouth 2 (two) times daily.      clobetasol (OLUX) 0.05 % topical foam Apply topically.  cyclobenzaprine (FLEXERIL) 10 MG tablet TAKE 1 TABLET BY MOUTH THREE TIMES A DAY AS NEEDED FOR MUSCLE SPASMS 30 tablet 2   fluticasone (FLONASE) 50 MCG/ACT nasal spray SPRAY 2 SPRAYS INTO EACH NOSTRIL EVERY DAY 48 mL 2   Guselkumab (TREMFYA) 100 MG/ML SOPN Inject into the skin. Every 8 weeks     hydrochlorothiazide (HYDRODIURIL) 25 MG tablet Take 1 tablet (25 mg total) by mouth daily. 30 tablet 3   HYDROcodone-acetaminophen (NORCO) 10-325 MG tablet Take 0.5 tablets by mouth every 8 (eight) hours as needed. PT NEED OV FOR FUTURE REFILLS 15 tablet 0   ibuprofen (ADVIL) 600 MG tablet Take 1 tablet (600 mg total) by mouth every 6 (six) hours as needed for moderate pain. For AFTER surgery  only 30 tablet 0   Ketotifen Fumarate (ALLERGY EYE DROPS OP) Place 1 drop into both eyes 2 (two) times daily as needed (allergies).      losartan (COZAAR) 50 MG tablet Take 1 tablet (50 mg total) by mouth in the morning and at bedtime. 180 tablet 3   meloxicam (MOBIC) 15 MG tablet TAKE 1 TABLET (15 MG TOTAL) BY MOUTH DAILY. 30 tablet 0   Multiple Vitamin (MULTIVITAMIN WITH MINERALS) TABS tablet Take 1 tablet by mouth daily.     venlafaxine XR (EFFEXOR-XR) 37.5 MG 24 hr capsule TAKE 1 CAPSULE BY MOUTH DAILY WITH BREAKFAST. 90 capsule 2   No current facility-administered medications on file prior to visit.   Social History   Socioeconomic History   Marital status: Married    Spouse name: Micheal   Number of children: 0   Years of education: Not on file   Highest education level: Not on file  Occupational History   Not on file  Tobacco Use   Smoking status: Every Day    Packs/day: 0.50    Years: 20.00    Total pack years: 10.00    Types: Cigarettes   Smokeless tobacco: Never  Substance and Sexual Activity   Alcohol use: Yes    Alcohol/week: 10.0 standard drinks of alcohol    Types: 10 Glasses of wine per week    Comment: Glasses of wine several times a week   Drug use: No   Sexual activity: Yes    Birth control/protection: Surgical    Comment: married  Other Topics Concern   Not on file  Social History Narrative   Married x 25 years.   Family lives nearby.   Social Determinants of Health   Financial Resource Strain: Low Risk  (04/09/2021)   Overall Financial Resource Strain (CARDIA)    Difficulty of Paying Living Expenses: Not hard at all  Food Insecurity: No Food Insecurity (04/09/2021)   Hunger Vital Sign    Worried About Running Out of Food in the Last Year: Never true    Ran Out of Food in the Last Year: Never true  Transportation Needs: No Transportation Needs (04/09/2021)   PRAPARE - Hydrologist (Medical): No    Lack of Transportation  (Non-Medical): No  Physical Activity: Sufficiently Active (04/09/2021)   Exercise Vital Sign    Days of Exercise per Week: 5 days    Minutes of Exercise per Session: 30 min  Stress: No Stress Concern Present (04/09/2021)   [place]    Feeling of Stress : Not at all  Social Connections: [place] (04/09/2021)   Social Connection and Isolation Panel [NHANES]  Frequency of Communication with Friends and Family: More than three times a week    Frequency of Social Gatherings with Friends and Family: More than three times a week    Attends Religious Services: More than 4 times per year    Active Member of [employer] or Organizations: Yes    Attends Archivist Meetings: More than 4 times per year    Marital Status: Married  Human resources officer Violence: Not At Risk (04/09/2021)   Humiliation, Afraid, Rape, and Kick questionnaire    Fear of Current or Ex-Partner: No    Emotionally Abused: No    Physically Abused: No    Sexually Abused: No     Review of Systems  Gastrointestinal:  Positive for diarrhea.  Musculoskeletal:  Positive for back pain.  All other systems reviewed and are negative.      Objective:   Physical Exam Vitals reviewed.  Constitutional:      General: She is not in acute distress.    Appearance: She is well-developed. She is not diaphoretic.  HENT:     Nose: No mucosal edema.     Right Sinus: No maxillary sinus tenderness.     Left Sinus: No maxillary sinus tenderness.  Cardiovascular:     Rate and Rhythm: Normal rate and regular rhythm.     Heart sounds: Normal heart sounds. No murmur heard. Pulmonary:     Effort: Pulmonary effort is normal. No respiratory distress.     Breath sounds: Normal breath sounds. No wheezing or rales.  Musculoskeletal:     Right elbow: Tenderness present in olecranon process.     Cervical back: Pain with movement present.     Lumbar back: Bony tenderness  present. No spasms or tenderness. Decreased range of motion.     Left hip: Decreased range of motion.  Skin:    Findings: Burn present. No erythema.       Neurological:     Mental Status: She is alert.     Motor: No abnormal muscle tone.           Assessment & Plan:  Polyarthralgia - Plan: CBC with Differential/Platelet, Sedimentation rate, C-reactive protein, Rheumatoid factor, ANA, DG Elbow Complete Right, DG Hip Unilat W OR W/O Pelvis 2-3 Views Left, DG Cervical Spine Complete First we need to differentiate if the patient is experiencing autoimmune arthritis from regular osteoarthritis.  I will start by obtaining an x-ray of the cervical spine, right elbow, and left hip to determine if there is evidence of osteoarthritis.  I will also check a sedimentation rate, C-reactive protein, rheumatoid factor, and ANA, and a CBC.  If there is evidence of autoimmune disease, I will refer the patient to rheumatology.

## 2021-10-23 ENCOUNTER — Other Ambulatory Visit: Payer: Self-pay

## 2021-10-23 DIAGNOSIS — R768 Other specified abnormal immunological findings in serum: Secondary | ICD-10-CM

## 2021-10-23 LAB — SEDIMENTATION RATE: Sed Rate: 11 mm/h (ref 0–30)

## 2021-10-23 LAB — CBC WITH DIFFERENTIAL/PLATELET
Absolute Monocytes: 694 cells/uL (ref 200–950)
Basophils Absolute: 48 cells/uL (ref 0–200)
Basophils Relative: 0.7 %
Eosinophils Absolute: 41 cells/uL (ref 15–500)
Eosinophils Relative: 0.6 %
HCT: 36.1 % (ref 35.0–45.0)
Hemoglobin: 12.5 g/dL (ref 11.7–15.5)
Lymphs Abs: 1850 cells/uL (ref 850–3900)
MCH: 35.4 pg — ABNORMAL HIGH (ref 27.0–33.0)
MCHC: 34.6 g/dL (ref 32.0–36.0)
MCV: 102.3 fL — ABNORMAL HIGH (ref 80.0–100.0)
MPV: 9 fL (ref 7.5–12.5)
Monocytes Relative: 10.2 %
Neutro Abs: 4168 cells/uL (ref 1500–7800)
Neutrophils Relative %: 61.3 %
Platelets: 341 10*3/uL (ref 140–400)
RBC: 3.53 10*6/uL — ABNORMAL LOW (ref 3.80–5.10)
RDW: 11.8 % (ref 11.0–15.0)
Total Lymphocyte: 27.2 %
WBC: 6.8 10*3/uL (ref 3.8–10.8)

## 2021-10-23 LAB — RHEUMATOID FACTOR: Rheumatoid fact SerPl-aCnc: 24 IU/mL — ABNORMAL HIGH (ref <14)

## 2021-10-23 LAB — C-REACTIVE PROTEIN: CRP: 1.9 mg/L (ref <8.0)

## 2021-10-23 LAB — ANA: Anti Nuclear Antibody (ANA): NEGATIVE

## 2021-10-28 NOTE — Progress Notes (Signed)
Office Visit Note  Patient: Kim Ortega             Date of Birth: 1955-04-09           MRN: [id number]             PCP: [name], MD Referring: [name], MD Visit Date: 11/11/2021 Occupation: _0 @  Subjective:  Polyarthralgia   History of Present Illness: Kim Ortega is a 66 y.o. female who presents today for new patient consultation as requested by PCP.  She was a previous patient in our office and was last seen in the office on 03/13/2015.  At that time she was being treated for psoriasis by Dr. Delman Cheadle with topical agents.  Her arthralgias were felt to be due to osteoarthritis since she had no synovitis at that time.  In June 2017 the patient was diagnosed with breast cancer of the left breast and was under the care of Dr. Lindi Adie.  She is currently in remission and has been following up with Dr. Lindi Adie on a yearly basis.   She reports that she remains under the care of Dr. Delman Cheadle for management of psoriasis.  She was started on Tremfya about 10 months ago and has had a 99% improvement in her skin clearance.  She states she has occasional small patches of psoriasis on her scalp and uses clobetasol foam as needed.   She reports for the past 3 months she has started to experience increased arthralgias.  She has had increased pain in her right elbow, left hip, neck, and lower back.  She states that her right elbow joint pain is worse with compressionespecially with positioning while sleeping.  She states about 1 month ago she tweaked her left hip while exercising and has had intermittent discomfort on the lateral aspect.  She denies any groin pain currently.  Patient reports that she previously had a C-spine fusion performed by Dr. Sherwood Gambler.  She has been evaluated by Dr. Lorin Mercy in the past for discomfort in her lower back and had an MRI of the lumbar spine in January 2023.  She had an epidural steroid injection on 04/27/2021 which provided temporary relief.  She  denies any symptoms of radiculopathy.  She denies any joint swelling.  She takes Aleve as needed for pain relief and hydrocodone for severe pain relief.  She takes Flexeril as needed for muscle spasms in her lower back. She denies any other joint pain or joint swelling at this time.  She denies any history of Achilles tendinitis or plantar fasciitis.  She denies any other new questions or concerns.   Activities of Daily Living:  Patient reports morning stiffness for 30 minutes.   Patient Reports nocturnal pain.  Difficulty dressing/grooming: Denies Difficulty climbing stairs: Denies Difficulty getting out of chair: Denies Difficulty using hands for taps, buttons, cutlery, and/or writing: Denies  Review of Systems  Constitutional:  Negative for fatigue.  HENT:  Negative for mouth sores and mouth dryness.   Eyes:  Negative for dryness.  Respiratory:  Negative for shortness of breath.   Cardiovascular:  Negative for chest pain and palpitations.  Gastrointestinal:  Negative for blood in stool, constipation and diarrhea.  Endocrine: Negative for increased urination.  Genitourinary:  Negative for involuntary urination.  Musculoskeletal:  Positive for joint pain, joint pain, myalgias, morning stiffness, muscle tenderness and myalgias. Negative for gait problem, joint swelling and muscle weakness.  Skin:  Negative for color change, rash, hair loss and sensitivity  to sunlight.  Allergic/Immunologic: Negative for susceptible to infections.  Neurological:  Positive for dizziness and headaches.  Hematological:  Negative for swollen glands.  Psychiatric/Behavioral:  Positive for sleep disturbance. Negative for depressed mood. The patient is not nervous/anxious.     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   SIADH (syndrome of inappropriate ADH production) ([place])    Ovarian cyst 10/10/2018   Chemotherapy-induced peripheral  neuropathy ([place]) 11/25/2015   Antineoplastic chemotherapy induced anemia 08/27/2015   Breast cancer of lower-inner quadrant of left female breast ([place]) 07/03/2015   Left carotid artery stenosis    Psoriasis    Hormone replacement therapy    Smoker    Episcleritis     Past Medical History:  Diagnosis Date   Allergy    Anxiety    mild no regular meds   Arthritis    psoriatic arthritis -back   Breast cancer ([place]) 06/25/15 bx   left breastinvasive ductal ca ,grade 1   Episcleritis    Hormone replacement therapy    Hypertension    Left carotid artery stenosis    40-59% (01/2015)   Personal history of chemotherapy 2017   neoadjuvant   Personal history of radiation therapy 2018   Psoriasis    SIADH (syndrome of inappropriate ADH production) ([place])    Smoker     Family History  Problem Relation Age of Onset   Stroke Mother    Past Surgical History:  Procedure Laterality Date   ABDOMINAL HYSTERECTOMY     BREAST BIOPSY Left 06/2015   BREAST LUMPECTOMY Left 01/2016   BREAST LUMPECTOMY WITH RADIOACTIVE SEED AND SENTINEL LYMPH NODE BIOPSY Left 01/08/2016   Procedure: LEFT BREAST LUMPECTOMY WITH RADIOACTIVE SEED AND SENTINEL LYMPH NODE BIOPSY, BLUE DYE INJECTION;  Surgeon: Rolm Bookbinder, MD;  Location: [facility];  Service: General;  Laterality: Left;   cervical acdf     C5-6- fusion with plates/screws "Dr. Sherwood Gambler" '12   COLONOSCOPY WITH PROPOFOL N/A 04/16/2014   Procedure: COLONOSCOPY WITH PROPOFOL;  Surgeon: Garlan Fair, MD;  Location: [facility];  Service: Endoscopy;  Laterality: N/A;   COLPOSCOPY     laporotomy     Ovarian cyst   PORT-A-CATH REMOVAL Right 01/08/2016   Procedure: REMOVAL PORT-A-CATH;  Surgeon: Rolm Bookbinder, MD;  Location: [facility];  Service: General;  Laterality: Right;   PORTACATH PLACEMENT N/A 07/24/2015   Procedure: INSERTION PORT-A-CATH WITH [place];  Surgeon: Rolm Bookbinder, MD;  Location: [facility];  Service: General;   Laterality: N/A;   ROBOTIC ASSISTED SALPINGO OOPHERECTOMY Bilateral 10/10/2018   Procedure: XI ROBOTIC ASSISTED SALPINGO OOPHORECTOMY;  Surgeon: Everitt Amber, MD;  Location: [facility];  Service: Gynecology;  Laterality: Bilateral;   TONSILLECTOMY     child   Social History   Social History Narrative   Married x 25 years.   Family lives nearby.   Immunization History  Administered Date(s) Administered   Influenza Split 11/04/2012   Influenza,inj,Quad PF,6+ Mos 10/25/2016, 09/20/2018   Influenza-Unspecified 11/04/2013   MODERNA COVID-19 SARS-COV-2 PEDS BIVALENT BOOSTER 6Y-11Y 03/27/2019, 04/24/2019   Pneumococcal Polysaccharide-23 10/25/2016   Tdap 07/04/2009     Objective: Vital Signs: BP (!) 136/94 (BP Location: Right Arm, Patient Position: Sitting, Cuff Size: Normal)   Pulse 77   Resp 17   Ht 5' 3.5" (1.613 m)   Wt 115 lb 9.6 oz (52.4 kg)   BMI 20.16 kg/m  Physical Exam Vitals and nursing note reviewed.  Constitutional:      Appearance: She is well-developed.  HENT:     Head: Normocephalic and atraumatic.  Eyes:     Conjunctiva/sclera: Conjunctivae normal.  Cardiovascular:     Rate and Rhythm: Normal rate and regular rhythm.     Heart sounds: Normal heart sounds.  Pulmonary:     Effort: Pulmonary effort is normal.     Breath sounds: Normal breath sounds.  Abdominal:     General: Bowel sounds are normal.     Palpations: Abdomen is soft.  Musculoskeletal:     Cervical back: Normal range of motion.  Skin:    General: Skin is warm and dry.     Capillary Refill: Capillary refill takes less than 2 seconds.  Neurological:     Mental Status: She is alert and oriented to person, place, and time.  Psychiatric:        Behavior: Behavior normal.      Musculoskeletal Exam: C-spine has slightly limited range of motion with lateral rotation.  Some midline spinal tenderness in the lumbar region.  No SI joint tenderness upon palpation.  Shoulder joints have good range of  motion with no discomfort.  Tenderness over the right medial epicondyle.  Wrist joints, MCPs, PIPs, DIPs have good range of motion with no synovitis.  She has some PIP and DIP prominence consistent with osteoarthritis of both hands.  Complete fist formation noted bilaterally.  Hip joints have good range of motion with no groin pain.  She has tenderness palpation over bilateral trochanteric bursa, left greater than right.  Knee joints have good range of motion with no warmth or effusion.  Ankle joints have good range of motion with no tenderness or synovitis.  No evidence of Achilles tendinitis or plantar fasciitis.  No tenderness or synovitis over MTP joints.  CDAI Exam: CDAI Score: -- Patient Global: --; Provider Global: -- Swollen: --; Tender: -- Joint Exam 11/11/2021   No joint exam has been documented for this visit   There is currently no information documented on the homunculus. Go to the Rheumatology activity and complete the homunculus joint exam.  Investigation: No additional findings.  Imaging: No results found.  Recent Labs: Lab Results  Component Value Date   WBC 6.8 10/22/2021   HGB 12.5 10/22/2021   PLT 341 10/22/2021   NA 131 (L) 08/04/2021   K 4.3 08/04/2021   CL 93 (L) 08/04/2021   CO2 28 08/04/2021   GLUCOSE 78 08/04/2021   BUN 12 08/04/2021   CREATININE 0.60 08/04/2021   BILITOT 0.6 08/04/2021   ALKPHOS 77 03/12/2019   AST 33 08/04/2021   ALT 26 08/04/2021   PROT 7.1 08/04/2021   ALBUMIN 4.2 03/12/2019   CALCIUM 10.0 08/04/2021   GFRAA 100 06/17/2020    Speciality Comments: No specialty comments available.  Procedures:  No procedures performed Allergies: Patient has no known allergies.   Assessment / Plan:     Visit Diagnoses: Rheumatoid factor positive - 10/22/21: ANA negative, RF 24, CRP 1.9, ESR 11: She was a previous patient in our office and was last seen on 03/13/2015.  At that time she was under the care of Dr. Delman Cheadle for management of psoriasis  and was using topical agents.  She had no clinical features of psoriatic arthritis at that time.  She was diagnosed with osteoarthritis of her hands and was offered a ultrasound but she declined at that time.  She was having pain  in her neck, both elbows, and discomfort due to trochanter bursitis at that time.  The patient lost follow-up after being diagnosed with breast cancer in June 2017.  She was under the care of Dr. Lindi Adie and is currently in remission. She has remained under the care of Dr. Delman Cheadle and was started on Tremfya 10 months ago for management of psoriasis.  She has had a 99% improvement in her skin clearance since initiating systemic therapy.  She very rarely has to use clobetasol foam for small patches of psoriasis on her scalp. For the past 3 months she has been experiencing increased arthralgias.  Her pain has been most severe in her neck, lower back, right elbow, and left hip.  She previously had a C-spine fusion performed by Dr. Sherwood Gambler.  She had an MRI of the lumbar spine in January 2023 and underwent an epidural injection on 04/27/2021 by Dr. Ernestina Patches.  She is not currently experiencing any SI joint pain.  She has not noticed any joint swelling and has no history of Achilles tendinitis or plantar fasciitis.  On examination no synovitis or dactylitis was noted. Lab work on 10/22/2021 was ordered by Dr. Dennard Schaumann: ANA negative, RF 24, CRP and ESR within normal limits.  On examination I do not see any clinical features of rheumatoid arthritis at this time.  She has no tenderness or inflammation over her MCP joints.  X-rays of both hands were obtained today to assess for radiographic progression.  Anti-CCP will also be rechecked. Her right elbow joint pain is consistent with medial epicondylitis.  Her left hip discomfort is consistent with trochanteric bursitis.  Patient requested to have updated x-rays of the C-spine.  We will review x-ray and lab results at her upcoming follow-up visit. - Plan:  XR Hand 2 View Right, XR Hand 2 View Left, 14-3-3 eta Protein, Cyclic citrul peptide antibody, IgG  Psoriasis: Remains under the care of Dr. Delman Cheadle.  She was started on Tremfya 10 months ago and has had a 99% improvement in her psoriasis on systemic treatment.  She has occasional small patches of psoriasis on her scalp and uses clobetasol foam topically as needed. She has no clinical features of psoriatic arthritis at this time.  No synovitis or dactylitis noted.  No history of Achilles tendinitis or planter fasciitis.  No SI joint tenderness upon palpation.  Pain in both hands -Patient was last seen in the office on 03/13/2015.  She had x-rays of both hands which were consistent with early osteoarthritic changes.  No erosive changes were noted.  An ultrasound of both hands was offered but declined at that time. Rheumatoid factor was 24 on 10/22/2021.  ESR and CRP within normal limits on 10/22/2021.  On examination she has no tenderness or synovitis over MCP or PIP joints.  Good range of motion of both wrist joints with no tenderness or synovitis noted.  Complete fist formation bilaterally.  She has mild PIP and DIP prominence consistent with osteoarthritic changes. She is not experiencing any increased pain, stiffness, or inflammation in her hands or wrists at this time.  X-rays of both hands were obtained today for further evaluation and to assess for radiographic progression. Anti-CCP will be checked today.  Plan: XR Hand 2 View Right, XR Hand 2 View Left, 14-3-3 eta Protein, Cyclic citrul peptide antibody, IgG  Episcleritis of left eye - 10+ years ago. 1 episode. [facility].  No recurrence of symptoms.  No conjunctival injection noted.  No photophobia or eye  pain at this time.  Medial epicondylitis of elbow, right: Patient presents today with right elbow joint pain for the past 3 months.  She has not had any injury prior to the onset of symptoms.  On examination she has full extension of the right  elbow with no joint line tenderness, warmth, or synovitis.  She has tenderness palpation over the medial epicondyle of the right elbow consistent with golfers elbow.  Discussed the diagnosis today in the office along with treatment options.  Discussed that first-line treatment includes physical therapy.  She would like to try home exercises.  She was given a handout of these exercises to perform.  I also discussed the use of an epicondylitis elbow brace.  She plans on using Voltaren gel topically as needed for pain relief.  Trochanteric bursitis of both hips: She has good range of motion of both hip joints with no groin pain.  She has tenderness palpation over bilateral trochanteric bursa, left greater than right.  Patient declined a cortisone injection at this time.  She was given a handout of exercises to perform.  She was advised to notify us if her symptoms persist or worsen.  Neck pain -Patient has been experiencing increased pain and stiffness in her neck for the past 3 months.  No recent injury.  No symptoms of radiculopathy.  Of note she had a C-spine fusion performed by Dr. Sherwood Gambler in the past.  She has not followed up with Dr. Sherwood Gambler or any other planned specialist since then.  On examination she has slightly limited range of motion of the C-spine with lateral rotation.  X-rays of the C-spine were obtained today for further evaluation.  Plan: XR Cervical Spine 2 or 3 views  S/P cervical spinal fusion - Dr. Sherwood Gambler.  Limited range of motion with lateral rotation.  No symptoms of radiculopathy at this time.  X-rays of the C-spine were obtained today for further evaluation as requested.   Foraminal stenosis of lumbar region: MRI of lumbar spine 01/12/2021: Mild for age lumbar spine degeneration, generally stable since 2019.  Moderate neural foraminal stenosis at right L4 nerve level. She had an epidural injection performed by Dr. Ernestina Patches on 04/27/2021 which alleviated her discomfort but she continues  to have intermittent discomfort, stiffness, and occasional muscle spasms. She takes Aleve as needed for pain relief and if her symptoms are severe she will take hydrocodone.  If she is experiencing muscle spasms she has a prescription for Flexeril which she takes as needed to alleviate the spasms. Patient declined updated x-rays of the lumbar spine and pelvis today.  Other medical conditions are listed as follows:  Chemotherapy-induced peripheral neuropathy ([place])  Malignant neoplasm of lower-inner quadrant of left breast in female, estrogen receptor positive ([place])  Antineoplastic chemotherapy induced anemia  Hormone replacement therapy  SIADH (syndrome of inappropriate ADH production) ([place])  Cyst of left ovary  Left carotid artery stenosis  Smoker  Orders: Orders Placed This Encounter  Procedures   XR Cervical Spine 2 or 3 views   XR Hand 2 View Right   XR Hand 2 View Left   14-3-3 eta Protein   Cyclic citrul peptide antibody, IgG   No orders of the defined types were placed in this encounter.     Follow-Up Instructions: Return for NPFU.   Ofilia Neas, PA-C  Note - This record has been created using Dragon software.  Chart creation errors have been sought, but may not always  have been located. Such creation  errors do not reflect on  the standard of medical care.,

## 2021-11-11 ENCOUNTER — Encounter: Payer: Self-pay | Admitting: Physician Assistant

## 2021-11-11 ENCOUNTER — Ambulatory Visit (INDEPENDENT_AMBULATORY_CARE_PROVIDER_SITE_OTHER): Payer: Medicare PPO

## 2021-11-11 ENCOUNTER — Ambulatory Visit: Payer: Medicare PPO | Attending: Physician Assistant | Admitting: Physician Assistant

## 2021-11-11 VITALS — BP 136/94 | HR 77 | Resp 17 | Ht 63.5 in | Wt 115.6 lb

## 2021-11-11 DIAGNOSIS — R768 Other specified abnormal immunological findings in serum: Secondary | ICD-10-CM

## 2021-11-11 DIAGNOSIS — M542 Cervicalgia: Secondary | ICD-10-CM

## 2021-11-11 DIAGNOSIS — H15102 Unspecified episcleritis, left eye: Secondary | ICD-10-CM | POA: Diagnosis not present

## 2021-11-11 DIAGNOSIS — G620 Drug-induced polyneuropathy: Secondary | ICD-10-CM

## 2021-11-11 DIAGNOSIS — N83202 Unspecified ovarian cyst, left side: Secondary | ICD-10-CM

## 2021-11-11 DIAGNOSIS — E222 Syndrome of inappropriate secretion of antidiuretic hormone: Secondary | ICD-10-CM

## 2021-11-11 DIAGNOSIS — F17200 Nicotine dependence, unspecified, uncomplicated: Secondary | ICD-10-CM

## 2021-11-11 DIAGNOSIS — M79641 Pain in right hand: Secondary | ICD-10-CM | POA: Diagnosis not present

## 2021-11-11 DIAGNOSIS — M7701 Medial epicondylitis, right elbow: Secondary | ICD-10-CM | POA: Diagnosis not present

## 2021-11-11 DIAGNOSIS — M48061 Spinal stenosis, lumbar region without neurogenic claudication: Secondary | ICD-10-CM | POA: Diagnosis not present

## 2021-11-11 DIAGNOSIS — Z981 Arthrodesis status: Secondary | ICD-10-CM | POA: Diagnosis not present

## 2021-11-11 DIAGNOSIS — H15103 Unspecified episcleritis, bilateral: Secondary | ICD-10-CM

## 2021-11-11 DIAGNOSIS — L409 Psoriasis, unspecified: Secondary | ICD-10-CM

## 2021-11-11 DIAGNOSIS — M79642 Pain in left hand: Secondary | ICD-10-CM

## 2021-11-11 DIAGNOSIS — M7062 Trochanteric bursitis, left hip: Secondary | ICD-10-CM

## 2021-11-11 DIAGNOSIS — I6522 Occlusion and stenosis of left carotid artery: Secondary | ICD-10-CM

## 2021-11-11 DIAGNOSIS — C50312 Malignant neoplasm of lower-inner quadrant of left female breast: Secondary | ICD-10-CM

## 2021-11-11 DIAGNOSIS — M7061 Trochanteric bursitis, right hip: Secondary | ICD-10-CM

## 2021-11-11 DIAGNOSIS — D6481 Anemia due to antineoplastic chemotherapy: Secondary | ICD-10-CM

## 2021-11-11 DIAGNOSIS — Z170 Estrogen receptor positive status [ER+]: Secondary | ICD-10-CM

## 2021-11-11 DIAGNOSIS — T451X5A Adverse effect of antineoplastic and immunosuppressive drugs, initial encounter: Secondary | ICD-10-CM

## 2021-11-11 DIAGNOSIS — Z79890 Hormone replacement therapy (postmenopausal): Secondary | ICD-10-CM

## 2021-11-11 NOTE — Patient Instructions (Signed)
[facility] Elbow Rehab Ask your health care provider which exercises are safe for you. Do exercises exactly as told by your health care provider and adjust them as directed. It is normal to feel mild stretching, pulling, tightness, or discomfort as you do these exercises. Stop right away if you feel sudden pain or your pain gets worse. Do not begin these exercises until told by your health care provider. Stretching and range-of-motion exercises These exercises warm up your muscles and joints and improve the movement and flexibility of your elbow. Wrist extension, assisted  Straighten your left / right elbow in front of you with your palm facing up toward the ceiling. If told by your health care provider, bend your left / right elbow to a 90-degree angle (right angle) at your side instead of holding it straight. With your other hand, gently pull your left / right hand and fingers toward the floor (extension). Stop when you feel a gentle stretch on the palm side of your forearm. Hold this position for __________ seconds. Repeat __________ times. Complete this exercise __________ times a day. Wrist flexion, assisted  Straighten your left / right elbow in front of you with your palm facing down toward the floor. If told by your health care provider, bend your left / right elbow to a 90-degree angle (right angle) at your side instead of holding it straight. With your other hand, gently push over the back of your left / right hand so your fingers point toward the floor (flexion). Stop when you feel a gentle stretch on the back of your forearm. Hold this position for __________ seconds. Repeat __________ times. Complete this exercise __________ times a day. Assisted forearm rotation, supination Sit or stand with your elbows at your side. Bend your left / right elbow to a 90-degree angle (right angle). Using your uninjured hand, turn your left / right palm up toward the ceiling (supination) until you feel  a gentle stretch along the inside of your forearm. Hold this position for __________ seconds. Repeat __________ times. Complete this exercise __________ times a day. Assisted forearm rotation, pronation Sit or stand with your elbows at your side. Bend your left / right elbow to a 90-degree angle (right angle). Using your uninjured hand, turn your left / right palm down toward the floor (pronation) until you feel a gentle stretch along the top of your forearm. Hold this position for __________ seconds. Repeat __________ times. Complete this exercise __________ times a day. Strengthening exercises These exercises build strength and endurance in your elbow. Endurance is the ability to use your muscles for a long time, even after they get tired. Wrist flexion  Sit with your left / right forearm supported on a table or other surface and your palm turned up toward the ceiling. Let your left / right wrist extend over the edge of the surface. Hold a __________ weight or a piece of rubber exercise band or tubing. If using a rubber exercise band or tubing, hold the other end of the tubing with your other hand. Slowly bend your wrist so your hand moves up toward the ceiling (flexion). Try to only move your wrist and keep the rest of your arm still. Hold this position for __________ seconds. Slowly return to the starting position. Repeat __________ times. Complete this exercise __________ times a day. Wrist flexion, eccentric Sit with your left / right forearm palm-up and supported on a table or other surface. Let your left / right wrist extend over the  edge of the surface. Hold a __________ weight or a piece of rubber exercise band or tubing in your left / right hand. If using a rubber exercise band or tubing, hold the other end of the tubing with your other hand. Use your uninjured hand to move your left / right hand up toward the ceiling. Take your uninjured hand away and slowly return to the  starting position using only your left / right hand (eccentric flexion). Repeat __________ times. Complete this exercise __________ times a day. Forearm rotation, pronation To do this exercise, you will need a lightweight hammer or rubber mallet. Sit with your left / right forearm supported on a table or other surface. Bend your elbow to a 90-degree angle (right angle). Position your forearm so that your palm is facing up toward the ceiling, with your hand resting over the edge of the table. Hold a hammer in your left / right hand. To make this exercise easier, hold the hammer near the head of the hammer. To make this exercise harder, hold the hammer near the end of the handle. Without moving your elbow, slowly turn (rotate) your forearm so your palm faces down toward the floor (pronation). Hold this position for __________ seconds. Slowly return to the starting position. Repeat __________ times. Complete this exercise __________ times a day. Shoulder blade squeeze Sit in a stable chair or stand with good posture. If you are sitting down, do not let your back touch the back of the chair. Your arms should be at your sides with your elbows bent to a 90-degree angle (right angle). Position your forearms so that your thumbs are facing the ceiling (neutral position). Without lifting your shoulders up, squeeze your shoulder blades tightly together. Hold this position for __________ seconds. Slowly release and return to the starting position. Repeat __________ times. Complete this exercise __________ times a day. This information is not intended to replace advice given to you by your health care provider. Make sure you discuss any questions you have with your health care provider. Document Revised: 03/14/2019 Document Reviewed: 03/14/2019 Elsevier Patient Education  [place].   Hip Bursitis Rehab Ask your health care provider which exercises are safe for you. Do exercises exactly as told  by your health care provider and adjust them as directed. It is normal to feel mild stretching, pulling, tightness, or discomfort as you do these exercises. Stop right away if you feel sudden pain or your pain gets worse. Do not begin these exercises until told by your health care provider. Stretching exercise This exercise warms up your muscles and joints and improves the movement and flexibility of your hip. This exercise also helps to relieve pain and stiffness. Iliotibial band stretch An iliotibial band is a strong band of muscle tissue that runs from the outer side of your hip to the outer side of your thigh and knee. Lie on your side with your left / right leg in the top position. Bend your left / right knee and grab your ankle. Stretch out your bottom arm to help you balance. Slowly bring your knee back so your thigh is slightly behind your body. Slowly lower your knee toward the floor until you feel a gentle stretch on the outside of your left / right thigh. If you do not feel a stretch and your knee will not lower more toward the floor, place the heel of your other foot on top of your knee and pull your knee down toward the  floor with your foot. Hold this position for __________ seconds. Slowly return to the starting position. Repeat __________ times. Complete this exercise __________ times a day. Strengthening exercises These exercises build strength and endurance in your hip and pelvis. Endurance is the ability to use your muscles for a long time, even after they get tired. Bridge This exercise strengthens the muscles that move your thigh backward (hip extensors). Lie on your back on a firm surface with your knees bent and your feet flat on the floor. Tighten your buttocks muscles and lift your buttocks off the floor until your trunk is level with your thighs. Do not arch your back. You should feel the muscles working in your buttocks and the back of your thighs. If you do not feel  these muscles, slide your feet 1-2 inches (2.5-5 cm) farther away from your buttocks. If this exercise is too easy, try doing it with your arms crossed over your chest. Hold this position for __________ seconds. Slowly lower your hips to the starting position. Let your muscles relax completely after each repetition. Repeat __________ times. Complete this exercise __________ times a day. Squats This exercise strengthens the muscles in front of your thigh and knee (quadriceps). Stand in front of a table, with your feet and knees pointing straight ahead. You may rest your hands on the table for balance but not for support. Slowly bend your knees and lower your hips like you are going to sit in a chair. Keep your weight over your heels, not over your toes. Keep your lower legs upright so they are parallel with the table legs. Do not let your hips go lower than your knees. Do not bend lower than told by your health care provider. If your hip pain increases, do not bend as low. Hold the squat position for __________ seconds. Slowly push with your legs to return to standing. Do not use your hands to pull yourself to standing. Repeat __________ times. Complete this exercise __________ times a day. Hip hike  Stand sideways on a bottom step. Stand on your left / right leg with your other foot unsupported next to the step. You can hold on to the railing or wall for balance if needed. Keep your knees straight and your torso square. Then lift your left / right hip up toward the ceiling. Hold this position for __________ seconds. Slowly let your left / right hip lower toward the floor, past the starting position. Your foot should get closer to the floor. Do not lean or bend your knees. Repeat __________ times. Complete this exercise __________ times a day. Single leg stand This exercise increases your balance. Without shoes, stand near a railing or in a doorway. You may hold on to the railing or door  frame as needed for balance. Squeeze your left / right buttock muscles, then lift up your other foot. Do not let your left / right hip push out to the side. It is helpful to stand in front of a mirror for this exercise so you can watch your hip. Hold this position for __________ seconds. Repeat __________ times. Complete this exercise __________ times a day. This information is not intended to replace advice given to you by your health care provider. Make sure you discuss any questions you have with your health care provider. Document Revised: 12/03/2020 Document Reviewed: 12/03/2020 Elsevier Patient Education  [place].

## 2021-11-13 LAB — CYCLIC CITRUL PEPTIDE ANTIBODY, IGG: Cyclic Citrullin Peptide Ab: <16 UNITS

## 2021-11-16 ENCOUNTER — Other Ambulatory Visit: Payer: Self-pay | Admitting: Hematology and Oncology

## 2021-11-18 ENCOUNTER — Other Ambulatory Visit: Payer: Self-pay | Admitting: Family Medicine

## 2021-11-19 DIAGNOSIS — Z79899 Other long term (current) drug therapy: Secondary | ICD-10-CM | POA: Diagnosis not present

## 2021-11-19 DIAGNOSIS — R58 Hemorrhage, not elsewhere classified: Secondary | ICD-10-CM | POA: Diagnosis not present

## 2021-11-19 DIAGNOSIS — L409 Psoriasis, unspecified: Secondary | ICD-10-CM | POA: Diagnosis not present

## 2021-11-19 MED ORDER — HYDROCODONE-ACETAMINOPHEN 10-325 MG PO TABS: 0.5000 | ORAL_TABLET | Freq: Three times a day (TID) | ORAL | 0 refills | Status: DC | PRN

## 2021-11-23 ENCOUNTER — Telehealth: Payer: Self-pay

## 2021-11-23 NOTE — Telephone Encounter (Signed)
Pt is in need of a refill on Hydrocodone 5 mg. Dr. Dennard Schaumann sent in the 10 mg and she was taking 1/2 tab because [facility]. Pt states now they are out of the 10 mg but have the 5 mg. Can we send int a refill for her? Thank you.

## 2021-11-24 ENCOUNTER — Other Ambulatory Visit: Payer: Self-pay | Admitting: Family Medicine

## 2021-11-24 ENCOUNTER — Other Ambulatory Visit: Payer: Self-pay

## 2021-11-25 ENCOUNTER — Other Ambulatory Visit: Payer: Self-pay | Admitting: Family Medicine

## 2021-11-25 MED ORDER — HYDROCODONE-ACETAMINOPHEN 5-325 MG PO TABS: 1.0000 | ORAL_TABLET | Freq: Three times a day (TID) | ORAL | 0 refills | Status: DC | PRN

## 2021-11-26 NOTE — Progress Notes (Signed)
Office Visit Note  Patient: Kim Ortega             Date of Birth: 05-05-1955           MRN: [id number]             PCP: [name], MD Referring: [name], MD Visit Date: 12/09/2021 Occupation: _0 @  Subjective:  Pain in multiple joints and positive rheumatoid factor  History of Present Illness: Kim Ortega is a 66 y.o. female with history of psoriasis, osteoarthritis and positive rheumatoid factor.  She states she has been doing quite well since the last visit.  She started doing stretching exercises and using Voltaren gel over the right medial epicondyle region which has been helpful.  She also do stretching exercises for IT band which has improved.  She has been exercising on a regular basis which helps.  She states that Tremfya has been working well as regards to psoriasis and generalized aches and pains.  She continues to have some neck and lower back pain due to underlying disc disease.  None of the other joints are painful or swollen.  Activities of Daily Living:  Patient reports morning stiffness for 5 minutes.   Patient Reports nocturnal pain.  Difficulty dressing/grooming: Denies Difficulty climbing stairs: Denies Difficulty getting out of chair: Denies Difficulty using hands for taps, buttons, cutlery, and/or writing: Denies  Review of Systems  Constitutional:  Negative for fatigue.  HENT:  Negative for mouth sores and mouth dryness.   Eyes:  Negative for dryness.  Respiratory:  Negative for shortness of breath.   Cardiovascular:  Negative for chest pain and palpitations.  Gastrointestinal:  Negative for blood in stool, constipation and diarrhea.  Endocrine: Negative for increased urination.  Genitourinary:  Negative for involuntary urination.  Musculoskeletal:  Positive for morning stiffness. Negative for joint pain, gait problem, joint pain, joint swelling, myalgias, muscle weakness, muscle tenderness and myalgias.  Skin:   Negative for color change, rash, hair loss and sensitivity to sunlight.  Allergic/Immunologic: Negative for susceptible to infections.  Neurological:  Positive for headaches. Negative for dizziness.  Hematological:  Negative for swollen glands.  Psychiatric/Behavioral:  Negative for depressed mood and sleep disturbance. The patient is not nervous/anxious.     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   SIADH (syndrome of inappropriate ADH production) ([place])    Ovarian cyst 10/10/2018   Chemotherapy-induced peripheral neuropathy ([place]) 11/25/2015   Antineoplastic chemotherapy induced anemia 08/27/2015   Breast cancer of lower-inner quadrant of left female breast ([place]) 07/03/2015   Left carotid artery stenosis    Psoriasis    Hormone replacement therapy    Smoker    Episcleritis     Past Medical History:  Diagnosis Date   Allergy    Anxiety    mild no regular meds   Arthritis    psoriatic arthritis -back   Breast cancer ([place]) 06/25/15 bx   left breastinvasive ductal ca ,grade 1   Episcleritis    Hormone replacement therapy    Hypertension    Left carotid artery stenosis    40-59% (01/2015)   Personal history of chemotherapy 2017   neoadjuvant   Personal history of radiation therapy 2018   Psoriasis    SIADH (syndrome of inappropriate ADH production) ([place])    Smoker     Family History  Problem Relation Age of Onset   Stroke Mother  Past Surgical History:  Procedure Laterality Date   ABDOMINAL HYSTERECTOMY     BREAST BIOPSY Left 06/2015   BREAST LUMPECTOMY Left 01/2016   BREAST LUMPECTOMY WITH RADIOACTIVE SEED AND SENTINEL LYMPH NODE BIOPSY Left 01/08/2016   Procedure: LEFT BREAST LUMPECTOMY WITH RADIOACTIVE SEED AND SENTINEL LYMPH NODE BIOPSY, BLUE DYE INJECTION;  Surgeon: Rolm Bookbinder, MD;  Location: [facility];  Service: General;  Laterality: Left;   cervical acdf     C5-6-  fusion with plates/screws "Dr. Sherwood Gambler" '12   COLONOSCOPY WITH PROPOFOL N/A 04/16/2014   Procedure: COLONOSCOPY WITH PROPOFOL;  Surgeon: Garlan Fair, MD;  Location: [facility];  Service: Endoscopy;  Laterality: N/A;   COLPOSCOPY     laporotomy     Ovarian cyst   PORT-A-CATH REMOVAL Right 01/08/2016   Procedure: REMOVAL PORT-A-CATH;  Surgeon: Rolm Bookbinder, MD;  Location: [facility];  Service: General;  Laterality: Right;   PORTACATH PLACEMENT N/A 07/24/2015   Procedure: INSERTION PORT-A-CATH WITH [place];  Surgeon: Rolm Bookbinder, MD;  Location: [facility];  Service: General;  Laterality: N/A;   ROBOTIC ASSISTED SALPINGO OOPHERECTOMY Bilateral 10/10/2018   Procedure: XI ROBOTIC ASSISTED SALPINGO OOPHORECTOMY;  Surgeon: Everitt Amber, MD;  Location: [facility];  Service: Gynecology;  Laterality: Bilateral;   TONSILLECTOMY     child   Social History   Social History Narrative   Married x 25 years.   Family lives nearby.   Immunization History  Administered Date(s) Administered   Influenza Split 11/04/2012   Influenza,inj,Quad PF,6+ Mos 10/25/2016, 09/20/2018   Influenza-Unspecified 11/04/2013   MODERNA COVID-19 SARS-COV-2 PEDS BIVALENT BOOSTER 6Y-11Y 03/27/2019, 04/24/2019   Pneumococcal Polysaccharide-23 10/25/2016   Tdap 07/04/2009     Objective: Vital Signs: BP 117/77 (BP Location: Right Arm, Patient Position: Sitting, Cuff Size: Normal)   Pulse 86   Resp 16   Ht _0  (1.6 m)   Wt 115 lb 9.6 oz (52.4 kg)   BMI 20.48 kg/m    Physical Exam Vitals and nursing note reviewed.  Constitutional:      Appearance: She is well-developed.  HENT:     Head: Normocephalic and atraumatic.  Eyes:     Conjunctiva/sclera: Conjunctivae normal.  Cardiovascular:     Rate and Rhythm: Normal rate and regular rhythm.     Heart sounds: Normal heart sounds.  Pulmonary:     Effort: Pulmonary effort is normal.     Breath sounds: Normal breath sounds.  Abdominal:     General:  Bowel sounds are normal.     Palpations: Abdomen is soft.  Musculoskeletal:     Cervical back: Normal range of motion.  Lymphadenopathy:     Cervical: No cervical adenopathy.  Skin:    General: Skin is warm and dry.     Capillary Refill: Capillary refill takes less than 2 seconds.  Neurological:     Mental Status: She is alert and oriented to person, place, and time.  Psychiatric:        Behavior: Behavior normal.      Musculoskeletal Exam: She has some stiffness with lateral rotation of the cervical spine.  Thoracic and lumbar spine were in good range of motion without discomfort.  Shoulder joints, elbow joints, wrist joints, MCPs PIPs and DIPs Juengel range of motion with no synovitis.  Mild PIP and DIP thickening with no synovitis was noted.  Hip joints and knee joints with good range of motion.  There was no tenderness over ankles or MTPs.  There  was no Achilles tendinitis or planter fasciitis.  She had no SI joint tenderness. CDAI Exam: CDAI Score: -- Patient Global: --; Provider Global: -- Swollen: --; Tender: -- Joint Exam 12/09/2021   No joint exam has been documented for this visit   There is currently no information documented on the homunculus. Go to the Rheumatology activity and complete the homunculus joint exam.  Investigation: No additional findings.  Imaging: XR Hand 2 View Left  Result Date: 11/11/2021 CMC narrowing and subluxation was noted.  PIP and DIP narrowing was noted.  No MCP, intercarpal or radiocarpal joint space narrowing was noted.  No erosive changes were noted. Impression: These findings are consistent with osteoarthritis of the hand.  XR Hand 2 View Right  Result Date: 11/11/2021 PIP and DIP narrowing was noted.  No CMC, MCP, intercarpal or radiocarpal joint space narrowing was noted.  No erosive changes were noted. Impression: These findings are consistent with osteoarthritis of the hand.  XR Cervical Spine 2 or 3 views  Result Date:  11/11/2021 Multilevel spondylosis and facet joint arthropathy was noted.  Surgical fusion was noted of C5, C6 and C7. Impression: Multilevel spondylosis and facet joint arthropathy with surgical fusion from C5-C7 noted.   Recent Labs: Lab Results  Component Value Date   WBC 6.8 10/22/2021   HGB 12.5 10/22/2021   PLT 341 10/22/2021   NA 131 (L) 08/04/2021   K 4.3 08/04/2021   CL 93 (L) 08/04/2021   CO2 28 08/04/2021   GLUCOSE 78 08/04/2021   BUN 12 08/04/2021   CREATININE 0.60 08/04/2021   BILITOT 0.6 08/04/2021   ALKPHOS 77 03/12/2019   AST 33 08/04/2021   ALT 26 08/04/2021   PROT 7.1 08/04/2021   ALBUMIN 4.2 03/12/2019   CALCIUM 10.0 08/04/2021   GFRAA 100 06/17/2020   12/01/2021 anti-CCP negative October 22, 2021 ESR 19, CRP 1.9, RF 24, ANA negative    Speciality Comments: No specialty comments available.  Procedures:  No procedures performed Allergies: Patient has no known allergies.   Assessment / Plan:     Visit Diagnoses: Rheumatoid factor positive - Rheumatoid factor 24, anti-CCP negative, normal, CRP negative.  Lab results were discussed with the patient.  She has low titer positive rheumatoid factor but has no clinical features of rheumatoid arthritis.  Psoriasis -she is on Tremfya since January 2023 by Dr. Delman Cheadle with 99% skin clearance.  She uses clobetasol foam on the scalp.  Episcleritis of left eye-she had an episode 20 years ago with no recurrence.  Pain in both hands - h/o pain in bilateral hands for many years.  No synovitis or dactylitis was noted.  X-rays of the hand at the visit were consistent with osteoarthritis.  X-ray findings were discussed with the patient.  She was advised to contact me if she develops any swelling.  Medial epicondylitis of elbow, right - History of recurrent right medial epicondylitis.  Patient was given a handout on exercises at the last visit.  Patient states with the stretching exercises and application of Voltaren gel her  symptoms have improved.  Trochanteric bursitis of both hips - Bilateral trochanteric bursitis was noted at the last visit.  A handout on IT band stretches was given at the last visit.  She noticed improvement in trochanteric bursitis which is almost resolved.  DDD (degenerative disc disease), cervical - Status post fusion by Dr. Sherwood Gambler.  Patient has limited range of motion without discomfort.  DDD (degenerative disc disease), lumbar -she gives history of  episodic pain in her lower back.  MRI January 12, 2021 showed mild degeneration with foraminal stenosis.  Epidural injection by Dr. Ernestina Patches in April 2023 relieved pain.    Other medical problems are listed as follows:  Malignant neoplasm of lower-inner quadrant of left breast in female, estrogen receptor positive [facility]) - dxd June 2017.  Chemotherapy-induced peripheral neuropathy ([facility])  Antineoplastic chemotherapy induced anemia  Hormone replacement therapy  SIADH (syndrome of inappropriate ADH production) ([facility])  Left carotid artery stenosis  Smoker  Orders: No orders of the defined types were placed in this encounter.  No orders of the defined types were placed in this encounter.    Follow-Up Instructions: Return if symptoms worsen or fail to improve, for Osteoarthritis.   Bo Merino, MD  Note - This record has been created using Editor, commissioning.  Chart creation errors have been sought, but may not always  have been located. Such creation errors do not reflect on  the standard of medical care.

## 2021-12-04 ENCOUNTER — Other Ambulatory Visit: Payer: Self-pay | Admitting: Family Medicine

## 2021-12-09 ENCOUNTER — Ambulatory Visit: Payer: Medicare PPO | Attending: Rheumatology | Admitting: Rheumatology

## 2021-12-09 ENCOUNTER — Encounter: Payer: Self-pay | Admitting: Rheumatology

## 2021-12-09 VITALS — BP 117/77 | HR 86 | Resp 16 | Ht 63.0 in | Wt 115.6 lb

## 2021-12-09 DIAGNOSIS — C50312 Malignant neoplasm of lower-inner quadrant of left female breast: Secondary | ICD-10-CM

## 2021-12-09 DIAGNOSIS — Z170 Estrogen receptor positive status [ER+]: Secondary | ICD-10-CM

## 2021-12-09 DIAGNOSIS — E222 Syndrome of inappropriate secretion of antidiuretic hormone: Secondary | ICD-10-CM

## 2021-12-09 DIAGNOSIS — F17200 Nicotine dependence, unspecified, uncomplicated: Secondary | ICD-10-CM

## 2021-12-09 DIAGNOSIS — L409 Psoriasis, unspecified: Secondary | ICD-10-CM | POA: Diagnosis not present

## 2021-12-09 DIAGNOSIS — M79641 Pain in right hand: Secondary | ICD-10-CM

## 2021-12-09 DIAGNOSIS — M79642 Pain in left hand: Secondary | ICD-10-CM

## 2021-12-09 DIAGNOSIS — M7701 Medial epicondylitis, right elbow: Secondary | ICD-10-CM

## 2021-12-09 DIAGNOSIS — M7061 Trochanteric bursitis, right hip: Secondary | ICD-10-CM | POA: Diagnosis not present

## 2021-12-09 DIAGNOSIS — H15102 Unspecified episcleritis, left eye: Secondary | ICD-10-CM | POA: Diagnosis not present

## 2021-12-09 DIAGNOSIS — I6522 Occlusion and stenosis of left carotid artery: Secondary | ICD-10-CM

## 2021-12-09 DIAGNOSIS — R768 Other specified abnormal immunological findings in serum: Secondary | ICD-10-CM

## 2021-12-09 DIAGNOSIS — Z79890 Hormone replacement therapy (postmenopausal): Secondary | ICD-10-CM

## 2021-12-09 DIAGNOSIS — D6481 Anemia due to antineoplastic chemotherapy: Secondary | ICD-10-CM

## 2021-12-09 DIAGNOSIS — M5136 Other intervertebral disc degeneration, lumbar region: Secondary | ICD-10-CM

## 2021-12-09 DIAGNOSIS — M5030 Other cervical disc degeneration, unspecified cervical region: Secondary | ICD-10-CM | POA: Diagnosis not present

## 2021-12-09 DIAGNOSIS — M7062 Trochanteric bursitis, left hip: Secondary | ICD-10-CM

## 2021-12-09 DIAGNOSIS — T451X5A Adverse effect of antineoplastic and immunosuppressive drugs, initial encounter: Secondary | ICD-10-CM

## 2021-12-09 DIAGNOSIS — G620 Drug-induced polyneuropathy: Secondary | ICD-10-CM

## 2021-12-21 ENCOUNTER — Other Ambulatory Visit: Payer: Self-pay | Admitting: Family Medicine

## 2021-12-21 MED ORDER — HYDROCODONE-ACETAMINOPHEN 5-325 MG PO TABS: 1.0000 | ORAL_TABLET | Freq: Three times a day (TID) | ORAL | 0 refills | Status: DC | PRN

## 2021-12-31 ENCOUNTER — Other Ambulatory Visit: Payer: Self-pay

## 2021-12-31 DIAGNOSIS — I10 Essential (primary) hypertension: Secondary | ICD-10-CM

## 2021-12-31 MED ORDER — HYDROCHLOROTHIAZIDE 25 MG PO TABS: 25.0000 mg | ORAL_TABLET | Freq: Every day | ORAL | 3 refills | Status: DC

## 2022-01-15 ENCOUNTER — Other Ambulatory Visit: Payer: Self-pay | Admitting: Family Medicine

## 2022-01-15 MED ORDER — HYDROCODONE-ACETAMINOPHEN 5-325 MG PO TABS: 1.0000 | ORAL_TABLET | Freq: Three times a day (TID) | ORAL | 0 refills | Status: DC | PRN

## 2022-02-08 ENCOUNTER — Other Ambulatory Visit: Payer: Self-pay | Admitting: Family Medicine

## 2022-02-08 NOTE — Telephone Encounter (Signed)
Prescription Request  02/08/2022  Is this a "Controlled Substance" medicine? No  LOV: 10/22/2021  What is the name of the medication or equipment? cyclobenzaprine (FLEXERIL) 10 MG table   Have you contacted your pharmacy to request a refill? Yes   Which pharmacy would you like this sent to?  [id number]: [phone]: [phone]   Patient notified that their request is being sent to the clinical staff for review and that they should receive a response within 2 business days.   [phone]

## 2022-02-09 MED ORDER — CYCLOBENZAPRINE HCL 10 MG PO TABS: ORAL_TABLET | ORAL | 0 refills | Status: DC

## 2022-02-09 MED ORDER — HYDROCODONE-ACETAMINOPHEN 5-325 MG PO TABS: 1.0000 | ORAL_TABLET | Freq: Three times a day (TID) | ORAL | 0 refills | Status: DC | PRN

## 2022-02-09 NOTE — Telephone Encounter (Signed)
Requested medication (s) are due for refill today: yes  Requested medication (s) are on the active medication list: yes  Last refill:  10/12/21 #30/2  Future visit scheduled: no  Notes to clinic:  Unable to refill per protocol, cannot delegate.    Requested Prescriptions  Pending Prescriptions Disp Refills   cyclobenzaprine (FLEXERIL) 10 MG tablet 30 tablet 2     Not Delegated - Analgesics:  Muscle Relaxants Failed - 02/08/2022 12:38 PM      Failed - This refill cannot be delegated      Failed - Valid encounter within last 6 months    Recent Outpatient Visits           1 year ago Acute viral conjunctivitis of left eye   [place] Eulogio Bear, NP   1 year ago Pure hypercholesterolemia   [place] [name], MD   2 years ago SIADH (syndrome of inappropriate ADH production) ([place])   [place] [name], MD   2 years ago Chronic diarrhea   [place] Pickard, Cammie Mcgee, MD   4 years ago Great toe pain, right   [place] Pickard, Cammie Mcgee, MD

## 2022-02-17 ENCOUNTER — Other Ambulatory Visit: Payer: Self-pay | Admitting: Family Medicine

## 2022-02-17 MED ORDER — ATENOLOL 50 MG PO TABS: 50.0000 mg | ORAL_TABLET | Freq: Every day | ORAL | 0 refills | Status: DC

## 2022-02-17 NOTE — Telephone Encounter (Signed)
Requested Prescriptions  Pending Prescriptions Disp Refills   atenolol (TENORMIN) 50 MG tablet 90 tablet 0    Sig: Take 1 tablet (50 mg total) by mouth daily.     Cardiovascular: Beta Blockers 2 Failed - 02/17/2022  9:40 AM      Failed - Valid encounter within last 6 months    Recent Outpatient Visits           1 year ago Acute viral conjunctivitis of left eye   [place] Eulogio Bear, NP   1 year ago Pure hypercholesterolemia   [place] [name], MD   2 years ago SIADH (syndrome of inappropriate ADH production) ([place])   [place] Pickard, Cammie Mcgee, MD   2 years ago Chronic diarrhea   [place]enn[place] Schaumann, Cammie Mcgee, MD   4 years ago Great toe pain, right   [place] Pickard, Cammie Mcgee, MD              Passed - Cr in normal range and within 360 days    Creatinine  Date Value Ref Range Status  05/20/2016 0.8 0.6 - 1.1 mg/dL Final   Creat  Date Value Ref Range Status  08/04/2021 0.60 0.50 - 1.05 mg/dL Final         Passed - Last BP in normal range    BP Readings from Last 1 Encounters:  12/09/21 117/77         Passed - Last Heart Rate in normal range    Pulse Readings from Last 1 Encounters:  12/09/21 86

## 2022-02-17 NOTE — Telephone Encounter (Signed)
Prescription Request  02/17/2022  Is this a "Controlled Substance" medicine? No  LOV: 10/22/2021  What is the name of the medication or equipment?   atenolol (TENORMIN) 50 MG tablet   Have you contacted your pharmacy to request a refill? Yes   Which pharmacy would you like this sent to?  [id number] [id number]: [phone]: [phone]   Patient notified that their request is being sent to the clinical staff for review and that they should receive a response within 2 business days.   Please advise pharmacist at [phone]

## 2022-02-19 ENCOUNTER — Telehealth: Payer: Self-pay

## 2022-02-19 ENCOUNTER — Other Ambulatory Visit: Payer: Self-pay

## 2022-02-19 MED ORDER — LOSARTAN POTASSIUM 50 MG PO TABS: 50.0000 mg | ORAL_TABLET | Freq: Two times a day (BID) | ORAL | 1 refills | Status: DC

## 2022-02-19 NOTE — Telephone Encounter (Signed)
Prescription Request  02/19/2022  Is this a "Controlled Substance" medicine? No  LOV: 10/22/21  What is the name of the medication or equipment? losartan (COZAAR) 50 MG tablet [id number]  Have you contacted your pharmacy to request a refill? Yes   Which pharmacy would you like this sent to?  [id number]Phone: [phone]: [phone]   Patient notified that their request is being sent to the clinical staff for review and that they should receive a response within 2 business days.   Please [facility] at [phone]

## 2022-03-11 ENCOUNTER — Other Ambulatory Visit: Payer: Self-pay | Admitting: Family Medicine

## 2022-03-11 MED ORDER — HYDROCODONE-ACETAMINOPHEN 5-325 MG PO TABS: 1.0000 | ORAL_TABLET | Freq: Three times a day (TID) | ORAL | 0 refills | Status: DC | PRN

## 2022-03-16 ENCOUNTER — Telehealth: Payer: Self-pay | Admitting: Family Medicine

## 2022-03-16 NOTE — Telephone Encounter (Signed)
Reached out to patient to reschedule per Gudena being out of office, patient is aware of date and time of appointment.

## 2022-03-29 ENCOUNTER — Inpatient Hospital Stay: Payer: Medicare PPO | Admitting: Hematology and Oncology

## 2022-03-31 ENCOUNTER — Other Ambulatory Visit: Payer: Self-pay | Admitting: Family Medicine

## 2022-04-01 MED ORDER — HYDROCODONE-ACETAMINOPHEN 5-325 MG PO TABS: 1.0000 | ORAL_TABLET | Freq: Three times a day (TID) | ORAL | 0 refills | Status: DC | PRN

## 2022-04-01 NOTE — Telephone Encounter (Signed)
Requested medication (s) are due for refill today:   Provider to review  Requested medication (s) are on the active medication list:   Yes  Future visit scheduled:   Yes for AWV   LOV 10/22/2021   Last ordered: Non delegated refill reason returned.   Requested Prescriptions  Pending Prescriptions Disp Refills   cyclobenzaprine (FLEXERIL) 10 MG tablet [Pharmacy Med Name: CYCLOBENZAPRINE 10 MG TABLET] 30 tablet 0    Sig: TAKE 1 TABLET BY MOUTH THREE TIMES A DAY AS NEEDED FOR MUSCLE SPASM     Not Delegated - Analgesics:  Muscle Relaxants Failed - 03/31/2022 10:27 AM      Failed - This refill cannot be delegated      Failed - Valid encounter within last 6 months    Recent Outpatient Visits           1 year ago Acute viral conjunctivitis of left eye   [place] Eulogio Bear, NP   1 year ago Pure hypercholesterolemia   [place] [name], MD   2 years ago SIADH (syndrome of inappropriate ADH production) ([place])   [place] [name], MD   2 years ago Chronic diarrhea   [place] Pickard, Cammie Mcgee, MD   4 years ago Great toe pain, right   [place] Pickard, Cammie Mcgee, MD

## 2022-04-14 NOTE — Patient Instructions (Incomplete)
Ms. Kim Ortega , Thank you for taking time to come for your Medicare Wellness Visit. I appreciate your ongoing commitment to your health goals. Please review the following plan we discussed and let me know if I can assist you in the future.   These are the goals we discussed:  Goals      Exercise 3x per week (30 min per time)     Continue to exercise and eat healthy. Travel.         This is a list of the screening recommended for you and due dates:  Health Maintenance  Topic Date Due   Hepatitis C Screening: USPSTF Recommendation to screen - Ages 86-79 yo.  Never done   COVID-19 Vaccine (3 - Moderna risk series) 05/22/2019   DTaP/Tdap/Td vaccine (2 - Td or Tdap) 07/05/2019   Pneumonia Vaccine (2 of 2 - PCV) 04/15/2022*   Zoster (Shingles) Vaccine (1 of 2) 04/15/2022*   Flu Shot  08/05/2022   Medicare Annual Wellness Visit  04/15/2023   Mammogram  09/09/2023   Colon Cancer Screening  04/28/2024   DEXA scan (bone density measurement)  Completed   HPV Vaccine  Aged Out  *Topic was postponed. The date shown is not the original due date.    Advanced directives: We have a copy of your advanced directives available in your record should your provider ever need to access them.   Conditions/risks identified: Aim for 30 minutes of exercise or brisk walking, 6-8 glasses of water, and 5 servings of fruits and vegetables each day.   Next appointment: Follow up in one year for your annual wellness visit   Happy Birthday on the [birth year] !!!!!   Preventive Care 9 Years and Older, Female Preventive care refers to lifestyle choices and visits with your health care provider that can promote health and wellness. What does preventive care include? A yearly physical exam. This is also called an annual well check. Dental exams once or twice a year. Routine eye exams. Ask your health care provider how often you should have your eyes checked. Personal lifestyle choices, including: Daily care of your  teeth and gums. Regular physical activity. Eating a healthy diet. Avoiding tobacco and drug use. Limiting alcohol use. Practicing safe sex. Taking low-dose aspirin every day. Taking vitamin and mineral supplements as recommended by your health care provider. What happens during an annual well check? The services and screenings done by your health care provider during your annual well check will depend on your age, overall health, lifestyle risk factors, and family history of disease. Counseling  Your health care provider may ask you questions about your: Alcohol use. Tobacco use. Drug use. Emotional well-being. Home and relationship well-being. Sexual activity. Eating habits. History of falls. Memory and ability to understand (cognition). Work and work Astronomer. Reproductive health. Screening  You may have the following tests or measurements: Height, weight, and BMI. Blood pressure. Lipid and cholesterol levels. These may be checked every 5 years, or more frequently if you are over 66 years old. Skin check. Lung cancer screening. You may have this screening every year starting at age 66 if you have a 30-pack-year history of smoking and currently smoke or have quit within the past 15 years. Fecal occult blood test (FOBT) of the stool. You may have this test every year starting at age 2. Flexible sigmoidoscopy or colonoscopy. You may have a sigmoidoscopy every 5 years or a colonoscopy every 10 years starting at age 29. Hepatitis C blood test.  Hepatitis B blood test. Sexually transmitted disease (STD) testing. Diabetes screening. This is done by checking your blood sugar (glucose) after you have not eaten for a while (fasting). You may have this done every 1-3 years. Bone density scan. This is done to screen for osteoporosis. You may have this done starting at age 36. Mammogram. This may be done every 1-2 years. Talk to your health care provider about how often you should have  regular mammograms. Talk with your health care provider about your test results, treatment options, and if necessary, the need for more tests. Vaccines  Your health care provider may recommend certain vaccines, such as: Influenza vaccine. This is recommended every year. Tetanus, diphtheria, and acellular pertussis (Tdap, Td) vaccine. You may need a Td booster every 10 years. Zoster vaccine. You may need this after age 10. Pneumococcal 13-valent conjugate (PCV13) vaccine. One dose is recommended after age 67. Pneumococcal polysaccharide (PPSV23) vaccine. One dose is recommended after age 49. Talk to your health care provider about which screenings and vaccines you need and how often you need them. This information is not intended to replace advice given to you by your health care provider. Make sure you discuss any questions you have with your health care provider. Document Released: 01/17/2015 Document Revised: 09/10/2015 Document Reviewed: 10/22/2014 Elsevier Interactive Patient Education  2017 [employer].  Fall Prevention in the Home Falls can cause injuries. They can happen to people of all ages. There are many things you can do to make your home safe and to help prevent falls. What can I do on the outside of my home? Regularly fix the edges of walkways and driveways and fix any cracks. Remove anything that might make you trip as you walk through a door, such as a raised step or threshold. Trim any bushes or trees on the path to your home. Use bright outdoor lighting. Clear any walking paths of anything that might make someone trip, such as rocks or tools. Regularly check to see if handrails are loose or broken. Make sure that both sides of any steps have handrails. Any raised decks and porches should have guardrails on the edges. Have any leaves, snow, or ice cleared regularly. Use sand or salt on walking paths during winter. Clean up any spills in your garage right away. This  includes oil or grease spills. What can I do in the bathroom? Use night lights. Install grab bars by the toilet and in the tub and shower. Do not use towel bars as grab bars. Use non-skid mats or decals in the tub or shower. If you need to sit down in the shower, use a plastic, non-slip stool. Keep the floor dry. Clean up any water that spills on the floor as soon as it happens. Remove soap buildup in the tub or shower regularly. Attach bath mats securely with double-sided non-slip rug tape. Do not have throw rugs and other things on the floor that can make you trip. What can I do in the bedroom? Use night lights. Make sure that you have a light by your bed that is easy to reach. Do not use any sheets or blankets that are too big for your bed. They should not hang down onto the floor. Have a firm chair that has side arms. You can use this for support while you get dressed. Do not have throw rugs and other things on the floor that can make you trip. What can I do in the kitchen? Clean up  any spills right away. Avoid walking on wet floors. Keep items that you use a lot in easy-to-reach places. If you need to reach something above you, use a strong step stool that has a grab bar. Keep electrical cords out of the way. Do not use floor polish or wax that makes floors slippery. If you must use wax, use non-skid floor wax. Do not have throw rugs and other things on the floor that can make you trip. What can I do with my stairs? Do not leave any items on the stairs. Make sure that there are handrails on both sides of the stairs and use them. Fix handrails that are broken or loose. Make sure that handrails are as long as the stairways. Check any carpeting to make sure that it is firmly attached to the stairs. Fix any carpet that is loose or worn. Avoid having throw rugs at the top or bottom of the stairs. If you do have throw rugs, attach them to the floor with carpet tape. Make sure that you  have a light switch at the top of the stairs and the bottom of the stairs. If you do not have them, ask someone to add them for you. What else can I do to help prevent falls? Wear shoes that: Do not have high heels. Have rubber bottoms. Are comfortable and fit you well. Are closed at the toe. Do not wear sandals. If you use a stepladder: Make sure that it is fully opened. Do not climb a closed stepladder. Make sure that both sides of the stepladder are locked into place. Ask someone to hold it for you, if possible. Clearly mark and make sure that you can see: Any grab bars or handrails. First and last steps. Where the edge of each step is. Use tools that help you move around (mobility aids) if they are needed. These include: Canes. Walkers. Scooters. Crutches. Turn on the lights when you go into a dark area. Replace any light bulbs as soon as they burn out. Set up your furniture so you have a clear path. Avoid moving your furniture around. If any of your floors are uneven, fix them. If there are any pets around you, be aware of where they are. Review your medicines with your doctor. Some medicines can make you feel dizzy. This can increase your chance of falling. Ask your doctor what other things that you can do to help prevent falls. This information is not intended to replace advice given to you by your health care provider. Make sure you discuss any questions you have with your health care provider. Document Released: 10/17/2008 Document Revised: 05/29/2015 Document Reviewed: 01/25/2014 Elsevier Interactive Patient Education  2017 [employer].

## 2022-04-15 ENCOUNTER — Ambulatory Visit: Payer: Medicare PPO

## 2022-04-15 VITALS — Ht 63.0 in | Wt 115.0 lb

## 2022-04-15 DIAGNOSIS — Z0000 Encounter for general adult medical examination without abnormal findings: Secondary | ICD-10-CM | POA: Diagnosis not present

## 2022-04-15 NOTE — Progress Notes (Signed)
Subjective:   Kim Ortega is a 67 y.o. female who presents for Medicare Annual (Subsequent) preventive examination.  I connected with  Elon Spanner Staheli on 04/15/22 by a audio enabled telemedicine application and verified that I am speaking with the correct person using two identifiers.  Patient Location: Home  Provider Location: Office/Clinic  I discussed the limitations of evaluation and management by telemedicine. The patient expressed understanding and agreed to proceed.  Review of Systems     Cardiac Risk Factors include: advanced age (>44men, >33 women);smoking/ tobacco exposure;sedentary lifestyle     Objective:    Today's Vitals   04/15/22 0834  Weight: 115 lb (52.2 kg)  Height: 5\' 3"  (1.6 m)   Body mass index is 20.37 kg/m.     04/15/2022    8:37 AM 04/09/2021    8:29 AM 10/10/2018    6:37 AM 10/06/2018    9:38 AM 09/29/2018   10:18 AM 05/20/2016   12:49 PM 04/27/2016    8:24 AM  Advanced Directives  Does Patient Have a Medical Advance Directive? Yes Yes Yes Yes Yes No Yes  Type of Advance Directive Living will;Healthcare Power of [employer] Power of [place];Living will Healthcare Power of [place];Living will Healthcare Power of [place];Living will   Healthcare Power of [place];Living will  Does patient want to make changes to medical advance directive? No - Patient declined  No - Patient declined No - Patient declined     Copy of Healthcare Power of Attorney in Chart? Yes - validated most recent copy scanned in chart (See row information) Yes - validated most recent copy scanned in chart (See row information) Yes - validated most recent copy scanned in chart (See row information) Yes - validated most recent copy scanned in chart (See row information)     Would patient like information on creating a medical advance directive?   No - Patient declined        Current Medications (verified) Outpatient Encounter Medications as of 04/15/2022   Medication Sig   ALPRAZolam (XANAX) 0.5 MG tablet Take 0.5 mg by mouth 3 (three) times daily as needed for anxiety.   aspirin EC 81 MG tablet Take 81 mg by mouth daily.   atenolol (TENORMIN) 50 MG tablet Take 1 tablet (50 mg total) by mouth daily.   atorvastatin (LIPITOR) 40 MG tablet Take 1 tablet (40 mg total) by mouth daily.   Calcium Carbonate-Vitamin D (CALCIUM-D PO) Take 1 tablet by mouth 2 (two) times daily.    clobetasol (OLUX) 0.05 % topical foam Apply topically.   cyclobenzaprine (FLEXERIL) 10 MG tablet TAKE 1 TABLET BY MOUTH THREE TIMES A DAY AS NEEDED FOR MUSCLE SPASM   fluticasone (FLONASE) 50 MCG/ACT nasal spray SPRAY 2 SPRAYS INTO EACH NOSTRIL EVERY DAY   Guselkumab (TREMFYA) 100 MG/ML SOPN Inject into the skin. Every 8 weeks   hydrochlorothiazide (HYDRODIURIL) 25 MG tablet Take 1 tablet (25 mg total) by mouth daily.   HYDROcodone-acetaminophen (NORCO) 5-325 MG tablet Take 1 tablet by mouth every 8 (eight) hours as needed for moderate pain.   ibuprofen (ADVIL) 600 MG tablet Take 1 tablet (600 mg total) by mouth every 6 (six) hours as needed for moderate pain. For AFTER surgery only   Ketotifen Fumarate (ALLERGY EYE DROPS OP) Place 1 drop into both eyes 2 (two) times daily as needed (allergies).    losartan (COZAAR) 50 MG tablet Take 1 tablet (50 mg total) by mouth in the morning and at bedtime.  meloxicam (MOBIC) 15 MG tablet TAKE 1 TABLET (15 MG TOTAL) BY MOUTH DAILY.   Multiple Vitamin (MULTIVITAMIN WITH MINERALS) TABS tablet Take 1 tablet by mouth daily.   venlafaxine XR (EFFEXOR-XR) 37.5 MG 24 hr capsule TAKE 1 CAPSULE BY MOUTH DAILY WITH BREAKFAST.   No facility-administered encounter medications on file as of 04/15/2022.    Allergies (verified) Patient has no known allergies.   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 ,grade 1   Episcleritis    Hormone replacement therapy    Hypertension    Left carotid artery stenosis    40-59% (01/2015)   Personal history of chemotherapy 2017   neoadjuvant   Personal history of radiation therapy 2018   Psoriasis    SIADH (syndrome of inappropriate ADH production)    Smoker    Past Surgical History:  Procedure Laterality Date   ABDOMINAL HYSTERECTOMY     BREAST BIOPSY Left 06/2015   BREAST LUMPECTOMY Left 01/2016   BREAST LUMPECTOMY WITH RADIOACTIVE SEED AND SENTINEL LYMPH NODE BIOPSY Left 01/08/2016   Procedure: LEFT BREAST LUMPECTOMY WITH RADIOACTIVE SEED AND SENTINEL LYMPH NODE BIOPSY, BLUE DYE INJECTION;  Surgeon: Emelia Loron, MD;  Location: [facility];  Service: General;  Laterality: Left;   cervical acdf     C5-6- fusion with plates/screws "Dr. Newell Coral" '12   COLONOSCOPY WITH PROPOFOL N/A 04/16/2014   Procedure: COLONOSCOPY WITH PROPOFOL;  Surgeon: Charolett Bumpers, MD;  Location: [facility];  Service: Endoscopy;  Laterality: N/A;   COLPOSCOPY     laporotomy     Ovarian cyst   PORT-A-CATH REMOVAL Right 01/08/2016   Procedure: REMOVAL PORT-A-CATH;  Surgeon: Emelia Loron, MD;  Location: [facility];  Service: General;  Laterality: Right;   PORTACATH PLACEMENT N/A 07/24/2015   Procedure: INSERTION PORT-A-CATH WITH [place];  Surgeon: Emelia Loron, MD;  Location: [facility];  Service: General;  Laterality: N/A;   ROBOTIC ASSISTED SALPINGO OOPHERECTOMY Bilateral 10/10/2018   Procedure: XI ROBOTIC ASSISTED SALPINGO OOPHORECTOMY;  Surgeon: Adolphus Birchwood, MD;  Location: [facility];  Service: Gynecology;  Laterality: Bilateral;   TONSILLECTOMY     child   Family History  Problem Relation Age of Onset   Stroke Mother    Social History   Socioeconomic History   Marital status: Married    Spouse name: Micheal   Number of children: 0   Years of education: Not on file   Highest education level: Not on file  Occupational  History   Not on file  Tobacco Use   Smoking status: Every Day    Packs/day: 0.50    Years: 20.00    Additional pack years: 0.00    Total pack years: 10.00    Types: Cigarettes    Passive exposure: Never   Smokeless tobacco: Never  Vaping Use   Vaping Use: Never used  Substance and Sexual Activity   Alcohol use: Yes    Alcohol/week: 10.0 standard drinks of alcohol    Types: 10 Glasses of wine per week    Comment: Glasses of wine several times a week   Drug use: No   Sexual activity: Yes    Birth control/protection: Surgical    Comment: married  Other Topics Concern   Not on file  Social History Narrative  Married x 25 years.   Family lives nearby.   Social Determinants of Health   Financial Resource Strain: Low Risk  (04/15/2022)   Overall Financial Resource Strain (CARDIA)    Difficulty of Paying Living Expenses: Not hard at all  Food Insecurity: No Food Insecurity (04/15/2022)   Hunger Vital Sign    Worried About Running Out of Food in the Last Year: Never true    Ran Out of Food in the Last Year: Never true  Transportation Needs: No Transportation Needs (04/15/2022)   PRAPARE - Administrator, Civil ServiceTransportation    Lack of Transportation (Medical): No    Lack of Transportation (Non-Medical): No  Physical Activity: Insufficiently Active (04/15/2022)   Exercise Vital Sign    Days of Exercise per Week: 3 days    Minutes of Exercise per Session: 30 min  Stress: No Stress Concern Present (04/15/2022)   [employer] - Occupational Stress Questionnaire    Feeling of Stress : Not at all  Social Connections: Socially Integrated (04/15/2022)   Social Connection and Isolation Panel [NHANES]    Frequency of Communication with Friends and Family: More than three times a week    Frequency of Social Gatherings with Friends and Family: Three times a week    Attends Religious Services: More than 4 times per year    Active Member of Clubs or Organizations: Yes    Attends  Engineer, structuralClub or Organization Meetings: More than 4 times per year    Marital Status: Married    Tobacco Counseling Ready to quit: Not Answered Counseling given: Not Answered   Clinical Intake:  Pre-visit preparation completed: Yes  Pain : No/denies pain  Diabetes: No  How often do you need to have someone help you when you read instructions, pamphlets, or other written materials from your doctor or pharmacy?: 1 - Never  Diabetic?No   Interpreter Needed?: No  Information entered by :: Kandis Fantasiaourtney Amily Depp LPN   Activities of Daily Living    04/15/2022    8:36 AM  In your present state of health, do you have any difficulty performing the following activities:  Hearing? 0  Vision? 0  Difficulty concentrating or making decisions? 0  Walking or climbing stairs? 0  Dressing or bathing? 0  Doing errands, shopping? 0  Preparing Food and eating ? N  Using the Toilet? N  In the past six months, have you accidently leaked urine? N  Do you have problems with loss of bowel control? N  Managing your Medications? N  Managing your Finances? N  Housekeeping or managing your Housekeeping? N    Patient Care Team: Donita BrooksPickard, Warren T, MD as PCP - General (Family Medicine) Serena CroissantGudena, Vinay, MD as Consulting Physician (Hematology and Oncology) Axel Fillerausey, Larna DaughtersLindsey Cornetto, NP as Nurse Practitioner (Hematology and Oncology) Dorothy PufferMoody, John, MD as Consulting Physician (Radiation Oncology) Emelia LoronWakefield, Matthew, MD as Consulting Physician (General Surgery)  Indicate any recent Medical Services you may have received from other than [facility] providers in the past year (date may be approximate).     Assessment:   This is a routine wellness examination for Kim Ortega.  Hearing/Vision screen Hearing Screening - Comments:: Denies hearing difficulties  Vision Screening - Comments:: Wears rx glasses - up to date with routine eye exams with [facility]    Dietary issues and exercise activities discussed: Current Exercise Habits:  Home exercise routine, Type of exercise: walking, Time (Minutes): 30, Frequency (Times/Week): 3, Weekly Exercise (Minutes/Week): 90, Intensity: Mild   Goals Addressed   None  Depression Screen    04/15/2022    8:38 AM 10/22/2021    8:54 AM 04/09/2021    8:23 AM 07/06/2017    9:58 AM 02/21/2017    2:04 PM 02/08/2017    9:14 AM 11/17/2016    2:19 PM  PHQ 2/9 Scores  PHQ - 2 Score 0 1 0 0 0 0 0    Fall Risk    04/15/2022    8:36 AM 10/22/2021    8:54 AM 04/09/2021    8:30 AM 07/06/2017    9:58 AM 04/27/2016    8:25 AM  Fall Risk   Falls in the past year? 0 0 0 No No  Number falls in past yr: 0 0 0    Injury with Fall? 0 0 0    Risk for fall due to : No Fall Risks No Fall Risks No Fall Risks    Follow up Falls prevention discussed;Education provided;Falls evaluation completed Falls prevention discussed Falls prevention discussed      FALL RISK PREVENTION PERTAINING TO THE HOME:  Any stairs in or around the home? No  If so, are there any without handrails? No  Home free of loose throw rugs in walkways, pet beds, electrical cords, etc? Yes  Adequate lighting in your home to reduce risk of falls? Yes   ASSISTIVE DEVICES UTILIZED TO PREVENT FALLS:  Life alert? No  Use of a cane, walker or w/c? No  Grab bars in the bathroom? Yes  Shower chair or bench in shower? No  Elevated toilet seat or a handicapped toilet? Yes   TIMED UP AND GO:  Was the test performed? No . Telephonic visit   Cognitive Function:        04/15/2022    8:37 AM 04/09/2021    8:32 AM  6CIT Screen  What Year? 0 points 0 points  What month? 0 points 0 points  What time? 0 points 0 points  Count back from 20 0 points 0 points  Months in reverse 0 points 0 points  Repeat phrase 0 points 0 points  Total Score 0 points 0 points    Immunizations Immunization History  Administered Date(s) Administered   Influenza Split 11/04/2012   Influenza,inj,Quad PF,6+ Mos 10/25/2016, 09/20/2018   Influenza-Unspecified  11/04/2013   MODERNA COVID-19 SARS-COV-2 PEDS BIVALENT BOOSTER 6Y-11Y 03/27/2019, 04/24/2019   Pneumococcal Polysaccharide-23 10/25/2016   Tdap 07/04/2009    TDAP status: Due, Education has been provided regarding the importance of this vaccine. Advised may receive this vaccine at local pharmacy or Health Dept. Aware to provide a copy of the vaccination record if obtained from local pharmacy or Health Dept. Verbalized acceptance and understanding.  Flu Vaccine status: Up to date  Pneumococcal vaccine status: Due, Education has been provided regarding the importance of this vaccine. Advised may receive this vaccine at local pharmacy or Health Dept. Aware to provide a copy of the vaccination record if obtained from local pharmacy or Health Dept. Verbalized acceptance and understanding.  Covid-19 vaccine status: Information provided on how to obtain vaccines.   Qualifies for Shingles Vaccine? Yes   Zostavax completed No   Shingrix Completed?: No.    Education has been provided regarding the importance of this vaccine. Patient has been advised to call insurance company to determine out of pocket expense if they have not yet received this vaccine. Advised may also receive vaccine at local pharmacy or Health Dept. Verbalized acceptance and understanding.  Screening Tests Health Maintenance  Topic Date Due  Hepatitis C Screening  Never done   COVID-19 Vaccine (3 - Moderna risk series) 05/22/2019   DTaP/Tdap/Td (2 - Td or Tdap) 07/05/2019   Pneumonia Vaccine 63+ Years old (2 of 2 - PCV) 04/15/2022 (Originally 10/25/2017)   Zoster Vaccines- Shingrix (1 of 2) 04/15/2022 (Originally 04/20/1974)   INFLUENZA VACCINE  08/05/2022   Medicare Annual Wellness (AWV)  04/15/2023   MAMMOGRAM  09/09/2023   COLONOSCOPY (Pts 45-25yrs Insurance coverage will need to be confirmed)  04/28/2024   DEXA SCAN  Completed   HPV VACCINES  Aged Out    Health Maintenance  Health Maintenance Due  Topic Date Due    Hepatitis C Screening  Never done   COVID-19 Vaccine (3 - Moderna risk series) 05/22/2019   DTaP/Tdap/Td (2 - Td or Tdap) 07/05/2019    Colorectal cancer screening: Type of screening: Colonoscopy. Completed 04/16/14. Repeat every 10 years  Mammogram status: Completed 09/08/21. Repeat every year  Bone Density status: Completed 11/02/16. Results reflect: Bone density results: NORMAL. Repeat every 5 years.  Lung Cancer Screening: (Low Dose CT Chest recommended if Age 67-80 years, 30 pack-year currently smoking OR have quit w/in 15years.) does not qualify.   Lung Cancer Screening Referral: n/a  Additional Screening:  Hepatitis C Screening: does qualify;  Vision Screening: Recommended annual ophthalmology exams for early detection of glaucoma and other disorders of the eye. Is the patient up to date with their annual eye exam?  Yes  Who is the provider or what is the name of the office in which the patient attends annual eye exams? Dr. Dione Booze  If pt is not established with a provider, would they like to be referred to a provider to establish care? No .   Dental Screening: Recommended annual dental exams for proper oral hygiene  Community Resource Referral / Chronic Care Management: CRR required this visit?  No   CCM required this visit?  No      Plan:     I have personally reviewed and noted the following in the patient's chart:   Medical and social history Use of alcohol, tobacco or illicit drugs  Current medications and supplements including opioid prescriptions. Patient is currently taking opioid prescriptions. Information provided to patient regarding non-opioid alternatives. Patient advised to discuss non-opioid treatment plan with their provider. Functional ability and status Nutritional status Physical activity Advanced directives List of other physicians Hospitalizations, surgeries, and ER visits in previous 12 months Vitals Screenings to include cognitive, depression,  and falls Referrals and appointments  In addition, I have reviewed and discussed with patient certain preventive protocols, quality metrics, and best practice recommendations. A written personalized care plan for preventive services as well as general preventive health recommendations were provided to patient.     Durwin Nora, [place]   09/19/3844   Due to this being a virtual visit, the after visit summary with patients personalized plan was offered to patient via mail or my-chart. Patient would like to access on my-chart  Nurse Notes: No concerns

## 2022-04-16 NOTE — Progress Notes (Signed)
Patient Care Team: [name], MD as PCP - General (Family Medicine) Serena Croissant, MD as Consulting Physician (Hematology and Oncology) Axel Filler, Larna Daughters, NP as Nurse Practitioner (Hematology and Oncology) Dorothy Puffer, MD as Consulting Physician (Radiation Oncology) Emelia Loron, MD as Consulting Physician (General Surgery) Kim Else, MD as Consulting Physician (Dermatology) [facility], [facility]  DIAGNOSIS: No diagnosis found.  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  06/25/2015 Initial Diagnosis   Left breast biopsy 8:30 position: IDC grade 1, ER 90%, PR 0%, 50%, HER-2 Negative Ratio 1.21, Irregular Mass Left Breast 1 Cm from Nipple 2.5 x 2 x 2.5 Cm with Distortion, T2 N0 Stage II a Clinical Stage, Oncotype DX score 52, 34% ROR   07/30/2015 - 12/16/2015 Neo-Adjuvant Chemotherapy   Neoadjuvant chemotherapy with dose dense Adriamycin and Cytoxan 4 followed by Abraxane weekly 12   12/18/2015 Breast MRI   Excellent interval response to chemotherapy with only minimal residual enhancement, 2 tiny rounded regions along superior margin, unchanged, no abnormal lymph nodes    01/08/2016 Surgery   Left Lumpectomy Dwain Sarna): IDC grade 1, 0.2 cm, Margins Neg, 0/1 LN Neg; [id number]   02/10/2016 - 03/23/2016 Radiation Therapy   Adjuvant radiation   04/04/2016 -  Anti-estrogen oral therapy   Anastrozole 1 mg daily switched to letrozole 01/04/2017     CHIEF COMPLIANT:   INTERVAL HISTORY: Kim Ortega is a   ALLERGIES:  has No Known Allergies.  MEDICATIONS:  Current Outpatient Medications  Medication Sig Dispense Refill   ALPRAZolam (XANAX) 0.5 MG tablet Take 0.5 mg by mouth 3 (three) times daily as needed for anxiety.     aspirin EC 81 MG tablet Take 81 mg by mouth daily.     atenolol (TENORMIN) 50 MG tablet Take 1 tablet (50 mg total) by mouth  daily. 90 tablet 0   atorvastatin (LIPITOR) 40 MG tablet Take 1 tablet (40 mg total) by mouth daily. 90 tablet 3   Calcium Carbonate-Vitamin D (CALCIUM-D PO) Take 1 tablet by mouth 2 (two) times daily.      clobetasol (OLUX) 0.05 % topical foam Apply topically.     cyclobenzaprine (FLEXERIL) 10 MG tablet TAKE 1 TABLET BY MOUTH THREE TIMES A DAY AS NEEDED FOR MUSCLE SPASM 30 tablet 0   fluticasone (FLONASE) 50 MCG/ACT nasal spray SPRAY 2 SPRAYS INTO EACH NOSTRIL EVERY DAY 48 mL 2   Guselkumab (TREMFYA) 100 MG/ML SOPN Inject into the skin. Every 8 weeks     hydrochlorothiazide (HYDRODIURIL) 25 MG tablet Take 1 tablet (25 mg total) by mouth daily. 30 tablet 3   HYDROcodone-acetaminophen (NORCO) 5-325 MG tablet Take 1 tablet by mouth every 8 (eight) hours as needed for moderate pain. 30 tablet 0   ibuprofen (ADVIL) 600 MG tablet Take 1 tablet (600 mg total) by mouth every 6 (six) hours as needed for moderate pain. For AFTER surgery only 30 tablet 0   Ketotifen Fumarate (ALLERGY EYE DROPS OP) Place 1 drop into both eyes 2 (two) times daily as needed (allergies).      losartan (COZAAR) 50 MG tablet Take 1 tablet (50 mg total) by mouth in the morning and at bedtime. 60 tablet 1   meloxicam (MOBIC) 15 MG tablet TAKE 1 TABLET (15 MG TOTAL) BY MOUTH DAILY. 30 tablet 0   Multiple Vitamin (MULTIVITAMIN WITH MINERALS) TABS tablet Take 1 tablet by mouth daily.  venlafaxine XR (EFFEXOR-XR) 37.5 MG 24 hr capsule TAKE 1 CAPSULE BY MOUTH DAILY WITH BREAKFAST. 90 capsule 2   No current facility-administered medications for this visit.    PHYSICAL EXAMINATION: ECOG PERFORMANCE STATUS: {CHL ONC ECOG [phone]}  There were no vitals filed for this visit. There were no vitals filed for this visit.  BREAST:*** No palpable masses or nodules in either right or left breasts. No palpable axillary supraclavicular or infraclavicular adenopathy no breast tenderness or nipple discharge. (exam performed in the  presence of a chaperone)  LABORATORY DATA:  I have reviewed the data as listed    Latest Ref Rng & Units 08/04/2021   11:12 AM 06/17/2020   10:45 AM 07/26/2019   11:28 AM  CMP  Glucose 65 - 99 mg/dL 78  409  811   BUN 7 - 25 mg/dL 12  12  12    Creatinine 0.50 - 1.05 mg/dL 9.14  7.82  9.56   Sodium 135 - 146 mmol/L 131  133  130   Potassium 3.5 - 5.3 mmol/L 4.3  4.5  4.2   Chloride 98 - 110 mmol/L 93  96  93   CO2 20 - 32 mmol/L 28  29  29    Calcium 8.6 - 10.4 mg/dL 21.3  9.6  9.7   Total Protein 6.1 - 8.1 g/dL 7.1  7.0    Total Bilirubin 0.2 - 1.2 mg/dL 0.6  0.5    AST 10 - 35 U/L 33  25    ALT 6 - 29 U/L 26  19      Lab Results  Component Value Date   WBC 6.8 10/22/2021   HGB 12.5 10/22/2021   HCT 36.1 10/22/2021   MCV 102.3 (H) 10/22/2021   PLT 341 10/22/2021   NEUTROABS 4,168 10/22/2021    ASSESSMENT & PLAN:  No problem-specific Assessment & Plan notes found for this encounter.    No orders of the defined types were placed in this encounter.  The patient has a good understanding of the overall plan. she agrees with it. she will call with any problems that may develop before the next visit here. Total time spent: 30 mins including face to face time and time spent for planning, charting and co-ordination of care   Sherlyn Lick, CMA 04/16/22    I Janan Ridge am acting as a Neurosurgeon for [employer]  ***

## 2022-04-19 ENCOUNTER — Other Ambulatory Visit: Payer: Self-pay

## 2022-04-19 ENCOUNTER — Inpatient Hospital Stay: Payer: Medicare PPO | Attending: Hematology and Oncology | Admitting: Hematology and Oncology

## 2022-04-19 VITALS — BP 122/74 | HR 77 | Temp 97.3°F | Resp 18 | Ht 63.0 in | Wt 117.4 lb

## 2022-04-19 DIAGNOSIS — Z170 Estrogen receptor positive status [ER+]: Secondary | ICD-10-CM

## 2022-04-19 DIAGNOSIS — Z79899 Other long term (current) drug therapy: Secondary | ICD-10-CM | POA: Diagnosis not present

## 2022-04-19 DIAGNOSIS — Z9221 Personal history of antineoplastic chemotherapy: Secondary | ICD-10-CM | POA: Diagnosis not present

## 2022-04-19 DIAGNOSIS — R232 Flushing: Secondary | ICD-10-CM | POA: Insufficient documentation

## 2022-04-19 DIAGNOSIS — Z7982 Long term (current) use of aspirin: Secondary | ICD-10-CM | POA: Insufficient documentation

## 2022-04-19 DIAGNOSIS — C50312 Malignant neoplasm of lower-inner quadrant of left female breast: Secondary | ICD-10-CM | POA: Diagnosis present

## 2022-04-19 DIAGNOSIS — Z79811 Long term (current) use of aromatase inhibitors: Secondary | ICD-10-CM | POA: Insufficient documentation

## 2022-04-19 NOTE — Assessment & Plan Note (Addendum)
Left breast biopsy 8:30 position: IDC grade 1, ER 90%, PR 0%, 50%, HER-2 Negative Ratio 1.21, Irregular Mass Left Breast 1 Cm from Nipple 2.5 x 2 x 2.5 Cm with Distortion,   T2 N0 Stage II a Clinical Stage Oncotype DX score 52, 34% risk of recurrence without chemotherapy   Treatment Summary: 1. Neoadjuvant chemotherapy with dose dense Adriamycin and Cytoxan 4 followed by Abraxane weekly 12; started 07/30/2015 completed 12/16/2015  2. 1/4/17Left Lumpectomy: IDC grade 1, 0.2 cm, Margins Neg, 0/1 LN Neg; ypT1aN0 3. Adjuvant radiation therapy 02/10/16- 03/23/16    Prior treatment: Adjuvant anastrozole 1 mg daily 5-10 years switched to letrozole 11/19/2016 due to musculoskeletal aches and pains, switched to exemestane 03/12/2019 and finally discontinued April 2023 because of hot flashes      Breast cancer surveillance: 1.  Breast exam 04/19/2022: Benign 2.  Mammogram: 09/09/2021: No evidence of malignancy, breast density category C CT abdomen and MRI abdomen: Cystic left adnexal lesion (instructed her to follow-up with her GYN)   Psoriasis: Complete response to immunotherapy treatment.   Return to clinic in 1 year for follow-up

## 2022-04-20 ENCOUNTER — Other Ambulatory Visit: Payer: Self-pay | Admitting: Family Medicine

## 2022-04-20 MED ORDER — HYDROCODONE-ACETAMINOPHEN 5-325 MG PO TABS: 1.0000 | ORAL_TABLET | Freq: Three times a day (TID) | ORAL | 0 refills | Status: DC | PRN

## 2022-04-21 ENCOUNTER — Telehealth: Payer: Self-pay | Admitting: Hematology and Oncology

## 2022-04-21 NOTE — Telephone Encounter (Signed)
Scheduled appointment per 4/15 los. Left voicemail.

## 2022-05-03 ENCOUNTER — Other Ambulatory Visit: Payer: Self-pay

## 2022-05-03 NOTE — Telephone Encounter (Signed)
Prescription Request  05/03/2022  LOV: 10/22/21  What is the name of the medication or equipment? fluticasone (FLONASE) 50 MCG/ACT nasal spray [[id number]]   Have you contacted your pharmacy to request a refill? Yes   Which pharmacy would you like this sent to?  [id number] [place], [place] - [name] AT [address] [place] [address] Phone: [phone] Fax: [phone]    Patient notified that their request is being sent to the clinical staff for review and that they should receive a response within 2 business days.   Please advise at [facility] [phone]

## 2022-05-03 NOTE — Telephone Encounter (Signed)
Prescription Request  05/03/2022  LOV: 10/22/21  What is the name of the medication or equipment? atenolol (TENORMIN) 50 MG tablet [[id number]]  Have you contacted your pharmacy to request a refill? Yes   Which pharmacy would you like this sent to?  [id number] [place], [place] - [address] [facility] [address] [place] [address] Phone: [phone] Fax: [phone]    Patient notified that their request is being sent to the clinical staff for review and that they should receive a response within 2 business days.   Please advise at [facility] [phone]

## 2022-05-04 MED ORDER — ATENOLOL 50 MG PO TABS: 50.0000 mg | ORAL_TABLET | Freq: Every day | ORAL | 0 refills | Status: DC

## 2022-05-04 NOTE — Telephone Encounter (Signed)
Requested Prescriptions  Pending Prescriptions Disp Refills   atenolol (TENORMIN) 50 MG tablet 90 tablet 0    Sig: Take 1 tablet (50 mg total) by mouth daily.     Cardiovascular: Beta Blockers 2 Failed - 05/03/2022 10:14 AM      Failed - Valid encounter within last 6 months    Recent Outpatient Visits           1 year ago Acute viral conjunctivitis of left eye   [facility] [name], NP   1 year ago Pure hypercholesterolemia   [facility] [name], MD   2 years ago SIADH (syndrome of inappropriate ADH production) (HCC)   [facility] Pickard, [name], MD   2 years ago Chronic diarrhea   [facility] [name], [name], MD   4 years ago Great toe pain, right   [facility] Pickard, [name], MD              Passed - Cr in normal range and within 360 days    Creatinine  Date Value Ref Range Status  05/20/2016 0.8 0.6 - 1.1 mg/dL Final   Creat  Date Value Ref Range Status  08/04/2021 0.60 0.50 - 1.05 mg/dL Final         Passed - Last BP in normal range    BP Readings from Last 1 Encounters:  04/19/22 122/74         Passed - Last Heart Rate in normal range    Pulse Readings from Last 1 Encounters:  04/19/22 77

## 2022-05-06 ENCOUNTER — Other Ambulatory Visit: Payer: Self-pay | Admitting: Family Medicine

## 2022-05-06 MED ORDER — HYDROCODONE-ACETAMINOPHEN 5-325 MG PO TABS: 1.0000 | ORAL_TABLET | Freq: Three times a day (TID) | ORAL | 0 refills | Status: DC | PRN

## 2022-05-17 ENCOUNTER — Other Ambulatory Visit: Payer: Self-pay

## 2022-05-17 NOTE — Telephone Encounter (Signed)
Prescription Request  05/17/2022  LOV: 10/22/21  What is the name of the medication or equipment? fluticasone (FLONASE) 50 MCG/ACT nasal spray [[id number]]  Have you contacted your pharmacy to request a refill? Yes   Which pharmacy would you like this sent to?  [id number] [place], [place] - [address] [facility] [address] [place] [address] Phone: [phone] Fax: [phone]    Patient notified that their request is being sent to the clinical staff for review and that they should receive a response within 2 business days.   Please advise at [facility] [phone]

## 2022-05-18 MED ORDER — FLUTICASONE PROPIONATE 50 MCG/ACT NA SUSP: NASAL | 2 refills | Status: DC

## 2022-05-18 NOTE — Telephone Encounter (Signed)
Requested Prescriptions  Pending Prescriptions Disp Refills   fluticasone (FLONASE) 50 MCG/ACT nasal spray 48 mL 2    Sig: SPRAY 2 SPRAYS INTO EACH NOSTRIL EVERY DAY     Ear, Nose, and Throat: Nasal Preparations - Corticosteroids Failed - 05/17/2022  9:37 AM      Failed - Valid encounter within last 12 months    Recent Outpatient Visits           1 year ago Acute viral conjunctivitis of left eye   [facility] [name], NP   1 year ago Pure hypercholesterolemia   [facility] [name], MD   2 years ago SIADH (syndrome of inappropriate ADH production) (HCC)   [facility] [name], MD   2 years ago Chronic diarrhea   [facility] Pickard, [name], MD   4 years ago Great toe pain, right   [facility] Pickard, [name], MD

## 2022-05-22 LAB — COMPREHENSIVE METABOLIC PANEL: EGFR: 84

## 2022-05-23 ENCOUNTER — Other Ambulatory Visit: Payer: Self-pay | Admitting: Family Medicine

## 2022-06-01 ENCOUNTER — Other Ambulatory Visit: Payer: Self-pay | Admitting: Family Medicine

## 2022-06-01 MED ORDER — HYDROCODONE-ACETAMINOPHEN 5-325 MG PO TABS: 1.0000 | ORAL_TABLET | Freq: Three times a day (TID) | ORAL | 0 refills | Status: DC | PRN

## 2022-06-21 ENCOUNTER — Ambulatory Visit: Payer: Medicare PPO | Admitting: Family Medicine

## 2022-06-21 ENCOUNTER — Encounter: Payer: Self-pay | Admitting: Family Medicine

## 2022-06-21 VITALS — BP 130/72 | HR 79 | Temp 98.4°F | Ht 63.0 in | Wt 116.4 lb

## 2022-06-21 DIAGNOSIS — I10 Essential (primary) hypertension: Secondary | ICD-10-CM

## 2022-06-21 DIAGNOSIS — R718 Other abnormality of red blood cells: Secondary | ICD-10-CM

## 2022-06-21 LAB — LIPID PANEL
LDL Cholesterol (Calc): 58 mg/dL (calc)
Non-HDL Cholesterol (Calc): 73 mg/dL (calc) (ref <130)
Total CHOL/HDL Ratio: 1.7 (calc) (ref <5.0)

## 2022-06-21 MED ORDER — AMLODIPINE BESYLATE 5 MG PO TABS: 5.0000 mg | ORAL_TABLET | Freq: Every day | ORAL | 3 refills | Status: DC

## 2022-06-21 NOTE — Progress Notes (Signed)
Subjective:    Patient ID: Kim Ortega, female    DOB: Mar 01, 1955, 67 y.o.   MRN: [id number]  Patient is on Tremfya for psoriasis, psoriatic arthritis, and positive RF with chronic low back pain.  However, due to the patient's report, she states that the rheumatologist does not feel that her back pain is due to psoriatic arthritis.  The patient has not noticed any significant benefit from the medication in controlling her joint pains.  Therefore does assume that her joint pains are related to degenerative joint disease.  However tremfya has helped immensely with her psoriasis.  She is due for lung cancer screening as she continues to smoke.  She declines a CT scan of the lungs.  She is due for the shingles vaccine.  She is due for Prevnar 20.  She declines both of these.  Recently on some labs obtained at her dermatologist, her hemoglobin was borderline low at 11.7 and her MCV was elevated at 102.  She is here today to evaluate this further.  Her last colonoscopy was in 2016. 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 ,grade 1   Episcleritis    Hormone replacement therapy    Hypertension    Left carotid artery stenosis    40-59% (01/2015)   Personal history of chemotherapy 2017   neoadjuvant   Personal history of radiation therapy 2018   Psoriasis    SIADH (syndrome of inappropriate ADH production) (HCC)    Smoker    Past Surgical History:  Procedure Laterality Date   ABDOMINAL HYSTERECTOMY     BREAST BIOPSY Left 06/2015   BREAST LUMPECTOMY Left 01/2016   BREAST LUMPECTOMY WITH RADIOACTIVE SEED AND SENTINEL LYMPH NODE BIOPSY Left 01/08/2016   Procedure: LEFT BREAST LUMPECTOMY WITH RADIOACTIVE SEED AND SENTINEL LYMPH NODE BIOPSY, BLUE DYE INJECTION;  Surgeon: Emelia Loron, MD;  Location: [facility];  Service:  General;  Laterality: Left;   cervical acdf     C5-6- fusion with plates/screws "Dr. Newell Coral" '12   COLONOSCOPY WITH PROPOFOL N/A 04/16/2014   Procedure: COLONOSCOPY WITH PROPOFOL;  Surgeon: Charolett Bumpers, MD;  Location: [facility];  Service: Endoscopy;  Laterality: N/A;   COLPOSCOPY     laporotomy     Ovarian cyst   PORT-A-CATH REMOVAL Right 01/08/2016   Procedure: REMOVAL PORT-A-CATH;  Surgeon: Emelia Loron, MD;  Location: [facility];  Service: General;  Laterality: Right;   PORTACATH PLACEMENT N/A 07/24/2015   Procedure: INSERTION PORT-A-CATH WITH [place];  Surgeon: Emelia Loron, MD;  Location: [facility];  Service: General;  Laterality: N/A;   ROBOTIC ASSISTED SALPINGO OOPHERECTOMY Bilateral 10/10/2018   Procedure: XI ROBOTIC ASSISTED SALPINGO OOPHORECTOMY;  Surgeon: Adolphus Birchwood, MD;  Location: [facility];  Service: Gynecology;  Laterality: Bilateral;   TONSILLECTOMY     child   No Known Allergies Current Outpatient Medications on File Prior to Visit  Medication Sig Dispense Refill   ALPRAZolam (XANAX) 0.5 MG tablet Take 0.5 mg by mouth 3 (three) times daily as needed for anxiety.     aspirin EC 81 MG tablet Take 81 mg by mouth daily.     atenolol (TENORMIN) 50 MG tablet Take 1 tablet (50 mg total) by mouth daily. 90 tablet 0   atorvastatin (LIPITOR) 40 MG tablet  Take 1 tablet (40 mg total) by mouth daily. 90 tablet 3   Calcium Carbonate-Vitamin D (CALCIUM-D PO) Take 1 tablet by mouth 2 (two) times daily.      clobetasol (OLUX) 0.05 % topical foam Apply topically.     cyclobenzaprine (FLEXERIL) 10 MG tablet TAKE 1 TABLET BY MOUTH THREE TIMES A DAY AS NEEDED FOR MUSCLE SPASM 30 tablet 0   fluticasone (FLONASE) 50 MCG/ACT nasal spray SPRAY 2 SPRAYS INTO EACH NOSTRIL EVERY DAY 48 mL 2   Guselkumab (TREMFYA) 100 MG/ML SOPN Inject into the skin. Every 8 weeks     hydrochlorothiazide (HYDRODIURIL) 25 MG tablet Take 1 tablet (25 mg total) by mouth daily. 30 tablet 3    HYDROcodone-acetaminophen (NORCO) 5-325 MG tablet Take 1 tablet by mouth every 8 (eight) hours as needed for moderate pain. 30 tablet 0   ibuprofen (ADVIL) 600 MG tablet Take 1 tablet (600 mg total) by mouth every 6 (six) hours as needed for moderate pain. For AFTER surgery only 30 tablet 0   Ketotifen Fumarate (ALLERGY EYE DROPS OP) Place 1 drop into both eyes 2 (two) times daily as needed (allergies).      losartan (COZAAR) 50 MG tablet Take 1 tablet (50 mg total) by mouth in the morning and at bedtime. 60 tablet 1   meloxicam (MOBIC) 15 MG tablet TAKE 1 TABLET (15 MG TOTAL) BY MOUTH DAILY. 30 tablet 0   Multiple Vitamin (MULTIVITAMIN WITH MINERALS) TABS tablet Take 1 tablet by mouth daily.     venlafaxine XR (EFFEXOR-XR) 37.5 MG 24 hr capsule TAKE 1 CAPSULE BY MOUTH DAILY WITH BREAKFAST. 90 capsule 2   No current facility-administered medications on file prior to visit.   Social History   Socioeconomic History   Marital status: Married    Spouse name: Micheal   Number of children: 0   Years of education: Not on file   Highest education level: Not on file  Occupational History   Not on file  Tobacco Use   Smoking status: Every Day    Packs/day: 0.50    Years: 20.00    Additional pack years: 0.00    Total pack years: 10.00    Types: Cigarettes    Passive exposure: Never   Smokeless tobacco: Never  Vaping Use   Vaping Use: Never used  Substance and Sexual Activity   Alcohol use: Yes    Alcohol/week: 10.0 standard drinks of alcohol    Types: 10 Glasses of wine per week    Comment: Glasses of wine several times a week   Drug use: No   Sexual activity: Yes    Birth control/protection: Surgical    Comment: married  Other Topics Concern   Not on file  Social History Narrative   Married x 25 years.   Family lives nearby.   Social Determinants of Health   Financial Resource Strain: Low Risk  (04/15/2022)   Overall Financial Resource Strain (CARDIA)    Difficulty of Paying  Living Expenses: Not hard at all  Food Insecurity: No Food Insecurity (04/15/2022)   Hunger Vital Sign    Worried About Running Out of Food in the Last Year: Never true    Ran Out of Food in the Last Year: Never true  Transportation Needs: No Transportation Needs (04/15/2022)   PRAPARE - Administrator, Civil Service (Medical): No    Lack of Transportation (Non-Medical): No  Physical Activity: Insufficiently Active (04/15/2022)   Exercise Vital Sign  Days of Exercise per Week: 3 days    Minutes of Exercise per Session: 30 min  Stress: No Stress Concern Present (04/15/2022)   [employer] of Occupational Health - Occupational Stress Questionnaire    Feeling of Stress : Not at all  Social Connections: Socially Integrated (04/15/2022)   Social Connection and Isolation Panel [NHANES]    Frequency of Communication with Friends and Family: More than three times a week    Frequency of Social Gatherings with Friends and Family: Three times a week    Attends Religious Services: More than 4 times per year    Active Member of Clubs or Organizations: Yes    Attends Banker Meetings: More than 4 times per year    Marital Status: Married  Catering manager Violence: Not At Risk (04/15/2022)   Humiliation, Afraid, Rape, and Kick questionnaire    Fear of Current or Ex-Partner: No    Emotionally Abused: No    Physically Abused: No    Sexually Abused: No     Review of Systems  Gastrointestinal:  Positive for diarrhea.  Musculoskeletal:  Positive for back pain.  All other systems reviewed and are negative.      Objective:   Physical Exam Vitals reviewed.  Constitutional:      General: She is not in acute distress.    Appearance: She is well-developed. She is not diaphoretic.  HENT:     Nose: No mucosal edema.     Right Sinus: No maxillary sinus tenderness.     Left Sinus: No maxillary sinus tenderness.  Cardiovascular:     Rate and Rhythm: Normal rate and  regular rhythm.     Heart sounds: Normal heart sounds. No murmur heard. Pulmonary:     Effort: Pulmonary effort is normal. No respiratory distress.     Breath sounds: Normal breath sounds. No wheezing or rales.  Musculoskeletal:     Right elbow: Tenderness present in olecranon process.     Cervical back: Pain with movement present.     Lumbar back: Bony tenderness present. No spasms or tenderness. Decreased range of motion.     Left hip: Decreased range of motion.  Skin:    Findings: No erythema.  Neurological:     Mental Status: She is alert.     Motor: No abnormal muscle tone.           Assessment & Plan:  Benign essential HTN - Plan: Lipid panel, CANCELED: CBC with Differential/Platelet, CANCELED: COMPLETE METABOLIC PANEL WITH GFR  Elevated MCV - Plan: Vitamin B12, Fecal Globin By Immunochemistry Patient's blood pressure today is excellent however her most recent sodium was 131.  She has SIADH.  However the hydrochlorothiazide is likely exacerbating this.  Therefore we have decided to discontinue hydrochlorothiazide and replace with amlodipine 5 mg a day.  I will check a lipid panel.  Given her elevated MCV I will also check a B12 level and check her stool for blood.

## 2022-06-22 ENCOUNTER — Other Ambulatory Visit: Payer: Self-pay | Admitting: Family Medicine

## 2022-06-22 DIAGNOSIS — Z0189 Encounter for other specified special examinations: Secondary | ICD-10-CM | POA: Diagnosis not present

## 2022-06-22 LAB — VITAMIN B12: Vitamin B-12: 457 pg/mL (ref 200–1100)

## 2022-06-22 LAB — LIPID PANEL
Cholesterol: 171 mg/dL (ref <200)
HDL: 98 mg/dL (ref >=50)
Triglycerides: 74 mg/dL (ref <150)

## 2022-06-30 ENCOUNTER — Other Ambulatory Visit: Payer: Self-pay | Admitting: Family Medicine

## 2022-07-01 MED ORDER — HYDROCODONE-ACETAMINOPHEN 5-325 MG PO TABS: 1.0000 | ORAL_TABLET | Freq: Three times a day (TID) | ORAL | 0 refills | Status: DC | PRN

## 2022-07-22 DIAGNOSIS — L821 Other seborrheic keratosis: Secondary | ICD-10-CM | POA: Diagnosis not present

## 2022-07-22 DIAGNOSIS — D239 Other benign neoplasm of skin, unspecified: Secondary | ICD-10-CM | POA: Diagnosis not present

## 2022-07-22 DIAGNOSIS — L400 Psoriasis vulgaris: Secondary | ICD-10-CM | POA: Diagnosis not present

## 2022-07-22 DIAGNOSIS — D225 Melanocytic nevi of trunk: Secondary | ICD-10-CM | POA: Diagnosis not present

## 2022-07-22 DIAGNOSIS — W57XXXA Bitten or stung by nonvenomous insect and other nonvenomous arthropods, initial encounter: Secondary | ICD-10-CM | POA: Diagnosis not present

## 2022-07-22 DIAGNOSIS — L578 Other skin changes due to chronic exposure to nonionizing radiation: Secondary | ICD-10-CM | POA: Diagnosis not present

## 2022-08-03 ENCOUNTER — Other Ambulatory Visit: Payer: Self-pay | Admitting: Family Medicine

## 2022-08-03 ENCOUNTER — Telehealth: Payer: Self-pay | Admitting: Family Medicine

## 2022-08-03 NOTE — Addendum Note (Signed)
Addended by: Arta Silence on: 08/03/2022 12:24 PM   Modules accepted: Orders

## 2022-08-03 NOTE — Telephone Encounter (Signed)
Prescription Request  08/03/2022  LOV: 06/21/2022  What is the name of the medication or equipment?   atenolol (TENORMIN) 50 MG tablet [[id number]]   Have you contacted your pharmacy to request a refill? Yes   Which pharmacy would you like this sent to?  [id number] [place], [place] - [address] AT [facility] [address] [place] [address] Phone: [phone] Fax: [phone]    Patient notified that their request is being sent to the clinical staff for review and that they should receive a response within 2 business days.   Please advise pharmacist.

## 2022-08-04 ENCOUNTER — Encounter: Payer: Self-pay | Admitting: Family Medicine

## 2022-08-04 MED ORDER — CYCLOBENZAPRINE HCL 10 MG PO TABS: ORAL_TABLET | ORAL | 0 refills | Status: DC

## 2022-08-04 NOTE — Telephone Encounter (Signed)
Last OV 06/21/22 Requested Prescriptions  Pending Prescriptions Disp Refills   atorvastatin (LIPITOR) 40 MG tablet [Pharmacy Med Name: ATORVASTATIN 40 MG TABLET] 90 tablet 2    Sig: TAKE 1 TABLET BY MOUTH EVERY DAY     Cardiovascular:  Antilipid - Statins Failed - 08/03/2022 10:20 AM      Failed - Valid encounter within last 12 months    Recent Outpatient Visits           1 year ago Acute viral conjunctivitis of left eye   [facility] [name], NP   2 years ago Pure hypercholesterolemia   [facility] [name], MD   3 years ago SIADH (syndrome of inappropriate ADH production) (HCC)   [facility] Pickard, [name], MD   3 years ago Chronic diarrhea   [facility] Pickard, [name], MD   4 years ago Great toe pain, right   [facility] Pickard, [name], MD              Failed - Lipid Panel in normal range within the last 12 months    Cholesterol  Date Value Ref Range Status  06/21/2022 171 <200 mg/dL Final   LDL Cholesterol (Calc)  Date Value Ref Range Status  06/21/2022 58 mg/dL (calc) Final    Comment:    Reference range: <100 . Desirable range <100 mg/dL for primary prevention;   <70 mg/dL for patients with CHD or diabetic patients  with > or = 2 CHD risk factors. [name] LDL-C is now calculated using the Martin-Hopkins  calculation, which is a validated novel method providing  better accuracy than the Friedewald equation in the  estimation of LDL-C.  Horald Pollen et al. Lenox Ahr. 7829;562(13): 2061-2068  (http://education.[employer].com/faq/FAQ164)    HDL  Date Value Ref Range Status  06/21/2022 98 > OR = 50 mg/dL Final   Triglycerides  Date Value Ref Range Status  06/21/2022 74 <150 mg/dL Final         Passed - Patient is not pregnant

## 2022-08-05 ENCOUNTER — Telehealth: Payer: Self-pay

## 2022-08-05 ENCOUNTER — Other Ambulatory Visit: Payer: Self-pay | Admitting: Family Medicine

## 2022-08-05 MED ORDER — HYDROCODONE-ACETAMINOPHEN 10-325 MG PO TABS: 0.5000 | ORAL_TABLET | Freq: Three times a day (TID) | ORAL | 0 refills | Status: DC | PRN

## 2022-08-05 MED ORDER — HYDROCODONE-ACETAMINOPHEN 5-325 MG PO TABS: 1.0000 | ORAL_TABLET | Freq: Three times a day (TID) | ORAL | 0 refills | Status: DC | PRN

## 2022-08-05 NOTE — Telephone Encounter (Signed)
Patient called to follow up on refill request sent to pharmacy for Hydrocodone. Pharmacy is out of stock. Patient requested for provider to send new script to pharmacy for 10MG  of Hydrocodone and patient will split pills in half. Patient scheduled to go out of town in about 30 minutes.   Please advise pharmacist.   [id number] [place], [place] [phone] [facility] [address] AT [facility] [address] [place] [address] Phone: [phone]  Fax: [phone] DEA #: [id number]

## 2022-08-05 NOTE — Telephone Encounter (Addendum)
Received eFax from pharmacy regarding refill reqeusted for HYDROcodone-acetaminophen (NORCO) 5-325 MG tablet ;  Product backordered/unavailable.   Please advise pharmacist.  [id number] [place], [place] [phone] [facility] [address] AT [facility] [address] [place] [address] Phone: [phone]  Fax: [phone] DEA #: [id number]

## 2022-08-05 NOTE — Telephone Encounter (Signed)
Pt called Victorino Dike and states[facility]. She's leaving to go out of town in about 30 minutes. She asked for a script for 10MG  and she'll split the pills in half. Thank you.

## 2022-08-09 ENCOUNTER — Other Ambulatory Visit: Payer: Self-pay | Admitting: Hematology and Oncology

## 2022-08-09 DIAGNOSIS — Z1231 Encounter for screening mammogram for malignant neoplasm of breast: Secondary | ICD-10-CM

## 2022-08-27 ENCOUNTER — Other Ambulatory Visit: Payer: Self-pay | Admitting: Family Medicine

## 2022-08-30 ENCOUNTER — Other Ambulatory Visit: Payer: Self-pay | Admitting: Family Medicine

## 2022-08-30 NOTE — Telephone Encounter (Signed)
Last OV 06/21/22 within protocol.  Requested Prescriptions  Pending Prescriptions Disp Refills   losartan (COZAAR) 50 MG tablet [Pharmacy Med Name: LOSARTAN POTASSIUM 50 MG TAB] 180 tablet 0    Sig: TAKE 1 TABLET (50 MG) BY MOUTH IN THE MORNING AND AT BEDTIME     Cardiovascular:  Angiotensin Receptor Blockers Failed - 08/27/2022 12:45 PM      Failed - Cr in normal range and within 180 days    Creatinine  Date Value Ref Range Status  05/20/2016 0.8 0.6 - 1.1 mg/dL Final   Creat  Date Value Ref Range Status  08/04/2021 0.60 0.50 - 1.05 mg/dL Final         Failed - K in normal range and within 180 days    Potassium  Date Value Ref Range Status  08/04/2021 4.3 3.5 - 5.3 mmol/L Final  05/20/2016 3.8 3.5 - 5.1 mEq/L Final         Failed - Valid encounter within last 6 months    Recent Outpatient Visits           1 year ago Acute viral conjunctivitis of left eye   [facility] [name], NP   2 years ago Pure hypercholesterolemia   [facility] [name], MD   3 years ago SIADH (syndrome of inappropriate ADH production) (HCC)   [facility] [name], MD   3 years ago Chronic diarrhea   [facility] [name], [name], MD   4 years ago Great toe pain, right   [facility] Pickard, [name], MD              Passed - Patient is not pregnant      Passed - Last BP in normal range    BP Readings from Last 1 Encounters:  06/21/22 130/72

## 2022-08-30 NOTE — Telephone Encounter (Signed)
Prescription Request  08/30/2022  LOV: 06/21/2022  What is the name of the medication or equipment?   atenolol (TENORMIN) 50 MG tablet  - **90 day script requested**  HYDROcodone-acetaminophen (NORCO) 10-325 MG tablet [[id number]]    Have you contacted your pharmacy to request a refill? Yes   Which pharmacy would you like this sent to?  [id number] [place], [place] - [address] [facility] [address] AT [facility] [address] [place] [address] Phone: [phone] Fax: [phone]    Patient notified that their request is being sent to the clinical staff for review and that they should receive a response within 2 business days.   Please advise pharmacistl

## 2022-08-31 ENCOUNTER — Other Ambulatory Visit: Payer: Self-pay | Admitting: Family Medicine

## 2022-08-31 ENCOUNTER — Telehealth: Payer: Self-pay | Admitting: Family Medicine

## 2022-08-31 MED ORDER — HYDROCODONE-ACETAMINOPHEN 10-325 MG PO TABS: 0.5000 | ORAL_TABLET | Freq: Three times a day (TID) | ORAL | 0 refills | Status: DC | PRN

## 2022-08-31 NOTE — Telephone Encounter (Signed)
Prescription Request  08/31/2022  LOV: 06/21/2022  What is the name of the medication or equipment? Hydrochlorothiazide 25 mg  Have you contacted your pharmacy to request a refill? Yes   Which pharmacy would you like this sent to?  [id number] [place], [place] - [address] [facility] [address] AT [facility] [address] [place] [address] Phone: [phone] Fax: [phone]    Patient notified that their request is being sent to the clinical staff for review and that they should receive a response within 2 business days.   Please advise at [facility] [phone]

## 2022-08-31 NOTE — Telephone Encounter (Signed)
Requested medication is not on med list? Pls advise?

## 2022-09-01 NOTE — Telephone Encounter (Signed)
Called spoke w/pt. Per pt is no longer taking this particular medication anymore.

## 2022-09-10 ENCOUNTER — Ambulatory Visit
Admission: RE | Admit: 2022-09-10 | Discharge: 2022-09-10 | Disposition: A | Discharge status: Home / Self Care | Payer: Medicare PPO | Source: Ambulatory Visit | Attending: Hematology and Oncology | Admitting: Hematology and Oncology

## 2022-09-10 DIAGNOSIS — Z1231 Encounter for screening mammogram for malignant neoplasm of breast: Secondary | ICD-10-CM

## 2022-09-15 ENCOUNTER — Ambulatory Visit: Payer: Medicare PPO | Admitting: Family Medicine

## 2022-09-15 VITALS — BP 116/64 | HR 77 | Temp 97.8°F | Ht 63.0 in | Wt 116.0 lb

## 2022-09-15 DIAGNOSIS — Z23 Encounter for immunization: Secondary | ICD-10-CM

## 2022-09-15 DIAGNOSIS — M79641 Pain in right hand: Secondary | ICD-10-CM | POA: Insufficient documentation

## 2022-09-15 NOTE — Progress Notes (Signed)
Subjective:  HPI: Kim Ortega is a 67 y.o. female presenting on 09/15/2022 for Pain (Right hand sore and painful (throbbing). Started Sunday.Put Voltaren on it, made it worse. )   HPI Patient is in today for right hand pain described as throbbing that started 3 days ago. It was initially relieved by ice and Aleve but the pain returned yesterday. Voltaren gel made the pain worse. She did have some bruising and swellig to the back of her hand but does not recall any injury or fall. She reports numbness last night in the tips of all of her fingers and intermittent pain when bending her fingers. The pain was Ok during the day yesterday after ice and Aleve and started worsening yesterday afternoon.    Review of Systems  All other systems reviewed and are negative.   Relevant past medical history reviewed and updated as indicated.   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 ,grade 1   Episcleritis    Hormone replacement therapy    Hypertension    Left carotid artery stenosis    40-59% (01/2015)   Personal history of chemotherapy 2017   neoadjuvant   Personal history of radiation therapy 2018   Psoriasis    SIADH (syndrome of inappropriate ADH production) (HCC)    Smoker      Past Surgical History:  Procedure Laterality Date   ABDOMINAL HYSTERECTOMY     BREAST BIOPSY Left 06/2015   BREAST LUMPECTOMY Left 01/2016   BREAST LUMPECTOMY WITH RADIOACTIVE SEED AND SENTINEL LYMPH NODE BIOPSY Left 01/08/2016   Procedure: LEFT BREAST LUMPECTOMY WITH RADIOACTIVE SEED AND SENTINEL LYMPH NODE BIOPSY, BLUE DYE INJECTION;  Surgeon: Emelia Loron, MD;  Location: [facility];  Service: General;  Laterality: Left;   cervical acdf     C5-6- fusion with plates/screws "Dr. Newell Coral" '12   COLONOSCOPY WITH PROPOFOL N/A 04/16/2014    Procedure: COLONOSCOPY WITH PROPOFOL;  Surgeon: Charolett Bumpers, MD;  Location: [facility];  Service: Endoscopy;  Laterality: N/A;   COLPOSCOPY     laporotomy     Ovarian cyst   PORT-A-CATH REMOVAL Right 01/08/2016   Procedure: REMOVAL PORT-A-CATH;  Surgeon: Emelia Loron, MD;  Location: [facility];  Service: General;  Laterality: Right;   PORTACATH PLACEMENT N/A 07/24/2015   Procedure: INSERTION PORT-A-CATH WITH [place];  Surgeon: Emelia Loron, MD;  Location: [facility];  Service: General;  Laterality: N/A;   ROBOTIC ASSISTED SALPINGO OOPHERECTOMY Bilateral 10/10/2018   Procedure: XI ROBOTIC ASSISTED SALPINGO OOPHORECTOMY;  Surgeon: Adolphus Birchwood, MD;  Location: [facility];  Service: Gynecology;  Laterality: Bilateral;   TONSILLECTOMY     child    Allergies and medications reviewed and updated.   Current Outpatient Medications:    ALPRAZolam (XANAX) 0.5 MG tablet, Take 0.5 mg by mouth 3 (three) times daily as needed for anxiety., Disp: , Rfl:    amLODipine (NORVASC) 5 MG tablet, Take 1 tablet (5 mg total) by mouth daily., Disp: 90 tablet, Rfl: 3   aspirin EC 81 MG tablet, Take 81 mg by mouth daily., Disp: , Rfl:    atenolol (TENORMIN) 50 MG tablet, Take 1 tablet (50 mg total) by mouth daily., Disp: 90 tablet, Rfl: 0   atorvastatin (LIPITOR) 40 MG tablet, TAKE 1 TABLET BY  MOUTH EVERY DAY, Disp: 90 tablet, Rfl: 2   Calcium Carbonate-Vitamin D (CALCIUM-D PO), Take 1 tablet by mouth 2 (two) times daily. , Disp: , Rfl:    clobetasol (OLUX) 0.05 % topical foam, Apply topically., Disp: , Rfl:    cyclobenzaprine (FLEXERIL) 10 MG tablet, TAKE 1 TABLET BY MOUTH THREE TIMES A DAY AS NEEDED FOR MUSCLE SPASM, Disp: 30 tablet, Rfl: 0   fluticasone (FLONASE) 50 MCG/ACT nasal spray, SPRAY 2 SPRAYS INTO EACH NOSTRIL EVERY DAY, Disp: 48 mL, Rfl: 2   Guselkumab (TREMFYA) 100 MG/ML SOPN, Inject into the skin. Every 8 weeks, Disp: , Rfl:    HYDROcodone-acetaminophen (NORCO) 10-325 MG tablet, Take  0.5 tablets by mouth every 8 (eight) hours as needed., Disp: 15 tablet, Rfl: 0   HYDROcodone-acetaminophen (NORCO) 5-325 MG tablet, Take 1 tablet by mouth every 8 (eight) hours as needed for moderate pain., Disp: 30 tablet, Rfl: 0   ibuprofen (ADVIL) 600 MG tablet, Take 1 tablet (600 mg total) by mouth every 6 (six) hours as needed for moderate pain. For AFTER surgery only, Disp: 30 tablet, Rfl: 0   Ketotifen Fumarate (ALLERGY EYE DROPS OP), Place 1 drop into both eyes 2 (two) times daily as needed (allergies). , Disp: , Rfl:    losartan (COZAAR) 50 MG tablet, TAKE 1 TABLET (50 MG) BY MOUTH IN THE MORNING AND AT BEDTIME, Disp: 180 tablet, Rfl: 0   meloxicam (MOBIC) 15 MG tablet, TAKE 1 TABLET (15 MG TOTAL) BY MOUTH DAILY., Disp: 30 tablet, Rfl: 0   Multiple Vitamin (MULTIVITAMIN WITH MINERALS) TABS tablet, Take 1 tablet by mouth daily., Disp: , Rfl:    venlafaxine XR (EFFEXOR-XR) 37.5 MG 24 hr capsule, TAKE 1 CAPSULE BY MOUTH DAILY WITH BREAKFAST., Disp: 90 capsule, Rfl: 2  No Known Allergies  Objective:   BP 116/64   Pulse 77   Temp 97.8 F (36.6 C) (Oral)   Ht 5\' 3"  (1.6 m)   Wt 116 lb (52.6 kg)   SpO2 97%   BMI 20.55 kg/m      09/15/2022    9:31 AM 06/21/2022    9:50 AM 04/19/2022   10:39 AM  Vitals with BMI  Height 5\' 3"  5\' 3"  5\' 3"   Weight 116 lbs 116 lbs 6 oz 117 lbs 6 oz  BMI 20.55 20.62 20.8  Systolic 116 130 161  Diastolic 64 72 74  Pulse 77 79 77     Physical Exam Vitals and nursing note reviewed.  Constitutional:      Appearance: Normal appearance. She is normal weight.  HENT:     Head: Normocephalic and atraumatic.  Musculoskeletal:     Right wrist: Swelling present.     Left wrist: Normal.     Right hand: Tenderness present. No swelling or bony tenderness. Decreased range of motion. Decreased sensation. Normal capillary refill. Normal pulse.     Left hand: Normal.  Skin:    General: Skin is warm and dry.     Findings: Ecchymosis present.        Neurological:     General: No focal deficit present.     Mental Status: She is alert and oriented to person, place, and time. Mental status is at baseline.  Psychiatric:        Mood and Affect: Mood normal.        Behavior: Behavior normal.        Thought Content: Thought content normal.        Judgment: Judgment  normal.     Assessment & Plan:  Right hand pain Assessment & Plan: Patinet is here with right hand pain and no known injury. She does have a large yellow bruise to the posterior aspect of her right hand as well as swelling and erythema to her right anterior wrist. Negative Phalen and Tinels. Will order right hand x-ray. May continue Aleve up to 500mg  every 12 hours. Return to office if symptoms persist or worsen.  Orders: -     DG Hand Complete Right; Future     Follow up plan: Return if symptoms worsen or fail to improve.  Park Meo, FNP

## 2022-09-15 NOTE — Assessment & Plan Note (Signed)
Patinet is here with right hand pain and no known injury. She does have a large yellow bruise to the posterior aspect of her right hand as well as swelling and erythema to her right anterior wrist. Negative Phalen and Tinels. Will order right hand x-ray. May continue Aleve up to 500mg  every 12 hours. Return to office if symptoms persist or worsen.

## 2022-09-16 ENCOUNTER — Ambulatory Visit
Admission: RE | Admit: 2022-09-16 | Discharge: 2022-09-16 | Disposition: A | Discharge status: Home / Self Care | Payer: Medicare PPO | Source: Ambulatory Visit | Attending: Family Medicine | Admitting: Family Medicine

## 2022-09-16 DIAGNOSIS — M79641 Pain in right hand: Secondary | ICD-10-CM | POA: Diagnosis not present

## 2022-09-16 DIAGNOSIS — R2231 Localized swelling, mass and lump, right upper limb: Secondary | ICD-10-CM | POA: Diagnosis not present

## 2022-09-16 DIAGNOSIS — Z23 Encounter for immunization: Secondary | ICD-10-CM

## 2022-09-16 DIAGNOSIS — M25541 Pain in joints of right hand: Secondary | ICD-10-CM | POA: Diagnosis not present

## 2022-09-16 NOTE — Addendum Note (Signed)
Addended by: Arta Silence on: 09/16/2022 09:09 AM   Modules accepted: Orders

## 2022-09-17 LAB — FECAL GLOBIN BY IMMUNOCHEMISTRY
FECAL GLOBIN RESULT:: NOT DETECTED
MICRO NUMBER:: 15106929
SPECIMEN QUALITY:: ADEQUATE

## 2022-09-17 LAB — HOUSE ACCOUNT TRACKING

## 2022-09-18 ENCOUNTER — Other Ambulatory Visit: Payer: Self-pay | Admitting: Hematology and Oncology

## 2022-09-20 ENCOUNTER — Other Ambulatory Visit: Payer: Self-pay

## 2022-09-20 MED ORDER — ATENOLOL 50 MG PO TABS: 50.0000 mg | ORAL_TABLET | Freq: Every day | ORAL | 0 refills | Status: DC

## 2022-09-20 NOTE — Addendum Note (Signed)
Addended by: Arta Silence on: 09/20/2022 12:31 PM   Modules accepted: Orders

## 2022-09-20 NOTE — Addendum Note (Signed)
Addended by: Nena Polio on: 09/20/2022 12:32 PM   Modules accepted: Orders

## 2022-09-20 NOTE — Telephone Encounter (Signed)
Prescription Request  09/20/2022  LOV: 06/21/22  What is the name of the medication or equipment? atenolol (TENORMIN) 50 MG tablet [[id number]]  Have you contacted your pharmacy to request a refill? Yes   Which pharmacy would you like this sent to?  [id number] [place], [place] - [address] [place] [address] Phone: [phone] Fax: [phone]    Patient notified that their request is being sent to the clinical staff for review and that they should receive a response within 2 business days.   Please advise at [facility] [phone]

## 2022-09-21 NOTE — Telephone Encounter (Signed)
Already refilled today

## 2022-09-27 ENCOUNTER — Other Ambulatory Visit: Payer: Self-pay | Admitting: Family Medicine

## 2022-09-28 ENCOUNTER — Ambulatory Visit: Payer: Medicare PPO | Admitting: Family Medicine

## 2022-09-28 VITALS — BP 126/82 | HR 82 | Temp 97.9°F | Ht 63.0 in | Wt 115.6 lb

## 2022-09-28 DIAGNOSIS — M79641 Pain in right hand: Secondary | ICD-10-CM

## 2022-09-28 MED ORDER — HYDROCODONE-ACETAMINOPHEN 5-325 MG PO TABS: 1.0000 | ORAL_TABLET | Freq: Three times a day (TID) | ORAL | 0 refills | Status: DC | PRN

## 2022-09-28 MED ORDER — PREDNISONE 20 MG PO TABS: ORAL_TABLET | ORAL | 0 refills | Status: DC

## 2022-09-28 MED ORDER — METHYLPREDNISOLONE ACETATE 80 MG/ML IJ SUSP
60.0000 mg | Freq: Once | INTRAMUSCULAR | Status: AC
Start: 2022-09-28 — End: 2022-09-28
  Administered 2022-09-28: 60 mg via INTRAMUSCULAR

## 2022-09-28 NOTE — Progress Notes (Signed)
Subjective:    Patient ID: Kim Ortega, female    DOB: 11/12/1955, 67 y.o.   MRN: [id number]  I recently referred the patient back to her rheumatologist due to elevated rheumatoid factor.  Her CCP level was normal and based on her history, rheumatology did not feel that she had rheumatoid arthritis.  Recently the patient has developed significant pain and swelling in her right wrist.  Today the right wrist is visibly swollen.  It is also erythematous in appearance and there is an effusion in the joint.  Patient has pain to proportion to exam to gentle touch.  She is unable to bend or extend her wrist without significant pain.  She does have a family history of gout but she has never had gout. 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 ,grade 1   Episcleritis    Hormone replacement therapy    Hypertension    Left carotid artery stenosis    40-59% (01/2015)   Personal history of chemotherapy 2017   neoadjuvant   Personal history of radiation therapy 2018   Psoriasis    SIADH (syndrome of inappropriate ADH production) (HCC)    Smoker    Past Surgical History:  Procedure Laterality Date   ABDOMINAL HYSTERECTOMY     BREAST BIOPSY Left 06/2015   BREAST LUMPECTOMY Left 01/2016   BREAST LUMPECTOMY WITH RADIOACTIVE SEED AND SENTINEL LYMPH NODE BIOPSY Left 01/08/2016   Procedure: LEFT BREAST LUMPECTOMY WITH RADIOACTIVE SEED AND SENTINEL LYMPH NODE BIOPSY, BLUE DYE INJECTION;  Surgeon: Emelia Loron, MD;  Location: [facility];  Service: General;  Laterality: Left;   cervical acdf     C5-6- fusion with plates/screws "Dr. Newell Coral" '12   COLONOSCOPY WITH PROPOFOL N/A 04/16/2014   Procedure: COLONOSCOPY WITH PROPOFOL;  Surgeon: Charolett Bumpers, MD;  Location: [facility];  Service: Endoscopy;  Laterality: N/A;   COLPOSCOPY      laporotomy     Ovarian cyst   PORT-A-CATH REMOVAL Right 01/08/2016   Procedure: REMOVAL PORT-A-CATH;  Surgeon: Emelia Loron, MD;  [facility];  Service: General;  Laterality: Right;   PORTACATH PLACEMENT N/A 07/24/2015   Procedure: INSERTION PORT-A-CATH WITH [place];  Surgeon: Emelia Loron, MD;  Location: [facility];  Service: General;  Laterality: N/A;   ROBOTIC ASSISTED SALPINGO OOPHERECTOMY Bilateral 10/10/2018   Procedure: XI ROBOTIC ASSISTED SALPINGO OOPHORECTOMY;  Surgeon: Adolphus Birchwood, MD;  Location: [facility];  Service: Gynecology;  Laterality: Bilateral;   TONSILLECTOMY     child   No Known Allergies Current Outpatient Medications on File Prior to Visit  Medication Sig Dispense Refill   ALPRAZolam (XANAX) 0.5 MG tablet Take 0.5 mg by mouth 3 (three) times daily as needed for anxiety.     amLODipine (NORVASC) 5 MG tablet Take 1 tablet (5 mg total) by mouth daily. 90 tablet 3   aspirin EC 81 MG tablet Take 81 mg by mouth daily.     atenolol (TENORMIN) 50 MG tablet Take 1 tablet (50 mg total) by mouth daily. Courtesy refill. Cpe needed for future refills 30 tablet 0   atorvastatin (LIPITOR) 40 MG tablet TAKE 1 TABLET BY MOUTH EVERY DAY 90 tablet 2   Calcium Carbonate-Vitamin D (CALCIUM-D PO) Take 1 tablet by mouth 2 (two) times daily.  clobetasol (OLUX) 0.05 % topical foam Apply topically.     cyclobenzaprine (FLEXERIL) 10 MG tablet TAKE 1 TABLET BY MOUTH THREE TIMES A DAY AS NEEDED FOR MUSCLE SPASM 30 tablet 0   fluticasone (FLONASE) 50 MCG/ACT nasal spray SPRAY 2 SPRAYS INTO EACH NOSTRIL EVERY DAY 48 mL 2   Guselkumab (TREMFYA) 100 MG/ML SOPN Inject into the skin. Every 8 weeks     ibuprofen (ADVIL) 600 MG tablet Take 1 tablet (600 mg total) by mouth every 6 (six) hours as needed for moderate pain. For AFTER surgery only 30 tablet 0   Ketotifen Fumarate (ALLERGY EYE DROPS OP) Place 1 drop into both eyes 2 (two) times daily as needed (allergies).       losartan (COZAAR) 50 MG tablet TAKE 1 TABLET (50 MG) BY MOUTH IN THE MORNING AND AT BEDTIME 180 tablet 0   meloxicam (MOBIC) 15 MG tablet TAKE 1 TABLET (15 MG TOTAL) BY MOUTH DAILY. 30 tablet 0   Multiple Vitamin (MULTIVITAMIN WITH MINERALS) TABS tablet Take 1 tablet by mouth daily.     venlafaxine XR (EFFEXOR-XR) 37.5 MG 24 hr capsule TAKE 1 CAPSULE BY MOUTH DAILY WITH BREAKFAST. 90 capsule 2   No current facility-administered medications on file prior to visit.   Social History   Socioeconomic History   Marital status: Married    Spouse name: Micheal   Number of children: 0   Years of education: Not on file   Highest education level: Not on file  Occupational History   Not on file  Tobacco Use   Smoking status: Every Day    Current packs/day: 0.50    Average packs/day: 0.5 packs/day for 20.0 years (10.0 ttl pk-yrs)    Types: Cigarettes    Passive exposure: Never   Smokeless tobacco: Never  Vaping Use   Vaping status: Never Used  Substance and Sexual Activity   Alcohol use: Yes    Alcohol/week: 10.0 standard drinks of alcohol    Types: 10 Glasses of wine per week    Comment: Glasses of wine several times a week   Drug use: No   Sexual activity: Yes    Birth control/protection: Surgical    Comment: married  Other Topics Concern   Not on file  Social History Narrative   Married x 25 years.   Family lives nearby.   Social Determinants of Health   Financial Resource Strain: Low Risk  (04/15/2022)   Overall Financial Resource Strain (CARDIA)    Difficulty of Paying Living Expenses: Not hard at all  Food Insecurity: No Food Insecurity (04/15/2022)   Hunger Vital Sign    Worried About Running Out of Food in the Last Year: Never true    Ran Out of Food in the Last Year: Never true  Transportation Needs: No Transportation Needs (04/15/2022)   PRAPARE - Administrator, Civil Service (Medical): No    Lack of Transportation (Non-Medical): No  Physical Activity:  Insufficiently Active (04/15/2022)   Exercise Vital Sign    Days of Exercise per Week: 3 days    Minutes of Exercise per Session: 30 min  Stress: No Stress Concern Present (04/15/2022)   [employer] of Occupational Health - Occupational Stress Questionnaire    Feeling of Stress : Not at all  Social Connections: Socially Integrated (04/15/2022)   Social Connection and Isolation Panel [NHANES]    Frequency of Communication with Friends and Family: More than three times a week    Frequency of  Social Gatherings with Friends and Family: Three times a week    Attends Religious Services: More than 4 times per year    Active Member of Clubs or Organizations: Yes    Attends Banker Meetings: More than 4 times per year    Marital Status: Married  Catering manager Violence: Not At Risk (04/15/2022)   Humiliation, Afraid, Rape, and Kick questionnaire    Fear of Current or Ex-Partner: No    Emotionally Abused: No    Physically Abused: No    Sexually Abused: No     Review of Systems  Gastrointestinal:  Positive for diarrhea.  Musculoskeletal:  Positive for back pain.  All other systems reviewed and are negative.      Objective:   Physical Exam Vitals reviewed.  Constitutional:      General: She is not in acute distress.    Appearance: She is well-developed. She is not diaphoretic.  HENT:     Nose: No mucosal edema.     Right Sinus: No maxillary sinus tenderness.     Left Sinus: No maxillary sinus tenderness.  Cardiovascular:     Rate and Rhythm: Normal rate and regular rhythm.     Heart sounds: Normal heart sounds. No murmur heard. Pulmonary:     Effort: Pulmonary effort is normal. No respiratory distress.     Breath sounds: Normal breath sounds. No wheezing or rales.  Musculoskeletal:     Right wrist: Swelling, effusion, tenderness and bony tenderness present. No deformity. Decreased range of motion.     Cervical back: Pain with movement present.     Lumbar back:  Bony tenderness present. No spasms or tenderness. Decreased range of motion.     Left hip: Decreased range of motion.  Skin:    Findings: No erythema.  Neurological:     Mental Status: She is alert.     Motor: No abnormal muscle tone.           Assessment & Plan:  Right hand pain - Plan: CBC with Differential/Platelet, Rheumatoid factor, Sedimentation rate, Uric acid I reviewed the recent x-ray that showed no acute abnormalities.  Based on the pain, the swelling and the erythema, this looks like gout or an autoimmune arthritis.  Patient received Depo-Medrol 60 mg IM x 1 now begin prednisone taper pack tomorrow.  Check CBC to evaluate for leukocytosis, repeat rheumatoid factor and sedimentation rate along with a uric acid to determine the etiology of the inflammation.  I did refill the patient's hydrocodone today.

## 2022-09-29 LAB — CBC WITH DIFFERENTIAL/PLATELET
Absolute Monocytes: 905 cells/uL (ref 200–950)
Basophils Absolute: 42 cells/uL (ref 0–200)
Basophils Relative: 0.4 %
Eosinophils Absolute: 73 cells/uL (ref 15–500)
Eosinophils Relative: 0.7 %
HCT: 38 % (ref 35.0–45.0)
Hemoglobin: 12.7 g/dL (ref 11.7–15.5)
Lymphs Abs: 1778 cells/uL (ref 850–3900)
MCH: 35.3 pg — ABNORMAL HIGH (ref 27.0–33.0)
MCHC: 33.4 g/dL (ref 32.0–36.0)
MCV: 105.6 fL — ABNORMAL HIGH (ref 80.0–100.0)
MPV: 9.4 fL (ref 7.5–12.5)
Monocytes Relative: 8.7 %
Neutro Abs: 7602 cells/uL (ref 1500–7800)
Neutrophils Relative %: 73.1 %
Platelets: 339 10*3/uL (ref 140–400)
RBC: 3.6 10*6/uL — ABNORMAL LOW (ref 3.80–5.10)
RDW: 11.7 % (ref 11.0–15.0)
Total Lymphocyte: 17.1 %
WBC: 10.4 10*3/uL (ref 3.8–10.8)

## 2022-09-29 LAB — RHEUMATOID FACTOR: Rheumatoid fact SerPl-aCnc: 26 IU/mL — ABNORMAL HIGH (ref <14)

## 2022-09-29 LAB — URIC ACID: Uric Acid, Serum: 3.7 mg/dL (ref 2.5–7.0)

## 2022-09-29 LAB — SEDIMENTATION RATE: Sed Rate: 38 mm/h — ABNORMAL HIGH (ref 0–30)

## 2022-10-11 DIAGNOSIS — Z6820 Body mass index (BMI) 20.0-20.9, adult: Secondary | ICD-10-CM | POA: Diagnosis not present

## 2022-10-11 DIAGNOSIS — R87610 Atypical squamous cells of undetermined significance on cytologic smear of cervix (ASC-US): Secondary | ICD-10-CM | POA: Diagnosis not present

## 2022-10-11 DIAGNOSIS — Z1272 Encounter for screening for malignant neoplasm of vagina: Secondary | ICD-10-CM | POA: Diagnosis not present

## 2022-10-11 DIAGNOSIS — Z779 Other contact with and (suspected) exposures hazardous to health: Secondary | ICD-10-CM | POA: Diagnosis not present

## 2022-10-11 DIAGNOSIS — Z853 Personal history of malignant neoplasm of breast: Secondary | ICD-10-CM | POA: Diagnosis not present

## 2022-10-11 DIAGNOSIS — F419 Anxiety disorder, unspecified: Secondary | ICD-10-CM | POA: Diagnosis not present

## 2022-10-18 ENCOUNTER — Telehealth: Payer: Self-pay | Admitting: Family Medicine

## 2022-10-18 ENCOUNTER — Other Ambulatory Visit: Payer: Self-pay | Admitting: Family Medicine

## 2022-10-18 ENCOUNTER — Telehealth: Payer: Self-pay

## 2022-10-18 ENCOUNTER — Other Ambulatory Visit: Payer: Self-pay

## 2022-10-18 DIAGNOSIS — I10 Essential (primary) hypertension: Secondary | ICD-10-CM

## 2022-10-18 MED ORDER — HYDROCHLOROTHIAZIDE 25 MG PO TABS: 25.0000 mg | ORAL_TABLET | Freq: Every day | ORAL | 1 refills | Status: DC

## 2022-10-18 NOTE — Telephone Encounter (Signed)
Pt called in wanting to ask nurse/pcp if it was too soon to come back in for another prednisone shot for her arthritis. Pt states that she is still in pain. Pt stated that once the shot wore off the pain returned. Please advise.  Cb#: [phone]

## 2022-10-18 NOTE — Progress Notes (Unsigned)
Office Visit Note  Patient: Kim Ortega             Date of Birth: 08-06-1955           MRN: [id number]             PCP: [name], MD Referring: [name], MD Visit Date: 10/19/2022 Occupation: @GUAROCC @  Subjective:  Right wrist pain    History of Present Illness: Kim Ortega is a 67 y.o. female with psoriasis, osteoarthritis and positive rheumatoid factor.  According the patient about 2 weeks ago she developed pain and swelling of her right wrist joint.  As she was evaluated by her PCP and was given a prednisone taper which resolved the swelling but then the pain and swelling recurred.  She states she is also has been experiencing some discomfort in her shoulders and her hips.  She continues to have some pain and swelling in her right wrist.  She is still on Tremfya for psoriasis by her dermatologist.  He continues to have some neck and lower back pain intermittently.  She had no recurrence of psoriasis.  She gets a few lesions on her scalp.    Activities of Daily Living:  Patient reports morning stiffness for several hours.   Patient Reports nocturnal pain.  Difficulty dressing/grooming: Reports Difficulty climbing stairs: Denies Difficulty getting out of chair: Denies Difficulty using hands for taps, buttons, cutlery, and/or writing: Reports  Review of Systems  Constitutional:  Negative for fatigue.  HENT:  Negative for mouth sores and mouth dryness.   Eyes:  Negative for dryness.  Respiratory:  Negative for shortness of breath and wheezing.   Cardiovascular:  Negative for chest pain and palpitations.  Gastrointestinal:  Negative for blood in stool, constipation and diarrhea.  Endocrine: Negative for increased urination.  Genitourinary:  Negative for involuntary urination.  Musculoskeletal:  Positive for joint pain, joint pain, joint swelling and morning stiffness. Negative for gait problem, myalgias, muscle weakness, muscle tenderness and  myalgias.  Skin:  Negative for color change, rash, hair loss and sensitivity to sunlight.  Allergic/Immunologic: Negative for susceptible to infections.  Neurological:  Negative for dizziness and headaches.  Hematological:  Negative for swollen glands.  Psychiatric/Behavioral:  Positive for sleep disturbance. Negative for depressed mood. The patient is not nervous/anxious.     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   Antineoplastic chemotherapy induced anemia 08/27/2015   Breast cancer of lower-inner quadrant of left female breast (HCC) 07/03/2015   Left carotid artery stenosis    Psoriasis    Hormone replacement therapy    Smoker    Episcleritis     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 ,grade 1   Episcleritis    Hormone replacement therapy    Hypertension    Left carotid artery stenosis    40-59% (01/2015)   Personal history of chemotherapy 2017  neoadjuvant   Personal history of radiation therapy 2018   Psoriasis    SIADH (syndrome of inappropriate ADH production) (HCC)    Smoker     Family History  Problem Relation Age of Onset   Stroke Mother    Past Surgical History:  Procedure Laterality Date   ABDOMINAL HYSTERECTOMY     BREAST BIOPSY Left 06/2015   BREAST LUMPECTOMY Left 01/2016   BREAST LUMPECTOMY WITH RADIOACTIVE SEED AND SENTINEL LYMPH NODE BIOPSY Left 01/08/2016   Procedure: LEFT BREAST LUMPECTOMY WITH  RADIOACTIVE SEED AND SENTINEL LYMPH NODE BIOPSY, BLUE DYE INJECTION;  Surgeon: Emelia Loron, MD;  Location: [facility];  Service: General;  Laterality: Left;   cervical acdf     C5-6- fusion with plates/screws "Dr. Newell Coral" '12   COLONOSCOPY WITH PROPOFOL N/A 04/16/2014   Procedure: COLONOSCOPY WITH PROPOFOL;  Surgeon: Charolett Bumpers, MD;  Location: [facility];  Service: Endoscopy;  Laterality: N/A;   COLPOSCOPY     laporotomy     Ovarian cyst   PORT-A-CATH REMOVAL Right 01/08/2016   Procedure: REMOVAL PORT-A-CATH;  Surgeon: Emelia Loron, MD;  Location: [facility];  Service: General;  Laterality: Right;   PORTACATH PLACEMENT N/A 07/24/2015   Procedure: INSERTION PORT-A-CATH WITH [place];  Surgeon: Emelia Loron, MD;  Location: [facility];  Service: General;  Laterality: N/A;   ROBOTIC ASSISTED SALPINGO OOPHERECTOMY Bilateral 10/10/2018   Procedure: XI ROBOTIC ASSISTED SALPINGO OOPHORECTOMY;  Surgeon: Adolphus Birchwood, MD;  Location: [facility];  Service: Gynecology;  Laterality: Bilateral;   TONSILLECTOMY     child   Social History   Social History Narrative   Married x 25 years.   Family lives nearby.   Immunization History  Administered Date(s) Administered   Influenza Split 11/04/2012   Influenza, Seasonal, Injecte, Preservative Fre 09/16/2022   Influenza,inj,Quad PF,6+ Mos 10/25/2016, 09/20/2018   Influenza-Unspecified 11/04/2013   MODERNA COVID-19 SARS-COV-2 PEDS BIVALENT BOOSTER 28yr-87yr 03/27/2019, 04/24/2019   Pneumococcal Polysaccharide-23 10/25/2016   Tdap 07/04/2009     Objective: Vital Signs: BP 120/77 (BP Location: Right Arm, Patient Position: Sitting, Cuff Size: Normal)   Pulse 76   Resp 17   Ht 5\' 3"  (1.6 m)   Wt 116 lb 3.2 oz (52.7 kg)   BMI 20.58 kg/m    Physical Exam Vitals and nursing note reviewed.  Constitutional:      Appearance: She is well-developed.  HENT:     Head: Normocephalic and atraumatic.  Eyes:      Conjunctiva/sclera: Conjunctivae normal.  Cardiovascular:     Rate and Rhythm: Normal rate and regular rhythm.     Heart sounds: Normal heart sounds.  Pulmonary:     Effort: Pulmonary effort is normal.     Breath sounds: Normal breath sounds.  Abdominal:     General: Bowel sounds are normal.     Palpations: Abdomen is soft.  Musculoskeletal:     Cervical back: Normal range of motion.  Lymphadenopathy:     Cervical: No cervical adenopathy.  Skin:    General: Skin is warm and dry.     Capillary Refill: Capillary refill takes less than 2 seconds.  Neurological:     Mental Status: She is alert and oriented to person, place, and time.  Psychiatric:        Behavior: Behavior normal.      Musculoskeletal Exam: She has a limitation with lateral rotation of the cervical spine.  There was no tenderness over thoracic or lumbar spine.  Shoulder joints and elbows were in good range of motion.  She had limited extension flexion of her right wrist joint with some extensor tenosynovitis and tenderness over the left wrist joint.  There was no swelling over MCPs PIPs or DIPs.  Hip joints and knee joints were in good range of motion.  There was no tenderness over ankles or MTPs.  There was no Achilles tendinitis or plantar fasciitis.  CDAI Exam: CDAI Score: -- Patient Global: --; Provider Global: -- Swollen: --; Tender: -- Joint Exam 10/19/2022   No joint exam has been documented for this visit   There is currently no information documented on the homunculus. Go to the Rheumatology activity and complete the homunculus joint exam.  Investigation: No additional findings.  Imaging: No results found.  Recent Labs: Lab Results  Component Value Date   WBC 10.4 09/28/2022   HGB 12.7 09/28/2022   PLT 339 09/28/2022   NA 131 (L) 08/04/2021   K 4.3 08/04/2021   CL 93 (L) 08/04/2021   CO2 28 08/04/2021   GLUCOSE 78 08/04/2021   BUN 12 08/04/2021   CREATININE 0.60 08/04/2021   BILITOT 0.6  08/04/2021   ALKPHOS 77 03/12/2019   AST 33 08/04/2021   ALT 26 08/04/2021   PROT 7.1 08/04/2021   ALBUMIN 4.2 03/12/2019   CALCIUM 10.0 08/04/2021   GFRAA 100 06/17/2020    September 28, 2022 sed rate 38, RF 26, uric acid 3.7 August 07, 2019 chest x-ray consistent with COPD. September 16, 2022 x-ray of the right hand negative.  Speciality Comments: No specialty comments available.  Procedures:  No procedures performed Allergies: Patient has no known allergies.   Assessment / Plan:     Visit Diagnoses: Psoriatic arthropathy (HCC)-patient has developed new onset joint swelling and synovitis.  She had recent prednisone taper which helped.  She had recurrence of swelling after finishing prednisone taper.  She had synovitis and tenosynovitis over her right wrist.  None of the other joints were swollen.  She gives history of intermittent discomfort in her shoulders and hips.  There was no discomfort with range of motion today.  Detailed counsel regarding Ro psoriatic arthritis was provided.  I also reviewed x-rays of her hands obtained recently which were unremarkable except for osteoarthritis.  Different treatment options and their side effects were discussed.  Patient states she recalls taking methotrexate many years ago.  I plan to obtain labs today.  Once the labs are available we can start her on methotrexate starting at 4 tablets p.o. weekly along with folic acid 1 mg p.o. daily and increase the methotrexate dose to 6 tablets p.o. weekly.  I will give her prednisone as a bridging therapy.  She recently had prednisone.  Will start her on prednisone 10 mg p.o. daily and taper by 2.5 mg every week until she gets off the prednisone.  Will check labs in 2 weeks x 2 and then every 3 months.  Drug Counseling TB Gold: Pending Hepatitis panel: Pending  Chest-xray: August 07, 2019 consistent with COPD  Contraception: Not applicable  Alcohol use: None  Patient was counseled on the purpose,  proper use, and adverse effects of methotrexate including nausea, infection, and signs and symptoms of pneumonitis.  Reviewed instructions with patient to take methotrexate weekly along with folic acid daily.  Discussed the importance of frequent monitoring of kidney and liver function and blood counts, and provided patient with standing lab instructions.  Counseled patient to avoid NSAIDs and alcohol while  on methotrexate.  Provided patient with educational materials on methotrexate and answered all questions.  Advised patient to get annual influenza vaccine and to get a pneumococcal vaccine if patient has not already had one.  Patient voiced understanding.  Patient consented to methotrexate use.  Will upload into chart.     Psoriasis - she is on Tremfya since January 2023 by Dr. Emily Filbert with 99% skin clearance.  She uses clobetasol foam on the scalp.  High risk medication use -anticipation to start her immunosuppressive therapy will obtain following labs.  She had a chest x-ray in August 2021 which was unremarkable except for COPD.  Plan: Hepatitis B core antibody, IgM, Hepatitis B surface antigen, Hepatitis C antibody, QuantiFERON-TB Gold Plus, Serum protein electrophoresis with reflex, IgG, IgA, IgM, COMPLETE METABOLIC PANEL WITH GFR.  Information on immunization was placed in the AVS.  She was advised to hold methotrexate for a week after vaccines.  She was also advised to stop methotrexate if she develops an infection resume after the infection resolves.  Rheumatoid factor positive - Rheumatoid factor 24, anti-CCP negative, normal, CRP negative.  Episcleritis of left eye-she had an episode more than 20 years ago without any recurrence.  Pain in both hands -she has been experiencing pain and swelling in her right wrist and had limited range of motion of her right wrist joint today.  No synovitis was noted over MCPs PIPs or DIPs.  Recent x-rays were reviewed as mentioned above.  X-rays were consistent  with osteoarthritis.  Trochanteric bursitis of both hips -she has intermittent discomfort in her bilateral trochanteric region.  No tenderness was elicited today.   DDD (degenerative disc disease), cervical - Status post fusion by Dr. Newell Coral.  She has limited range of motion.  Spondylosis of lumbar spine -she complains of chronic lower back discomfort.  MRI January 12, 2021 showed mild degeneration with foraminal stenosis.  Epidural injection by Dr. Alvester Morin in April 2023 relieved pain.  Other medical problems are listed as follows:  Malignant neoplasm of lower-inner quadrant of left breast in female, estrogen receptor positive [facility]) - dxd June 2017.  Chemotherapy-induced peripheral neuropathy (HCC)  Antineoplastic chemotherapy induced anemia  Hormone replacement therapy  SIADH (syndrome of inappropriate ADH production) (HCC)  Left carotid artery stenosis  Smoker-association of smoking with autoimmune disease was discussed.  Smoking cessation was discussed.  Orders: Orders Placed This Encounter  Procedures   Hepatitis B core antibody, IgM   Hepatitis B surface antigen   Hepatitis C antibody   QuantiFERON-TB Gold Plus   Serum protein electrophoresis with reflex   IgG, IgA, IgM   COMPLETE METABOLIC PANEL WITH GFR   Meds ordered this encounter  Medications   predniSONE (DELTASONE) 5 MG tablet    Sig: Take 2 tabs by mouth daily x 1 week, then 1.5 tabs daily x 1 week, then 1 tab daily x 1 week, then half tablet daily x 1 week.    Dispense:  35 tablet    Refill:  0     Follow-Up Instructions: Return in about 2 months (around 12/19/2022) for Ps, OA, Psoriatic arthritis.   Pollyann Savoy, MD  Note - This record has been created using Animal nutritionist.  Chart creation errors have been sought, but may not always  have been located. Such creation errors do not reflect on  the standard of medical care.

## 2022-10-18 NOTE — Telephone Encounter (Signed)
Prescription Request  10/18/2022  LOV: 09/28/2022  What is the name of the medication or equipment? Hydrochlorothiazide 25 mg  Have you contacted your pharmacy to request a refill? Yes   Which pharmacy would you like this sent to?  [id number] [place], [place] - [address] [place] [address] Phone: [phone] Fax: [phone]    Patient notified that their request is being sent to the clinical staff for review and that they should receive a response within 2 business days.   Please advise at [facility] [phone]

## 2022-10-19 ENCOUNTER — Telehealth: Payer: Self-pay | Admitting: Pharmacist

## 2022-10-19 ENCOUNTER — Ambulatory Visit: Payer: Medicare PPO | Attending: Physician Assistant | Admitting: Rheumatology

## 2022-10-19 ENCOUNTER — Other Ambulatory Visit: Payer: Self-pay | Admitting: Family Medicine

## 2022-10-19 ENCOUNTER — Encounter: Payer: Self-pay | Admitting: Rheumatology

## 2022-10-19 VITALS — BP 120/77 | HR 76 | Resp 17 | Ht 63.0 in | Wt 116.2 lb

## 2022-10-19 DIAGNOSIS — H15102 Unspecified episcleritis, left eye: Secondary | ICD-10-CM | POA: Diagnosis not present

## 2022-10-19 DIAGNOSIS — Z170 Estrogen receptor positive status [ER+]: Secondary | ICD-10-CM

## 2022-10-19 DIAGNOSIS — M7701 Medial epicondylitis, right elbow: Secondary | ICD-10-CM

## 2022-10-19 DIAGNOSIS — M7062 Trochanteric bursitis, left hip: Secondary | ICD-10-CM

## 2022-10-19 DIAGNOSIS — L4050 Arthropathic psoriasis, unspecified: Secondary | ICD-10-CM | POA: Diagnosis not present

## 2022-10-19 DIAGNOSIS — M79642 Pain in left hand: Secondary | ICD-10-CM

## 2022-10-19 DIAGNOSIS — E222 Syndrome of inappropriate secretion of antidiuretic hormone: Secondary | ICD-10-CM

## 2022-10-19 DIAGNOSIS — M5030 Other cervical disc degeneration, unspecified cervical region: Secondary | ICD-10-CM

## 2022-10-19 DIAGNOSIS — Z79890 Hormone replacement therapy (postmenopausal): Secondary | ICD-10-CM

## 2022-10-19 DIAGNOSIS — L409 Psoriasis, unspecified: Secondary | ICD-10-CM

## 2022-10-19 DIAGNOSIS — M47816 Spondylosis without myelopathy or radiculopathy, lumbar region: Secondary | ICD-10-CM | POA: Diagnosis not present

## 2022-10-19 DIAGNOSIS — D6481 Anemia due to antineoplastic chemotherapy: Secondary | ICD-10-CM

## 2022-10-19 DIAGNOSIS — I6522 Occlusion and stenosis of left carotid artery: Secondary | ICD-10-CM

## 2022-10-19 DIAGNOSIS — Z79899 Other long term (current) drug therapy: Secondary | ICD-10-CM | POA: Diagnosis not present

## 2022-10-19 DIAGNOSIS — M7061 Trochanteric bursitis, right hip: Secondary | ICD-10-CM

## 2022-10-19 DIAGNOSIS — M79641 Pain in right hand: Secondary | ICD-10-CM | POA: Diagnosis not present

## 2022-10-19 DIAGNOSIS — R768 Other specified abnormal immunological findings in serum: Secondary | ICD-10-CM

## 2022-10-19 DIAGNOSIS — T451X5A Adverse effect of antineoplastic and immunosuppressive drugs, initial encounter: Secondary | ICD-10-CM

## 2022-10-19 DIAGNOSIS — G620 Drug-induced polyneuropathy: Secondary | ICD-10-CM

## 2022-10-19 DIAGNOSIS — C50312 Malignant neoplasm of lower-inner quadrant of left female breast: Secondary | ICD-10-CM

## 2022-10-19 DIAGNOSIS — Z1159 Encounter for screening for other viral diseases: Secondary | ICD-10-CM | POA: Diagnosis not present

## 2022-10-19 DIAGNOSIS — F17200 Nicotine dependence, unspecified, uncomplicated: Secondary | ICD-10-CM

## 2022-10-19 DIAGNOSIS — R7689 Other specified abnormal immunological findings in serum: Secondary | ICD-10-CM

## 2022-10-19 MED ORDER — PREDNISONE 5 MG PO TABS
ORAL_TABLET | ORAL | 0 refills | Status: DC
Start: 2022-10-19 — End: 2023-03-25

## 2022-10-19 NOTE — Progress Notes (Signed)
Pharmacy Note  Subjective: Patient presents today to [facility] for follow up office visit. Patient seen by the pharmacist for counseling on methotrexate for psoriatic arthritis and psoriasis . Prior therapy includes: prednisone and Tremfiya (prescribed by dermatologist).  Objective: CBC    Component Value Date/Time   WBC 10.4 09/28/2022 1016   RBC 3.60 (L) 09/28/2022 1016   HGB 12.7 09/28/2022 1016   HGB 12.4 03/12/2019 1353   HGB 12.7 05/20/2016 1052   HCT 38.0 09/28/2022 1016   HCT 36.4 05/20/2016 1052   PLT 339 09/28/2022 1016   PLT 251 03/12/2019 1353   PLT 248 05/20/2016 1052   MCV 105.6 (H) 09/28/2022 1016   MCV 102.8 (H) 05/20/2016 1052   MCH 35.3 (H) 09/28/2022 1016   MCHC 33.4 09/28/2022 1016   RDW 11.7 09/28/2022 1016   RDW 13.2 05/20/2016 1052   LYMPHSABS 1,778 09/28/2022 1016   LYMPHSABS 1.3 05/20/2016 1052   MONOABS 0.9 03/12/2019 1353   MONOABS 0.6 05/20/2016 1052   EOSABS 73 09/28/2022 1016   EOSABS 0.1 05/20/2016 1052   BASOSABS 42 09/28/2022 1016   BASOSABS 0.0 05/20/2016 1052    CMP     Component Value Date/Time   NA 131 (L) 08/04/2021 1112   NA 141 05/20/2016 1052   K 4.3 08/04/2021 1112   K 3.8 05/20/2016 1052   CL 93 (L) 08/04/2021 1112   CO2 28 08/04/2021 1112   CO2 28 05/20/2016 1052   GLUCOSE 78 08/04/2021 1112   GLUCOSE 100 05/20/2016 1052   BUN 12 08/04/2021 1112   BUN 14.4 05/20/2016 1052   CREATININE 0.60 08/04/2021 1112   CREATININE 0.8 05/20/2016 1052   CALCIUM 10.0 08/04/2021 1112   CALCIUM 10.0 05/20/2016 1052   PROT 7.1 08/04/2021 1112   PROT 7.2 05/20/2016 1052   ALBUMIN 4.2 03/12/2019 1353   ALBUMIN 4.3 05/20/2016 1052   AST 33 08/04/2021 1112   AST 24 03/12/2019 1353   AST 26 05/20/2016 1052   ALT 26 08/04/2021 1112   ALT 27 03/12/2019 1353   ALT 29 05/20/2016 1052   ALKPHOS 77 03/12/2019 1353   ALKPHOS 69 05/20/2016 1052   BILITOT 0.6 08/04/2021 1112   BILITOT 0.4 03/12/2019 1353   BILITOT 0.56  05/20/2016 1052   GFRNONAA 86 06/17/2020 1045   GFRAA 100 06/17/2020 1045    Baseline Immunosuppressant Therapy Labs TB GOLD   Hepatitis Panel   HIV No results found for: "HIV" Immunoglobulins   SPEP    Latest Ref Rng & Units 08/04/2021   11:12 AM  Serum Protein Electrophoresis  Total Protein 6.1 - 8.1 g/dL 7.1    Q6VH No results found for: "G6PDH" TPMT No results found for: "TPMT"   Chest-xray Impression (08/07/2019): COPD without acute abnormality.   Contraception: Post-menopausal   Alcohol use: 1-2 glasses of wine per day. She was counseled to avoid alcohol while on methotrexate due to potential for liver toxicity. She was agreeable to this plan.   Assessment/Plan:   Patient was counseled on the purpose, proper use, and adverse effects of methotrexate including nausea, infection, and signs and symptoms of pneumonitis. Discussed that there is the possibility of an increased risk of malignancy, specifically lymphomas, but it is not well understood if this increased risk is due to the medication or the disease state.  Instructed patient that medication should be held for infection and prior to surgery.  Advised patient to avoid live vaccines. Recommend annual influenza, Pneumovax 23, Prevnar 13,  and Shingrix as indicated.   Reviewed instructions with patient to take methotrexate weekly along with folic acid daily.  Discussed the importance of frequent monitoring of kidney and liver function and blood counts, and provided patient with standing lab instructions.  Counseled patient to avoid NSAIDs and alcohol while on methotrexate.  Provided patient with educational materials on methotrexate and answered all questions.   Patient voiced understanding.  Patient consented to methotrexate use.  Will upload into chart.    Initial dose of methotrexate will be 10 mg along with folic acid 1 mg daily. Will increase maintenance dose to 15 mg of methotrexate weekly with 2 mg folic acid daily.  She was also counseled on the prednisone taper dosing schedule. Prescriptions pending.  Sofie Rower, PharmD [facility] PGY-1

## 2022-10-19 NOTE — Patient Instructions (Addendum)
Start prednisone 10mg  daily x 1 week, then 7.5mg  daily x 1 week, then 5mg  daily x 1 week, then 2.5mg  daily x 1 week   Methotrexate Tablets What is this medication? METHOTREXATE (METH oh TREX ate) treats autoimmune conditions, such as arthritis and psoriasis. It works by decreasing inflammation, which can reduce pain and prevent long-term injury to the joints and skin. It may also be used to treat some types of cancer. It works by slowing down the growth of cancer cells. This medicine may be used for other purposes; ask your health care provider or pharmacist if you have questions. COMMON BRAND NAME(S): Rheumatrex, Trexall What should I tell my care team before I take this medication? They need to know if you have any of these conditions: Dehydration Diabetes Fluid in the stomach area or lungs Frequently drink alcohol Having surgery, including dental surgery High cholesterol Immune system problems Inflammatory bowel disease, such as ulcerative colitis Kidney disease Liver disease Low blood cell levels (white cells, red cells, and platelets) Lung disease Recent or ongoing radiation Recent or upcoming vaccine Stomach ulcers, other stomach or intestine problems An unusual or allergic reaction to methotrexate, other medications, foods, dyes, or preservatives Pregnant or trying to get pregnant Breastfeeding How should I use this medication? Take this medication by mouth with water. Take it as directed on the prescription label. Do not take extra. Keep taking this medication until your care team tells you to stop. Know why you are taking this medication and how you should take it. To treat conditions such as arthritis and psoriasis, this medication is taken ONCE A WEEK as a single dose or divided into 3 smaller doses taken 12 hours apart (do not take more than 3 doses 12 hours apart each week). This medication is NEVER taken daily to treat conditions other than cancer. Taking this medication  more often than directed can cause serious side effects, even death. Talk to your care team about why you are taking this medication, how often you will take it, and what your dose is. Ask your care team to put the reason you take this medication on the prescription. If you take this medication ONCE A WEEK, choose a day of the week before you start. Ask your pharmacist to include the day of the week on the label. Avoid "Monday", which could be misread as "Morning". Handling this medication may be harmful. Talk to your care team about how to handle this medication. Special instructions may apply. Talk to your care team about the use of this medication in children. While it may be prescribed for selected conditions, precautions do apply. Overdosage: If you think you have taken too much of this medicine contact a poison control center or emergency room at once. NOTE: This medicine is only for you. Do not share this medicine with others. What if I miss a dose? If you miss a dose, talk with your care team. Do not take double or extra doses. What may interact with this medication? Do not take this medication with any of the following: Acitretin Live virus vaccines Probenecid This medication may also interact with the following: Alcohol Aspirin and aspirin-like medications Certain antibiotics, such as penicillin, neomycin, sulfamethoxazole; trimethoprim Certain medications for stomach problems, such as lansoprazole, omeprazole, pantoprazole Clozapine Cyclosporine Dapsone Folic acid Foscarnet NSAIDs, medications for pain and inflammation, such as ibuprofen or naproxen Phenytoin Pyrimethamine Steroid medications, such as prednisone or cortisone Tacrolimus Theophylline This list may not describe all possible interactions. Give your  health care provider a list of all the medicines, herbs, non-prescription drugs, or dietary supplements you use. Also tell them if you smoke, drink alcohol, or use  illegal drugs. Some items may interact with your medicine. What should I watch for while using this medication? Visit your care team for regular checks on your progress. It may be some time before you see the benefit from this medication. You may need blood work done while you are taking this medication. If your care team has also prescribed folic acid, they may instruct you to skip your folic acid dose on the day you take methotrexate. This medication can make you more sensitive to the sun. Keep out of the sun. If you cannot avoid being in the sun, wear protective clothing and sunscreen. Do not use sun lamps, tanning beds, or tanning booths. Check with your care team if you have severe diarrhea, nausea, and vomiting, or if you sweat a lot. The loss of too much body fluid may make it dangerous for you to take this medication. This medication may increase your risk of getting an infection. Call your care team for advice if you get a fever, chills, sore throat, or other symptoms of a cold or flu. Do not treat yourself. Try to avoid being around people who are sick. Talk to your care team about your risk of cancer. You may be more at risk for certain types of cancers if you take this medication. Talk to your care team if you or your partner may be pregnant. Serious birth defects can occur if you take this medication during pregnancy and for 6 months after the last dose. You will need a negative pregnancy test before starting this medication. Contraception is recommended while taking this medication and for 6 months after the last dose. Your care team can help you find the option that works for you. If your partner can get pregnant, use a condom during sex while taking this medication and for 3 months after the last dose. Do not breastfeed while taking this medication and for 1 week after the last dose. This medication may cause infertility. Talk to your care team if you are concerned about your  fertility. What side effects may I notice from receiving this medication? Side effects that you should report to your care team as soon as possible: Allergic reactions--skin rash, itching, hives, swelling of the face, lips, tongue, or throat Dry cough, shortness of breath or trouble breathing Infection--fever, chills, cough, sore throat, wounds that don't heal, pain or trouble when passing urine, general feeling of discomfort or being unwell Kidney injury--decrease in the amount of urine, swelling of the ankles, hands, or feet Liver injury--right upper belly pain, loss of appetite, nausea, light-colored stool, dark yellow or brown urine, yellowing skin or eyes, unusual weakness or fatigue Low red blood cell level--unusual weakness or fatigue, dizziness, headache, trouble breathing Pain, tingling, or numbness in the hands or feet, muscle weakness, change in vision, confusion or trouble speaking, loss of balance or coordination, trouble walking, seizures Redness, blistering, peeling, or loosening of the skin, including inside the mouth Stomach bleeding--bloody or black, tar-like stools, vomiting blood or brown material that looks like coffee grounds Stomach pain that is severe, does not away, or gets worse Unusual bruising or bleeding Side effects that usually do not require medical attention (report these to your care team if they continue or are bothersome): Diarrhea Dizziness Hair loss Nausea Pain, redness, or swelling with sores inside the mouth  or throat Skin reactions on sun-exposed areas Vomiting This list may not describe all possible side effects. Call your doctor for medical advice about side effects. You may report side effects to FDA at 1-800-FDA-1088. Where should I keep my medication? Keep out of the reach of children and pets. Store at room temperature between 20 and 25 degrees C (68 and 77 degrees F). Protect from light. Keep the container tightly closed. Get rid of any unused  medication after the expiration date. To get rid of medications that are no longer needed or have expired: Take the medication to a medication take-back program. Check with your pharmacy or law enforcement to find a location. If you cannot return the medication, ask your pharmacist or care team how to get rid of this medication safely. NOTE: This sheet is a summary. It may not cover all possible information. If you have questions about this medicine, talk to your doctor, pharmacist, or health care provider.  2024 [employer]vier/Gold Standard (2022-01-12 00:00:00)  Standing Labs We placed an order today for your standing lab work.   Please have your standing labs drawn in 2 weeks x 2 and then every 3 months  Please have your labs drawn 2 weeks prior to your appointment so that the provider can discuss your lab results at your appointment, if possible.  Please note that you may see your imaging and lab results in MyChart before we have reviewed them. We will contact you once all results are reviewed. Please allow our office up to 72 hours to thoroughly review all of the results before contacting the office for clarification of your results.  WALK-IN LAB HOURS  Monday through Thursday from 8:00 am -12:30 pm and 1:00 pm-5:00 pm and Friday from 8:00 am-12:00 pm.  Patients with office visits requiring labs will be seen before walk-in labs.  You may encounter longer than normal wait times. Please allow additional time. Wait times may be shorter on  Monday and Thursday afternoons.  We do not book appointments for walk-in labs. We appreciate your patience and understanding with our staff.   Labs are drawn by [facility]. Please bring your co-pay at the time of your lab draw.  You may receive a bill from [facility] for your lab work.  Please note if you are on Hydroxychloroquine and and an order has been placed for a Hydroxychloroquine level,  you will need to have it drawn 4 hours or more after your last  dose.  If you wish to have your labs drawn at another location, please call the office 24 hours in advance so we can fax the orders.  The office is located at [address], [address], [place], [place] [address]   If you have any questions regarding directions or hours of operation,  please call [phone].   As a reminder, please drink plenty of water prior to coming for your lab work. Thanks!   Vaccines You are taking a medication(s) that can suppress your immune system.  The following immunizations are recommended: Flu annually Covid-19  RSV Td/Tdap (tetanus, diphtheria, pertussis) every 10 years Pneumonia (Prevnar 15 then Pneumovax 23 at least 1 year apart.  Alternatively, can take Prevnar 20 without needing additional dose) Shingrix: 2 doses from 4 weeks to 6 months apart  Please check with your PCP to make sure you are up to date.   If you have signs or symptoms of an infection or start antibiotics: First, call your PCP for workup of your infection. Hold  your medication through the infection, until you complete your antibiotics, and until symptoms resolve if you take the following: Injectable medication (Actemra, Benlysta, Cimzia, Cosentyx, Enbrel, Humira, Kevzara, Orencia, Remicade, Simponi, Stelara, Taltz, Tremfya) Methotrexate Leflunomide (Arava) Mycophenolate (Cellcept) Kim Ortega, Olumiant, or Rinvoq

## 2022-10-19 NOTE — Telephone Encounter (Signed)
Pending baseline labs from today, patient will be starting pral MTX  Dose: 10mg  p.o. weekly x 2 weeks. Repeat labs. If stable, increase to 6 tabs p.o. once weekly Folic acid 1mg  daily x 2 weeks then 2mg  daily thereafter  Chesley Mires, PharmD, MPH, BCPS, CPP Clinical Pharmacist (Rheumatology and Pulmonology)

## 2022-10-20 ENCOUNTER — Other Ambulatory Visit: Payer: Self-pay | Admitting: Family Medicine

## 2022-10-21 ENCOUNTER — Other Ambulatory Visit: Payer: Self-pay | Admitting: Family Medicine

## 2022-10-21 ENCOUNTER — Telehealth: Payer: Self-pay | Admitting: *Deleted

## 2022-10-21 ENCOUNTER — Encounter: Payer: Self-pay | Admitting: Family Medicine

## 2022-10-21 MED ORDER — METHOTREXATE SODIUM 2.5 MG PO TABS: ORAL_TABLET | ORAL | 0 refills | Status: DC

## 2022-10-21 MED ORDER — HYDROCODONE-ACETAMINOPHEN 5-325 MG PO TABS: 1.0000 | ORAL_TABLET | Freq: Three times a day (TID) | ORAL | 0 refills | Status: DC | PRN

## 2022-10-21 MED ORDER — ATENOLOL 50 MG PO TABS: 50.0000 mg | ORAL_TABLET | Freq: Every day | ORAL | 0 refills | Status: DC

## 2022-10-21 MED ORDER — FOLIC ACID 1 MG PO TABS: 2.0000 mg | ORAL_TABLET | Freq: Every day | ORAL | 3 refills | Status: DC

## 2022-10-21 NOTE — Telephone Encounter (Signed)
-----   Message from [facility] sent at 10/21/2022 12:52 PM EDT ----- CMP normal, hepatitis B nonreactive, hepatitis C nonreactive, immunoglobulins normal, TB Gold negative, SPEP pending.  Okay to send the prescription for methotrexate as discussed during the last visit.

## 2022-10-21 NOTE — Progress Notes (Signed)
CMP normal, hepatitis B nonreactive, hepatitis C nonreactive, immunoglobulins normal, TB Gold negative, SPEP pending.  Okay to send the prescription for methotrexate as discussed during the last visit.

## 2022-10-23 LAB — COMPLETE METABOLIC PANEL WITH GFR
AG Ratio: 1.4 (calc) (ref 1.0–2.5)
ALT: 17 U/L (ref 6–29)
AST: 26 U/L (ref 10–35)
Albumin: 3.9 g/dL (ref 3.6–5.1)
Alkaline phosphatase (APISO): 97 U/L (ref 37–153)
BUN: 18 mg/dL (ref 7–25)
CO2: 22 mmol/L (ref 20–32)
Calcium: 9.7 mg/dL (ref 8.6–10.4)
Chloride: 98 mmol/L (ref 98–110)
Creat: 0.71 mg/dL (ref 0.50–1.05)
Globulin: 2.8 g/dL (ref 1.9–3.7)
Glucose, Bld: 86 mg/dL (ref 65–99)
Potassium: 4.7 mmol/L (ref 3.5–5.3)
Sodium: 134 mmol/L — ABNORMAL LOW (ref 135–146)
Total Bilirubin: 0.4 mg/dL (ref 0.2–1.2)
Total Protein: 6.7 g/dL (ref 6.1–8.1)
eGFR: 93 mL/min/{1.73_m2} (ref >=60)

## 2022-10-23 LAB — PROTEIN ELECTROPHORESIS, SERUM, WITH REFLEX
Albumin ELP: 4 g/dL (ref 3.8–4.8)
Alpha 1: 0.5 g/dL — ABNORMAL HIGH (ref 0.2–0.3)
Alpha 2: 0.9 g/dL (ref 0.5–0.9)
Beta 2: 0.4 g/dL (ref 0.2–0.5)
Beta Globulin: 0.5 g/dL (ref 0.4–0.6)
Gamma Globulin: 0.9 g/dL (ref 0.8–1.7)
Total Protein: 7.1 g/dL (ref 6.1–8.1)

## 2022-10-23 LAB — QUANTIFERON-TB GOLD PLUS
Mitogen-NIL: 2.39 [IU]/mL
NIL: 0.01 [IU]/mL
QuantiFERON-TB Gold Plus: NEGATIVE
TB1-NIL: 0.01 [IU]/mL
TB2-NIL: 0.01 [IU]/mL

## 2022-10-23 LAB — HEPATITIS B CORE ANTIBODY, IGM: Hep B C IgM: NONREACTIVE

## 2022-10-23 LAB — IGG, IGA, IGM
IgG (Immunoglobin G), Serum: 954 mg/dL (ref 600–1540)
IgM, Serum: 135 mg/dL (ref 50–300)
Immunoglobulin A: 243 mg/dL (ref 70–320)

## 2022-10-23 LAB — HEPATITIS C ANTIBODY: Hepatitis C Ab: NONREACTIVE

## 2022-10-23 LAB — IFE INTERPRETATION

## 2022-10-23 LAB — HEPATITIS B SURFACE ANTIGEN: Hepatitis B Surface Ag: NONREACTIVE

## 2022-10-25 NOTE — Progress Notes (Signed)
IFE is normal.

## 2022-10-27 ENCOUNTER — Encounter: Payer: Self-pay | Admitting: Gastroenterology

## 2022-11-03 ENCOUNTER — Ambulatory Visit: Payer: Medicare PPO | Admitting: Physician Assistant

## 2022-11-04 ENCOUNTER — Other Ambulatory Visit: Payer: Self-pay | Admitting: *Deleted

## 2022-11-04 DIAGNOSIS — Z79899 Other long term (current) drug therapy: Secondary | ICD-10-CM | POA: Diagnosis not present

## 2022-11-05 ENCOUNTER — Other Ambulatory Visit: Payer: Self-pay | Admitting: Rheumatology

## 2022-11-05 DIAGNOSIS — L409 Psoriasis, unspecified: Secondary | ICD-10-CM

## 2022-11-05 DIAGNOSIS — Z79899 Other long term (current) drug therapy: Secondary | ICD-10-CM

## 2022-11-05 DIAGNOSIS — L4050 Arthropathic psoriasis, unspecified: Secondary | ICD-10-CM

## 2022-11-05 LAB — COMPLETE METABOLIC PANEL WITH GFR
AG Ratio: 1.5 (calc) (ref 1.0–2.5)
ALT: 15 U/L (ref 6–29)
AST: 18 U/L (ref 10–35)
Albumin: 3.8 g/dL (ref 3.6–5.1)
Alkaline phosphatase (APISO): 87 U/L (ref 37–153)
BUN: 17 mg/dL (ref 7–25)
CO2: 27 mmol/L (ref 20–32)
Calcium: 9.5 mg/dL (ref 8.6–10.4)
Chloride: 96 mmol/L — ABNORMAL LOW (ref 98–110)
Creat: 0.71 mg/dL (ref 0.50–1.05)
Globulin: 2.6 g/dL (ref 1.9–3.7)
Glucose, Bld: 99 mg/dL (ref 65–99)
Potassium: 4.3 mmol/L (ref 3.5–5.3)
Sodium: 132 mmol/L — ABNORMAL LOW (ref 135–146)
Total Bilirubin: 0.5 mg/dL (ref 0.2–1.2)
Total Protein: 6.4 g/dL (ref 6.1–8.1)
eGFR: 93 mL/min/{1.73_m2} (ref >=60)

## 2022-11-05 LAB — CBC WITH DIFFERENTIAL/PLATELET
Absolute Lymphocytes: 1975 {cells}/uL (ref 850–3900)
Absolute Monocytes: 1013 {cells}/uL — ABNORMAL HIGH (ref 200–950)
Basophils Absolute: 50 {cells}/uL (ref 0–200)
Basophils Relative: 0.4 %
Eosinophils Absolute: 50 {cells}/uL (ref 15–500)
Eosinophils Relative: 0.4 %
HCT: 33.7 % — ABNORMAL LOW (ref 35.0–45.0)
Hemoglobin: 11.1 g/dL — ABNORMAL LOW (ref 11.7–15.5)
MCH: 34.7 pg — ABNORMAL HIGH (ref 27.0–33.0)
MCHC: 32.9 g/dL (ref 32.0–36.0)
MCV: 105.3 fL — ABNORMAL HIGH (ref 80.0–100.0)
MPV: 9 fL (ref 7.5–12.5)
Monocytes Relative: 8.1 %
Neutro Abs: 9413 {cells}/uL — ABNORMAL HIGH (ref 1500–7800)
Neutrophils Relative %: 75.3 %
Platelets: 345 10*3/uL (ref 140–400)
RBC: 3.2 10*6/uL — ABNORMAL LOW (ref 3.80–5.10)
RDW: 12.2 % (ref 11.0–15.0)
Total Lymphocyte: 15.8 %
WBC: 12.5 10*3/uL — ABNORMAL HIGH (ref 3.8–10.8)

## 2022-11-05 NOTE — Progress Notes (Signed)
White cell count is elevated due to prednisone use.  Hemoglobin is low.  MCV is high.  Patient should take folic acid on a daily basis.  CMP is stable.

## 2022-11-10 ENCOUNTER — Ambulatory Visit (AMBULATORY_SURGERY_CENTER): Payer: Medicare PPO

## 2022-11-10 VITALS — Ht 63.0 in | Wt 115.0 lb

## 2022-11-10 DIAGNOSIS — Z1211 Encounter for screening for malignant neoplasm of colon: Secondary | ICD-10-CM

## 2022-11-10 MED ORDER — NA SULFATE-K SULFATE-MG SULF 17.5-3.13-1.6 GM/177ML PO SOLN: 1.0000 | Freq: Once | ORAL | 0 refills | Status: AC

## 2022-11-10 NOTE — Progress Notes (Signed)

## 2022-11-15 NOTE — Progress Notes (Unsigned)
Office Visit Note  Patient: Kim Ortega             Date of Birth: 02-Jan-1956           MRN: [id number]             PCP: [name], MD Referring: [name], MD Visit Date: 11/16/2022 Occupation: @GUAROCC @  Subjective:  Pain and swelling in right hand  History of Present Illness: Kim Ortega is a 67 y.o. female with psoriatic arthritis and psoriasis.  She states for the last couple of months she has been having increased pain and swelling in her right hand.  She states the pain has been worse recently.  She has been having nocturnal pain.  She has been experiencing numbness in her right hand.  She denies any interruption in Tremfya or methotrexate.  She took Tremfya last Friday.  She increased methotrexate to 6 tablets p.o. weekly for the last 2 weeks.  He finished prednisone yesterday.  She had no recurrence of episcleritis.  None of the other joints are painful.    Activities of Daily Living:  Patient reports morning stiffness for 6 hours.   Patient Reports nocturnal pain.  Difficulty dressing/grooming: Reports Difficulty climbing stairs: Denies Difficulty getting out of chair: Denies Difficulty using hands for taps, buttons, cutlery, and/or writing: Reports  Review of Systems  Constitutional:  Negative for fatigue.  HENT:  Negative for mouth sores and mouth dryness.   Eyes:  Negative for dryness.  Respiratory:  Negative for shortness of breath.   Cardiovascular:  Negative for chest pain and palpitations.  Gastrointestinal:  Negative for blood in stool, constipation and diarrhea.  Endocrine: Negative for increased urination.  Genitourinary:  Negative for involuntary urination.  Musculoskeletal:  Positive for joint pain, joint pain, joint swelling and morning stiffness. Negative for gait problem, myalgias, muscle weakness, muscle tenderness and myalgias.  Skin:  Negative for color change, rash, hair loss and sensitivity to sunlight.   Allergic/Immunologic: Negative for susceptible to infections.  Neurological:  Negative for dizziness and headaches.  Hematological:  Negative for swollen glands.  Psychiatric/Behavioral:  Positive for sleep disturbance. Negative for depressed mood. The patient is not nervous/anxious.     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   Antineoplastic chemotherapy induced anemia 08/27/2015   Breast cancer of lower-inner quadrant of left female breast (HCC) 07/03/2015   Left carotid artery stenosis    Psoriasis    Hormone replacement therapy    Smoker    Episcleritis     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 ,grade 1   COPD (chronic obstructive pulmonary disease) (HCC)    Episcleritis    Hormone replacement therapy    Hyperlipidemia    Hypertension    Left carotid artery stenosis    40-59% (01/2015)   Personal history of chemotherapy 2017   neoadjuvant   Personal history of  radiation therapy 2018   Psoriasis    SIADH (syndrome of inappropriate ADH production) (HCC)    Smoker     Family History  Problem Relation Age of Onset   Stroke Mother    Colon polyps Sister    Colon cancer Brother    Esophageal cancer Neg Hx    Rectal cancer Neg Hx    Stomach cancer Neg Hx    Past Surgical History:  Procedure Laterality Date   ABDOMINAL HYSTERECTOMY     BREAST BIOPSY Left 06/2015   BREAST LUMPECTOMY Left 01/2016   BREAST LUMPECTOMY  WITH RADIOACTIVE SEED AND SENTINEL LYMPH NODE BIOPSY Left 01/08/2016   Procedure: LEFT BREAST LUMPECTOMY WITH RADIOACTIVE SEED AND SENTINEL LYMPH NODE BIOPSY, BLUE DYE INJECTION;  Surgeon: Emelia Loron, MD;  Location: [facility];  Service: General;  Laterality: Left;   cervical acdf     C5-6- fusion with plates/screws "Dr. Newell Coral" '12   COLONOSCOPY WITH PROPOFOL N/A 04/16/2014   Procedure: COLONOSCOPY WITH PROPOFOL;  Surgeon: Charolett Bumpers, MD;  Location: [facility];  Service: Endoscopy;  Laterality: N/A;   COLPOSCOPY     laporotomy     Ovarian cyst   PORT-A-CATH REMOVAL Right 01/08/2016   Procedure: REMOVAL PORT-A-CATH;  Surgeon: Emelia Loron, MD;  Location: [facility];  Service: General;  Laterality: Right;   PORTACATH PLACEMENT N/A 07/24/2015   Procedure: INSERTION PORT-A-CATH WITH [place];  Surgeon: Emelia Loron, MD;  Location: [facility] OR;  Service: General;  Laterality: N/A;   ROBOTIC ASSISTED SALPINGO OOPHERECTOMY Bilateral 10/10/2018   Procedure: XI ROBOTIC ASSISTED SALPINGO OOPHORECTOMY;  Surgeon: Adolphus Birchwood, MD;  Location: [facility];  Service: Gynecology;  Laterality: Bilateral;   TONSILLECTOMY     child   Social History   Social History Narrative   Married x 25 years.   Family lives nearby.   Immunization History  Administered Date(s) Administered   Influenza Split 11/04/2012   Influenza, Seasonal, Injecte, Preservative Fre 09/16/2022   Influenza,inj,Quad PF,6+ Mos 10/25/2016, 09/20/2018   Influenza-Unspecified 11/04/2013   MODERNA COVID-19 SARS-COV-2 PEDS BIVALENT BOOSTER 9yr-92yr 03/27/2019, 04/24/2019   Pneumococcal Polysaccharide-23 10/25/2016   Tdap 07/04/2009     Objective: Vital Signs: BP 96/62 (BP Location: Left Arm, Patient Position: Sitting, Cuff Size: Normal)   Pulse 76   Resp 14   Ht 5\' 3"  (1.6 m)   Wt 116 lb (52.6 kg)   BMI 20.55 kg/m    Physical Exam Vitals and nursing note reviewed.  Constitutional:       Appearance: She is well-developed.  HENT:     Head: Normocephalic and atraumatic.  Eyes:     Conjunctiva/sclera: Conjunctivae normal.  Cardiovascular:     Rate and Rhythm: Normal rate and regular rhythm.     Heart sounds: Normal heart sounds.  Pulmonary:     Effort: Pulmonary effort is normal.     Breath sounds: Normal breath sounds.  Abdominal:     General: Bowel sounds are normal.     Palpations: Abdomen is soft.  Musculoskeletal:     Cervical back: Normal range of motion.  Lymphadenopathy:     Cervical: No cervical adenopathy.  Skin:    General: Skin is warm and dry.     Capillary Refill: Capillary refill takes less than 2 seconds.  Neurological:     Mental Status: She is alert and oriented to person, place, and time.  Psychiatric:        Behavior: Behavior normal.  Musculoskeletal Exam: She had limited lateral rotation of the cervical spine.  She had no tenderness over thoracic or lumbar spine.  No SI joint tenderness was noted.  Shoulder joints and elbow joints in good range of motion.  She had limited flexion and extension of her right wrist joint with tenderness on palpation.  Left wrist joint was in full range of motion.  There was no tenderness over MCPs PIPs or DIPs.  Hip joints and knee joints were in good range of motion.  There was no tenderness over ankles or MTPs.  There was no Achilles tendinitis or plantar fasciitis.  CDAI Exam: CDAI Score: -- Patient Global: --; Provider Global: -- Swollen: --; Tender: -- Joint Exam 11/16/2022   No joint exam has been documented for this visit   There is currently no information documented on the homunculus. Go to the Rheumatology activity and complete the homunculus joint exam.  Investigation: No additional findings.  Imaging: No results found.  Recent Labs: Lab Results  Component Value Date   WBC 12.5 (H) 11/04/2022   HGB 11.1 (L) 11/04/2022   PLT 345 11/04/2022   NA 132 (L) 11/04/2022   K 4.3 11/04/2022    CL 96 (L) 11/04/2022   CO2 27 11/04/2022   GLUCOSE 99 11/04/2022   BUN 17 11/04/2022   CREATININE 0.71 11/04/2022   BILITOT 0.5 11/04/2022   ALKPHOS 77 03/12/2019   AST 18 11/04/2022   ALT 15 11/04/2022   PROT 6.4 11/04/2022   ALBUMIN 4.2 03/12/2019   CALCIUM 9.5 11/04/2022   GFRAA 100 06/17/2020   QFTBGOLDPLUS NEGATIVE 10/19/2022    Speciality Comments: No specialty comments available.  Procedures:  Hand/UE Inj for (intercarpal) on 11/16/2022 11:51 AM Indications: therapeutic Details: 27 G needle, dorsal approach Medications: 1 mL lidocaine 1 %; 30 mg triamcinolone acetonide 40 MG/ML Aspirate: 0 mL Procedure, treatment alternatives, risks and benefits explained, specific risks discussed. Consent was given by the patient. Immediately prior to procedure a time out was called to verify the correct patient, procedure, equipment, support staff and site/side marked as required. Patient was prepped and draped in the usual sterile fashion.     Allergies: Patient has no known allergies.   Assessment / Plan:     Visit Diagnoses: Psoriatic arthropathy (HCC)-patient states that she finished prednisone taper yesterday.  She has been on methotrexate for 3 weeks now.  She has not noticed much improvement.  She continues to have pain and swelling in her right wrist.  She states she is also having tingling in her right hand.  She requested a right wrist joint injection.  None of the other joints are painful.  She states methotrexate is causing GI side effects with the abdominal discomfort.  Synovitis was noted in the right wrist joint.  I discussed the option of switching her from [place] to [place].  Patient states she took Tremfya injection last week.  She also took methotrexate yesterday.  Will wait about a month before starting Taltz.  Indications side effects and contraindications of Taltz were discussed at length.  Patient was in agreement to move to [place].  She will discontinue Tremfya.  I would  not increase the dose of methotrexate further as she is having GI side effects.  Patient requested a right wrist joint injection.  Psoriasis - Tremfya since January 2023 by Dr. Emily Filbert with 99% skin clearance.  She uses clobetasol foam on the scalp.  High risk medication use - methotrexate 6 tablets p.o. weekly, folic  acid 1mg  po qd, she finished prednisone taper.. - Plan: CBC with differential/Platelet, COMPLETE METABOLIC PANEL WITH GFR today.  She will get labs in a month after starting Toltz and then every 3 months.  Rheumatoid factor positive - Rheumatoid factor 24, anti-CCP negative, normal, CRP negative.  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  Swelling of right wrist-she has been having pain and swelling in the right wrist joint and nocturnal pain.  Per patient's request right wrist joint was injected with lidocaine and Kenalog as described above.  Patient tolerated the procedure well.  Postprocedure instructions were given.  Pain in both hands -she has osteoarthritic changes in her hands.  X-rays were consistent with osteoarthritis.  X-ray findings were discussed.  Trochanteric bursitis of both hips-currently is symptomatic.  DDD (degenerative disc disease), cervical - Status post fusion by Dr. Newell Coral.  She has limited range of motion.  Spondylosis of lumbar spine -chronic pain.  MRI January 12, 2021 showed mild degeneration with foraminal stenosis.  Epidural injection by Dr. Alvester Morin in April 2023 relieved pain.  Malignant neoplasm of lower-inner quadrant of left breast in female, estrogen receptor positive [facility]) - dxd June 2017.  Chemotherapy-induced peripheral neuropathy (HCC)  Antineoplastic chemotherapy induced anemia  Hormone replacement therapy  SIADH (syndrome of inappropriate ADH production) (HCC)  Left carotid artery stenosis  Smoker  Orders: Orders Placed This Encounter  Procedures   Hand/UE Inj   CBC with Differential/Platelet    COMPLETE METABOLIC PANEL WITH GFR   No orders of the defined types were placed in this encounter.   Follow-Up Instructions: Return in about 2 months (around 01/16/2023) for Psoriatic arthritis.   Pollyann Savoy, MD  Note - This record has been created using Animal nutritionist.  Chart creation errors have been sought, but may not always  have been located. Such creation errors do not reflect on  the standard of medical care.

## 2022-11-16 ENCOUNTER — Ambulatory Visit: Payer: Medicare PPO | Attending: Rheumatology | Admitting: Rheumatology

## 2022-11-16 ENCOUNTER — Encounter: Payer: Self-pay | Admitting: Rheumatology

## 2022-11-16 ENCOUNTER — Telehealth: Payer: Self-pay | Admitting: Pharmacist

## 2022-11-16 VITALS — BP 96/62 | HR 76 | Resp 14 | Ht 63.0 in | Wt 116.0 lb

## 2022-11-16 DIAGNOSIS — M25431 Effusion, right wrist: Secondary | ICD-10-CM | POA: Diagnosis not present

## 2022-11-16 DIAGNOSIS — L409 Psoriasis, unspecified: Secondary | ICD-10-CM

## 2022-11-16 DIAGNOSIS — M79641 Pain in right hand: Secondary | ICD-10-CM | POA: Diagnosis not present

## 2022-11-16 DIAGNOSIS — M7061 Trochanteric bursitis, right hip: Secondary | ICD-10-CM

## 2022-11-16 DIAGNOSIS — T451X5A Adverse effect of antineoplastic and immunosuppressive drugs, initial encounter: Secondary | ICD-10-CM

## 2022-11-16 DIAGNOSIS — M47816 Spondylosis without myelopathy or radiculopathy, lumbar region: Secondary | ICD-10-CM

## 2022-11-16 DIAGNOSIS — Z79899 Other long term (current) drug therapy: Secondary | ICD-10-CM

## 2022-11-16 DIAGNOSIS — D6481 Anemia due to antineoplastic chemotherapy: Secondary | ICD-10-CM

## 2022-11-16 DIAGNOSIS — C50312 Malignant neoplasm of lower-inner quadrant of left female breast: Secondary | ICD-10-CM

## 2022-11-16 DIAGNOSIS — H15102 Unspecified episcleritis, left eye: Secondary | ICD-10-CM

## 2022-11-16 DIAGNOSIS — G620 Drug-induced polyneuropathy: Secondary | ICD-10-CM

## 2022-11-16 DIAGNOSIS — I6522 Occlusion and stenosis of left carotid artery: Secondary | ICD-10-CM

## 2022-11-16 DIAGNOSIS — L4050 Arthropathic psoriasis, unspecified: Secondary | ICD-10-CM

## 2022-11-16 DIAGNOSIS — F17200 Nicotine dependence, unspecified, uncomplicated: Secondary | ICD-10-CM

## 2022-11-16 DIAGNOSIS — E222 Syndrome of inappropriate secretion of antidiuretic hormone: Secondary | ICD-10-CM

## 2022-11-16 DIAGNOSIS — Z79890 Hormone replacement therapy (postmenopausal): Secondary | ICD-10-CM

## 2022-11-16 DIAGNOSIS — M7062 Trochanteric bursitis, left hip: Secondary | ICD-10-CM

## 2022-11-16 DIAGNOSIS — Z170 Estrogen receptor positive status [ER+]: Secondary | ICD-10-CM

## 2022-11-16 DIAGNOSIS — M5030 Other cervical disc degeneration, unspecified cervical region: Secondary | ICD-10-CM

## 2022-11-16 DIAGNOSIS — R768 Other specified abnormal immunological findings in serum: Secondary | ICD-10-CM | POA: Diagnosis not present

## 2022-11-16 DIAGNOSIS — M79642 Pain in left hand: Secondary | ICD-10-CM

## 2022-11-16 MED ORDER — TRIAMCINOLONE ACETONIDE 40 MG/ML IJ SUSP
30.0000 mg | INTRAMUSCULAR | Status: AC | PRN
  Administered 2022-11-16: 30 mg

## 2022-11-16 MED ORDER — LIDOCAINE HCL 1 % IJ SOLN
1.0000 mL | INTRAMUSCULAR | Status: AC | PRN
  Administered 2022-11-16: 1 mL

## 2022-11-16 NOTE — Telephone Encounter (Signed)
Pending OV note from today, patient will be new start to Rance Muir dose will be: For psoriatic arthritis and plaque psoriasis overlap load of 160 mg then 80 mg on weeks 2,4,6,8,10,12 then 80 mg every 28 days    Changing from Tremfya (last dose 11/15/22) - she can start Taltz on or after 12/13/2022, MTX (current but experiencing GI side effects).  Patient completed her portion of Taltz PAP application at OV. Provider portion placed in Dr. Fatima Sanger folder for signature  Chesley Mires, PharmD, MPH, BCPS, CPP Clinical Pharmacist (Rheumatology and Pulmonology)

## 2022-11-16 NOTE — Patient Instructions (Signed)
Discontinue Tremfya  Ixekizumab Injection What is this medication? IXEKIZUMAB (ix e KIZ ue mab) treats autoimmune conditions, such as psoriasis and arthritis. It works by slowing down an overactive immune system. It is a monoclonal antibody. This medicine may be used for other purposes; ask your health care provider or pharmacist if you have questions. COMMON BRAND NAME(S): TALTZ What should I tell my care team before I take this medication? They need to know if you have any of these conditions: Immune system problems Infection, such as viral infection, chickenpox, cold sores, or herpes Recently received or are scheduled to receive a vaccine Tuberculosis, a positive skin test for tuberculosis, or recent close contact with someone who has tuberculosis An unusual or allergic reaction to ixekizumab, other medications, foods, dyes, or preservatives Pregnant or trying to get pregnant Breast-feeding How should I use this medication? This medication is injected under the skin. It is usually given by your care team in a hospital or clinic setting. It may also be given at home. If you get this medication at home, you will be taught how to prepare and give it. Use exactly as directed. Take it as directed on the prescription label at the same time every day. Keep taking it unless your care team tells you to stop. It is important that you put your used needles and syringes in a special sharps container. Do not put them in a trash can. If you do not have a sharps container, call your pharmacist or care team to get one. A special MedGuide will be given to you by the pharmacist with each prescription and refill. If you are getting this medication in a hospital or clinic, a special MedGuide will be given to you before each treatment. Be sure to read this information carefully each time. Talk to your care team about the use of this medication in children. While it be prescribed for children as young as 6 years  for selected conditions, precautions do apply. Overdosage: If you think you have taken too much of this medicine contact a poison control center or emergency room at once. NOTE: This medicine is only for you. Do not share this medicine with others. What if I miss a dose? If you get this medication at the hospital or clinic: It is important not to miss your dose. Call your care team if you are unable to keep an appointment. If you give yourself the medication at home: If you miss a dose, take it as soon as you can. Then continue your normal schedule. Do not take double or extra doses. Call your care team with questions. What may interact with this medication? Do not take this medication with any of the following: Live virus vaccines This medication may also interact with the following: Inactivated vaccines This list may not describe all possible interactions. Give your health care provider a list of all the medicines, herbs, non-prescription drugs, or dietary supplements you use. Also tell them if you smoke, drink alcohol, or use illegal drugs. Some items may interact with your medicine. What should I watch for while using this medication? Visit your care team for regular checks on your progress. Tell your care team if your symptoms do not start to get better or if they get worse. You will be tested for tuberculosis (TB) before you start this medication. If your care team prescribes any medication for TB, you should start taking the TB medication before starting this medication. Make sure to finish the full  course of TB medication. This medication may increase your risk of getting an infection. Call your care team for advice if you get a fever, chills, sore throat, or other symptoms of a cold or flu. Do not treat yourself. Try to avoid being around people who are sick. This medication can decrease the response to a vaccine. If you need to get vaccinated, tell your care team if you have received this  medication within the last 6 months. Extra booster doses may be needed. Talk to your care team to see if a different vaccination schedule is needed. What side effects may I notice from receiving this medication? Side effects that you should report to your care team as soon as possible: Allergic reactions--skin rash, itching, hives, swelling of the face, lips, tongue, or throat Infection--fever, chills, cough, sore throat, wounds that don't heal, pain or trouble when passing urine, general feeling of discomfort or being unwell Sudden or severe stomach pain, bloody diarrhea, fever, nausea, vomiting Side effects that usually do not require medical attention (report to your care team if they continue or are bothersome): Nausea Pain, redness, or irritation at injection site Runny or stuffy nose Sore throat This list may not describe all possible side effects. Call your doctor for medical advice about side effects. You may report side effects to FDA at 1-800-FDA-1088. Where should I keep my medication? Keep out of the reach of children and pets. Store in the refrigerator. Do not freeze. Do not shake. Keep this medication in the original container. Protect from light. Use the dose within 30 minutes of removing it from the refrigerator. Get rid of any unused medication after the expiration date. To get rid of medications that are no longer needed or have expired: Take the medication to a medication take-back program. Check with your pharmacy or law enforcement to find a location. If you cannot return the medication, ask your pharmacist or care team how to get rid of this medication safely. NOTE: This sheet is a summary. It may not cover all possible information. If you have questions about this medicine, talk to your doctor, pharmacist, or health care provider.  2024 Elsevier/Gold Standard (2021-03-11 00:00:00) Standing Labs We placed an order today for your standing lab work.   Please have your  standing labs drawn in 1 month after Taltz and then every 3 months.  Please have your labs drawn 2 weeks prior to your appointment so that the provider can discuss your lab results at your appointment, if possible.  Please note that you may see your imaging and lab results in MyChart before we have reviewed them. We will contact you once all results are reviewed. Please allow our office up to 72 hours to thoroughly review all of the results before contacting the office for clarification of your results.  WALK-IN LAB HOURS  Monday through Thursday from 8:00 am -12:30 pm and 1:00 pm-5:00 pm and Friday from 8:00 am-12:00 pm.  Patients with office visits requiring labs will be seen before walk-in labs.  You may encounter longer than normal wait times. Please allow additional time. Wait times may be shorter on  Monday and Thursday afternoons.  We do not book appointments for walk-in labs. We appreciate your patience and understanding with our staff.   Labs are drawn by [facility]. Please bring your co-pay at the time of your lab draw.  You may receive a bill from [facility] for your lab work.  Please note if you are on Hydroxychloroquine and  and an order has been placed for a Hydroxychloroquine level,  you will need to have it drawn 4 hours or more after your last dose.  If you wish to have your labs drawn at another location, please call the office 24 hours in advance so we can fax the orders.  The office is located at [address], [place], [place] [address]   If you have any questions regarding directions or hours of operation,  please call [phone].   As a reminder, please drink plenty of water prior to coming for your lab work. Thanks!   Vaccines You are taking a medication(s) that can suppress your immune system.  The following immunizations are recommended: Flu annually Covid-19  RSV Td/Tdap (tetanus, diphtheria, pertussis) every 10 years Pneumonia (Prevnar 15 then  Pneumovax 23 at least 1 year apart.  Alternatively, can take Prevnar 20 without needing additional dose) Shingrix: 2 doses from 4 weeks to 6 months apart  Please check with your PCP to make sure you are up to date.  If you have signs or symptoms of an infection or start antibiotics: First, call your PCP for workup of your infection. Hold your medication through the infection, until you complete your antibiotics, and until symptoms resolve if you take the following: Injectable medication (Actemra, Benlysta, Cimzia, Cosentyx, Enbrel, Humira, Kevzara, Orencia, Remicade, Simponi, Stelara, Taltz, Tremfya) Methotrexate Leflunomide (Arava) Mycophenolate (Cellcept) Harriette Ohara, Olumiant, or Rinvoq

## 2022-11-16 NOTE — Progress Notes (Signed)
Pharmacy Note  Subjective:  Patient presents today to [facility] for follow up office visit.  Patient was seen by the pharmacist for counseling on Taltz for psoriatic arthritis and plaque psoriasis..  Prior therapy includes: Tremfya (last dose 11/15/22), MTX (current but experiencing GI side effects).  History of inflammatory bowel disease: No  Objective:  CBC    Component Value Date/Time   WBC 12.5 (H) 11/04/2022 1134   RBC 3.20 (L) 11/04/2022 1134   HGB 11.1 (L) 11/04/2022 1134   HGB 12.4 03/12/2019 1353   HGB 12.7 05/20/2016 1052   HCT 33.7 (L) 11/04/2022 1134   HCT 36.4 05/20/2016 1052   PLT 345 11/04/2022 1134   PLT 251 03/12/2019 1353   PLT 248 05/20/2016 1052   MCV 105.3 (H) 11/04/2022 1134   MCV 102.8 (H) 05/20/2016 1052   MCH 34.7 (H) 11/04/2022 1134   MCHC 32.9 11/04/2022 1134   RDW 12.2 11/04/2022 1134   RDW 13.2 05/20/2016 1052   LYMPHSABS 1,778 09/28/2022 1016   LYMPHSABS 1.3 05/20/2016 1052   MONOABS 0.9 03/12/2019 1353   MONOABS 0.6 05/20/2016 1052   EOSABS 50 11/04/2022 1134   EOSABS 0.1 05/20/2016 1052   BASOSABS 50 11/04/2022 1134   BASOSABS 0.0 05/20/2016 1052    CMP     Component Value Date/Time   NA 132 (L) 11/04/2022 1134   NA 141 05/20/2016 1052   K 4.3 11/04/2022 1134   K 3.8 05/20/2016 1052   CL 96 (L) 11/04/2022 1134   CO2 27 11/04/2022 1134   CO2 28 05/20/2016 1052   GLUCOSE 99 11/04/2022 1134   GLUCOSE 100 05/20/2016 1052   BUN 17 11/04/2022 1134   BUN 14.4 05/20/2016 1052   CREATININE 0.71 11/04/2022 1134   CREATININE 0.8 05/20/2016 1052   CALCIUM 9.5 11/04/2022 1134   CALCIUM 10.0 05/20/2016 1052   PROT 6.4 11/04/2022 1134   PROT 7.2 05/20/2016 1052   ALBUMIN 4.2 03/12/2019 1353   ALBUMIN 4.3 05/20/2016 1052   AST 18 11/04/2022 1134   AST 24 03/12/2019 1353   AST 26 05/20/2016 1052   ALT 15 11/04/2022 1134   ALT 27 03/12/2019 1353   ALT 29 05/20/2016 1052   ALKPHOS 77 03/12/2019 1353   ALKPHOS 69 05/20/2016  1052   BILITOT 0.5 11/04/2022 1134   BILITOT 0.4 03/12/2019 1353   BILITOT 0.56 05/20/2016 1052   GFRNONAA 86 06/17/2020 1045   GFRAA 100 06/17/2020 1045    Baseline Immunosuppressant Therapy Labs     Latest Ref Rng & Units 10/19/2022   10:08 AM  Quantiferon TB Gold  Quantiferon TB Gold Plus NEGATIVE NEGATIVE        Latest Ref Rng & Units 10/19/2022   10:08 AM  Hepatitis  Hep B Surface Ag NON-REACTIVE NON-REACTIVE   Hep B IgM NON-REACTIVE NON-REACTIVE   Hep C Ab NON-REACTIVE NON-REACTIVE     No results found for: "HIV"     Latest Ref Rng & Units 10/19/2022   10:08 AM  Immunoglobulin Electrophoresis  IgA  70 - 320 mg/dL 478   IgG 295 - 6,213 mg/dL 086   IgM 50 - 578 mg/dL 469        Latest Ref Rng & Units 11/04/2022   11:34 AM  Serum Protein Electrophoresis  Total Protein 6.1 - 8.1 g/dL 6.4     No results found for: "G6PDH"  No results found for: "TPMT"  Chest Xray: August 07, 2019  Assessment/Plan:  Counseled patient  that Altamease Oiler is a IL-17 inhibitor that works to reduce pain and inflammation associated with arthritis.  Counseled patient on purpose, proper use, and adverse effects of Taltz. Reviewed the most common adverse effects of infection (more commonly nasopharyngitis, URTI), inflammatory bowel disease, and allergic reaction. Counseled patient that Altamease Oiler should be held for infection and prior to scheduled surgery.  Counseled patient to avoid live vaccines while on Taltz. Recommend annual influenza, PCV 15 or PCV20 or Pneumovax 23, and Shingrix as indicated. Reviewed storage information for Taltz.  Reviewed the importance of regular labs while on Taltz. Will monitor CBC and CMP 1 month after starting and every 3 months routinely thereafter. Will monitor TB gold annually. Standing orders placed. Provided patient with medication education material and answered all questions.  Patient consented to Taltz.  Will upload consent into patient's chart.  Will apply for  Taltz through patient's insurance and update when we receive a response.  Advised initial injection must be administered in office.  Patient voiced understanding.    Taltz dose will be: For psoriatic arthritis and plaque psoriasis overlap load of 160 mg then 80 mg on weeks 2,4,6,8,10,12 then 80 mg every 28 days  Prescription will be sent to pharmacy pending lab results and insurance approval.   Changing from Tremfya (last dose 11/15/22) - she can start Taltz on or after 12/13/2022, MTX (current but experiencing GI side effects).  Patient completed her portion of Taltz PAP application at [facility]. Provider portion placed in Dr. Fatima Sanger folder for signature  Chesley Mires, PharmD, MPH, BCPS, CPP Clinical Pharmacist (Rheumatology and Pulmonology)

## 2022-11-17 ENCOUNTER — Telehealth: Payer: Self-pay

## 2022-11-17 ENCOUNTER — Other Ambulatory Visit: Payer: Self-pay | Admitting: Rheumatology

## 2022-11-17 DIAGNOSIS — Z79899 Other long term (current) drug therapy: Secondary | ICD-10-CM

## 2022-11-17 LAB — CBC WITH DIFFERENTIAL/PLATELET
Absolute Lymphocytes: 1627 {cells}/uL (ref 850–3900)
Absolute Monocytes: 522 {cells}/uL (ref 200–950)
Basophils Absolute: 26 {cells}/uL (ref 0–200)
Basophils Relative: 0.3 %
Eosinophils Absolute: 61 {cells}/uL (ref 15–500)
Eosinophils Relative: 0.7 %
HCT: 35.7 % (ref 35.0–45.0)
Hemoglobin: 12.2 g/dL (ref 11.7–15.5)
MCH: 35.5 pg — ABNORMAL HIGH (ref 27.0–33.0)
MCHC: 34.2 g/dL (ref 32.0–36.0)
MCV: 103.8 fL — ABNORMAL HIGH (ref 80.0–100.0)
MPV: 9.3 fL (ref 7.5–12.5)
Monocytes Relative: 6 %
Neutro Abs: 6464 {cells}/uL (ref 1500–7800)
Neutrophils Relative %: 74.3 %
Platelets: 320 10*3/uL (ref 140–400)
RBC: 3.44 10*6/uL — ABNORMAL LOW (ref 3.80–5.10)
RDW: 12.9 % (ref 11.0–15.0)
Total Lymphocyte: 18.7 %
WBC: 8.7 10*3/uL (ref 3.8–10.8)

## 2022-11-17 LAB — COMPLETE METABOLIC PANEL WITH GFR
AG Ratio: 1.6 (calc) (ref 1.0–2.5)
ALT: 46 U/L — ABNORMAL HIGH (ref 6–29)
AST: 50 U/L — ABNORMAL HIGH (ref 10–35)
Albumin: 4.4 g/dL (ref 3.6–5.1)
Alkaline phosphatase (APISO): 126 U/L (ref 37–153)
BUN: 20 mg/dL (ref 7–25)
CO2: 28 mmol/L (ref 20–32)
Calcium: 10.1 mg/dL (ref 8.6–10.4)
Chloride: 96 mmol/L — ABNORMAL LOW (ref 98–110)
Creat: 0.96 mg/dL (ref 0.50–1.05)
Globulin: 2.7 g/dL (ref 1.9–3.7)
Glucose, Bld: 104 mg/dL — ABNORMAL HIGH (ref 65–99)
Potassium: 4.2 mmol/L (ref 3.5–5.3)
Sodium: 135 mmol/L (ref 135–146)
Total Bilirubin: 0.7 mg/dL (ref 0.2–1.2)
Total Protein: 7.1 g/dL (ref 6.1–8.1)
eGFR: 65 mL/min/{1.73_m2} (ref >=60)

## 2022-11-17 NOTE — Progress Notes (Signed)
CBC normal except MCV remains elevated.  Patient should take folic acid on a regular basis.  CMP shows elevated LFTs.  Patient should avoid all NSAIDs and alcohol use.  Medication list shows ibuprofen, meloxicam and be started on methotrexate.  She should stop ibuprofen and meloxicam and then recheck labs in a month.  She should not increase the dose of methotrexate.  If her LFTs are still elevated we may have to change treatment plan.

## 2022-11-17 NOTE — Telephone Encounter (Addendum)
Received signed provider form from Dr. Corliss Skains for [employer] PAP. Scanned patient portion and provider portion to [employer] for PAP submission once PA is approved  Chesley Mires, PharmD, MPH, BCPS, CPP Clinical Pharmacist (Rheumatology and Pulmonology)

## 2022-11-17 NOTE — Telephone Encounter (Signed)
-----   Message from [facility] sent at 11/17/2022  8:32 AM EST ----- CBC normal except MCV remains elevated.  Patient should take folic acid on a regular basis.  CMP shows elevated LFTs.  Patient should avoid all NSAIDs and alcohol use.  Medication list shows ibuprofen, meloxicam and be started on methotrexate.  She s hould stop ibuprofen and meloxicam and then recheck labs in a month.  She should not increase the dose of methotrexate.  If her LFTs are still elevated we may have to change treatment plan.

## 2022-11-17 NOTE — Telephone Encounter (Signed)
Last Fill: 10/21/2022  Labs: 11/16/2022 CBC normal except MCV remains elevated. CMP shows elevated LFTs   Next Visit: 01/20/2023  Last Visit: 11/16/2022  DX: Psoriatic arthropathy   Current Dose per office note 11/16/2022: methotrexate 6 tablets p.o. weekly   Okay to refill Methotrexate?

## 2022-11-18 ENCOUNTER — Other Ambulatory Visit (HOSPITAL_COMMUNITY): Payer: Self-pay

## 2022-11-18 NOTE — Telephone Encounter (Signed)
Received notification from [facility] regarding a prior authorization for TALTZ. Authorization has been APPROVED from 11/18/22 to 01/04/24. Approval letter sent to scan center.  Per test claim, copay for 28 days supply is $100  Patient can fill through [facility]: [phone]   Authorization # [id number] Phone # [phone]  Submitted Patient Assistance Application to [facility] for TALTZ along with provider portion, patient portion, PA, medication list, insurance card copy and income documents. Will update patient when we receive a response.  Phone: [phone] Fax: [phone]  Chesley Mires, PharmD, MPH, BCPS, CPP Clinical Pharmacist (Rheumatology and Pulmonology)

## 2022-11-18 NOTE — Telephone Encounter (Signed)
Submitted a Prior Authorization request to [employer] for TALTZ via CoverMyMeds. Will update once we receive a response.  Key: [id number]   Chesley Mires, PharmD, MPH, BCPS, CPP Clinical Pharmacist (Rheumatology and Pulmonology)

## 2022-11-19 ENCOUNTER — Other Ambulatory Visit: Payer: Self-pay | Admitting: Family Medicine

## 2022-11-19 ENCOUNTER — Encounter: Payer: Self-pay | Admitting: Family Medicine

## 2022-11-19 NOTE — Telephone Encounter (Signed)
Received a fax from  [facility] regarding an approval for TALTZ patient assistance from 11/19/2022 to 01/04/2024. Approval letter sent to scan center.  Phone: [phone] Fax: [phone]  Patient can be scheduled for Taltz new start appt on or after 12/13/2022  Chesley Mires, PharmD, MPH, BCPS, CPP Clinical Pharmacist (Rheumatology and Pulmonology)

## 2022-11-22 ENCOUNTER — Encounter: Payer: Self-pay | Admitting: Gastroenterology

## 2022-11-23 ENCOUNTER — Other Ambulatory Visit: Payer: Self-pay | Admitting: Family Medicine

## 2022-11-23 ENCOUNTER — Telehealth: Payer: Self-pay | Admitting: *Deleted

## 2022-11-23 MED ORDER — HYDROCODONE-ACETAMINOPHEN 5-325 MG PO TABS: 1.0000 | ORAL_TABLET | Freq: Three times a day (TID) | ORAL | 0 refills | Status: DC | PRN

## 2022-11-23 NOTE — Telephone Encounter (Signed)
Patient contacted the office and left a message regarding her MTX and her liver functions.   Returned call to patient. She would like to know if she is supposed to continue the MTX since her liver functions are elevated. Patient advised it appears from the lab note that she is to continue the MTX but she is not to increase it. Patient has also been scheduled for her Altamease Oiler new start visit on 12/13/2022.

## 2022-11-23 NOTE — Telephone Encounter (Signed)
ATC patient regarding Altamease Oiler new start. Unable to reach. Left VM with my direct callback number  Chesley Mires, PharmD, MPH, BCPS, CPP Clinical Pharmacist (Rheumatology and Pulmonology)

## 2022-11-24 DIAGNOSIS — L400 Psoriasis vulgaris: Secondary | ICD-10-CM | POA: Diagnosis not present

## 2022-11-24 DIAGNOSIS — Z5181 Encounter for therapeutic drug level monitoring: Secondary | ICD-10-CM | POA: Diagnosis not present

## 2022-11-26 NOTE — Telephone Encounter (Signed)
Patient scheduled for Altamease Oiler new start on 12/13/2022

## 2022-12-06 ENCOUNTER — Other Ambulatory Visit: Payer: Self-pay | Admitting: Family Medicine

## 2022-12-06 ENCOUNTER — Other Ambulatory Visit: Payer: Self-pay | Admitting: Rheumatology

## 2022-12-07 ENCOUNTER — Telehealth: Payer: Self-pay

## 2022-12-07 ENCOUNTER — Other Ambulatory Visit: Payer: Self-pay

## 2022-12-07 MED ORDER — LOSARTAN POTASSIUM 50 MG PO TABS: ORAL_TABLET | ORAL | 0 refills | Status: DC

## 2022-12-07 NOTE — Telephone Encounter (Signed)
Requested medication (s) are due for refill today - yes  Requested medication (s) are on the active medication list -yes  Future visit scheduled -no  Last refill: 08/04/22 #30  Notes to clinic: non delegated Rx  Requested Prescriptions  Pending Prescriptions Disp Refills   cyclobenzaprine (FLEXERIL) 10 MG tablet [Pharmacy Med Name: CYCLOBENZAPRINE 10 MG TABLET] 30 tablet 0    Sig: TAKE 1 TABLET BY MOUTH THREE TIMES A DAY AS NEEDED FOR MUSCLE SPASM     Not Delegated - Analgesics:  Muscle Relaxants Failed - 12/06/2022  9:17 AM      Failed - This refill cannot be delegated      Failed - Valid encounter within last 6 months    Recent Outpatient Visits           2 years ago Acute viral conjunctivitis of left eye   [facility] [name], NP   2 years ago Pure hypercholesterolemia   [facility] [name], MD   3 years 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 [facility]               Requested Prescriptions  Pending Prescriptions Disp Refills   cyclobenzaprine (FLEXERIL) 10 MG tablet [Pharmacy Med Name: CYCLOBENZAPRINE 10 MG TABLET] 30 tablet 0    Sig: TAKE 1 TABLET BY MOUTH THREE TIMES A DAY AS NEEDED FOR MUSCLE SPASM     Not Delegated - Analgesics:  Muscle Relaxants Failed - 12/06/2022  9:17 AM      Failed - This refill cannot be delegated      Failed - Valid encounter within last 6 months    Recent Outpatient Visits           2 years ago Acute viral conjunctivitis of left eye   [facility] [name], NP   2 years ago  Pure hypercholesterolemia   [facility] [name], MD   3 years 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 [facility]

## 2022-12-07 NOTE — Telephone Encounter (Signed)
Pt has been given a courtesy refill back 10/24, which pt was told to make cpe appt.

## 2022-12-07 NOTE — Telephone Encounter (Signed)
Pt was given courtesy refill. Pt needs cpe appt for future refills.

## 2022-12-09 ENCOUNTER — Ambulatory Visit: Payer: Medicare PPO | Admitting: Gastroenterology

## 2022-12-09 ENCOUNTER — Encounter: Payer: Self-pay | Admitting: Gastroenterology

## 2022-12-09 VITALS — BP 157/80 | HR 73 | Temp 97.5°F | Resp 23 | Ht 63.0 in | Wt 115.0 lb

## 2022-12-09 DIAGNOSIS — Z1211 Encounter for screening for malignant neoplasm of colon: Secondary | ICD-10-CM | POA: Diagnosis not present

## 2022-12-09 DIAGNOSIS — E785 Hyperlipidemia, unspecified: Secondary | ICD-10-CM | POA: Diagnosis not present

## 2022-12-09 DIAGNOSIS — K635 Polyp of colon: Secondary | ICD-10-CM

## 2022-12-09 DIAGNOSIS — K5140 Inflammatory polyps of colon without complications: Secondary | ICD-10-CM | POA: Diagnosis not present

## 2022-12-09 DIAGNOSIS — I10 Essential (primary) hypertension: Secondary | ICD-10-CM | POA: Diagnosis not present

## 2022-12-09 DIAGNOSIS — F419 Anxiety disorder, unspecified: Secondary | ICD-10-CM | POA: Diagnosis not present

## 2022-12-09 DIAGNOSIS — K644 Residual hemorrhoidal skin tags: Secondary | ICD-10-CM

## 2022-12-09 DIAGNOSIS — D123 Benign neoplasm of transverse colon: Secondary | ICD-10-CM

## 2022-12-09 DIAGNOSIS — J449 Chronic obstructive pulmonary disease, unspecified: Secondary | ICD-10-CM | POA: Diagnosis not present

## 2022-12-09 DIAGNOSIS — K648 Other hemorrhoids: Secondary | ICD-10-CM

## 2022-12-09 MED ORDER — SODIUM CHLORIDE 0.9 % IV SOLN: 500.0000 mL | Freq: Once | INTRAVENOUS | Status: DC

## 2022-12-09 NOTE — Patient Instructions (Signed)
Thank you for letting us take care of your healthcare needs today. Please see handouts given to you on Polyps and Hemorrhoids.    YOU HAD AN ENDOSCOPIC PROCEDURE TODAY AT THE [facility]:   Refer to the procedure report that was given to you for any specific questions about what was found during the examination.  If the procedure report does not answer your questions, please call your gastroenterologist to clarify.  If you requested that your care partner not be given the details of your procedure findings, then the procedure report has been included in a sealed envelope for you to review at your convenience later.  YOU SHOULD EXPECT: Some feelings of bloating in the abdomen. Passage of more gas than usual.  Walking can help get rid of the air that was put into your GI tract during the procedure and reduce the bloating. If you had a lower endoscopy (such as a colonoscopy or flexible sigmoidoscopy) you may notice spotting of blood in your stool or on the toilet paper. If you underwent a bowel prep for your procedure, you may not have a normal bowel movement for a few days.  Please Note:  You might notice some irritation and congestion in your nose or some drainage.  This is from the oxygen used during your procedure.  There is no need for concern and it should clear up in a day or so.  SYMPTOMS TO REPORT IMMEDIATELY:  Following lower endoscopy (colonoscopy or flexible sigmoidoscopy):  Excessive amounts of blood in the stool  Significant tenderness or worsening of abdominal pains  Swelling of the abdomen that is new, acute  Fever of 100F or higher   For urgent or emergent issues, a gastroenterologist can be reached at any hour by calling [phone]. Do not use MyChart messaging for urgent concerns.    DIET:  We do recommend a small meal at first, but then you may proceed to your regular diet.  Drink plenty of fluids but you should avoid alcoholic beverages for 24  hours.  ACTIVITY:  You should plan to take it easy for the rest of today and you should NOT DRIVE or use heavy machinery until tomorrow (because of the sedation medicines used during the test).    FOLLOW UP: Our staff will call the number listed on your records the next business day following your procedure.  We will call around 7:15- 8:00 am to check on you and address any questions or concerns that you may have regarding the information given to you following your procedure. If we do not reach you, we will leave a message.     If any biopsies were taken you will be contacted by phone or by letter within the next 1-3 weeks.  Please call us at [phone] if you have not heard about the biopsies in 3 weeks.    SIGNATURES/CONFIDENTIALITY: You and/or your care partner have signed paperwork which will be entered into your electronic medical record.  These signatures attest to the fact that that the information above on your After Visit Summary has been reviewed and is understood.  Full responsibility of the confidentiality of this discharge information lies with you and/or your care-partner. 

## 2022-12-09 NOTE — Op Note (Signed)
[facility] Patient Name: Kim Ortega Procedure Date: 12/09/2022 10:49 AM MRN: [id number] Endoscopist: Napoleon Form , MD, [id number] Age: 67 Referring MD:  Date of Birth: 1955/11/10 Gender: Female Account #: [id number] Procedure:                Colonoscopy Indications:              Screening for colorectal malignant neoplasm Medicines:                Monitored Anesthesia Care Procedure:                Pre-Anesthesia Assessment:                           - Prior to the procedure, a History and Physical                            was performed, and patient medications and                            allergies were reviewed. The patient's tolerance of                            previous anesthesia was also reviewed. The risks                            and benefits of the procedure and the sedation                            options and risks were discussed with the patient.                            All questions were answered, and informed consent                            was obtained. Prior Anticoagulants: The patient has                            taken no anticoagulant or antiplatelet agents. ASA                            Grade Assessment: III - A patient with severe                            systemic disease. After reviewing the risks and                            benefits, the patient was deemed in satisfactory                            condition to undergo the procedure.                           After obtaining informed consent, the colonoscope  was passed under direct vision. Throughout the                            procedure, the patient's blood pressure, pulse, and                            oxygen saturations were monitored continuously. The                            Olympus Scope SN [id number] was introduced through the                            anus and advanced to the the cecum, identified by                             appendiceal orifice and ileocecal valve. The                            colonoscopy was performed without difficulty. The                            patient tolerated the procedure well. The quality                            of the bowel preparation was good. The ileocecal                            valve, appendiceal orifice, and rectum were                            photographed. Scope In: 11:10:22 AM Scope Out: 11:22:29 AM Scope Withdrawal Time: 0 hours 8 minutes 5 seconds  Total Procedure Duration: 0 hours 12 minutes 7 seconds  Findings:                 The perianal and digital rectal examinations were                            normal.                           Two sessile polyps were found in the transverse                            colon. The polyps were 7 to 9 mm in size. These                            polyps were removed with a cold snare. Resection                            and retrieval were complete.                           Non-bleeding external and internal hemorrhoids were  found during retroflexion. The hemorrhoids were                            medium-sized. Complications:            No immediate complications. Estimated Blood Loss:     Estimated blood loss was minimal. Impression:               - Two 7 to 9 mm polyps in the transverse colon,                            removed with a cold snare. Resected and retrieved.                           - Non-bleeding external and internal hemorrhoids. Recommendation:           - Patient has a contact number available for                            emergencies. The signs and symptoms of potential                            delayed complications were discussed with the                            patient. Return to normal activities tomorrow.                            Written discharge instructions were provided to the                            patient.                           - Resume  previous diet.                           - Continue present medications.                           - Await pathology results.                           - Repeat colonoscopy in 5-10 years for surveillance                            based on pathology results. Napoleon Form, MD 12/09/2022 11:30:59 AM This report has been signed electronically.

## 2022-12-09 NOTE — Progress Notes (Signed)
[facility] History and Physical   Primary Care Physician:  [name], MD   Reason for Procedure:  Colorectal cancer screening  Plan:    Screening colonoscopy with possible interventions as needed     HPI: Kim Ortega is a very pleasant 67 y.o. female here for screening colonoscopy. Denies any nausea, vomiting, abdominal pain, melena or bright red blood per rectum  The risks and benefits as well as alternatives of endoscopic procedure(s) have been discussed and reviewed. All questions answered. The patient agrees to proceed.    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 ,grade 1   COPD (chronic obstructive pulmonary disease) (HCC)    Episcleritis    Hormone replacement therapy    Hyperlipidemia    Hypertension    Left carotid artery stenosis    40-59% (01/2015)   Personal history of chemotherapy 2017   neoadjuvant   Personal history of radiation therapy 2018   Psoriasis    SIADH (syndrome of inappropriate ADH production) (HCC)    Smoker     Past Surgical History:  Procedure Laterality Date   ABDOMINAL HYSTERECTOMY     BREAST BIOPSY Left 06/2015   BREAST LUMPECTOMY Left 01/2016   BREAST LUMPECTOMY WITH RADIOACTIVE SEED AND SENTINEL LYMPH NODE BIOPSY Left 01/08/2016   Procedure: LEFT BREAST LUMPECTOMY WITH RADIOACTIVE SEED AND SENTINEL LYMPH NODE BIOPSY, BLUE DYE INJECTION;  Surgeon: Emelia Loron, MD;  Location: [facility];  Service: General;  Laterality: Left;   cervical acdf     C5-6- fusion with plates/screws "Dr. Newell Coral" '12   COLONOSCOPY WITH PROPOFOL N/A 04/16/2014   Procedure: COLONOSCOPY WITH PROPOFOL;  Surgeon: Charolett Bumpers, MD;  Location: [facility];  Service: Endoscopy;  Laterality: N/A;   COLPOSCOPY     laporotomy     Ovarian cyst   PORT-A-CATH REMOVAL  Right 01/08/2016   Procedure: REMOVAL PORT-A-CATH;  Surgeon: Emelia Loron, MD;  Location: [facility];  Service: General;  Laterality: Right;   PORTACATH PLACEMENT N/A 07/24/2015   Procedure: INSERTION PORT-A-CATH WITH [place];  Surgeon: Emelia Loron, MD;  Location: [facility];  Service: General;  Laterality: N/A;   ROBOTIC ASSISTED SALPINGO OOPHERECTOMY Bilateral 10/10/2018   Procedure: XI ROBOTIC ASSISTED SALPINGO OOPHORECTOMY;  Surgeon: Adolphus Birchwood, MD;  Location: [facility];  Service: Gynecology;  Laterality: Bilateral;   TONSILLECTOMY     child    Prior to Admission medications   Medication Sig Start Date End Date Taking? Authorizing Provider  ALPRAZolam Prudy Feeler) 0.5 MG tablet Take 0.5 mg by mouth 3 (three) times daily as needed for anxiety.   Yes [provider]  amLODipine (NORVASC) 5 MG tablet Take 1 tablet (5 mg total) by mouth daily. 06/21/22  Yes [name], MD  aspirin EC 81 MG tablet Take 81 mg by mouth daily.   Yes [provider]  atenolol (TENORMIN) 50 MG tablet Take 1 tablet (50 mg total) by mouth daily. Courtesy refill. Cpe needed for future refills 10/21/22  Yes [name], MD  atorvastatin (LIPITOR) 40 MG tablet TAKE 1 TABLET BY MOUTH EVERY DAY 08/04/22  Yes [name], MD  Calcium Carbonate-Vitamin D (CALCIUM-D PO) Take 1 tablet by mouth 2 (two) times daily.    Yes [provider]  fluticasone (  FLONASE) 50 MCG/ACT nasal spray SPRAY 2 SPRAYS INTO EACH NOSTRIL EVERY DAY 05/18/22  Yes [name], MD  folic acid (FOLVITE) 1 MG tablet Take 2 tablets (2 mg total) by mouth daily. 10/21/22  Yes Deveshwar, Janalyn Rouse, MD  HYDROcodone-acetaminophen (NORCO) 5-325 MG tablet Take 1 tablet by mouth every 8 (eight) hours as needed for moderate pain (pain score 4-6). 11/23/22  Yes [name], MD  losartan (COZAAR) 50 MG tablet TAKE 1 TABLET (50 MG) BY MOUTH IN THE MORNING AND AT BEDTIME 12/07/22  Yes [name], MD   methotrexate (RHEUMATREX) 2.5 MG tablet Take 6 tablets (15 mg total) by mouth once a week. 11/17/22  Yes Deveshwar, Janalyn Rouse, MD  Multiple Vitamin (MULTIVITAMIN WITH MINERALS) TABS tablet Take 1 tablet by mouth daily.   Yes [provider]  venlafaxine XR (EFFEXOR-XR) 37.5 MG 24 hr capsule TAKE 1 CAPSULE BY MOUTH DAILY WITH BREAKFAST. 09/21/22  Yes Serena Croissant, MD  clobetasol (OLUX) 0.05 % topical foam Apply topically. 01/25/21   [provider]  cyclobenzaprine (FLEXERIL) 10 MG tablet TAKE 1 TABLET BY MOUTH THREE TIMES A DAY AS NEEDED FOR MUSCLE SPASM Patient not taking: Reported on 12/09/2022 08/04/22   [name], MD  Guselkumab [facility]) 100 MG/ML SOPN Inject into the skin. Every 8 weeks Patient not taking: Reported on 12/09/2022    [provider]  hydrochlorothiazide (HYDRODIURIL) 25 MG tablet Take 1 tablet (25 mg total) by mouth daily. 10/18/22   [name], MD  ibuprofen (ADVIL) 600 MG tablet Take 1 tablet (600 mg total) by mouth every 6 (six) hours as needed for moderate pain. For AFTER surgery only Patient not taking: Reported on 12/09/2022 09/29/18   Warner Mccreedy D, NP  Ketotifen Fumarate (ALLERGY EYE DROPS OP) Place 1 drop into both eyes 2 (two) times daily as needed (allergies).  Patient not taking: Reported on 12/09/2022    [provider]  meloxicam (MOBIC) 15 MG tablet TAKE 1 TABLET (15 MG TOTAL) BY MOUTH DAILY. Patient not taking: Reported on 11/10/2022 02/25/21   [name], MD  predniSONE (DELTASONE) 5 MG tablet Take 2 tabs by mouth daily x 1 week, then 1.5 tabs daily x 1 week, then 1 tab daily x 1 week, then half tablet daily x 1 week. Patient not taking: Reported on 11/16/2022 10/19/22   Pollyann Savoy, MD    Current Outpatient Medications  Medication Sig Dispense Refill   ALPRAZolam (XANAX) 0.5 MG tablet Take 0.5 mg by mouth 3 (three) times daily as needed for anxiety.     amLODipine (NORVASC) 5 MG tablet Take 1 tablet  (5 mg total) by mouth daily. 90 tablet 3   aspirin EC 81 MG tablet Take 81 mg by mouth daily.     atenolol (TENORMIN) 50 MG tablet Take 1 tablet (50 mg total) by mouth daily. Courtesy refill. Cpe needed for future refills 30 tablet 0   atorvastatin (LIPITOR) 40 MG tablet TAKE 1 TABLET BY MOUTH EVERY DAY 90 tablet 2   Calcium Carbonate-Vitamin D (CALCIUM-D PO) Take 1 tablet by mouth 2 (two) times daily.      fluticasone (FLONASE) 50 MCG/ACT nasal spray SPRAY 2 SPRAYS INTO EACH NOSTRIL EVERY DAY 48 mL 2   folic acid (FOLVITE) 1 MG tablet Take 2 tablets (2 mg total) by mouth daily. 180 tablet 3   HYDROcodone-acetaminophen (NORCO) 5-325 MG tablet Take 1 tablet by mouth every 8 (eight) hours as needed for moderate pain (pain  score 4-6). 30 tablet 0   losartan (COZAAR) 50 MG tablet TAKE 1 TABLET (50 MG) BY MOUTH IN THE MORNING AND AT BEDTIME 30 tablet 0   methotrexate (RHEUMATREX) 2.5 MG tablet Take 6 tablets (15 mg total) by mouth once a week. 72 tablet 0   Multiple Vitamin (MULTIVITAMIN WITH MINERALS) TABS tablet Take 1 tablet by mouth daily.     venlafaxine XR (EFFEXOR-XR) 37.5 MG 24 hr capsule TAKE 1 CAPSULE BY MOUTH DAILY WITH BREAKFAST. 90 capsule 2   clobetasol (OLUX) 0.05 % topical foam Apply topically.     cyclobenzaprine (FLEXERIL) 10 MG tablet TAKE 1 TABLET BY MOUTH THREE TIMES A DAY AS NEEDED FOR MUSCLE SPASM (Patient not taking: Reported on 12/09/2022) 30 tablet 0   Guselkumab (TREMFYA) 100 MG/ML SOPN Inject into the skin. Every 8 weeks (Patient not taking: Reported on 12/09/2022)     hydrochlorothiazide (HYDRODIURIL) 25 MG tablet Take 1 tablet (25 mg total) by mouth daily. 90 tablet 1   ibuprofen (ADVIL) 600 MG tablet Take 1 tablet (600 mg total) by mouth every 6 (six) hours as needed for moderate pain. For AFTER surgery only (Patient not taking: Reported on 12/09/2022) 30 tablet 0   Ketotifen Fumarate (ALLERGY EYE DROPS OP) Place 1 drop into both eyes 2 (two) times daily as needed (allergies).   (Patient not taking: Reported on 12/09/2022)     meloxicam (MOBIC) 15 MG tablet TAKE 1 TABLET (15 MG TOTAL) BY MOUTH DAILY. (Patient not taking: Reported on 11/10/2022) 30 tablet 0   predniSONE (DELTASONE) 5 MG tablet Take 2 tabs by mouth daily x 1 week, then 1.5 tabs daily x 1 week, then 1 tab daily x 1 week, then half tablet daily x 1 week. (Patient not taking: Reported on 11/16/2022) 35 tablet 0   Current Facility-Administered Medications  Medication Dose Route Frequency Provider Last Rate Last Admin   0.9 %  sodium chloride infusion  500 mL Intravenous Once Napoleon Form, MD        Allergies as of 12/09/2022   (No Known Allergies)    Family History  Problem Relation Age of Onset   Stroke Mother    Colon polyps Sister    Colon cancer Brother    Esophageal cancer Neg Hx    Rectal cancer Neg Hx    Stomach cancer Neg Hx     Social History   Socioeconomic History   Marital status: Married    Spouse name: Micheal   Number of children: 0   Years of education: Not on file   Highest education level: Not on file  Occupational History   Not on file  Tobacco Use   Smoking status: Every Day    Current packs/day: 0.50    Average packs/day: 0.5 packs/day for 20.0 years (10.0 ttl pk-yrs)    Types: Cigarettes    Passive exposure: Never   Smokeless tobacco: Never  Vaping Use   Vaping status: Some Days  Substance and Sexual Activity   Alcohol use: Yes    Alcohol/week: 10.0 standard drinks of alcohol    Types: 10 Glasses of wine per week    Comment: Glasses of wine several times a week   Drug use: No   Sexual activity: Yes    Birth control/protection: Surgical    Comment: married  Other Topics Concern   Not on file  Social History Narrative   Married x 25 years.   Family lives nearby.   Social Determinants of Health  Financial Resource Strain: Low Risk  (04/15/2022)   Overall Financial Resource Strain (CARDIA)    Difficulty of Paying Living Expenses: Not hard at all   Food Insecurity: No Food Insecurity (04/15/2022)   Hunger Vital Sign    Worried About Running Out of Food in the Last Year: Never true    Ran Out of Food in the Last Year: Never true  Transportation Needs: No Transportation Needs (04/15/2022)   PRAPARE - Administrator, Civil Service (Medical): No    Lack of Transportation (Non-Medical): No  Physical Activity: Insufficiently Active (04/15/2022)   Exercise Vital Sign    Days of Exercise per Week: 3 days    Minutes of Exercise per Session: 30 min  Stress: No Stress Concern Present (04/15/2022)   [employer] of Occupational Health - Occupational Stress Questionnaire    Feeling of Stress : Not at all  Social Connections: Socially Integrated (04/15/2022)   Social Connection and Isolation Panel [NHANES]    Frequency of Communication with Friends and Family: More than three times a week    Frequency of Social Gatherings with Friends and Family: Three times a week    Attends Religious Services: More than 4 times per year    Active Member of Clubs or Organizations: Yes    Attends Banker Meetings: More than 4 times per year    Marital Status: Married  Catering manager Violence: Not At Risk (04/15/2022)   Humiliation, Afraid, Rape, and Kick questionnaire    Fear of Current or Ex-Partner: No    Emotionally Abused: No    Physically Abused: No    Sexually Abused: No    Review of Systems:  All other review of systems negative except as mentioned in the HPI.  Physical Exam: Vital signs in last 24 hours: BP 124/79   Pulse 82   Temp (!) 97.5 F (36.4 C)   Ht 5\' 3"  (1.6 m)   Wt 115 lb (52.2 kg)   SpO2 97%   BMI 20.37 kg/m  General:   Alert, NAD Lungs:  Clear .   Heart:  Regular rate and rhythm Abdomen:  Soft, nontender and nondistended. Neuro/Psych:  Alert and cooperative. Normal mood and affect. A and O x 3  Reviewed labs, radiology imaging, old records and pertinent past GI work up  Patient is  appropriate for planned procedure(s) and anesthesia in an ambulatory setting   K. Scherry Ran , MD [phone]

## 2022-12-09 NOTE — Progress Notes (Signed)
Called to room to assist during endoscopic procedure.  Patient ID and intended procedure confirmed with present staff. Received instructions for my participation in the procedure from the performing physician.  

## 2022-12-09 NOTE — Progress Notes (Signed)
Report given to PACU, vss 

## 2022-12-10 ENCOUNTER — Other Ambulatory Visit: Payer: Self-pay

## 2022-12-10 ENCOUNTER — Telehealth: Payer: Self-pay

## 2022-12-10 DIAGNOSIS — I10 Essential (primary) hypertension: Secondary | ICD-10-CM

## 2022-12-10 MED ORDER — ATENOLOL 50 MG PO TABS: 50.0000 mg | ORAL_TABLET | Freq: Every day | ORAL | 0 refills | Status: DC

## 2022-12-10 NOTE — Telephone Encounter (Signed)
Follow up call to pt, no answer. 

## 2022-12-13 ENCOUNTER — Ambulatory Visit: Payer: Medicare PPO | Attending: Rheumatology | Admitting: Pharmacist

## 2022-12-13 DIAGNOSIS — L4050 Arthropathic psoriasis, unspecified: Secondary | ICD-10-CM

## 2022-12-13 DIAGNOSIS — L409 Psoriasis, unspecified: Secondary | ICD-10-CM

## 2022-12-13 DIAGNOSIS — Z79899 Other long term (current) drug therapy: Secondary | ICD-10-CM | POA: Diagnosis not present

## 2022-12-13 DIAGNOSIS — Z7189 Other specified counseling: Secondary | ICD-10-CM

## 2022-12-13 MED ORDER — TALTZ 80 MG/ML ~~LOC~~ SOAJ: SUBCUTANEOUS | 2 refills | Status: DC

## 2022-12-13 MED ORDER — TALTZ 80 MG/ML ~~LOC~~ SOAJ: 80.0000 mg | SUBCUTANEOUS | 0 refills | Status: DC

## 2022-12-13 MED ORDER — HYDROCODONE-ACETAMINOPHEN 5-325 MG PO TABS: 1.0000 | ORAL_TABLET | Freq: Three times a day (TID) | ORAL | 0 refills | Status: DC | PRN

## 2022-12-13 NOTE — Patient Instructions (Addendum)
Your next TALTZ dose is due on 12/27/22, 01/10/23, 01/24/23, 02/07/23, 02/21/23, 03/07/23 then every 4 weeks thereafter (starting on 04/04/23)  CONTINUE methotrexate 15mg  (6 tablets) by mouth once weekly with folic acid 2mg  daily . Will call with lab results. Minimize alcohol use to one drink weekly. Minimize Tylenol and NSAID use  HOLD TALTZ and METHOTREXATE if you have signs or symptoms of an infection. You can resume once you feel better or back to your baseline. HOLD TALTZ and METHOTREXATE if you start antibiotics to treat an infection. HOLD TALTZ and METHOTREXATE around the time of surgery/procedures. Your surgeon will be able to provide recommendations on when to hold BEFORE and when you are cleared to RESUME.  Pharmacy information: Your prescription will be shipped from [facility]. Their phone number is [phone] Please call to schedule shipment and confirm address. They will mail your medication to your home.  Labs are due in 1 month then every 3 months. Lab hours are from Monday to Thursday 8am-12:30pm and 1pm-5pm and Friday 8am-12pm. You do not need an appointment if you come for labs during these times. If you'd like to go to a [facility] or [facility] closer to home, please call our clinic 48 hours prior to lab date so we can release orders in a timely manner.  Stay up to date on all routine vaccines: influenza, pneumonia, COVID19, Shingles  How to manage an injection site reaction: Remember the 5 C's: COUNTER - leave on the counter at least 30 minutes but up to overnight to bring medication to room temperature. This may help prevent stinging COLD - place something cold (like an ice gel pack or cold water bottle) on the injection site just before cleansing with alcohol. This may help reduce pain CLARITIN - use Claritin (generic name is loratadine) for the first two weeks of treatment or the day of, the day before, and the day after injecting. This will help to minimize injection site  reactions CORTISONE CREAM - apply if injection site is irritated and itching CALL ME - if injection site reaction is bigger than the size of your fist, looks infected, blisters, or if you develop hives

## 2022-12-13 NOTE — Progress Notes (Signed)
Pharmacy Note  Subjective:   Patient presents to clinic today to receive first dose of Taltz for psoriatic arthritis and psoriasis. She is switching from Tremfya (last dose was on 11/15/2022). She takes methotrexate 15mg  p.o. once weekly with folic acid 2mg  daily  Patient running a fever or have signs/symptoms of infection? No  Patient currently on antibiotics for the treatment of infection? No  Patient have any upcoming invasive procedures/surgeries? No  Objective: CMP     Component Value Date/Time   NA 135 11/16/2022 1153   NA 141 05/20/2016 1052   K 4.2 11/16/2022 1153   K 3.8 05/20/2016 1052   CL 96 (L) 11/16/2022 1153   CO2 28 11/16/2022 1153   CO2 28 05/20/2016 1052   GLUCOSE 104 (H) 11/16/2022 1153   GLUCOSE 100 05/20/2016 1052   BUN 20 11/16/2022 1153   BUN 14.4 05/20/2016 1052   CREATININE 0.96 11/16/2022 1153   CREATININE 0.8 05/20/2016 1052   CALCIUM 10.1 11/16/2022 1153   CALCIUM 10.0 05/20/2016 1052   PROT 7.1 11/16/2022 1153   PROT 7.2 05/20/2016 1052   ALBUMIN 4.2 03/12/2019 1353   ALBUMIN 4.3 05/20/2016 1052   AST 50 (H) 11/16/2022 1153   AST 24 03/12/2019 1353   AST 26 05/20/2016 1052   ALT 46 (H) 11/16/2022 1153   ALT 27 03/12/2019 1353   ALT 29 05/20/2016 1052   ALKPHOS 77 03/12/2019 1353   ALKPHOS 69 05/20/2016 1052   BILITOT 0.7 11/16/2022 1153   BILITOT 0.4 03/12/2019 1353   BILITOT 0.56 05/20/2016 1052   GFRNONAA 86 06/17/2020 1045   GFRAA 100 06/17/2020 1045    CBC    Component Value Date/Time   WBC 8.7 11/16/2022 1153   RBC 3.44 (L) 11/16/2022 1153   HGB 12.2 11/16/2022 1153   HGB 12.4 03/12/2019 1353   HGB 12.7 05/20/2016 1052   HCT 35.7 11/16/2022 1153   HCT 36.4 05/20/2016 1052   PLT 320 11/16/2022 1153   PLT 251 03/12/2019 1353   PLT 248 05/20/2016 1052   MCV 103.8 (H) 11/16/2022 1153   MCV 102.8 (H) 05/20/2016 1052   MCH 35.5 (H) 11/16/2022 1153   MCHC 34.2 11/16/2022 1153   RDW 12.9 11/16/2022 1153   RDW 13.2 05/20/2016  1052   LYMPHSABS 1,778 09/28/2022 1016   LYMPHSABS 1.3 05/20/2016 1052   MONOABS 0.9 03/12/2019 1353   MONOABS 0.6 05/20/2016 1052   EOSABS 61 11/16/2022 1153   EOSABS 0.1 05/20/2016 1052   BASOSABS 26 11/16/2022 1153   BASOSABS 0.0 05/20/2016 1052    Baseline Immunosuppressant Therapy Labs TB GOLD    Latest Ref Rng & Units 10/19/2022   10:08 AM  Quantiferon TB Gold  Quantiferon TB Gold Plus NEGATIVE NEGATIVE    Hepatitis Panel    Latest Ref Rng & Units 10/19/2022   10:08 AM  Hepatitis  Hep B Surface Ag NON-REACTIVE NON-REACTIVE   Hep B IgM NON-REACTIVE NON-REACTIVE   Hep C Ab NON-REACTIVE NON-REACTIVE    HIV No results found for: "HIV" Immunoglobulins    Latest Ref Rng & Units 10/19/2022   10:08 AM  Immunoglobulin Electrophoresis  IgA  70 - 320 mg/dL 161   IgG 096 - 0,454 mg/dL 098   IgM 50 - 119 mg/dL 147    SPEP    Latest Ref Rng & Units 11/16/2022   11:53 AM  Serum Protein Electrophoresis  Total Protein 6.1 - 8.1 g/dL 7.1    Chest x-ray: 08/05/9560 - COPD  without acute abnormality.  Assessment/Plan:  Reviewed importance of holding Taltz with signs/symptoms of an infections, if antibiotics are prescribed to treat an active infection, and with invasive procedures  Demonstrated proper injection technique with Taltz demo device  Patient able to demonstrate proper injection technique using the teach back method.  Patient self injected in the right upper thigh and left upper thighwith:  Sample Medication: Taltz 80mg /mL autoinjector pen x 2 pens = 160mg  total dose Lot: [id number] Expiration: 06/15/2024  Patient tolerated well.  Observed for 30 mins in office for adverse reaction. Patient denies itchiness and irritation at injection., No swelling or redness noted., and Reviewed injection site reaction management with patient verbally and printed information for review in AVS  Patient is to return in 1 month for labs and 6-8 weeks for follow-up appointment.  Standing  orders for CBC/CMP remain in place.  TB gold will be monitored yearly.   Taltz approved through patient assistance .   Rx sent to: [facility] for Taltz: [phone].  Patient provided with pharmacy phone number and advised to call later this week to schedule shipment to home.  Patient will continue Taltz 160 mg SQ at Week 0 (administered in clinic today), then 80 mg on weeks 2, 4, 6, 8, 10, 12 then 80 mg every 28 days. She will also continue methotrexate 15mg  (6 tablets) by mouth once weekly with folic acid 2mg  daily  All questions encouraged and answered.  Instructed patient to call with any further questions or concerns.  CMET repeated today (due to elevated LFTs with past draw). Emphasized importance of avoiding NSAIDs, alcohol, and acetaminophen use  Chesley Mires, PharmD, MPH, BCPS, CPP Clinical Pharmacist (Rheumatology and Pulmonology)  12/13/2022 9:37 AM

## 2022-12-14 LAB — COMPLETE METABOLIC PANEL WITH GFR
AG Ratio: 1.3 (calc) (ref 1.0–2.5)
ALT: 12 U/L (ref 6–29)
AST: 19 U/L (ref 10–35)
Albumin: 3.9 g/dL (ref 3.6–5.1)
Alkaline phosphatase (APISO): 92 U/L (ref 37–153)
BUN: 13 mg/dL (ref 7–25)
CO2: 25 mmol/L (ref 20–32)
Calcium: 9.8 mg/dL (ref 8.6–10.4)
Chloride: 99 mmol/L (ref 98–110)
Creat: 0.67 mg/dL (ref 0.50–1.05)
Globulin: 2.9 g/dL (ref 1.9–3.7)
Glucose, Bld: 77 mg/dL (ref 65–99)
Potassium: 4.7 mmol/L (ref 3.5–5.3)
Sodium: 136 mmol/L (ref 135–146)
Total Bilirubin: 0.2 mg/dL (ref 0.2–1.2)
Total Protein: 6.8 g/dL (ref 6.1–8.1)
eGFR: 96 mL/min/{1.73_m2} (ref >=60)

## 2022-12-14 LAB — SURGICAL PATHOLOGY

## 2022-12-14 NOTE — Progress Notes (Signed)
CMP is normal.

## 2022-12-16 ENCOUNTER — Telehealth: Payer: Self-pay | Admitting: *Deleted

## 2022-12-16 NOTE — Telephone Encounter (Signed)
Patient contacted the office and left message asking if she can use topical Voltaren Gel. Patient states she is on Taltz and MTX.  Returned call to patient to advise she can use Voltaren Gel.

## 2022-12-17 ENCOUNTER — Other Ambulatory Visit: Payer: Self-pay | Admitting: Family Medicine

## 2022-12-17 ENCOUNTER — Other Ambulatory Visit: Payer: Self-pay | Admitting: Rheumatology

## 2022-12-17 ENCOUNTER — Encounter: Payer: Self-pay | Admitting: Family Medicine

## 2023-01-06 NOTE — Progress Notes (Deleted)
 Office Visit Note  Patient: Kim Ortega             Date of Birth: June 30, 1955           MRN: [id number]             PCP: [name], MD Referring: [name], MD Visit Date: 01/20/2023 Occupation: @GUAROCC @  Subjective:    History of Present Illness: Jaziah Kwasnik is a 68 y.o. female with history of psoriatic arthritis and episcleritis. Patient remains on methotrexate 6 tablets p.o. weekly and folic acid 1mg  po every day.     CMP updated on 12/13/22.   CBC updated on 11/16/22.   Discussed the importance of holding methotrexate if she develops signs or symptoms of an infection and to resume once the infection has completely cleared.   Activities of Daily Living:  Patient reports morning stiffness for *** {minute/hour:19697}.   Patient {ACTIONS;DENIES/REPORTS:21021675::"Denies"} nocturnal pain.  Difficulty dressing/grooming: {ACTIONS;DENIES/REPORTS:21021675::"Denies"} Difficulty climbing stairs: {ACTIONS;DENIES/REPORTS:21021675::"Denies"} Difficulty getting out of chair: {ACTIONS;DENIES/REPORTS:21021675::"Denies"} Difficulty using hands for taps, buttons, cutlery, and/or writing: {ACTIONS;DENIES/REPORTS:21021675::"Denies"}  No Rheumatology ROS completed.   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   Antineoplastic chemotherapy induced anemia 08/27/2015   Breast cancer of lower-inner quadrant of left female breast (HCC) 07/03/2015   Left carotid artery stenosis    Psoriasis    Hormone replacement therapy    Smoker    Episcleritis     Past Medical  History:  Diagnosis Date   Allergy    Anxiety    mild no regular meds   Arthritis    psoriatic arthritis -back   Breast cancer (HCC) 06/25/15 bx   left breastinvasive ductal ca ,grade 1   COPD (chronic obstructive pulmonary disease) (HCC)    Episcleritis    Hormone replacement therapy    Hyperlipidemia    Hypertension    Left carotid artery stenosis    40-59% (01/2015)   Personal history of chemotherapy 2017   neoadjuvant   Personal history of radiation therapy 2018   Psoriasis    SIADH (syndrome of inappropriate ADH production) (HCC)    Smoker     Family History  Problem Relation Age of Onset   Stroke Mother    Colon polyps Sister    Colon cancer Brother    Esophageal cancer Neg Hx    Rectal cancer Neg Hx    Stomach cancer Neg Hx    Past Surgical History:  Procedure Laterality Date   ABDOMINAL HYSTERECTOMY     BREAST BIOPSY Left 06/2015   BREAST LUMPECTOMY Left 01/2016   BREAST LUMPECTOMY WITH RADIOACTIVE SEED AND SENTINEL LYMPH NODE BIOPSY Left 01/08/2016   Procedure: LEFT BREAST LUMPECTOMY WITH RADIOACTIVE SEED AND SENTINEL LYMPH NODE BIOPSY, BLUE DYE INJECTION;  Surgeon: Emelia Loron, MD;  Location: [facility];  Service: General;  Laterality: Left;   cervical acdf     C5-6- fusion with plates/screws "Dr. Newell Coral" '12   COLONOSCOPY WITH PROPOFOL N/A 04/16/2014   Procedure: COLONOSCOPY WITH PROPOFOL;  Surgeon: Charolett Bumpers, MD;  Location: [facility];  Service: Endoscopy;  Laterality:  N/A;   COLPOSCOPY     laporotomy     Ovarian cyst   PORT-A-CATH REMOVAL Right 01/08/2016   Procedure: REMOVAL PORT-A-CATH;  Surgeon: Emelia Loron, MD;  Location: [facility];  Service: General;  Laterality: Right;   PORTACATH PLACEMENT N/A 07/24/2015   Procedure: INSERTION PORT-A-CATH WITH [place];  Surgeon: Emelia Loron, MD;  Location: [facility];  Service: General;  Laterality: N/A;   ROBOTIC ASSISTED SALPINGO OOPHERECTOMY Bilateral 10/10/2018    Procedure: XI ROBOTIC ASSISTED SALPINGO OOPHORECTOMY;  Surgeon: Adolphus Birchwood, MD;  Location: [facility];  Service: Gynecology;  Laterality: Bilateral;   TONSILLECTOMY     child   Social History   Social History Narrative   Married x 25 years.   Family lives nearby.   Immunization History  Administered Date(s) Administered   Influenza Split 11/04/2012   Influenza, Seasonal, Injecte, Preservative Fre 09/16/2022   Influenza,inj,Quad PF,6+ Mos 10/25/2016, 09/20/2018   Influenza-Unspecified 11/04/2013   MODERNA COVID-19 SARS-COV-2 PEDS BIVALENT BOOSTER 80yr-73yr 03/27/2019, 04/24/2019   Pneumococcal Polysaccharide-23 10/25/2016   Tdap 07/04/2009     Objective: Vital Signs: There were no vitals taken for this visit.   Physical Exam Vitals and nursing note reviewed.  Constitutional:      Appearance: She is well-developed.  HENT:     Head: Normocephalic and atraumatic.  Eyes:     Conjunctiva/sclera: Conjunctivae normal.  Cardiovascular:     Rate and Rhythm: Normal rate and regular rhythm.     Heart sounds: Normal heart sounds.  Pulmonary:     Effort: Pulmonary effort is normal.     Breath sounds: Normal breath sounds.  Abdominal:     General: Bowel sounds are normal.     Palpations: Abdomen is soft.  Musculoskeletal:     Cervical back: Normal range of motion.  Lymphadenopathy:     Cervical: No cervical adenopathy.  Skin:    General: Skin is warm and dry.     Capillary Refill: Capillary refill takes less than 2 seconds.  Neurological:     Mental Status: She is alert and oriented to person, place, and time.  Psychiatric:        Behavior: Behavior normal.      Musculoskeletal Exam: ***  CDAI Exam: CDAI Score: -- Patient Global: --; Provider Global: -- Swollen: --; Tender: -- Joint Exam 01/20/2023   No joint exam has been documented for this visit   There is currently no information documented on the homunculus. Go to the Rheumatology activity and complete the  homunculus joint exam.  Investigation: No additional findings.  Imaging: No results found.  Recent Labs: Lab Results  Component Value Date   WBC 8.7 11/16/2022   HGB 12.2 11/16/2022   PLT 320 11/16/2022   NA 136 12/13/2022   K 4.7 12/13/2022   CL 99 12/13/2022   CO2 25 12/13/2022   GLUCOSE 77 12/13/2022   BUN 13 12/13/2022   CREATININE 0.67 12/13/2022   BILITOT 0.2 12/13/2022   ALKPHOS 77 03/12/2019   AST 19 12/13/2022   ALT 12 12/13/2022   PROT 6.8 12/13/2022   ALBUMIN 4.2 03/12/2019   CALCIUM 9.8 12/13/2022   GFRAA 100 06/17/2020   QFTBGOLDPLUS NEGATIVE 10/19/2022    Speciality Comments: Tremfya stopped 11/15/2022, Taltz started 12/13/2022  Procedures:  No procedures performed Allergies: Patient has no known allergies.   Assessment / Plan:     Visit Diagnoses: Psoriatic arthropathy (HCC)  Psoriasis  High risk medication use  Rheumatoid factor positive  Episcleritis  of left eye  Swelling of right wrist  Pain in both hands  Trochanteric bursitis of both hips  S/P cervical spinal fusion  DDD (degenerative disc disease), cervical  Spondylosis of lumbar spine  Malignant neoplasm of lower-inner quadrant of left breast in female, estrogen receptor positive (HCC)  Chemotherapy-induced peripheral neuropathy (HCC)  Antineoplastic chemotherapy induced anemia  Hormone replacement therapy  SIADH (syndrome of inappropriate ADH production) (HCC)  Left carotid artery stenosis  Smoker  Orders: No orders of the defined types were placed in this encounter.  No orders of the defined types were placed in this encounter.   Face-to-face time spent with patient was *** minutes. Greater than 50% of time was spent in counseling and coordination of care.  Follow-Up Instructions: No follow-ups on file.   Gearldine Bienenstock, PA-C  Note - This record has been created using Dragon software.  Chart creation errors have been sought, but may not always  have been  located. Such creation errors do not reflect on  the standard of medical care.

## 2023-01-07 ENCOUNTER — Telehealth: Payer: Self-pay | Admitting: Pharmacist

## 2023-01-07 NOTE — Telephone Encounter (Signed)
 Received fax from [facility] pharmacy requesting maintenance dose for Taltz  rx. Spoke with rep - patient has not filled Taltz  since starting medication in clinic meaning she is overdue for Week 2 dose that was due on 12/27/2022. Per rep, they ATC patient but have not received return call.  ATC patient left VM with callback number for pharmacy [phone]). Patient needs to call back asap to prevent further interruption in treatment  Sherry Pennant, PharmD, MPH, BCPS, CPP Clinical Pharmacist (Rheumatology and Pulmonology)

## 2023-01-09 ENCOUNTER — Other Ambulatory Visit: Payer: Self-pay | Admitting: Family Medicine

## 2023-01-09 DIAGNOSIS — I10 Essential (primary) hypertension: Secondary | ICD-10-CM

## 2023-01-11 ENCOUNTER — Other Ambulatory Visit: Payer: Self-pay | Admitting: Family Medicine

## 2023-01-11 MED ORDER — HYDROCODONE-ACETAMINOPHEN 5-325 MG PO TABS: 1.0000 | ORAL_TABLET | Freq: Three times a day (TID) | ORAL | 0 refills | Status: DC | PRN

## 2023-01-12 ENCOUNTER — Encounter: Payer: Self-pay | Admitting: Gastroenterology

## 2023-01-20 ENCOUNTER — Ambulatory Visit: Payer: Medicare PPO | Admitting: Physician Assistant

## 2023-01-20 DIAGNOSIS — E222 Syndrome of inappropriate secretion of antidiuretic hormone: Secondary | ICD-10-CM

## 2023-01-20 DIAGNOSIS — R768 Other specified abnormal immunological findings in serum: Secondary | ICD-10-CM

## 2023-01-20 DIAGNOSIS — F17200 Nicotine dependence, unspecified, uncomplicated: Secondary | ICD-10-CM

## 2023-01-20 DIAGNOSIS — M25431 Effusion, right wrist: Secondary | ICD-10-CM

## 2023-01-20 DIAGNOSIS — I6522 Occlusion and stenosis of left carotid artery: Secondary | ICD-10-CM

## 2023-01-20 DIAGNOSIS — L409 Psoriasis, unspecified: Secondary | ICD-10-CM

## 2023-01-20 DIAGNOSIS — M79641 Pain in right hand: Secondary | ICD-10-CM

## 2023-01-20 DIAGNOSIS — Z170 Estrogen receptor positive status [ER+]: Secondary | ICD-10-CM

## 2023-01-20 DIAGNOSIS — M5030 Other cervical disc degeneration, unspecified cervical region: Secondary | ICD-10-CM

## 2023-01-20 DIAGNOSIS — Z79890 Hormone replacement therapy (postmenopausal): Secondary | ICD-10-CM

## 2023-01-20 DIAGNOSIS — G620 Drug-induced polyneuropathy: Secondary | ICD-10-CM

## 2023-01-20 DIAGNOSIS — Z79899 Other long term (current) drug therapy: Secondary | ICD-10-CM

## 2023-01-20 DIAGNOSIS — M47816 Spondylosis without myelopathy or radiculopathy, lumbar region: Secondary | ICD-10-CM

## 2023-01-20 DIAGNOSIS — T451X5A Adverse effect of antineoplastic and immunosuppressive drugs, initial encounter: Secondary | ICD-10-CM

## 2023-01-20 DIAGNOSIS — M7061 Trochanteric bursitis, right hip: Secondary | ICD-10-CM

## 2023-01-20 DIAGNOSIS — H15102 Unspecified episcleritis, left eye: Secondary | ICD-10-CM

## 2023-01-20 DIAGNOSIS — Z981 Arthrodesis status: Secondary | ICD-10-CM

## 2023-01-20 DIAGNOSIS — L4050 Arthropathic psoriasis, unspecified: Secondary | ICD-10-CM

## 2023-01-25 NOTE — Progress Notes (Signed)
 Office Visit Note  Patient: Kim Ortega             Date of Birth: 14-Oct-1955           MRN: [id number]             PCP: Duanne Butler DASEN, MD Referring: Duanne Butler DASEN, MD Visit Date: 02/08/2023 Occupation: @GUAROCC @  Subjective:  Medication monitoring   History of Present Illness: Shelitha Magley is a 68 y.o. female with history of psoriatic arthritis and episcleritis. Patient remains on methotrexate  6 tablets p.o. weekly and folic acid  1mg  po every day.  She is currently on taltz  80 mg sq injections every 28 days--started on 12/13/22.  She is tolerating combination therapy without any side effects or injection site reactions.  Patient reports that she has noticed an improvement in her symptoms on combination therapy.  She states that she has had some issue getting Taltz  delivered on time but now has several doses on hand.  She denies any Achilles tendinitis or plantar fasciitis.  She denies any SI joint pain.  She denies any episcleritis flares.  She denies any active psoriasis at this time.  She denies any new medical conditions.  She has not had any recent or recurrent infections.  Activities of Daily Living:  Patient reports morning stiffness for a few minutes.   Patient Reports nocturnal pain.  Difficulty dressing/grooming: Denies Difficulty climbing stairs: Denies Difficulty getting out of chair: Denies Difficulty using hands for taps, buttons, cutlery, and/or writing: Reports  Review of Systems  Constitutional:  Negative for fatigue.  HENT:  Negative for mouth sores, mouth dryness and nose dryness.   Eyes:  Positive for dryness. Negative for pain and itching.  Respiratory:  Negative for shortness of breath and difficulty breathing.   Cardiovascular:  Negative for chest pain and palpitations.  Gastrointestinal:  Negative for blood in stool, constipation and diarrhea.  Endocrine: Negative for increased urination.  Genitourinary:  Negative for involuntary  urination.  Musculoskeletal:  Positive for joint pain, joint pain, muscle weakness and morning stiffness. Negative for gait problem, joint swelling, myalgias, muscle tenderness and myalgias.  Skin:  Negative for color change, rash, hair loss and sensitivity to sunlight.  Allergic/Immunologic: Negative for susceptible to infections.  Neurological:  Positive for numbness. Negative for dizziness and headaches.  Hematological:  Negative for swollen glands.  Psychiatric/Behavioral:  Negative for depressed mood and sleep disturbance. The patient is not nervous/anxious.     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   Antineoplastic chemotherapy induced anemia 08/27/2015   Breast cancer of lower-inner quadrant of left female breast (HCC) 07/03/2015   Left carotid artery stenosis    Psoriasis    Hormone replacement therapy    Smoker    Episcleritis     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 ,grade 1   COPD (chronic obstructive pulmonary disease) (HCC)  Episcleritis    Hormone replacement therapy    Hyperlipidemia    Hypertension    Left carotid artery stenosis    40-59% (01/2015)   Personal history of chemotherapy 2017   neoadjuvant   Personal history of radiation therapy 2018   Psoriasis    SIADH (syndrome of inappropriate ADH production) (HCC)    Smoker     Family History  Problem Relation Age of Onset   Stroke Mother    Colon  polyps Sister    Colon cancer Brother    Esophageal cancer Neg Hx    Rectal cancer Neg Hx    Stomach cancer Neg Hx    Past Surgical History:  Procedure Laterality Date   ABDOMINAL HYSTERECTOMY     BREAST BIOPSY Left 06/2015   BREAST LUMPECTOMY Left 01/2016   BREAST LUMPECTOMY WITH RADIOACTIVE SEED AND SENTINEL LYMPH NODE BIOPSY Left 01/08/2016   Procedure: LEFT BREAST LUMPECTOMY WITH RADIOACTIVE SEED AND SENTINEL LYMPH NODE BIOPSY, BLUE DYE INJECTION;  Surgeon: Donnice Bury, MD;  Location: [facility];  Service: General;  Laterality: Left;   cervical acdf     C5-6- fusion with plates/screws Dr. Alix '12   COLONOSCOPY WITH PROPOFOL  N/A 04/16/2014   Procedure: COLONOSCOPY WITH PROPOFOL ;  Surgeon: Gladis MARLA Louder, MD;  Location: [facility];  Service: Endoscopy;  Laterality: N/A;   COLPOSCOPY     laporotomy     Ovarian cyst   PORT-A-CATH REMOVAL Right 01/08/2016   Procedure: REMOVAL PORT-A-CATH;  Surgeon: Donnice Bury, MD;  Location: [facility];  Service: General;  Laterality: Right;   PORTACATH PLACEMENT N/A 07/24/2015   Procedure: INSERTION PORT-A-CATH WITH [place] ;  Surgeon: Donnice Bury, MD;  Location: [facility] OR;  Service: General;  Laterality: N/A;   ROBOTIC ASSISTED SALPINGO OOPHERECTOMY Bilateral 10/10/2018   Procedure: XI ROBOTIC ASSISTED SALPINGO OOPHORECTOMY;  Surgeon: Eloy Herring, MD;  Location: [facility];  Service: Gynecology;  Laterality: Bilateral;   TONSILLECTOMY     child   Social History   Social History Narrative   Married x 25 years.   Family lives nearby.   Immunization History  Administered Date(s) Administered   Influenza Split 11/04/2012   Influenza, Seasonal, Injecte, Preservative Fre 09/16/2022   Influenza,inj,Quad PF,6+ Mos 10/25/2016, 09/20/2018   Influenza-Unspecified 11/04/2013   MODERNA COVID-19 SARS-COV-2 PEDS BIVALENT BOOSTER 51yr-50yr 03/27/2019, 04/24/2019   Pneumococcal Polysaccharide-23 10/25/2016   Tdap  07/04/2009     Objective: Vital Signs: BP 131/85 (BP Location: Right Arm, Patient Position: Sitting, Cuff Size: Normal)   Pulse 76   Resp 15   Ht 5' 4 (1.626 m)   Wt 116 lb 3.2 oz (52.7 kg)   BMI 19.95 kg/m    Physical Exam Vitals and nursing note reviewed.  Constitutional:      Appearance: She is well-developed.  HENT:     Head: Normocephalic and atraumatic.  Eyes:     Conjunctiva/sclera: Conjunctivae normal.  Cardiovascular:     Rate and Rhythm: Normal rate and regular rhythm.     Heart sounds: Normal heart sounds.  Pulmonary:     Effort: Pulmonary effort is normal.     Breath sounds: Normal breath sounds.  Abdominal:     General: Bowel sounds are normal.     Palpations: Abdomen is soft.  Musculoskeletal:     Cervical back: Normal range of motion.  Lymphadenopathy:     Cervical: No cervical adenopathy.  Skin:    General: Skin is warm and dry.  Capillary Refill: Capillary refill takes less than 2 seconds.  Neurological:     Mental Status: She is alert and oriented to person, place, and time.  Psychiatric:        Behavior: Behavior normal.      Musculoskeletal Exam: C-spine has limited range of motion with lateral rotation.  Some discomfort with range of motion of the lumbar spine.  Shoulder joints and elbow joints have good range of motion.  Wrist joints, MCPs, PIPs, DIPs have good range of motion with no synovitis.  Complete fist formation bilaterally.  Hip joints have good range of motion with no groin pain.  Knee joints have good range of motion no warmth or effusion.  No evidence of Achilles tendinitis.  CDAI Exam: CDAI Score: -- Patient Global: --; Provider Global: -- Swollen: --; Tender: -- Joint Exam 02/08/2023   No joint exam has been documented for this visit   There is currently no information documented on the homunculus. Go to the Rheumatology activity and complete the homunculus joint exam.  Investigation: No additional  findings.  Imaging: No results found.  Recent Labs: Lab Results  Component Value Date   WBC 8.7 11/16/2022   HGB 12.2 11/16/2022   PLT 320 11/16/2022   NA 136 12/13/2022   K 4.7 12/13/2022   CL 99 12/13/2022   CO2 25 12/13/2022   GLUCOSE 77 12/13/2022   BUN 13 12/13/2022   CREATININE 0.67 12/13/2022   BILITOT 0.2 12/13/2022   ALKPHOS 77 03/12/2019   AST 19 12/13/2022   ALT 12 12/13/2022   PROT 6.8 12/13/2022   ALBUMIN 4.2 03/12/2019   CALCIUM  9.8 12/13/2022   GFRAA 100 06/17/2020   QFTBGOLDPLUS NEGATIVE 10/19/2022    Speciality Comments: Tremfya stopped 11/15/2022, Taltz  started 12/13/2022  Procedures:  No procedures performed Allergies: Patient has no known allergies.   Assessment / Plan:     Visit Diagnoses: Psoriatic arthropathy (HCC): No synovitis or dactylitis was noted on examination today.  She has not had any signs or symptoms of a psoriatic arthritis flare.  She has no active psoriasis at this time.  No evidence of Achilles tendinitis or plantar fasciitis.  No SI joint tenderness upon palpation.  Patient is currently on Taltz  and methotrexate  as combination therapy.  The loading dose of taltz  was initiated on 12/13/22.  She has been tolerating Taltz  without any side effects or injection site reactions.  Patient will remain on Taltz  and methotrexate  as combination therapy.  She was advised to notify us  if she develops any new or worsening symptoms.  She will follow-up in the office in 3 months or sooner if needed.  Psoriasis - No active psoriasis at this time.  Patient will remain on Taltz  and methotrexate  as combination therapy.  High risk medication use - Taltz  80 mg sq injections-performing loading dose--started 12/13/22.  Patient remains on methotrexate  6 tablets by mouth once weekly and folic acid  1 mg daily. CMP updated on 12/13/22.  CBC updated on 11/16/22.  Orders for CBC and CMP released today.  Her next lab work will be due in May and every 3 months.  TB gold  negative on 10/19/22 No recent or recurrent infections.  Discussed the importance of holding methotrexate  if she develops signs or symptoms of an infection and to resume once the infection has completely cleared.  - Plan: COMPLETE METABOLIC PANEL WITH GFR, CBC with Differential/Platelet  Rheumatoid factor positive - Rheumatoid factor 24, anti-CCP negative, normal, CRP negative.  Episcleritis of left  eye: She has not had any signs or symptoms of an episcleritis flare.  Swelling of right wrist: Resolved.  No active inflammation at this time.  Pain in both hands - X-rays were consistent with osteoarthritis.  Trochanteric bursitis of both hips: Not currently symptomatic.  Good range of motion of both hip joints with no groin pain.  DDD (degenerative disc disease), cervical - Status post fusion by Dr. Nudelman.  Spondylosis of lumbar spine - MRI January 12, 2021 showed mild degeneration with foraminal stenosis.  Epidural injection by Dr. Eldonna in April 2023 relieved pain.  Patient continues to experience intermittent discomfort in her lower back.  She has muscle spasms at times.  Other medical conditions are listed as follows:  Malignant neoplasm of lower-inner quadrant of left breast in female, estrogen receptor positive [facility]) - dxd June 2017.  Chemotherapy-induced peripheral neuropathy (HCC)  Antineoplastic chemotherapy induced anemia  Hormone replacement therapy  SIADH (syndrome of inappropriate ADH production) (HCC)  Left carotid artery stenosis  Smoker  Orders: Orders Placed This Encounter  Procedures   COMPLETE METABOLIC PANEL WITH GFR   CBC with Differential/Platelet   No orders of the defined types were placed in this encounter.  Follow-Up Instructions: Return in about 3 months (around 05/08/2023) for Psoriatic arthritis.   Waddell CHRISTELLA Craze, PA-C  Note - This record has been created using Dragon software.  Chart creation errors have been sought, but may not always  have  been located. Such creation errors do not reflect on  the standard of medical care.

## 2023-02-07 ENCOUNTER — Other Ambulatory Visit: Payer: Self-pay | Admitting: Family Medicine

## 2023-02-07 MED ORDER — HYDROCODONE-ACETAMINOPHEN 5-325 MG PO TABS: 1.0000 | ORAL_TABLET | Freq: Three times a day (TID) | ORAL | 0 refills | Status: DC | PRN

## 2023-02-08 ENCOUNTER — Encounter: Payer: Self-pay | Admitting: Physician Assistant

## 2023-02-08 ENCOUNTER — Ambulatory Visit: Payer: Medicare PPO | Attending: Physician Assistant | Admitting: Physician Assistant

## 2023-02-08 VITALS — BP 131/85 | HR 76 | Resp 15 | Ht 64.0 in | Wt 116.2 lb

## 2023-02-08 DIAGNOSIS — I6522 Occlusion and stenosis of left carotid artery: Secondary | ICD-10-CM

## 2023-02-08 DIAGNOSIS — E222 Syndrome of inappropriate secretion of antidiuretic hormone: Secondary | ICD-10-CM

## 2023-02-08 DIAGNOSIS — Z79899 Other long term (current) drug therapy: Secondary | ICD-10-CM | POA: Diagnosis not present

## 2023-02-08 DIAGNOSIS — M25431 Effusion, right wrist: Secondary | ICD-10-CM

## 2023-02-08 DIAGNOSIS — M79641 Pain in right hand: Secondary | ICD-10-CM | POA: Diagnosis not present

## 2023-02-08 DIAGNOSIS — M7062 Trochanteric bursitis, left hip: Secondary | ICD-10-CM

## 2023-02-08 DIAGNOSIS — T451X5A Adverse effect of antineoplastic and immunosuppressive drugs, initial encounter: Secondary | ICD-10-CM

## 2023-02-08 DIAGNOSIS — M7061 Trochanteric bursitis, right hip: Secondary | ICD-10-CM | POA: Diagnosis not present

## 2023-02-08 DIAGNOSIS — M5030 Other cervical disc degeneration, unspecified cervical region: Secondary | ICD-10-CM

## 2023-02-08 DIAGNOSIS — H15102 Unspecified episcleritis, left eye: Secondary | ICD-10-CM

## 2023-02-08 DIAGNOSIS — C50312 Malignant neoplasm of lower-inner quadrant of left female breast: Secondary | ICD-10-CM

## 2023-02-08 DIAGNOSIS — D6481 Anemia due to antineoplastic chemotherapy: Secondary | ICD-10-CM

## 2023-02-08 DIAGNOSIS — L4050 Arthropathic psoriasis, unspecified: Secondary | ICD-10-CM

## 2023-02-08 DIAGNOSIS — L409 Psoriasis, unspecified: Secondary | ICD-10-CM | POA: Diagnosis not present

## 2023-02-08 DIAGNOSIS — M47816 Spondylosis without myelopathy or radiculopathy, lumbar region: Secondary | ICD-10-CM

## 2023-02-08 DIAGNOSIS — R768 Other specified abnormal immunological findings in serum: Secondary | ICD-10-CM | POA: Diagnosis not present

## 2023-02-08 DIAGNOSIS — G620 Drug-induced polyneuropathy: Secondary | ICD-10-CM

## 2023-02-08 DIAGNOSIS — Z79890 Hormone replacement therapy (postmenopausal): Secondary | ICD-10-CM

## 2023-02-08 DIAGNOSIS — M79642 Pain in left hand: Secondary | ICD-10-CM

## 2023-02-08 DIAGNOSIS — F17200 Nicotine dependence, unspecified, uncomplicated: Secondary | ICD-10-CM

## 2023-02-08 DIAGNOSIS — Z170 Estrogen receptor positive status [ER+]: Secondary | ICD-10-CM

## 2023-02-08 NOTE — Patient Instructions (Signed)
 Standing Labs We placed an order today for your standing lab work.   Please have your standing labs drawn in [place] and every 3 months   Please have your labs drawn 2 weeks prior to your appointment so that the provider can discuss your lab results at your appointment, if possible.  Please note that you [place] see your imaging and lab results in MyChart before we have reviewed them. We will contact you once all results are reviewed. Please allow our office up to 72 hours to thoroughly review all of the results before contacting the office for clarification of your results.  WALK-IN LAB HOURS  Monday through Thursday from 8:00 am -12:30 pm and 1:00 pm-5:00 pm and Friday from 8:00 am-12:00 pm.  Patients with office visits requiring labs will be seen before walk-in labs.  You [place] encounter longer than normal wait times. Please allow additional time. Wait times [place] be shorter on  Monday and Thursday afternoons.  We do not book appointments for walk-in labs. We appreciate your patience and understanding with our staff.   Labs are drawn by [facility]. Please bring your co-pay at the time of your lab draw.  You [place] receive a bill from [facility] for your lab work.  Please note if you are on Hydroxychloroquine and and an order has been placed for a Hydroxychloroquine level,  you will need to have it drawn 4 hours or more after your last dose.  If you wish to have your labs drawn at another location, please call the office 24 hours in advance so we can fax the orders.  The office is located at [address], [address], [place], [place] [address]   If you have any questions regarding directions or hours of operation,  please call [phone].   As a reminder, please drink plenty of water prior to coming for your lab work. Thanks!

## 2023-02-09 LAB — CBC WITH DIFFERENTIAL/PLATELET
Absolute Lymphocytes: 1529 {cells}/uL (ref 850–3900)
Absolute Monocytes: 803 {cells}/uL (ref 200–950)
Basophils Absolute: 62 {cells}/uL (ref 0–200)
Basophils Relative: 0.8 %
Eosinophils Absolute: 117 {cells}/uL (ref 15–500)
Eosinophils Relative: 1.5 %
HCT: 36.8 % (ref 35.0–45.0)
Hemoglobin: 12.2 g/dL (ref 11.7–15.5)
MCH: 35.7 pg — ABNORMAL HIGH (ref 27.0–33.0)
MCHC: 33.2 g/dL (ref 32.0–36.0)
MCV: 107.6 fL — ABNORMAL HIGH (ref 80.0–100.0)
MPV: 9.4 fL (ref 7.5–12.5)
Monocytes Relative: 10.3 %
Neutro Abs: 5288 {cells}/uL (ref 1500–7800)
Neutrophils Relative %: 67.8 %
Platelets: 338 10*3/uL (ref 140–400)
RBC: 3.42 10*6/uL — ABNORMAL LOW (ref 3.80–5.10)
RDW: 13.1 % (ref 11.0–15.0)
Total Lymphocyte: 19.6 %
WBC: 7.8 10*3/uL (ref 3.8–10.8)

## 2023-02-09 LAB — COMPLETE METABOLIC PANEL WITH GFR
AG Ratio: 1.7 (calc) (ref 1.0–2.5)
ALT: 25 U/L (ref 6–29)
AST: 38 U/L — ABNORMAL HIGH (ref 10–35)
Albumin: 4.5 g/dL (ref 3.6–5.1)
Alkaline phosphatase (APISO): 93 U/L (ref 37–153)
BUN: 18 mg/dL (ref 7–25)
CO2: 28 mmol/L (ref 20–32)
Calcium: 10.1 mg/dL (ref 8.6–10.4)
Chloride: 99 mmol/L (ref 98–110)
Creat: 0.76 mg/dL (ref 0.50–1.05)
Globulin: 2.6 g/dL (ref 1.9–3.7)
Glucose, Bld: 90 mg/dL (ref 65–99)
Potassium: 4.9 mmol/L (ref 3.5–5.3)
Sodium: 137 mmol/L (ref 135–146)
Total Bilirubin: 0.4 mg/dL (ref 0.2–1.2)
Total Protein: 7.1 g/dL (ref 6.1–8.1)
eGFR: 86 mL/min/{1.73_m2} (ref >=60)

## 2023-02-09 LAB — B12 AND FOLATE PANEL
Folate: >24 ng/mL
Vitamin B-12: 383 pg/mL (ref 200–1100)

## 2023-02-09 LAB — TEST AUTHORIZATION

## 2023-02-09 NOTE — Progress Notes (Signed)
 RBC count is low.  MCV and MCH are elevated--please clarify if she has been taking folic acid ? Can we add on vitamin B12 and folate?  AST is elevated-38.  ALT WNL.  Avoid the use of tylenol  and alcohol use.  Rest of CMP WNL

## 2023-02-09 NOTE — Progress Notes (Signed)
Vitamin B12 and folate WNL.

## 2023-02-14 ENCOUNTER — Other Ambulatory Visit: Payer: Self-pay | Admitting: Rheumatology

## 2023-02-14 NOTE — Telephone Encounter (Signed)
 Last Fill: 11/17/2022  Labs: 02/08/2023 RBC count is low.  MCV and MCH are elevated AST is elevated-38.  ALT WNL.   Vitamin B12 and folate WNL   Next Visit: 05/18/2023  Last Visit: 02/08/2023  DX: Psoriatic arthropathy   Current Dose per office note 02/08/2023: methotrexate  6 tablets by mouth once weekly   Okay to refill Methotrexate ?

## 2023-02-16 ENCOUNTER — Telehealth: Payer: Self-pay | Admitting: *Deleted

## 2023-02-16 NOTE — Progress Notes (Unsigned)
 Office Visit Note  Patient: Kim Ortega             Date of Birth: Jun 24, 1955           MRN: [id number]             PCP: [name], MD Referring: [name], MD Visit Date: 02/17/2023 Occupation: @GUAROCC @  Subjective:  Pain in multiple joints  History of Present Illness: Kim Ortega is a 68 y.o. female with psoriatic arthritis, psoriasis and episcleritis.  She returns today after her last visit on February 08, 2023.  She states she is still finishing the loading dose of Taltz.  She is on methotrexate 6 tablets weekly with folic acid 2 mg daily.  She had no interruption in the treatment.  She states 5 days ago she developed pain and swelling on her left wrist.  After that she noticed bruising on her left forearm.  She does not recall having an injury.  She states the symptoms improved gradually.  Although she has experienced pain and discomfort in her joints which is moved around.  She has experienced pain in her left shoulder, right knee and left hip.  She has been also having some discomfort in her right ankle.  She has not had any episodes of plantar fasciitis Achilles tendinitis.  No episcleritis flares were noted.    Activities of Daily Living:  Patient reports morning stiffness for all day. Patient Reports nocturnal pain.  Difficulty dressing/grooming: Reports Difficulty climbing stairs: Reports Difficulty getting out of chair: Reports Difficulty using hands for taps, buttons, cutlery, and/or writing: Reports  Review of Systems  Constitutional:  Positive for fatigue.  HENT:  Negative for mouth sores and mouth dryness.   Eyes:  Negative for dryness.  Respiratory:  Negative for shortness of breath.   Cardiovascular:  Negative for chest pain and palpitations.  Gastrointestinal:  Negative for blood in stool, constipation and diarrhea.  Endocrine: Negative for increased urination.  Genitourinary:  Negative for involuntary urination.   Musculoskeletal:  Positive for joint pain, gait problem, joint pain, joint swelling and morning stiffness. Negative for myalgias, muscle weakness, muscle tenderness and myalgias.  Skin:  Negative for color change, rash, hair loss and sensitivity to sunlight.  Allergic/Immunologic: Negative for susceptible to infections.  Neurological:  Positive for headaches. Negative for dizziness.  Hematological:  Positive for bruising/bleeding tendency. Negative for swollen glands.  Psychiatric/Behavioral:  Positive for sleep disturbance. Negative for depressed mood. The patient is not nervous/anxious.     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   Antineoplastic chemotherapy induced anemia 08/27/2015   Breast cancer of lower-inner quadrant of left female breast (HCC) 07/03/2015   Left carotid artery stenosis    Psoriasis    Hormone replacement therapy    Smoker    Episcleritis     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 ,grade 1  COPD (chronic obstructive pulmonary disease) (HCC)    Episcleritis    Hormone replacement therapy    Hyperlipidemia    Hypertension    Left carotid artery stenosis    40-59% (01/2015)   Personal history of chemotherapy 2017   neoadjuvant   Personal history of radiation therapy 2018   Psoriasis    SIADH (syndrome of inappropriate ADH production) (HCC)    Smoker     Family History  Problem Relation Age of Onset    Stroke Mother    Colon polyps Sister    Colon cancer Brother    Esophageal cancer Neg Hx    Rectal cancer Neg Hx    Stomach cancer Neg Hx    Past Surgical History:  Procedure Laterality Date   ABDOMINAL HYSTERECTOMY     BREAST BIOPSY Left 06/2015   BREAST LUMPECTOMY Left 01/2016   BREAST LUMPECTOMY WITH RADIOACTIVE SEED AND SENTINEL LYMPH NODE BIOPSY Left 01/08/2016   Procedure: LEFT BREAST LUMPECTOMY WITH RADIOACTIVE SEED AND SENTINEL LYMPH NODE BIOPSY, BLUE DYE INJECTION;  Surgeon: Emelia Loron, MD;  Location: [facility];  Service: General;  Laterality: Left;   cervical acdf     C5-6- fusion with plates/screws "Dr. Newell Coral" '12   COLONOSCOPY WITH PROPOFOL N/A 04/16/2014   Procedure: COLONOSCOPY WITH PROPOFOL;  Surgeon: Charolett Bumpers, MD;  Location: [facility];  Service: Endoscopy;  Laterality: N/A;   COLPOSCOPY     laporotomy     Ovarian cyst   PORT-A-CATH REMOVAL Right 01/08/2016   Procedure: REMOVAL PORT-A-CATH;  Surgeon: Emelia Loron, MD;  Location: [facility];  Service: General;  Laterality: Right;   PORTACATH PLACEMENT N/A 07/24/2015   Procedure: INSERTION PORT-A-CATH WITH [place];  Surgeon: Emelia Loron, MD;  Location: [facility] OR;  Service: General;  Laterality: N/A;   ROBOTIC ASSISTED SALPINGO OOPHERECTOMY Bilateral 10/10/2018   Procedure: XI ROBOTIC ASSISTED SALPINGO OOPHORECTOMY;  Surgeon: Adolphus Birchwood, MD;  Location: [facility];  Service: Gynecology;  Laterality: Bilateral;   TONSILLECTOMY     child   Social History   Social History Narrative   Married x 25 years.   Family lives nearby.   Immunization History  Administered Date(s) Administered   Influenza Split 11/04/2012   Influenza, Seasonal, Injecte, Preservative Fre 09/16/2022   Influenza,inj,Quad PF,6+ Mos 10/25/2016, 09/20/2018   Influenza-Unspecified 11/04/2013   MODERNA COVID-19 SARS-COV-2 PEDS BIVALENT BOOSTER 27yr-22yr 03/27/2019, 04/24/2019   Pneumococcal Polysaccharide-23  10/25/2016   Tdap 07/04/2009     Objective: Vital Signs: BP 126/86 (BP Location: Right Arm, Patient Position: Sitting, Cuff Size: Normal)   Pulse 90   Resp 15   Ht 5\' 4"  (1.626 m)   Wt 117 lb (53.1 kg)   BMI 20.08 kg/m    Physical Exam Vitals and nursing note reviewed.  Constitutional:      Appearance: She is well-developed.  HENT:     Head: Normocephalic and atraumatic.  Eyes:     Conjunctiva/sclera: Conjunctivae normal.  Cardiovascular:     Rate and Rhythm: Normal rate and regular rhythm.     Heart sounds: Normal heart sounds.  Pulmonary:     Effort: Pulmonary effort is normal.     Breath sounds: Normal breath sounds.  Abdominal:     General: Bowel sounds are normal.     Palpations: Abdomen is soft.  Musculoskeletal:     Cervical back: Normal range of motion.  Lymphadenopathy:     Cervical: No cervical adenopathy.  Skin:    General: Skin  is warm and dry.     Capillary Refill: Capillary refill takes less than 2 seconds.  Neurological:     Mental Status: She is alert and oriented to person, place, and time.  Psychiatric:        Behavior: Behavior normal.      Musculoskeletal Exam: Patient has stiffness with range of motion of the cervical spine.  Right shoulder was in good range of motion.  Left shoulder joint abduction and internal rotation was full but painful.  Elbow joints in good range of motion.  She had mild synovitis in her left wrist.  She had painful range of motion of her left hip joint and right knee joint.  No warmth or swelling was noted.  She had tenderness over left ankle joint.  CDAI Exam: CDAI Score: -- Patient Global: --; Provider Global: -- Swollen: 1 ; Tender: 5  Joint Exam 02/17/2023      Right  Left  Glenohumeral      Tender  Wrist     Swollen Tender  Hip      Tender  Knee   Tender     Ankle   Tender        Investigation: No additional findings.  Imaging: No results found.  Recent Labs: Lab Results  Component Value Date    WBC 7.8 02/08/2023   HGB 12.2 02/08/2023   PLT 338 02/08/2023   NA 137 02/08/2023   K 4.9 02/08/2023   CL 99 02/08/2023   CO2 28 02/08/2023   GLUCOSE 90 02/08/2023   BUN 18 02/08/2023   CREATININE 0.76 02/08/2023   BILITOT 0.4 02/08/2023   ALKPHOS 77 03/12/2019   AST 38 (H) 02/08/2023   ALT 25 02/08/2023   PROT 7.1 02/08/2023   ALBUMIN 4.2 03/12/2019   CALCIUM 10.1 02/08/2023   GFRAA 100 06/17/2020   QFTBGOLDPLUS NEGATIVE 10/19/2022    Speciality Comments: Tremfya stopped 11/15/2022, Taltz started 12/13/2022  Procedures:  No procedures performed Allergies: Patient has no known allergies.   Assessment / Plan:     Visit Diagnoses: Psoriatic arthropathy (HCC)-patient has been experiencing increased pain and swelling in her joints.  She states she was doing well on the combination of methotrexate and Taltz until 5 days ago when she developed pain and swelling in her left wrist.  Since then she has had migratory arthritis involving multiple joints.  Today she has pain and discomfort in her left shoulder.  She states yesterday she could not even lift her left arm but today she can lift her left arm.  She has tenderness over her right wrist, right knee and right ankle.  She also had painful range of motion of her left hip joint.  She started [place] in December.  She is also on methotrexate.  Patient requested a Depo-Medrol intramuscular injection.  Patient states she was given that by Dr. [name] in the past and had very good relief from it.  She would prefer Depo-Medrol intramuscular injection of her prednisone taper.  Per patient's request I injected her with Depo-Medrol 80 mg intramuscular in the right gluteal region.  Side effects of steroids including the risk of weight gain, hypertension, hyperglycemia, osteoporosis, cataracts and increased risk of heart disease were discussed.  Administrations This Visit     methylPREDNISolone acetate (DEPO-MEDROL) injection 40 mg     Admin  Date 02/17/2023 Action Given Dose 80 mg Route Intramuscular Documented By Ellen Henri, CMA  Psoriasis-no psoriasis lesions were noted on the examination today.  High risk medication use - Taltz 80 mg sq injections-performing loading dose--started 12/13/22.  Patient remains on methotrexate 6 tablets by mouth once weekly and folic acid 1 mg daily.  February 08, 2023 CBC and CMP were normal except AST was mildly elevated at 38.  She was advised to get labs every 3 months.  Information on immunization was placed in the AVS.  She was advised to hold Taltz and methotrexate if she develops an infection resume after the infection resolves.  Rheumatoid factor positive - Rheumatoid factor 24, anti-CCP negative, normal, CRP negative.  Episcleritis of left eye-no recent episodes of flare.  Swelling of left wrist-she had swelling over the left wrist today.  She also had some tenderness over right wrist.  Pain in both hands-no dactylitis was noted.  Trochanteric bursitis of both hips-she complains of some trochanteric bursitis.  She also had painful range of motion of her left hip.  DDD (degenerative disc disease), cervical - Status post fusion by Dr. Newell Coral.  S/P cervical spinal fusion-she did experience increasing stiffness in her cervical spine.  Spondylosis of lumbar spine - MRI January 12, 2021 showed mild degeneration with foraminal stenosis.  Epidural injection by Dr. Alvester Morin in April 2023 relieved pain.  Malignant neoplasm of lower-inner quadrant of left breast in female, estrogen receptor positive [facility]) - dxd June 2017.  Antineoplastic chemotherapy induced anemia  Chemotherapy-induced peripheral neuropathy (HCC)  Left carotid artery stenosis  Hormone replacement therapy  SIADH (syndrome of inappropriate ADH production) (HCC)  Orders: No orders of the defined types were placed in this encounter.  Meds ordered this encounter  Medications   DISCONTD:  methylPREDNISolone acetate (DEPO-MEDROL) injection 80 mg   methylPREDNISolone acetate (DEPO-MEDROL) injection 40 mg     Follow-Up Instructions: Return for Psoriatic arthritis.   Pollyann Savoy, MD  Note - This record has been created using Animal nutritionist.  Chart creation errors have been sought, but may not always  have been located. Such creation errors do not reflect on  the standard of medical care.

## 2023-02-16 NOTE — Telephone Encounter (Signed)
Patient scheduled an appointment for 02/17/2023 at 8:00 am for evaluation.

## 2023-02-16 NOTE — Telephone Encounter (Signed)
Patient contacted the office and states she was recently in the office for an appointment and has been doing well. Patient states she is on MTX and Taltz. Patient states since that appointment she is now having trouble with bilateral wrist, left shoulder and right knee. Patient states she ha been unable to use her left hand at times. Patient states her shoulder is very painful to move. Patient is wanting to know if it would be helpful and if she could come in for a Dep Medrol injection until the Altamease Oiler gets in her system. Patient states she is due for her next loading dose of Taltz on 02/21/2023. Please advise.

## 2023-02-16 NOTE — Telephone Encounter (Signed)
Ok to schedule visit to determine if she is a good candidate for depo medrol injection vs. Prednisone taper

## 2023-02-17 ENCOUNTER — Ambulatory Visit: Payer: Medicare PPO | Attending: Rheumatology | Admitting: Rheumatology

## 2023-02-17 ENCOUNTER — Encounter: Payer: Self-pay | Admitting: Rheumatology

## 2023-02-17 ENCOUNTER — Other Ambulatory Visit: Payer: Self-pay | Admitting: Family Medicine

## 2023-02-17 VITALS — BP 126/86 | HR 90 | Resp 15 | Ht 64.0 in | Wt 117.0 lb

## 2023-02-17 DIAGNOSIS — M25431 Effusion, right wrist: Secondary | ICD-10-CM

## 2023-02-17 DIAGNOSIS — E222 Syndrome of inappropriate secretion of antidiuretic hormone: Secondary | ICD-10-CM

## 2023-02-17 DIAGNOSIS — Z79890 Hormone replacement therapy (postmenopausal): Secondary | ICD-10-CM

## 2023-02-17 DIAGNOSIS — C50312 Malignant neoplasm of lower-inner quadrant of left female breast: Secondary | ICD-10-CM

## 2023-02-17 DIAGNOSIS — M79641 Pain in right hand: Secondary | ICD-10-CM | POA: Diagnosis not present

## 2023-02-17 DIAGNOSIS — D6481 Anemia due to antineoplastic chemotherapy: Secondary | ICD-10-CM

## 2023-02-17 DIAGNOSIS — L4050 Arthropathic psoriasis, unspecified: Secondary | ICD-10-CM

## 2023-02-17 DIAGNOSIS — T451X5A Adverse effect of antineoplastic and immunosuppressive drugs, initial encounter: Secondary | ICD-10-CM

## 2023-02-17 DIAGNOSIS — M25432 Effusion, left wrist: Secondary | ICD-10-CM | POA: Diagnosis not present

## 2023-02-17 DIAGNOSIS — L409 Psoriasis, unspecified: Secondary | ICD-10-CM | POA: Diagnosis not present

## 2023-02-17 DIAGNOSIS — G620 Drug-induced polyneuropathy: Secondary | ICD-10-CM

## 2023-02-17 DIAGNOSIS — R768 Other specified abnormal immunological findings in serum: Secondary | ICD-10-CM

## 2023-02-17 DIAGNOSIS — Z170 Estrogen receptor positive status [ER+]: Secondary | ICD-10-CM

## 2023-02-17 DIAGNOSIS — H15102 Unspecified episcleritis, left eye: Secondary | ICD-10-CM | POA: Diagnosis not present

## 2023-02-17 DIAGNOSIS — M7061 Trochanteric bursitis, right hip: Secondary | ICD-10-CM

## 2023-02-17 DIAGNOSIS — M5030 Other cervical disc degeneration, unspecified cervical region: Secondary | ICD-10-CM

## 2023-02-17 DIAGNOSIS — M7062 Trochanteric bursitis, left hip: Secondary | ICD-10-CM

## 2023-02-17 DIAGNOSIS — R7689 Other specified abnormal immunological findings in serum: Secondary | ICD-10-CM

## 2023-02-17 DIAGNOSIS — M79642 Pain in left hand: Secondary | ICD-10-CM

## 2023-02-17 DIAGNOSIS — Z79899 Other long term (current) drug therapy: Secondary | ICD-10-CM | POA: Diagnosis not present

## 2023-02-17 DIAGNOSIS — I6522 Occlusion and stenosis of left carotid artery: Secondary | ICD-10-CM

## 2023-02-17 DIAGNOSIS — Z981 Arthrodesis status: Secondary | ICD-10-CM

## 2023-02-17 DIAGNOSIS — M47816 Spondylosis without myelopathy or radiculopathy, lumbar region: Secondary | ICD-10-CM

## 2023-02-17 MED ORDER — METHYLPREDNISOLONE ACETATE 40 MG/ML IJ SUSP
40.0000 mg | Freq: Once | INTRAMUSCULAR | Status: AC
  Administered 2023-02-17: 80 mg via INTRAMUSCULAR

## 2023-02-17 MED ORDER — METHYLPREDNISOLONE ACETATE 80 MG/ML IJ SUSP: 80.0000 mg | Freq: Once | INTRAMUSCULAR | Status: DC

## 2023-02-17 NOTE — Patient Instructions (Signed)
Standing Labs We placed an order today for your standing lab work.   Please have your standing labs drawn in  May and every 3 months  Please have your labs drawn 2 weeks prior to your appointment so that the provider can discuss your lab results at your appointment, if possible.  Please note that you may see your imaging and lab results in MyChart before we have reviewed them. We will contact you once all results are reviewed. Please allow our office up to 72 hours to thoroughly review all of the results before contacting the office for clarification of your results.  WALK-IN LAB HOURS  Monday through Thursday from 8:00 am -12:30 pm and 1:00 pm-5:00 pm and Friday from 8:00 am-12:00 pm.  Patients with office visits requiring labs will be seen before walk-in labs.  You may encounter longer than normal wait times. Please allow additional time. Wait times may be shorter on  Monday and Thursday afternoons.  We do not book appointments for walk-in labs. We appreciate your patience and understanding with our staff.   Labs are drawn by [facility]. Please bring your co-pay at the time of your lab draw.  You may receive a bill from [facility] for your lab work.  Please note if you are on Hydroxychloroquine and and an order has been placed for a Hydroxychloroquine level,  you will need to have it drawn 4 hours or more after your last dose.  If you wish to have your labs drawn at another location, please call the office 24 hours in advance so we can fax the orders.  The office is located at [address], [address], [place], [place] [address]   If you have any questions regarding directions or hours of operation,  please call [phone].   As a reminder, please drink plenty of water prior to coming for your lab work. Thanks!   Vaccines You are taking a medication(s) that can suppress your immune system.  The following immunizations are recommended: Flu annually Covid-19  RSV Td/Tdap (tetanus,  diphtheria, pertussis) every 10 years Pneumonia (Prevnar 15 then Pneumovax 23 at least 1 year apart.  Alternatively, can take Prevnar 20 without needing additional dose) Shingrix: 2 doses from 4 weeks to 6 months apart  Please check with your PCP to make sure you are up to date.   If you have signs or symptoms of an infection or start antibiotics: First, call your PCP for workup of your infection. Hold your medication through the infection, until you complete your antibiotics, and until symptoms resolve if you take the following: Injectable medication (Actemra, Benlysta, Cimzia, Cosentyx, Enbrel, Humira, Kevzara, Orencia, Remicade, Simponi, Stelara, Taltz, Tremfya) Methotrexate Leflunomide (Arava) Mycophenolate (Cellcept) Harriette Ohara, Olumiant, or Rinvoq

## 2023-02-28 ENCOUNTER — Other Ambulatory Visit: Payer: Self-pay | Admitting: Family Medicine

## 2023-03-01 ENCOUNTER — Encounter: Payer: Self-pay | Admitting: Family Medicine

## 2023-03-05 ENCOUNTER — Other Ambulatory Visit: Payer: Self-pay | Admitting: Rheumatology

## 2023-03-05 ENCOUNTER — Other Ambulatory Visit: Payer: Self-pay | Admitting: Physician Assistant

## 2023-03-05 DIAGNOSIS — Z79899 Other long term (current) drug therapy: Secondary | ICD-10-CM

## 2023-03-05 DIAGNOSIS — L4050 Arthropathic psoriasis, unspecified: Secondary | ICD-10-CM

## 2023-03-05 DIAGNOSIS — L409 Psoriasis, unspecified: Secondary | ICD-10-CM

## 2023-03-07 ENCOUNTER — Telehealth: Payer: Self-pay | Admitting: Family Medicine

## 2023-03-07 ENCOUNTER — Other Ambulatory Visit: Payer: Self-pay

## 2023-03-07 MED ORDER — LOSARTAN POTASSIUM 50 MG PO TABS: ORAL_TABLET | ORAL | 0 refills | Status: DC

## 2023-03-07 NOTE — Telephone Encounter (Signed)
 Prescription Request  03/07/2023  LOV: 09/28/2022  What is the name of the medication or equipment?   losartan (COZAAR) 50 MG tablet   Have you contacted your pharmacy to request a refill? Yes   Which pharmacy would you like this sent to?  [id number] [place], [place] - [address] [place] [address] Phone: [phone] Fax: [phone]    Patient notified that their request is being sent to the clinical staff for review and that they should receive a response within 2 business days.   Please advise pharmacist.

## 2023-03-07 NOTE — Addendum Note (Signed)
 Addended by: Renee Pain on: 03/07/2023 11:50 AM   Modules accepted: Orders

## 2023-03-14 ENCOUNTER — Other Ambulatory Visit: Payer: Self-pay | Admitting: Family Medicine

## 2023-03-15 MED ORDER — HYDROCODONE-ACETAMINOPHEN 5-325 MG PO TABS: 1.0000 | ORAL_TABLET | Freq: Three times a day (TID) | ORAL | 0 refills | Status: DC | PRN

## 2023-03-17 ENCOUNTER — Other Ambulatory Visit: Payer: Self-pay | Admitting: Family Medicine

## 2023-03-18 ENCOUNTER — Other Ambulatory Visit: Payer: Self-pay | Admitting: Rheumatology

## 2023-03-18 NOTE — Telephone Encounter (Signed)
 Last Fill: 12/13/2022  Labs: 02/08/2023 RBC count is low.  MCV and MCH are elevated- AST is elevated-38.  ALT WNL Rest of CMP WNL   TB Gold: 10/19/2022 Neg    Next Visit: 05/18/2023  Last Visit: 02/17/2023  [id number] arthropathy   Current Dose per office note 02/17/2023: Taltz 80 mg sq injections   Okay to refill Taltz?

## 2023-03-18 NOTE — Telephone Encounter (Signed)
 Requested medications are due for refill today.  yes  Requested medications are on the active medications list.  yes  Last refill. 03/07/2023 #30 0 rf  Future visit scheduled.   yes  Notes to clinic.  Pt is requesting a 90 day supply.    Requested Prescriptions  Pending Prescriptions Disp Refills   losartan (COZAAR) 50 MG tablet [Pharmacy Med Name: LOSARTAN POTASSIUM 50 MG TAB] 180 tablet 1    Sig: TAKE 1 TABLET (50 MG) BY MOUTH IN THE MORNING AND AT BEDTIME     Cardiovascular:  Angiotensin Receptor Blockers Failed - 03/18/2023 10:13 AM      Failed - Valid encounter within last 6 months    Recent Outpatient Visits           2 years ago Acute viral conjunctivitis of left eye   [facility] [name], NP   2 years ago Pure hypercholesterolemia   [facility] [name], MD   3 years ago SIADH (syndrome of inappropriate ADH production) (HCC)   [facility] [name], MD   3 years ago Chronic diarrhea   [facility] [name], [name], MD   5 years ago Great toe pain, right   [facility] Pickard, [name], MD       Future Appointments             In 2 months Amada Jupiter Lenice Llamas [facility] Rheumatology - A Dept Of [place]. [facility]            Passed - Cr in normal range and within 180 days    Creatinine  Date Value Ref Range Status  05/20/2016 0.8 0.6 - 1.1 mg/dL Final   Creat  Date Value Ref Range Status  02/08/2023 0.76 0.50 - 1.05 mg/dL Final         Passed - K in normal range and within 180 days    Potassium  Date Value Ref Range Status  02/08/2023 4.9 3.5 - 5.3 mmol/L Final  05/20/2016 3.8 3.5 - 5.1 mEq/L Final         Passed - Patient is not pregnant      Passed - Last BP in normal range    BP Readings from Last 1 Encounters:  02/17/23 126/86

## 2023-03-20 ENCOUNTER — Other Ambulatory Visit: Payer: Self-pay | Admitting: Family Medicine

## 2023-03-22 NOTE — Telephone Encounter (Signed)
 Requested Prescriptions  Refused Prescriptions Disp Refills   amLODipine (NORVASC) 5 MG tablet [Pharmacy Med Name: AMLODIPINE BESYLATE 5 MG TAB] 90 tablet 3    Sig: TAKE 1 TABLET (5 MG TOTAL) BY MOUTH DAILY.     Cardiovascular: Calcium Channel Blockers 2 Failed - 03/22/2023  9:43 AM      Failed - Valid encounter within last 6 months    Recent Outpatient Visits           2 years ago Acute viral conjunctivitis of left eye   [facility] [name], NP   2 years ago Pure hypercholesterolemia   [facility] [name], MD   3 years ago SIADH (syndrome of inappropriate ADH production) (HCC)   [facility] [name], MD   3 years ago Chronic diarrhea   [facility] [name], [name], MD   5 years ago Great toe pain, right   [facility] Pickard, [name], MD       Future Appointments             In 1 month Kim Ortega [facility] Rhe[facility]ology - A Dept Of [place]. [facility]            Passed - Last BP in normal range    BP Readings from Last 1 Encounters:  02/17/23 126/86         Passed - Last Heart Rate in normal range    Pulse Readings from Last 1 Encounters:  02/17/23 90          losartan (COZAAR) 50 MG tablet [Pharmacy Med Name: LOSARTAN POTASSIUM 50 MG TAB] 30 tablet 0    Sig: TAKE 1 TABLET (50 MG) BY MOUTH IN THE MORNING AND AT BEDTIME     Cardiovascular:  Angiotensin Receptor Blockers Failed - 03/22/2023  9:43 AM      Failed - Valid encounter within last 6 months    Recent Outpatient Visits           2 years ago Acute viral conjunctivitis of left eye   [facility] [name], NP   2 years ago Pure hypercholesterolemia   [facility] [name], MD   3 years ago SIADH (syndrome of inappropriate ADH production) (HCC)   [facility] [name], MD   3 years ago Chronic  diarrhea   [facility] [name], [name], MD   5 years ago Great toe pain, right   [facility] Pickard, [name], MD       Future Appointments             In 1 month Kim Jupiter, [name] [facility] Rheumatology - A Dept Of [place]. [facility]            Passed - Cr in normal range and within 180 days    Creatinine  Date Value Ref Range Status  05/20/2016 0.8 0.6 - 1.1 mg/dL Final   Creat  Date Value Ref Range Status  02/08/2023 0.76 0.50 - 1.05 mg/dL Final         Passed - K in normal range and within 180 days    Potassium  Date Value Ref Range Status  02/08/2023 4.9 3.5 - 5.3 mmol/L Final  05/20/2016 3.8 3.5 - 5.1 mEq/L Final  Passed - Patient is not pregnant      Passed - Last BP in normal range    BP Readings from Last 1 Encounters:  02/17/23 126/86

## 2023-03-25 ENCOUNTER — Ambulatory Visit: Admitting: Family Medicine

## 2023-03-25 ENCOUNTER — Other Ambulatory Visit: Payer: Self-pay | Admitting: Family Medicine

## 2023-03-25 ENCOUNTER — Telehealth: Payer: Self-pay | Admitting: Rheumatology

## 2023-03-25 ENCOUNTER — Encounter: Payer: Self-pay | Admitting: Family Medicine

## 2023-03-25 ENCOUNTER — Other Ambulatory Visit: Payer: Self-pay | Admitting: Physician Assistant

## 2023-03-25 VITALS — BP 120/62 | HR 81 | Temp 97.6°F | Ht 64.0 in | Wt 115.6 lb

## 2023-03-25 DIAGNOSIS — Z122 Encounter for screening for malignant neoplasm of respiratory organs: Secondary | ICD-10-CM | POA: Diagnosis not present

## 2023-03-25 DIAGNOSIS — I6529 Occlusion and stenosis of unspecified carotid artery: Secondary | ICD-10-CM

## 2023-03-25 DIAGNOSIS — I10 Essential (primary) hypertension: Secondary | ICD-10-CM | POA: Diagnosis not present

## 2023-03-25 NOTE — Telephone Encounter (Signed)
 Pt called and left a voice message about her medication. Pt states that the methotrexate is making her sick and she would like to discontinue it. Pt stated she has an appointment in May and wanted to know if she should come sooner. Pt would like a call back about the medication and what she should do.

## 2023-03-25 NOTE — Telephone Encounter (Signed)
 Patient returned call to the office. Patient states she does not want to continue the MTX. Patient states she has all of the side effects. Patient would like to move up her appointment to discuss treatment options. Patient has rescheduled for 03/31/2023 at 9:30 am.

## 2023-03-25 NOTE — Telephone Encounter (Signed)
 Attempted to contact the patient and left message for patient to call the office.

## 2023-03-25 NOTE — Progress Notes (Signed)
 Subjective:    Patient ID: Kim Ortega, female    DOB: Mar 25, 1955, 68 y.o.   MRN: [id number] Patient has psoriatic arthritis and is currently seeing rheumatology.  She is on methotrexate but she is hoping to discontinue this soon.  She is also on Taltz.  She is due for the shingles vaccine.  She is due for a booster on the pneumonia vaccine.  Her mammogram is up-to-date.  She believes that her gynecologist has done a bone density on her recently although she cannot recall when.  She is due for lung cancer screening.  She also has a history of carotid artery stenosis and is due for Doppler to reevaluate for worsening of the carotid artery stenosis.  Unfortunately she continues to smoke.  Her blood pressure today is well-controlled.  She is overdue to check her cholesterol.  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 ,grade 1   COPD (chronic obstructive pulmonary disease) (HCC)    Episcleritis    Hormone replacement therapy    Hyperlipidemia    Hypertension    Left carotid artery stenosis    40-59% (01/2015)   Personal history of chemotherapy 2017   neoadjuvant   Personal history of radiation therapy 2018   Psoriasis    SIADH (syndrome of inappropriate ADH production) (HCC)    Smoker    Past Surgical History:  Procedure Laterality Date   ABDOMINAL HYSTERECTOMY     BREAST BIOPSY Left 06/2015   BREAST LUMPECTOMY Left 01/2016   BREAST LUMPECTOMY WITH RADIOACTIVE SEED AND SENTINEL LYMPH NODE BIOPSY Left 01/08/2016   Procedure: LEFT BREAST LUMPECTOMY WITH RADIOACTIVE SEED AND SENTINEL LYMPH NODE BIOPSY, BLUE DYE INJECTION;  Surgeon: Emelia Loron, MD;  Location: [facility];  Service: General;  Laterality: Left;   cervical acdf     C5-6- fusion with plates/screws "Dr. Newell Coral" '12   COLONOSCOPY WITH PROPOFOL N/A  04/16/2014   Procedure: COLONOSCOPY WITH PROPOFOL;  Surgeon: Charolett Bumpers, MD;  Location: [facility];  Service: Endoscopy;  Laterality: N/A;   COLPOSCOPY     laporotomy     Ovarian cyst   PORT-A-CATH REMOVAL Right 01/08/2016   Procedure: REMOVAL PORT-A-CATH;  Surgeon: Emelia Loron, MD;  Location:  SURGERY CENTER;  Service: General;  Laterality: Right;   PORTACATH PLACEMENT N/A 07/24/2015   Procedure: INSERTION PORT-A-CATH WITH [place];  Surgeon: Emelia Loron, MD;  Location: [facility] OR;  Service: General;  Laterality: N/A;   ROBOTIC ASSISTED SALPINGO OOPHERECTOMY Bilateral 10/10/2018   Procedure: XI ROBOTIC ASSISTED SALPINGO OOPHORECTOMY;  Surgeon: Adolphus Birchwood, MD;  Location: [facility];  Service: Gynecology;  Laterality: Bilateral;   TONSILLECTOMY     child   No Known Allergies Current Outpatient Medications on File Prior to Visit  Medication Sig Dispense Refill   ALPRAZolam (XANAX) 0.5 MG tablet Take 0.5 mg by mouth 3 (three) times daily as needed for anxiety.     amLODipine (NORVASC) 5 MG tablet Take 1 tablet (5 mg total) by mouth daily. 90 tablet 3   aspirin EC 81 MG tablet Take 81 mg by mouth daily.     atenolol (TENORMIN) 50 MG tablet TAKE 1 TABLET BY MOUTH DAILY. * NEEDS APPT FOR REFILLS 90 tablet 1   atorvastatin (LIPITOR) 40 MG tablet TAKE 1 TABLET BY  MOUTH EVERY DAY 90 tablet 2   Calcium Carbonate-Vitamin D (CALCIUM-D PO) Take 1 tablet by mouth 2 (two) times daily.      clobetasol (OLUX) 0.05 % topical foam Apply topically.     cyclobenzaprine (FLEXERIL) 10 MG tablet TAKE 1 TABLET BY MOUTH THREE TIMES A DAY AS NEEDED FOR MUSCLE SPASM 30 tablet 0   fluticasone (FLONASE) 50 MCG/ACT nasal spray SPRAY 2 SPRAYS INTO EACH NOSTRIL EVERY DAY 48 mL 2   folic acid (FOLVITE) 1 MG tablet Take 2 tablets (2 mg total) by mouth daily. 180 tablet 3   hydrochlorothiazide (HYDRODIURIL) 25 MG tablet Take 1 tablet (25 mg total) by mouth daily. (Patient not taking: Reported on 02/17/2023) 90  tablet 1   HYDROcodone-acetaminophen (NORCO) 5-325 MG tablet Take 1 tablet by mouth every 8 (eight) hours as needed for moderate pain (pain score 4-6). (Patient not taking: Reported on 02/17/2023) 30 tablet 0   HYDROcodone-acetaminophen (NORCO) 5-325 MG tablet Take 1 tablet by mouth every 8 (eight) hours as needed for moderate pain (pain score 4-6). 30 tablet 0   ibuprofen (ADVIL) 600 MG tablet Take 1 tablet (600 mg total) by mouth every 6 (six) hours as needed for moderate pain. For AFTER surgery only (Patient not taking: Reported on 02/17/2023) 30 tablet 0   ixekizumab (TALTZ) 80 MG/ML pen Inject 80mg  into the skin at Weeks 2, 4, 6, 8, 10, 12 2 mL 2   ixekizumab (TALTZ) 80 MG/ML pen Inject 1 mL (80 mg total) into the skin every 28 (twenty-eight) days. (Patient not taking: Reported on 02/17/2023) 1 mL 0   ixekizumab (TALTZ) 80 MG/ML pen Inject 1 mL (80 mg total) into the skin every 28 (twenty-eight) days. 3 mL 0   Ketotifen Fumarate (ALLERGY EYE DROPS OP) Place 1 drop into both eyes 2 (two) times daily as needed (allergies).     losartan (COZAAR) 50 MG tablet TAKE 1 TABLET (50 MG) BY MOUTH IN THE MORNING AND AT BEDTIME 30 tablet 0   meloxicam (MOBIC) 15 MG tablet TAKE 1 TABLET (15 MG TOTAL) BY MOUTH DAILY. (Patient not taking: Reported on 11/10/2022) 30 tablet 0   methotrexate (RHEUMATREX) 2.5 MG tablet TAKE 6 TABLETS BY MOUTH ONE TIME PER WEEK 72 tablet 0   Multiple Vitamin (MULTIVITAMIN WITH MINERALS) TABS tablet Take 1 tablet by mouth daily.     predniSONE (DELTASONE) 5 MG tablet Take 2 tabs by mouth daily x 1 week, then 1.5 tabs daily x 1 week, then 1 tab daily x 1 week, then half tablet daily x 1 week. (Patient not taking: Reported on 11/16/2022) 35 tablet 0   venlafaxine XR (EFFEXOR-XR) 37.5 MG 24 hr capsule TAKE 1 CAPSULE BY MOUTH DAILY WITH BREAKFAST. 90 capsule 2   Current Facility-Administered Medications on File Prior to Visit  Medication Dose Route Frequency Provider Last Rate Last Admin    0.9 %  sodium chloride infusion  500 mL Intravenous Once Nandigam, Eleonore Chiquito, MD       Social History   Socioeconomic History   Marital status: Married    Spouse name: Micheal   Number of children: 0   Years of education: Not on file   Highest education level: Not on file  Occupational History   Not on file  Tobacco Use   Smoking status: Every Day    Current packs/day: 0.50    Average packs/day: 0.5 packs/day for 20.0 years (10.0 ttl pk-yrs)    Types: Cigarettes    Passive  exposure: Current   Smokeless tobacco: Never  Vaping Use   Vaping status: Some Days  Substance and Sexual Activity   Alcohol use: Yes    Alcohol/week: 10.0 standard drinks of alcohol    Types: 10 Glasses of wine per week    Comment: Glasses of wine several times a week   Drug use: No   Sexual activity: Yes    Birth control/protection: Surgical    Comment: married  Other Topics Concern   Not on file  Social History Narrative   Married x 25 years.   Family lives nearby.   Social Drivers of Corporate investment banker Strain: Low Risk  (04/15/2022)   Overall Financial Resource Strain (CARDIA)    Difficulty of Paying Living Expenses: Not hard at all  Food Insecurity: No Food Insecurity (04/15/2022)   Hunger Vital Sign    Worried About Running Out of Food in the Last Year: Never true    Ran Out of Food in the Last Year: Never true  Transportation Needs: No Transportation Needs (04/15/2022)   PRAPARE - Administrator, Civil Service (Medical): No    Lack of Transportation (Non-Medical): No  Physical Activity: Insufficiently Active (04/15/2022)   Exercise Vital Sign    Days of Exercise per Week: 3 days    Minutes of Exercise per Session: 30 min  Stress: No Stress Concern Present (04/15/2022)   [employer] of Occupational Health - Occupational Stress Questionnaire    Feeling of Stress : Not at all  Social Connections: Socially Integrated (04/15/2022)   Social Connection and Isolation Panel  [NHANES]    Frequency of Communication with Friends and Family: More than three times a week    Frequency of Social Gatherings with Friends and Family: Three times a week    Attends Religious Services: More than 4 times per year    Active Member of Clubs or Organizations: Yes    Attends Banker Meetings: More than 4 times per year    Marital Status: Married  Catering manager Violence: Not At Risk (04/15/2022)   Humiliation, Afraid, Rape, and Kick questionnaire    Fear of Current or Ex-Partner: No    Emotionally Abused: No    Physically Abused: No    Sexually Abused: No     Review of Systems  Gastrointestinal:  Positive for diarrhea.  Musculoskeletal:  Positive for back pain.  All other systems reviewed and are negative.      Objective:   Physical Exam Vitals reviewed.  Constitutional:      General: She is not in acute distress.    Appearance: She is well-developed. She is not diaphoretic.  HENT:     Nose: No mucosal edema.     Right Sinus: No maxillary sinus tenderness.     Left Sinus: No maxillary sinus tenderness.  Cardiovascular:     Rate and Rhythm: Normal rate and regular rhythm.     Heart sounds: Normal heart sounds. No murmur heard. Pulmonary:     Effort: Pulmonary effort is normal. No respiratory distress.     Breath sounds: Normal breath sounds. No wheezing or rales.  Musculoskeletal:     Right wrist: Swelling, effusion, tenderness and bony tenderness present. No deformity. Decreased range of motion.     Cervical back: Pain with movement present.     Lumbar back: Bony tenderness present. No spasms or tenderness. Decreased range of motion.     Left hip: Decreased range of motion.  Skin:  Findings: No erythema.  Neurological:     Mental Status: She is alert.     Motor: No abnormal muscle tone.           Assessment & Plan:  Screening for lung cancer - Plan: CT CHEST LUNG CA SCREEN LOW DOSE W/O CM  Stenosis of carotid artery, unspecified  laterality - Plan: US Carotid Duplex Bilateral, Lipid panel  Benign essential HTN Given her tobacco abuse, I will screen the patient for lung cancer.  I will order a CT scan of the lung to do this.  Given her history of carotid artery disease I will repeat an ultrasound of the carotid Doppler to evaluate for any significant stenosis.  Check a fasting lipid panel today.  Goal LDL cholesterol is less than 55.  Blood pressure is acceptable.  Recommended the shingles vaccine.  Recommended the pneumonia vaccine specifically Capvaxive.  She would like to check with her rheumatologist first before receiving these.  She will check with her gynecologist and let me know when she had her last bone density test.  If greater than 3 years, I would reschedule this for her.

## 2023-03-26 ENCOUNTER — Other Ambulatory Visit: Payer: Self-pay | Admitting: Family Medicine

## 2023-03-26 LAB — LIPID PANEL
Cholesterol: 198 mg/dL (ref <200)
HDL: 121 mg/dL (ref >=50)
LDL Cholesterol (Calc): 64 mg/dL
Non-HDL Cholesterol (Calc): 77 mg/dL (ref <130)
Total CHOL/HDL Ratio: 1.6 (calc) (ref <5.0)
Triglycerides: 53 mg/dL (ref <150)

## 2023-03-28 MED ORDER — LOSARTAN POTASSIUM 50 MG PO TABS: ORAL_TABLET | ORAL | 1 refills | Status: DC

## 2023-03-28 NOTE — Telephone Encounter (Signed)
 Requested Prescriptions  Pending Prescriptions Disp Refills   losartan (COZAAR) 50 MG tablet [Pharmacy Med Name: LOSARTAN POTASSIUM 50 MG TAB] 30 tablet 0    Sig: TAKE 1 TABLET (50 MG) BY MOUTH IN THE MORNING AND AT BEDTIME     Cardiovascular:  Angiotensin Receptor Blockers Failed - 03/28/2023 12:36 PM      Failed - Valid encounter within last 6 months    Recent Outpatient Visits           2 years ago Acute viral conjunctivitis of left eye   [facility] [name], NP   2 years ago Pure hypercholesterolemia   [facility] [name], MD   3 years ago SIADH (syndrome of inappropriate ADH production) (HCC)   [facility] [name], MD   3 years ago Chronic diarrhea   [facility] [name], [name], MD   5 years ago Great toe pain, right   [facility] Pickard, [name], MD       Future Appointments             In 3 days Rachel Bo [facility]            Passed - Cr in normal range and within 180 days    Creatinine  Date Value Ref Range Status  05/20/2016 0.8 0.6 - 1.1 mg/dL Final   Creat  Date Value Ref Range Status  02/08/2023 0.76 0.50 - 1.05 mg/dL Final         Passed - K in normal range and within 180 days    Potassium  Date Value Ref Range Status  02/08/2023 4.9 3.5 - 5.3 mmol/L Final  05/20/2016 3.8 3.5 - 5.1 mEq/L Final         Passed - Patient is not pregnant      Passed - Last BP in normal range    BP Readings from Last 1 Encounters:  03/25/23 120/62

## 2023-03-28 NOTE — Progress Notes (Unsigned)
 Office Visit Note  Patient: Kim Ortega             Date of Birth: 1955/10/18           MRN: [id number]             PCP: [name], MD Referring: [name], MD Visit Date: 03/31/2023 Occupation: @GUAROCC @  Subjective:  No chief complaint on file.   History of Present Illness: [name] is a 68 y.o. female ***     Activities of Daily Living:  Patient reports morning stiffness for *** {minute/hour:19697}.   Patient {ACTIONS;DENIES/REPORTS:21021675::"Denies"} nocturnal pain.  Difficulty dressing/grooming: {ACTIONS;DENIES/REPORTS:21021675::"Denies"} Difficulty climbing stairs: {ACTIONS;DENIES/REPORTS:21021675::"Denies"} Difficulty getting out of chair: {ACTIONS;DENIES/REPORTS:21021675::"Denies"} Difficulty using hands for taps, buttons, cutlery, and/or writing: {ACTIONS;DENIES/REPORTS:21021675::"Denies"}  No Rheumatology ROS completed.   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   Antineoplastic chemotherapy induced anemia 08/27/2015   Breast cancer of lower-inner quadrant of left female breast (HCC) 07/03/2015   Left carotid artery stenosis    Psoriasis    Hormone replacement therapy    Smoker    Episcleritis     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 ,grade 1   COPD (chronic obstructive pulmonary disease) (HCC)    Episcleritis    Hormone replacement therapy    Hyperlipidemia     Hypertension    Left carotid artery stenosis    40-59% (01/2015)   Personal history of chemotherapy 2017   neoadjuvant   Personal history of radiation therapy 2018   Psoriasis    SIADH (syndrome of inappropriate ADH production) (HCC)    Smoker     Family History  Problem Relation Age of Onset   Stroke Mother    Colon polyps Sister    Colon cancer Brother    Esophageal cancer Neg Hx    Rectal cancer Neg Hx    Stomach cancer Neg Hx    Past Surgical History:  Procedure Laterality Date   ABDOMINAL HYSTERECTOMY     BREAST BIOPSY Left 06/2015   BREAST LUMPECTOMY Left 01/2016   BREAST LUMPECTOMY WITH RADIOACTIVE SEED AND SENTINEL LYMPH NODE BIOPSY Left 01/08/2016   Procedure: LEFT BREAST LUMPECTOMY WITH RADIOACTIVE SEED AND SENTINEL LYMPH NODE BIOPSY, BLUE DYE INJECTION;  Surgeon: Emelia Loron, MD;  Location: [facility];  Service: General;  Laterality: Left;   cervical acdf     C5-6- fusion with plates/screws "Dr. Newell Coral" '12   COLONOSCOPY WITH PROPOFOL N/A 04/16/2014   Procedure: COLONOSCOPY WITH PROPOFOL;  Surgeon: Charolett Bumpers, MD;  Location[facility];  Service: Endoscopy;  Laterality: N/A;   COLPOSCOPY     laporotomy     Ovarian cyst   PORT-A-CATH REMOVAL Right 01/08/2016   Procedure: REMOVAL PORT-A-CATH;  Surgeon: Emelia Loron, MD;  Location: [facility];  Service: General;  Laterality: Right;   PORTACATH PLACEMENT N/A 07/24/2015   Procedure: INSERTION PORT-A-CATH  WITH [place];  Surgeon: Emelia Loron, MD;  Location: [facility] OR;  Service: General;  Laterality: N/A;   ROBOTIC ASSISTED SALPINGO OOPHERECTOMY Bilateral 10/10/2018   Procedure: XI ROBOTIC ASSISTED SALPINGO OOPHORECTOMY;  Surgeon: Adolphus Birchwood, MD;  Location: [facility];  Service: Gynecology;  Laterality: Bilateral;   TONSILLECTOMY     child   Social History   Social History Narrative   Married x 25 years.   Family lives nearby.   Immunization History  Administered Date(s)  Administered   Influenza Split 11/04/2012   Influenza, Seasonal, Injecte, Preservative Fre 09/16/2022   Influenza,inj,Quad PF,6+ Mos 10/25/2016, 09/20/2018   Influenza-Unspecified 11/04/2013   MODERNA COVID-19 SARS-COV-2 PEDS BIVALENT BOOSTER 106yr-56yr 03/27/2019, 04/24/2019   Pneumococcal Polysaccharide-23 10/25/2016   Tdap 07/04/2009     Objective: Vital Signs: There were no vitals taken for this visit.   Physical Exam   Musculoskeletal Exam: ***  CDAI Exam: CDAI Score: -- Patient Global: --; Provider Global: -- Swollen: --; Tender: -- Joint Exam 03/31/2023   No joint exam has been documented for this visit   There is currently no information documented on the homunculus. Go to the Rheumatology activity and complete the homunculus joint exam.  Investigation: No additional findings.  Imaging: No results found.  Recent Labs: Lab Results  Component Value Date   WBC 7.8 02/08/2023   HGB 12.2 02/08/2023   PLT 338 02/08/2023   NA 137 02/08/2023   K 4.9 02/08/2023   CL 99 02/08/2023   CO2 28 02/08/2023   GLUCOSE 90 02/08/2023   BUN 18 02/08/2023   CREATININE 0.76 02/08/2023   BILITOT 0.4 02/08/2023   ALKPHOS 77 03/12/2019   AST 38 (H) 02/08/2023   ALT 25 02/08/2023   PROT 7.1 02/08/2023   ALBUMIN 4.2 03/12/2019   CALCIUM 10.1 02/08/2023   GFRAA 100 06/17/2020   QFTBGOLDPLUS NEGATIVE 10/19/2022    Speciality Comments: Tremfya stopped 11/15/2022, Taltz started 12/13/2022  Procedures:  No procedures performed Allergies: Patient has no known allergies.   Assessment / Plan:     Visit Diagnoses: No diagnosis found.  Orders: No orders of the defined types were placed in this encounter.  No orders of the defined types were placed in this encounter.   Face-to-face time spent with patient was *** minutes. Greater than 50% of time was spent in counseling and coordination of care.  Follow-Up Instructions: No follow-ups on file.   Ellen Henri, CMA  Note  - This record has been created using Animal nutritionist.  Chart creation errors have been sought, but may not always  have been located. Such creation errors do not reflect on  the standard of medical care.

## 2023-03-31 ENCOUNTER — Ambulatory Visit: Attending: Physician Assistant | Admitting: Physician Assistant

## 2023-03-31 ENCOUNTER — Encounter: Payer: Self-pay | Admitting: Physician Assistant

## 2023-03-31 ENCOUNTER — Other Ambulatory Visit: Payer: Self-pay

## 2023-03-31 VITALS — BP 100/66 | HR 79 | Resp 16 | Ht 64.0 in | Wt 115.6 lb

## 2023-03-31 DIAGNOSIS — H15102 Unspecified episcleritis, left eye: Secondary | ICD-10-CM | POA: Diagnosis not present

## 2023-03-31 DIAGNOSIS — Z13820 Encounter for screening for osteoporosis: Secondary | ICD-10-CM

## 2023-03-31 DIAGNOSIS — G620 Drug-induced polyneuropathy: Secondary | ICD-10-CM

## 2023-03-31 DIAGNOSIS — L409 Psoriasis, unspecified: Secondary | ICD-10-CM | POA: Diagnosis not present

## 2023-03-31 DIAGNOSIS — Z79899 Other long term (current) drug therapy: Secondary | ICD-10-CM

## 2023-03-31 DIAGNOSIS — M7061 Trochanteric bursitis, right hip: Secondary | ICD-10-CM | POA: Diagnosis not present

## 2023-03-31 DIAGNOSIS — I6522 Occlusion and stenosis of left carotid artery: Secondary | ICD-10-CM

## 2023-03-31 DIAGNOSIS — R768 Other specified abnormal immunological findings in serum: Secondary | ICD-10-CM | POA: Diagnosis not present

## 2023-03-31 DIAGNOSIS — M5030 Other cervical disc degeneration, unspecified cervical region: Secondary | ICD-10-CM | POA: Diagnosis not present

## 2023-03-31 DIAGNOSIS — M79641 Pain in right hand: Secondary | ICD-10-CM | POA: Diagnosis not present

## 2023-03-31 DIAGNOSIS — M25432 Effusion, left wrist: Secondary | ICD-10-CM

## 2023-03-31 DIAGNOSIS — L4050 Arthropathic psoriasis, unspecified: Secondary | ICD-10-CM

## 2023-03-31 DIAGNOSIS — C50312 Malignant neoplasm of lower-inner quadrant of left female breast: Secondary | ICD-10-CM

## 2023-03-31 DIAGNOSIS — M7062 Trochanteric bursitis, left hip: Secondary | ICD-10-CM

## 2023-03-31 DIAGNOSIS — Z170 Estrogen receptor positive status [ER+]: Secondary | ICD-10-CM

## 2023-03-31 DIAGNOSIS — Z981 Arthrodesis status: Secondary | ICD-10-CM

## 2023-03-31 DIAGNOSIS — M79642 Pain in left hand: Secondary | ICD-10-CM

## 2023-03-31 DIAGNOSIS — E222 Syndrome of inappropriate secretion of antidiuretic hormone: Secondary | ICD-10-CM

## 2023-03-31 DIAGNOSIS — D6481 Anemia due to antineoplastic chemotherapy: Secondary | ICD-10-CM

## 2023-03-31 DIAGNOSIS — Z79890 Hormone replacement therapy (postmenopausal): Secondary | ICD-10-CM

## 2023-03-31 DIAGNOSIS — T451X5A Adverse effect of antineoplastic and immunosuppressive drugs, initial encounter: Secondary | ICD-10-CM

## 2023-03-31 DIAGNOSIS — M47816 Spondylosis without myelopathy or radiculopathy, lumbar region: Secondary | ICD-10-CM

## 2023-03-31 MED ORDER — METHOTREXATE SODIUM 2.5 MG PO TABS: ORAL_TABLET | ORAL | 0 refills | Status: DC

## 2023-03-31 NOTE — Patient Instructions (Signed)
 Standing Labs We placed an order today for your standing lab work.   Please have your standing labs drawn in [place] and every 3 months   Please have your labs drawn 2 weeks prior to your appointment so that the provider can discuss your lab results at your appointment, if possible.  Please note that you [place] see your imaging and lab results in MyChart before we have reviewed them. We will contact you once all results are reviewed. Please allow our office up to 72 hours to thoroughly review all of the results before contacting the office for clarification of your results.  WALK-IN LAB HOURS  Monday through Thursday from 8:00 am -12:30 pm and 1:00 pm-5:00 pm and Friday from 8:00 am-12:00 pm.  Patients with office visits requiring labs will be seen before walk-in labs.  You [place] encounter longer than normal wait times. Please allow additional time. Wait times [place] be shorter on  Monday and Thursday afternoons.  We do not book appointments for walk-in labs. We appreciate your patience and understanding with our staff.   Labs are drawn by [facility]. Please bring your co-pay at the time of your lab draw.  You [place] receive a bill from [facility] for your lab work.  Please note if you are on Hydroxychloroquine and and an order has been placed for a Hydroxychloroquine level,  you will need to have it drawn 4 hours or more after your last dose.  If you wish to have your labs drawn at another location, please call the office 24 hours in advance so we can fax the orders.  The office is located at [address], [address], [place], [place] [address]   If you have any questions regarding directions or hours of operation,  please call [phone].   As a reminder, please drink plenty of water prior to coming for your lab work. Thanks!

## 2023-03-31 NOTE — Addendum Note (Signed)
 Addended by: Ellen Henri on: 03/31/2023 01:04 PM   Modules accepted: Orders

## 2023-03-31 NOTE — Progress Notes (Unsigned)
 Please review and sign "no print" methotrexate rx to reflect dose change that was advised today in the office. Thanks!

## 2023-04-04 ENCOUNTER — Other Ambulatory Visit: Payer: Self-pay | Admitting: Family Medicine

## 2023-04-04 ENCOUNTER — Encounter: Payer: Self-pay | Admitting: Family Medicine

## 2023-04-04 DIAGNOSIS — Z780 Asymptomatic menopausal state: Secondary | ICD-10-CM

## 2023-04-11 ENCOUNTER — Ambulatory Visit (HOSPITAL_COMMUNITY): Admission: RE | Admit: 2023-04-11 | Source: Ambulatory Visit

## 2023-04-11 ENCOUNTER — Ambulatory Visit (HOSPITAL_COMMUNITY)

## 2023-04-19 ENCOUNTER — Inpatient Hospital Stay: Payer: Medicare PPO | Attending: Hematology and Oncology | Admitting: Hematology and Oncology

## 2023-04-19 VITALS — BP 111/60 | HR 79 | Temp 98.3°F | Resp 18 | Ht 64.0 in | Wt 117.8 lb

## 2023-04-19 DIAGNOSIS — C50312 Malignant neoplasm of lower-inner quadrant of left female breast: Secondary | ICD-10-CM | POA: Insufficient documentation

## 2023-04-19 DIAGNOSIS — Z7982 Long term (current) use of aspirin: Secondary | ICD-10-CM | POA: Diagnosis not present

## 2023-04-19 DIAGNOSIS — Z79631 Long term (current) use of antimetabolite agent: Secondary | ICD-10-CM | POA: Diagnosis not present

## 2023-04-19 DIAGNOSIS — Z923 Personal history of irradiation: Secondary | ICD-10-CM | POA: Insufficient documentation

## 2023-04-19 DIAGNOSIS — Z79811 Long term (current) use of aromatase inhibitors: Secondary | ICD-10-CM | POA: Insufficient documentation

## 2023-04-19 DIAGNOSIS — Z9221 Personal history of antineoplastic chemotherapy: Secondary | ICD-10-CM | POA: Insufficient documentation

## 2023-04-19 DIAGNOSIS — Z79899 Other long term (current) drug therapy: Secondary | ICD-10-CM | POA: Diagnosis not present

## 2023-04-19 DIAGNOSIS — Z170 Estrogen receptor positive status [ER+]: Secondary | ICD-10-CM | POA: Insufficient documentation

## 2023-04-19 DIAGNOSIS — L4050 Arthropathic psoriasis, unspecified: Secondary | ICD-10-CM | POA: Diagnosis not present

## 2023-04-19 MED ORDER — VENLAFAXINE HCL ER 37.5 MG PO CP24
37.5000 mg | ORAL_CAPSULE | Freq: Every day | ORAL | 3 refills | Status: AC
Start: 2023-04-19 — End: ?

## 2023-04-19 NOTE — Assessment & Plan Note (Signed)
 Left breast biopsy 8:30 position: IDC grade 1, ER 90%, PR 0%, 50%, HER-2 Negative Ratio 1.21, Irregular Mass Left Breast 1 Cm from Nipple 2.5 x 2 x 2.5 Cm with Distortion,   T2 N0 Stage II a Clinical Stage Oncotype DX score 52, 34% risk of recurrence without chemotherapy   Treatment Summary: 1. Neoadjuvant chemotherapy with dose dense Adriamycin and Cytoxan 4 followed by Abraxane weekly 12; started 07/30/2015 completed 12/16/2015  2. 1/4/17Left Lumpectomy: IDC grade 1, 0.2 cm, Margins Neg, 0/1 LN Neg; [id number] 3. Adjuvant radiation therapy 02/10/16- 03/23/16    Prior treatment: Adjuvant anastrozole 1 mg daily 5-10 years switched to letrozole 11/19/2016 due to musculoskeletal aches and pains, switched to exemestane 03/12/2019 and finally discontinued April 2023 because of hot flashes      Breast cancer surveillance: 1.  Breast exam 04/19/2023: Benign 2.  Mammogram: 09/10/2022: No evidence of malignancy, breast density category C CT abdomen and MRI abdomen: Cystic left adnexal lesion (instructed her to follow-up with her GYN)   Psoriasis: Complete response to immunotherapy treatment.   Return to clinic in 1 year for follow-up

## 2023-04-19 NOTE — Progress Notes (Signed)
 Patient Care Team: [name], MD as PCP - General (Family Medicine) Serena Croissant, MD as Consulting Physician (Hematology and Oncology) Axel Filler, Larna Daughters, NP as Nurse Practitioner (Hematology and Oncology) Dorothy Puffer, MD as Consulting Physician (Radiation Oncology) Emelia Loron, MD as Consulting Physician (General Surgery) Elmon Else, MD as Consulting Physician (Dermatology) [facility], [facility]  DIAGNOSIS:  Encounter Diagnosis  Name Primary?   Malignant neoplasm of lower-inner quadrant of left breast in female, estrogen receptor positive (HCC) Yes    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 (HCC)  06/25/2015 Initial Diagnosis   Left breast biopsy 8:30 position: IDC grade 1, ER 90%, PR 0%, 50%, HER-2 Negative Ratio 1.21, Irregular Mass Left Breast 1 Cm from Nipple 2.5 x 2 x 2.5 Cm with Distortion, T2 N0 Stage II a Clinical Stage, Oncotype DX score 52, 34% ROR   07/30/2015 - 12/16/2015 Neo-Adjuvant Chemotherapy   Neoadjuvant chemotherapy with dose dense Adriamycin and Cytoxan 4 followed by Abraxane weekly 12   12/18/2015 Breast MRI   Excellent interval response to chemotherapy with only minimal residual enhancement, 2 tiny rounded regions along superior margin, unchanged, no abnormal lymph nodes    01/08/2016 Surgery   Left Lumpectomy Dwain Sarna): IDC grade 1, 0.2 cm, Margins Neg, 0/1 LN Neg; [id number]   02/10/2016 - 03/23/2016 Radiation Therapy   Adjuvant radiation   04/04/2016 - 04/14/2021 Anti-estrogen oral therapy   Anastrozole 1 mg daily switched to letrozole 01/04/2017     CHIEF COMPLIANT: Surveillance of breast cancer  HISTORY OF PRESENT ILLNESS:   History of Present Illness The patient, with a history of breast cancer in remission and a history of psoriasis and psoriatic arthritis, presents for an annual check-up. She reports feeling  'fine' with no chest issues or concerns. She has been managing her psoriasis with Tremfya injections, which cleared up most of the symptoms. However, due to the onset of psoriatic arthritis, her rheumatologist switched her to Taltz injections, which she administers once a month. She also takes methotrexate for the arthritis. She still has a few spots of scalp psoriasis.  The patient also mentions having arthritis, attributing it to age. She had a mammogram in September, the results of which were satisfactory.     ALLERGIES:  has no known allergies.  MEDICATIONS:  Current Outpatient Medications  Medication Sig Dispense Refill   amLODipine (NORVASC) 5 MG tablet Take 1 tablet (5 mg total) by mouth daily. 90 tablet 3   aspirin EC 81 MG tablet Take 81 mg by mouth daily.     atenolol (TENORMIN) 50 MG tablet TAKE 1 TABLET BY MOUTH DAILY. * NEEDS APPT FOR REFILLS 90 tablet 1   atorvastatin (LIPITOR) 40 MG tablet TAKE 1 TABLET BY MOUTH EVERY DAY 90 tablet 2   Calcium Carbonate-Vitamin D (CALCIUM-D PO) Take 1 tablet by mouth 2 (two) times daily.      clobetasol (OLUX) 0.05 % topical foam Apply topically.     folic acid (FOLVITE) 1 MG tablet Take 2 tablets (2 mg total) by mouth daily. 180 tablet 3   ixekizumab (TALTZ) 80 MG/ML pen Inject 1 mL (80 mg total) into the skin every 28 (twenty-eight) days. 3 mL 0   losartan (COZAAR) 50 MG tablet TAKE 1 TABLET (50 MG) BY MOUTH IN THE MORNING AND AT BEDTIME 90 tablet 1   methotrexate (RHEUMATREX) 2.5 MG tablet TAKE 4 TABLETS BY MOUTH ONE TIME PER  WEEK 48 tablet 0   Multiple Vitamin (MULTIVITAMIN WITH MINERALS) TABS tablet Take 1 tablet by mouth daily.     ALPRAZolam (XANAX) 0.5 MG tablet Take 0.5 mg by mouth 3 (three) times daily as needed for anxiety. (Patient not taking: Reported on 04/19/2023)     cyclobenzaprine (FLEXERIL) 10 MG tablet TAKE 1 TABLET BY MOUTH THREE TIMES A DAY AS NEEDED FOR MUSCLE SPASM (Patient not taking: Reported on 04/19/2023) 30 tablet 0    fluticasone (FLONASE) 50 MCG/ACT nasal spray SPRAY 2 SPRAYS INTO EACH NOSTRIL EVERY DAY (Patient not taking: Reported on 04/19/2023) 48 mL 2   HYDROcodone-acetaminophen (NORCO) 5-325 MG tablet Take 1 tablet by mouth every 8 (eight) hours as needed for moderate pain (pain score 4-6). (Patient not taking: Reported on 04/19/2023) 30 tablet 0   ibuprofen (ADVIL) 600 MG tablet Take 1 tablet (600 mg total) by mouth every 6 (six) hours as needed for moderate pain. For AFTER surgery only (Patient not taking: Reported on 04/19/2023) 30 tablet 0   ixekizumab (TALTZ) 80 MG/ML pen Inject 80mg  into the skin at [facility] 2, 4, 6, 8, 10, 12 (Patient not taking: Reported on 04/19/2023) 2 mL 2   ixekizumab (TALTZ) 80 MG/ML pen Inject 1 mL (80 mg total) into the skin every 28 (twenty-eight) days. (Patient not taking: Reported on 04/19/2023) 1 mL 0   Ketotifen Fumarate (ALLERGY EYE DROPS OP) Place 1 drop into both eyes 2 (two) times daily as needed (allergies). (Patient not taking: Reported on 04/19/2023)     venlafaxine XR (EFFEXOR-XR) 37.5 MG 24 hr capsule Take 1 capsule (37.5 mg total) by mouth daily with breakfast. 90 capsule 3   No current facility-administered medications for this visit.    PHYSICAL EXAMINATION: ECOG PERFORMANCE STATUS: 1 - Symptomatic but completely ambulatory  Vitals:   04/19/23 1054  BP: 111/60  Pulse: 79  Resp: 18  Temp: 98.3 F (36.8 C)  SpO2: 98%   Filed Weights   04/19/23 1054  Weight: 117 lb 12.8 oz (53.4 kg)    Physical Exam No palpable lumps or nodules in bilateral breasts or axilla  (exam performed in the presence of a chaperone)  LABORATORY DATA:  I have reviewed the data as listed    Latest Ref Rng & Units 02/08/2023    9:22 AM 12/13/2022   10:21 AM 11/16/2022   11:53 AM  CMP  Glucose 65 - 99 mg/dL 90  77  161   BUN 7 - 25 mg/dL 18  13  20    Creatinine 0.50 - 1.05 mg/dL 0.96  0.45  4.09   Sodium 135 - 146 mmol/L 137  136  135   Potassium 3.5 - 5.3 mmol/L 4.9  4.7  4.2    Chloride 98 - 110 mmol/L 99  99  96   CO2 20 - 32 mmol/L 28  25  28    Calcium 8.6 - 10.4 mg/dL 81.1  9.8  91.4   Total Protein 6.1 - 8.1 g/dL 7.1  6.8  7.1   Total Bilirubin 0.2 - 1.2 mg/dL 0.4  0.2  0.7   AST 10 - 35 U/L 38  19  50   ALT 6 - 29 U/L 25  12  46     Lab Results  Component Value Date   WBC 7.8 02/08/2023   HGB 12.2 02/08/2023   HCT 36.8 02/08/2023   MCV 107.6 (H) 02/08/2023   PLT 338 02/08/2023   NEUTROABS 5,288 02/08/2023  ASSESSMENT & PLAN:  Breast cancer of lower-inner quadrant of left female breast (HCC) Left breast biopsy 8:30 position: IDC grade 1, ER 90%, PR 0%, 50%, HER-2 Negative Ratio 1.21, Irregular Mass Left Breast 1 Cm from Nipple 2.5 x 2 x 2.5 Cm with Distortion,   T2 N0 Stage II a Clinical Stage Oncotype DX score 52, 34% risk of recurrence without chemotherapy   Treatment Summary: 1. Neoadjuvant chemotherapy with dose dense Adriamycin and Cytoxan 4 followed by Abraxane weekly 12; started 07/30/2015 completed 12/16/2015  2. 1/4/17Left Lumpectomy: IDC grade 1, 0.2 cm, Margins Neg, 0/1 LN Neg; ypT1aN0 3. Adjuvant radiation therapy 02/10/16- 03/23/16    Prior treatment: Adjuvant anastrozole 1 mg daily 5-10 years switched to letrozole 11/19/2016 due to musculoskeletal aches and pains, switched to exemestane 03/12/2019 and finally discontinued April 2023 because of hot flashes      Breast cancer surveillance: 1.  Breast exam 04/19/2023: Benign 2.  Mammogram: 09/10/2022: No evidence of malignancy, breast density category C CT abdomen and MRI abdomen: Cystic left adnexal lesion (instructed her to follow-up with her GYN)   Psoriasis: Complete response to immunotherapy treatment.   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 Recent  mammogram normal. - Perform breast examination to ensure no abnormalities.  Psoriasis with psoriatic arthritis Scalp psoriasis improved with Tremfya. Psoriatic arthritis controlled with Taltz and methotrexate. - Continue Taltz injections monthly. - Continue methotrexate for psoriatic arthritis.      No orders of the defined types were placed in this encounter.  The patient has a good understanding of the overall plan. she agrees with it. she will call with any problems that may develop before the next visit here. Total time spent: 30 mins including face to face time and time spent for planning, charting and co-ordination of care   Viinay K Doretha Goding, MD 04/19/23

## 2023-04-21 ENCOUNTER — Telehealth: Payer: Self-pay | Admitting: Hematology and Oncology

## 2023-04-21 NOTE — Telephone Encounter (Signed)
 Spoke with patient confirming upcoming appointment

## 2023-04-25 ENCOUNTER — Other Ambulatory Visit: Payer: Self-pay | Admitting: Family Medicine

## 2023-04-25 ENCOUNTER — Other Ambulatory Visit: Payer: Self-pay

## 2023-04-25 ENCOUNTER — Other Ambulatory Visit: Payer: Self-pay | Admitting: Physician Assistant

## 2023-04-25 DIAGNOSIS — I10 Essential (primary) hypertension: Secondary | ICD-10-CM

## 2023-04-25 NOTE — Telephone Encounter (Signed)
 Prescription Request  04/25/2023  LOV: 03/25/23  What is the name of the medication or equipment? atorvastatin  (LIPITOR) 40 MG tablet [[id number]]   Have you contacted your pharmacy to request a refill? Yes   Which pharmacy would you like this sent to?  [id number] Jonette Nestle, [place] - [address] [place] [address] Phone: [phone] Fax: [phone]    Patient notified that their request is being sent to the clinical staff for review and that they should receive a response within 2 business days.   Please advise at [facility] [phone]

## 2023-04-25 NOTE — Telephone Encounter (Signed)
 Last Fill: 02/14/2023  Labs: 02/08/2023 RBC count is low.  MCV and MCH are elevated--please clarify if she has been taking folic acid ? Vitamin B12 and folate WNL  AST is elevated-38.  ALT WNL.  Avoid the use of tylenol  and alcohol use.  Rest of CMP WNL  Next Visit: 07/06/2023  Last Visit: 03/31/2023  DX: Psoriatic arthropathy   Current Dose per office note 03/31/2023: reduce methotrexate  from 6 tablets/wk to 4 tablets by mouth once weekly   Okay to refill Methotrexate ?

## 2023-04-26 MED ORDER — ATORVASTATIN CALCIUM 40 MG PO TABS: 40.0000 mg | ORAL_TABLET | Freq: Every day | ORAL | 0 refills | Status: DC

## 2023-04-26 NOTE — Telephone Encounter (Signed)
 Requested too soon. Requested Prescriptions  Pending Prescriptions Disp Refills   losartan  (COZAAR ) 50 MG tablet [Pharmacy Med Name: LOSARTAN  POTASSIUM 50 MG TAB] 180 tablet     Sig: TAKE 1 TABLET (50 MG) BY MOUTH IN THE MORNING AND AT BEDTIME     Cardiovascular:  Angiotensin Receptor Blockers Failed - 04/26/2023  2:06 PM      Failed - Valid encounter within last 6 months    Recent Outpatient Visits           1 month ago Screening for lung cancer   [place] [facility] Austine Lefort, MD   7 months ago Right hand pain   [place] [facility] Cheril Cork, Cisco Crest, MD   7 months ago Right hand pain   [place] [facility] Jenelle Mis, FNP   10 months ago Benign essential HTN   [place] [facility] Pickard, Cisco Crest, MD   1 year ago Polyarthralgia   [place] [facility] Pickard, Cisco Crest, MD       Future Appointments             In 2 months Eddie Good Larwance Poe, PA-C [facility] Health Rheumatology - A Dept Of [place]. [facility]            Passed - Cr in normal range and within 180 days    Creatinine  Date Value Ref Range Status  05/20/2016 0.8 0.6 - 1.1 mg/dL Final   Creat  Date Value Ref Range Status  02/08/2023 0.76 0.50 - 1.05 mg/dL Final         Passed - K in normal range and within 180 days    Potassium  Date Value Ref Range Status  02/08/2023 4.9 3.5 - 5.3 mmol/L Final  05/20/2016 3.8 3.5 - 5.1 mEq/L Final         Passed - Patient is not pregnant      Passed - Last BP in normal range    BP Readings from Last 1 Encounters:  04/19/23 111/60          atenolol  (TENORMIN ) 50 MG tablet [Pharmacy Med Name: ATENOLOL  50 MG TABLET] 90 tablet 1    Sig: TAKE 1 TABLET BY MOUTH DAILY. * NEEDS APPT FOR REFILLS     Cardiovascular: Beta Blockers 2 Failed - 04/26/2023  2:06 PM      Failed - Valid encounter within last 6 months    Recent Outpatient Visits            1 month ago Screening for lung cancer   [place] [facility] Medicine Austine Lefort, MD   7 months ago Right hand pain   [place] [facility] Family Medicine Cheril Cork, Cisco Crest, MD   7 months ago Right hand pain   [place] [facility] Medicine Jenelle Mis, FNP   10 months ago Benign essential HTN   [place] [facility] Medicine Pickard, Cisco Crest, MD   1 year ago Polyarthralgia   [place] [facility] Medicine Pickard, Cisco Crest, MD       Future Appointments             In 2 months Eddie Good [place] [facility] Health Rheumatology - A Dept Of [place]. [facility]            Passed - Cr in normal range and  within 360 days    Creatinine  Date Value Ref Range Status  05/20/2016 0.8 0.6 - 1.1 mg/dL Final   Creat  Date Value Ref Range Status  02/08/2023 0.76 0.50 - 1.05 mg/dL Final         Passed - Last BP in normal range    BP Readings from Last 1 Encounters:  04/19/23 111/60         Passed - Last Heart Rate in normal range    Pulse Readings from Last 1 Encounters:  04/19/23 79          amLODipine  (NORVASC ) 5 MG tablet [Pharmacy Med Name: AMLODIPINE  BESYLATE 5 MG TAB] 90 tablet 3    Sig: TAKE 1 TABLET (5 MG TOTAL) BY MOUTH DAILY.     Cardiovascular: Calcium  Channel Blockers 2 Failed - 04/26/2023  2:06 PM      Failed - Valid encounter within last 6 months    Recent Outpatient Visits           1 month ago Screening for lung cancer   [place] [facility] Medicine Austine Lefort, MD   7 months ago Right hand pain   [place] [facility] Cheril Cork, Cisco Crest, MD   7 months ago Right hand pain   [place] [facility] Family Medicine Jenelle Mis, FNP   10 months ago Benign essential HTN   [place] [facility] Medicine Pickard, Cisco Crest, MD   1 year ago Polyarthralgia    [facility] Pickard, Cisco Crest, MD       Future Appointments              In 2 months Eddie Good [place] [facility] Rheumatology - A Dept Of [place]. [facility]            Passed - Last BP in normal range    BP Readings from Last 1 Encounters:  04/19/23 111/60         Passed - Last Heart Rate in normal range    Pulse Readings from Last 1 Encounters:  04/19/23 79

## 2023-04-26 NOTE — Telephone Encounter (Signed)
 Requested Prescriptions  Pending Prescriptions Disp Refills   atorvastatin  (LIPITOR) 40 MG tablet 90 tablet 0    Sig: Take 1 tablet (40 mg total) by mouth daily.     Cardiovascular:  Antilipid - Statins Failed - 04/26/2023  2:54 PM      Failed - Lipid Panel in normal range within the last 12 months    Cholesterol  Date Value Ref Range Status  03/25/2023 198 <200 mg/dL Final   LDL Cholesterol (Calc)  Date Value Ref Range Status  03/25/2023 64 mg/dL (calc) Final    Comment:    Reference range: <100 . Desirable range <100 mg/dL for primary prevention;   <70 mg/dL for patients with CHD or diabetic patients  with > or = 2 CHD risk factors. Aaron Aas LDL-C is now calculated using the Martin-Hopkins  calculation, which is a validated novel method providing  better accuracy than the Friedewald equation in the  estimation of LDL-C.  Melinda Sprawls et al. Erroll Heard. 9562;130(86): 2061-2068  (http://education.[employer].com/faq/FAQ164)    HDL  Date Value Ref Range Status  03/25/2023 121 > OR = 50 mg/dL Final   Triglycerides  Date Value Ref Range Status  03/25/2023 53 <150 mg/dL Final         Passed - Patient is not pregnant      Passed - Valid encounter within last 12 months    Recent Outpatient Visits           1 month ago Screening for lung cancer   [place] [facility] Medicine Austine Lefort, MD   7 months ago Right hand pain   [place] [facility] Family Medicine Cheril Cork, Cisco Crest, MD   7 months ago Right hand pain   [place] [facility] Family Medicine Jenelle Mis, FNP   10 months ago Benign essential HTN   [place] [facility] Medicine Pickard, Cisco Crest, MD   1 year ago Polyarthralgia   [place] [facility] Family Medicine Pickard, Cisco Crest, MD       Future Appointments             In 2 months Eddie Good Larwance Poe, PA-C [facility] Health Rheumatology - A Dept Of [place]. [facility]

## 2023-04-27 ENCOUNTER — Telehealth: Payer: Self-pay

## 2023-04-27 DIAGNOSIS — Z122 Encounter for screening for malignant neoplasm of respiratory organs: Secondary | ICD-10-CM

## 2023-04-27 DIAGNOSIS — F17210 Nicotine dependence, cigarettes, uncomplicated: Secondary | ICD-10-CM

## 2023-04-27 DIAGNOSIS — Z87891 Personal history of nicotine dependence: Secondary | ICD-10-CM

## 2023-04-27 NOTE — Telephone Encounter (Signed)
.  Lung Cancer Screening Narrative/Criteria Questionnaire (Cigarette Smokers Only- No Cigars/Pipes/vapes)   Kim Ortega   SDMV: 05/10/2023 at 11:30 am with Kim Ortega        09-26-1955   LDCT: 05/11/2023 at 10:40 am at [facility]    68 y.o.   Phone: [phone]  Lung Screening Narrative (confirm age 80-77 yrs Medicare / 50-80 yrs Private pay insurance)   Insurance information: [employer]   Referring Provider:  Cheril Cork   This screening involves an initial phone call with a team member from our program. It is called a shared decision making visit. The initial meeting is required by  insurance and Medicare to make sure you understand the program. This appointment takes about 15-20 minutes to complete. You will complete the screening scan at your scheduled date/time.  This scan takes about 5-10 minutes to complete. You can eat and drink normally before and after the scan.  Criteria questions for Lung Cancer Screening:   Are you a current or former smoker? Current Age began smoking: 55   If you are a former smoker, what year did you quit smoking? (within 15 yrs)   To calculate your smoking history, I need an accurate estimate of how many packs of cigarettes you smoked per day and for how many years. (Not just the number of PPD you are now smoking)   Years smoking 50 x Packs per day 2 = Pack years 100   (at least 20 pack yrs)   (Make sure they understand that we need to know how much they have smoked in the past, not just the number of PPD they are smoking now)  Do you have a personal history of cancer?  Yes - (type and when diagnosed - 5 yrs cancer free) Breast Cancer 2017    Do you have a family history of cancer? Yes  (cancer type and and relative) Brother Colon Cancer  Are you coughing up blood?  No  Have you had unexplained weight loss of 15 lbs or more in the last 6 months? No  It looks like you meet all criteria.  When would be a good time for us  to schedule you for  this screening?   Additional information: N/A

## 2023-05-05 ENCOUNTER — Other Ambulatory Visit: Payer: Self-pay | Admitting: Family Medicine

## 2023-05-05 MED ORDER — HYDROCODONE-ACETAMINOPHEN 5-325 MG PO TABS
1.0000 | ORAL_TABLET | Freq: Three times a day (TID) | ORAL | 0 refills | Status: DC | PRN
Start: 2023-05-05 — End: 2023-06-02

## 2023-05-06 ENCOUNTER — Encounter: Payer: Self-pay | Admitting: Acute Care

## 2023-05-10 ENCOUNTER — Encounter

## 2023-05-11 ENCOUNTER — Ambulatory Visit

## 2023-05-18 ENCOUNTER — Ambulatory Visit: Payer: Medicare PPO | Admitting: Physician Assistant

## 2023-05-18 ENCOUNTER — Other Ambulatory Visit: Payer: Self-pay | Admitting: *Deleted

## 2023-05-18 ENCOUNTER — Ambulatory Visit: Payer: Self-pay | Admitting: Physician Assistant

## 2023-05-18 DIAGNOSIS — Z79899 Other long term (current) drug therapy: Secondary | ICD-10-CM

## 2023-05-18 NOTE — Progress Notes (Signed)
 RBC count remains low.  MCV and MCH are elevated. Please add vitamin B12 and folate

## 2023-05-19 NOTE — Progress Notes (Signed)
 Sodium is low-133. Rest of CMP WNL

## 2023-05-20 LAB — COMPREHENSIVE METABOLIC PANEL WITH GFR
AG Ratio: 1.5 (calc) (ref 1.0–2.5)
ALT: 20 U/L (ref 6–29)
AST: 35 U/L (ref 10–35)
Albumin: 4.1 g/dL (ref 3.6–5.1)
Alkaline phosphatase (APISO): 98 U/L (ref 37–153)
BUN: 12 mg/dL (ref 7–25)
CO2: 29 mmol/L (ref 20–32)
Calcium: 9.7 mg/dL (ref 8.6–10.4)
Chloride: 98 mmol/L (ref 98–110)
Creat: 0.73 mg/dL (ref 0.50–1.05)
Globulin: 2.8 g/dL (ref 1.9–3.7)
Glucose, Bld: 97 mg/dL (ref 65–99)
Potassium: 4.4 mmol/L (ref 3.5–5.3)
Sodium: 133 mmol/L — ABNORMAL LOW (ref 135–146)
Total Bilirubin: 0.5 mg/dL (ref 0.2–1.2)
Total Protein: 6.9 g/dL (ref 6.1–8.1)
eGFR: 90 mL/min/{1.73_m2} (ref >=60)

## 2023-05-20 LAB — TEST AUTHORIZATION

## 2023-05-20 LAB — CBC WITH DIFFERENTIAL/PLATELET
Absolute Lymphocytes: 1710 {cells}/uL (ref 850–3900)
Absolute Monocytes: 864 {cells}/uL (ref 200–950)
Basophils Absolute: 36 {cells}/uL (ref 0–200)
Basophils Relative: 0.4 %
Eosinophils Absolute: 90 {cells}/uL (ref 15–500)
Eosinophils Relative: 1 %
HCT: 37.5 % (ref 35.0–45.0)
Hemoglobin: 12.3 g/dL (ref 11.7–15.5)
MCH: 36.3 pg — ABNORMAL HIGH (ref 27.0–33.0)
MCHC: 32.8 g/dL (ref 32.0–36.0)
MCV: 110.6 fL — ABNORMAL HIGH (ref 80.0–100.0)
MPV: 9.2 fL (ref 7.5–12.5)
Monocytes Relative: 9.6 %
Neutro Abs: 6300 {cells}/uL (ref 1500–7800)
Neutrophils Relative %: 70 %
Platelets: 335 10*3/uL (ref 140–400)
RBC: 3.39 10*6/uL — ABNORMAL LOW (ref 3.80–5.10)
RDW: 12.9 % (ref 11.0–15.0)
Total Lymphocyte: 19 %
WBC: 9 10*3/uL (ref 3.8–10.8)

## 2023-05-20 LAB — B12 AND FOLATE PANEL
Folate: >24 ng/mL
Vitamin B-12: 598 pg/mL (ref 200–1100)

## 2023-05-20 NOTE — Progress Notes (Signed)
Folate and vitamin B12 WNL.

## 2023-05-29 ENCOUNTER — Other Ambulatory Visit: Payer: Self-pay | Admitting: Physician Assistant

## 2023-05-29 ENCOUNTER — Other Ambulatory Visit: Payer: Self-pay | Admitting: Family Medicine

## 2023-05-29 DIAGNOSIS — I10 Essential (primary) hypertension: Secondary | ICD-10-CM

## 2023-06-02 ENCOUNTER — Encounter: Payer: Self-pay | Admitting: Family Medicine

## 2023-06-02 ENCOUNTER — Other Ambulatory Visit: Payer: Self-pay

## 2023-06-03 MED ORDER — HYDROCODONE-ACETAMINOPHEN 5-325 MG PO TABS: 1.0000 | ORAL_TABLET | Freq: Three times a day (TID) | ORAL | 0 refills | Status: DC | PRN

## 2023-06-22 NOTE — Progress Notes (Unsigned)
 Office Visit Note  Patient: Kim Ortega             Date of Birth: [birth year]           MRN: [id number]             PCP: Duanne Butler DASEN, MD Referring: Duanne Butler DASEN, MD Visit Date: 07/06/2023 Occupation: @GUAROCC @  Subjective:  Recurrent flares   History of Present Illness: Wilson Sample is a 68 y.o. female with history of psoriatic arthritis.  Patient remains on taltz  80 mg sq injections every 28 days and methotrexate  6 tablets by mouth once weekly.  Patient reports she took methotrexate  at a reduced dose of 4 tablets weekly for 1 month but increased the dose to 6 tablets weekly due to experiencing recurrent flares.  She continues to have recurrent migratory flares despite no reason gaps in therapy.  She states she is also having significant GI side effects after taking oral methotrexate  lasting up to 3 days after her weekly dose.  She would like to discuss switching to injectable methotrexate .   She states taltz  has not been clearing the psoriasis on her scalp as effectively as tremfya previously.  Patient reports that she has only noticed about a 10% improvement in her joint pain and inflammation since switching from Tremfya to Taltz .  She woke up this morning experiencing pain, swelling, and redness of the left wrist.  She is also having ongoing discomfort in her right shoulder and both knees.  She denies any Achilles tendinitis or plantar fasciitis.  She denies any SI joint pain at this time.   Activities of Daily Living:  Patient reports morning stiffness for 4 hours.   Patient Reports nocturnal pain.  Difficulty dressing/grooming: Reports Difficulty climbing stairs: Reports Difficulty getting out of chair: Reports Difficulty using hands for taps, buttons, cutlery, and/or writing: Reports  Review of Systems  Constitutional:  Positive for fatigue.  HENT:  Positive for mouth sores. Negative for mouth dryness.   Eyes:  Negative for dryness.  Respiratory:   Negative for shortness of breath.   Cardiovascular:  Negative for chest pain and palpitations.  Gastrointestinal:  Positive for diarrhea. Negative for blood in stool and constipation.  Endocrine: Negative for increased urination.  Genitourinary:  Negative for involuntary urination.  Musculoskeletal:  Positive for joint pain, joint pain, joint swelling, myalgias, muscle weakness, morning stiffness, muscle tenderness and myalgias. Negative for gait problem.  Skin:  Positive for color change. Negative for rash, hair loss and sensitivity to sunlight.  Allergic/Immunologic: Negative for susceptible to infections.  Neurological:  Positive for headaches. Negative for dizziness.  Hematological:  Negative for swollen glands.  Psychiatric/Behavioral:  Negative for depressed mood and sleep disturbance. The patient is not nervous/anxious.     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   Antineoplastic chemotherapy induced anemia 08/27/2015   Breast cancer of lower-inner quadrant of left female breast (HCC) 07/03/2015   Left carotid artery stenosis    Psoriasis    Hormone replacement therapy    Smoker    Episcleritis     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 ,grade 1   COPD (chronic obstructive pulmonary disease) (HCC)    Episcleritis    Hormone replacement therapy    Hyperlipidemia    Hypertension    Left carotid artery stenosis    40-59%  (01/2015)   Personal history of chemotherapy 2017   neoadjuvant   Personal history of radiation therapy 2018   Psoriasis    SIADH (syndrome of inappropriate ADH production) (HCC)    Smoker     Family History  Problem Relation Age of Onset   Stroke Mother    Colon polyps Sister    Colon cancer Brother    Esophageal cancer Neg Hx    Rectal cancer Neg Hx    Stomach cancer Neg Hx    Past Surgical History:  Procedure Laterality Date   ABDOMINAL HYSTERECTOMY     BREAST BIOPSY Left 06/2015   BREAST LUMPECTOMY Left 01/2016   BREAST LUMPECTOMY WITH RADIOACTIVE SEED AND SENTINEL LYMPH NODE BIOPSY Left 01/08/2016   Procedure: LEFT BREAST LUMPECTOMY WITH RADIOACTIVE SEED AND SENTINEL LYMPH NODE BIOPSY, BLUE DYE INJECTION;  Surgeon: Donnice Bury, MD;  Location: [facility];  Service: General;  Laterality: Left;   cervical acdf     C5-6- fusion with plates/screws Dr. Alix '12   COLONOSCOPY WITH PROPOFOL  N/A 04/16/2014   Procedure: COLONOSCOPY WITH PROPOFOL ;  Surgeon: Gladis MARLA Louder, MD;  Location: [facility];  Service: Endoscopy;  Laterality: N/A;   COLPOSCOPY     laporotomy     Ovarian cyst   PORT-A-CATH REMOVAL Right 01/08/2016   Procedure: REMOVAL PORT-A-CATH;  Surgeon: Donnice Bury, MD;  Location: [facility];  Service: General;  Laterality: Right;   PORTACATH PLACEMENT N/A 07/24/2015   Procedure: INSERTION PORT-A-CATH WITH US ;  Surgeon: Donnice Bury, MD;  Location: [facility];  Service: General;  Laterality: N/A;   ROBOTIC ASSISTED SALPINGO OOPHERECTOMY Bilateral 10/10/2018   Procedure: XI ROBOTIC ASSISTED SALPINGO OOPHORECTOMY;  Surgeon: Eloy Herring, MD;  Location: [facility];  Service: Gynecology;  Laterality: Bilateral;   TONSILLECTOMY     child   Social History   Social History Narrative   Married x 25 years.   Family lives nearby.   Immunization History  Administered Date(s) Administered   Influenza Split 11/04/2012   Influenza,  Seasonal, Injecte, Preservative Fre 09/16/2022   Influenza,inj,Quad PF,6+ Mos 10/25/2016, 09/20/2018   Influenza-Unspecified 11/04/2013   MODERNA COVID-19 SARS-COV-2 PEDS BIVALENT BOOSTER 83yr-47yr 03/27/2019, 04/24/2019   Pneumococcal Polysaccharide-23 10/25/2016   Tdap 07/04/2009     Objective: Vital Signs: BP 110/72 (BP Location: Right Arm, Patient Position: Sitting, Cuff Size: Normal)   Pulse 81   Resp 16   Ht 5' 4 (1.626 m)   Wt 116 lb (52.6 kg)   BMI 19.91 kg/m    Physical Exam Vitals and nursing note reviewed.  Constitutional:      Appearance: She is well-developed.  HENT:     Head: Normocephalic and atraumatic.  Eyes:     Conjunctiva/sclera: Conjunctivae normal.  Cardiovascular:     Rate and Rhythm: Normal rate and regular rhythm.     Heart sounds: Normal heart sounds.  Pulmonary:     Effort: Pulmonary effort is normal.     Breath sounds: Normal breath sounds.  Abdominal:  General: Bowel sounds are normal.     Palpations: Abdomen is soft.  Musculoskeletal:     Cervical back: Normal range of motion.  Lymphadenopathy:     Cervical: No cervical adenopathy.  Skin:    General: Skin is warm and dry.     Capillary Refill: Capillary refill takes less than 2 seconds.  Neurological:     Mental Status: She is alert and oriented to person, place, and time.  Psychiatric:        Behavior: Behavior normal.      Musculoskeletal Exam: C-spine has limited range of motion.  No SI joint tenderness upon palpation.  Discomfort with range of motion of both shoulders with tenderness of the right shoulder.  Elbow joints have good range of motion with no tenderness along the joint line.  Limited extension of the left wrist with tenderness and erythema on the dorsal aspect.  No tenderness or synovitis over MCP joints.  Complete fist formation bilaterally.  Hip joints have good range of motion no groin pain.  Painful range of motion of both knee joints but no effusion noted.  Ankle  joints have good range of motion without tenderness or joint swelling.  No evidence of Achilles tendinitis or plantar fasciitis.  CDAI Exam: CDAI Score: -- Patient Global: --; Provider Global: -- Swollen: --; Tender: -- Joint Exam 07/06/2023   No joint exam has been documented for this visit   There is currently no information documented on the homunculus. Go to the Rheumatology activity and complete the homunculus joint exam.  Investigation: No additional findings.  Imaging: No results found.  Recent Labs: Lab Results  Component Value Date   WBC 9.0 05/18/2023   HGB 12.3 05/18/2023   PLT 335 05/18/2023   NA 133 (L) 05/18/2023   K 4.4 05/18/2023   CL 98 05/18/2023   CO2 29 05/18/2023   GLUCOSE 97 05/18/2023   BUN 12 05/18/2023   CREATININE 0.73 05/18/2023   BILITOT 0.5 05/18/2023   ALKPHOS 77 03/12/2019   AST 35 05/18/2023   ALT 20 05/18/2023   PROT 6.9 05/18/2023   ALBUMIN 4.2 03/12/2019   CALCIUM  9.7 05/18/2023   GFRAA 100 06/17/2020   QFTBGOLDPLUS NEGATIVE 10/19/2022    Speciality Comments: Tremfya stopped 11/15/2022, Taltz  started 12/13/2022  Procedures:  No procedures performed Allergies: Patient has no known allergies.    Assessment / Plan:     Visit Diagnoses: Psoriatic arthropathy [facility]): Patient presents today experiencing a flare in the left wrist.  She has continued to have recurrent flares despite remaining on Taltz  80 mg subcutaneous injections every 28 days and methotrexate  6 tablets by mouth once weekly.  She has not had any recent gaps in therapy.  She has been having flares intermittently in the right shoulder, left wrist, and both knees.  Patient was taking methotrexate  at a reduced dose of 4 tablets weekly for about 1 month but had to increase the dose back to 6 tablets weekly due to ongoing joint pain and inflammation.  She has been experiencing significant GI side effects on oral methotrexate  and would like to switch to injectable methotrexate .   Plan to send in a prescription for methotrexate  0.6 ml sq injections once weekly.  Discussed the option of switching Biologics since her symptoms are inadequately controlled on the current treatment regimen.  She is apprehensive to discontinue Taltz  at this time.  Patient would like to return in early August 2025 to have a Depo-Medrol  injection prior to going on  her family vacation to help alleviate her symptoms of a flare.  She is aware of the risks of frequent and long-term steroid use.  She will follow-up in the office in 3 months or sooner if needed.  Psoriasis: Scalp--She does not feel that her psoriasis is as well-controlled on Taltz  as it was when she was on Tremfya.  She is apprehensive to make any major medication changes at this time.  She was in agreement to switch from oral to injectable methotrexate .  High risk medication use - Taltz  80 mg sq injections every 28 days--started 12/13/22. Plan to switch the patient from oral to injectable methotrexate .   CBC and CMP updated on 05/18/23. Her next lab work will be due in August and every 3 months.  TB gold negative on 10/19/22.  No recurrent infections. Discussed the importance of holding taltz  and methotrexate  if she develops signs or symptoms of an infection and to resume once the infection has completely cleared.  Rheumatoid factor positive - Rheumatoid factor 24, anti-CCP negative, normal, CRP negative.  Episcleritis of left eye: Not currently symptomatic.   Swelling of left wrist: Patient presents today with a recurrence of pain and inflammation in the left wrist.  She has tenderness and erythema on the dorsal aspect of the left wrist.  Pain in both hands: She experiences intermittent pain and inflammation involving both hands.  On examination today she has tenderness and erythema on the dorsal aspect of the left wrist.   DDD (degenerative disc disease), cervical: C-spine has limited range of motion with lateral rotation.  No symptoms of  radiculopathy at this time.  S/P cervical spinal fusion: Limited range of motion.  Spondylosis of lumbar spine - MRI January 12, 2021 showed mild degeneration with foraminal stenosis.  Epidural injection by Dr. Eldonna in April 2023 relieved pain.  Trochanteric bursitis of both hips: Intermittent discomfort.  Other medical conditions are listed as follows:   Malignant neoplasm of lower-inner quadrant of left breast in female, estrogen receptor positive [facility]) - dxd June 2017.  Antineoplastic chemotherapy induced anemia  Chemotherapy-induced peripheral neuropathy (HCC)  Left carotid artery stenosis  Hormone replacement therapy  SIADH (syndrome of inappropriate ADH production) (HCC)  Osteoporosis screening: DEXA normal in 2018.  Patient plans on having her PCP place an order for a new bone density- we can review results at her next follow up visit.  Orders: No orders of the defined types were placed in this encounter.  No orders of the defined types were placed in this encounter.    Follow-Up Instructions: Return in about 3 months (around 10/06/2023) for Psoriatic arthritis.   Waddell CHRISTELLA Craze, PA-C  Note - This record has been created using Dragon software.  Chart creation errors have been sought, but may not always  have been located. Such creation errors do not reflect on  the standard of medical care.

## 2023-06-26 ENCOUNTER — Other Ambulatory Visit: Payer: Self-pay | Admitting: Physician Assistant

## 2023-06-30 ENCOUNTER — Encounter: Payer: Self-pay | Admitting: Family Medicine

## 2023-06-30 ENCOUNTER — Other Ambulatory Visit: Payer: Self-pay

## 2023-06-30 MED ORDER — HYDROCODONE-ACETAMINOPHEN 5-325 MG PO TABS: 1.0000 | ORAL_TABLET | Freq: Three times a day (TID) | ORAL | 0 refills | Status: DC | PRN

## 2023-06-30 MED ORDER — CYCLOBENZAPRINE HCL 10 MG PO TABS: ORAL_TABLET | ORAL | 0 refills | Status: DC

## 2023-07-06 ENCOUNTER — Other Ambulatory Visit: Payer: Self-pay

## 2023-07-06 ENCOUNTER — Encounter: Payer: Self-pay | Admitting: Physician Assistant

## 2023-07-06 ENCOUNTER — Ambulatory Visit: Attending: Physician Assistant | Admitting: Physician Assistant

## 2023-07-06 VITALS — BP 110/72 | HR 81 | Resp 16 | Ht 64.0 in | Wt 116.0 lb

## 2023-07-06 DIAGNOSIS — Z170 Estrogen receptor positive status [ER+]: Secondary | ICD-10-CM

## 2023-07-06 DIAGNOSIS — T451X5A Adverse effect of antineoplastic and immunosuppressive drugs, initial encounter: Secondary | ICD-10-CM

## 2023-07-06 DIAGNOSIS — Z79899 Other long term (current) drug therapy: Secondary | ICD-10-CM | POA: Diagnosis not present

## 2023-07-06 DIAGNOSIS — R768 Other specified abnormal immunological findings in serum: Secondary | ICD-10-CM | POA: Diagnosis not present

## 2023-07-06 DIAGNOSIS — M5030 Other cervical disc degeneration, unspecified cervical region: Secondary | ICD-10-CM

## 2023-07-06 DIAGNOSIS — Z981 Arthrodesis status: Secondary | ICD-10-CM | POA: Diagnosis not present

## 2023-07-06 DIAGNOSIS — C50312 Malignant neoplasm of lower-inner quadrant of left female breast: Secondary | ICD-10-CM

## 2023-07-06 DIAGNOSIS — L409 Psoriasis, unspecified: Secondary | ICD-10-CM

## 2023-07-06 DIAGNOSIS — M7061 Trochanteric bursitis, right hip: Secondary | ICD-10-CM

## 2023-07-06 DIAGNOSIS — D6481 Anemia due to antineoplastic chemotherapy: Secondary | ICD-10-CM

## 2023-07-06 DIAGNOSIS — M79641 Pain in right hand: Secondary | ICD-10-CM | POA: Diagnosis not present

## 2023-07-06 DIAGNOSIS — M25432 Effusion, left wrist: Secondary | ICD-10-CM | POA: Diagnosis not present

## 2023-07-06 DIAGNOSIS — I6522 Occlusion and stenosis of left carotid artery: Secondary | ICD-10-CM

## 2023-07-06 DIAGNOSIS — G620 Drug-induced polyneuropathy: Secondary | ICD-10-CM

## 2023-07-06 DIAGNOSIS — H15102 Unspecified episcleritis, left eye: Secondary | ICD-10-CM

## 2023-07-06 DIAGNOSIS — L4050 Arthropathic psoriasis, unspecified: Secondary | ICD-10-CM

## 2023-07-06 DIAGNOSIS — M47816 Spondylosis without myelopathy or radiculopathy, lumbar region: Secondary | ICD-10-CM

## 2023-07-06 DIAGNOSIS — M79642 Pain in left hand: Secondary | ICD-10-CM

## 2023-07-06 DIAGNOSIS — Z13820 Encounter for screening for osteoporosis: Secondary | ICD-10-CM

## 2023-07-06 DIAGNOSIS — M7062 Trochanteric bursitis, left hip: Secondary | ICD-10-CM

## 2023-07-06 DIAGNOSIS — Z79890 Hormone replacement therapy (postmenopausal): Secondary | ICD-10-CM

## 2023-07-06 DIAGNOSIS — E222 Syndrome of inappropriate secretion of antidiuretic hormone: Secondary | ICD-10-CM

## 2023-07-06 MED ORDER — BD TB SYRINGE 27G X 1/2" 1 ML MISC: 12.0000 | 3 refills | Status: AC

## 2023-07-06 MED ORDER — METHOTREXATE SODIUM CHEMO INJECTION 50 MG/2ML: 15.0000 mg | INTRAMUSCULAR | 0 refills | Status: AC

## 2023-07-06 NOTE — Progress Notes (Unsigned)
Please sign and review.

## 2023-07-13 ENCOUNTER — Other Ambulatory Visit: Payer: Self-pay | Admitting: Physician Assistant

## 2023-07-13 NOTE — Telephone Encounter (Signed)
 Last Fill: 03/18/2023  Labs: 05/18/2023 Sodium is low-133. Rest of CMP WNL   TB Gold: 10/19/2022 Neg    Next Visit: 10/06/2023  Last Visit: 07/06/2023  IK:Ednmpjupr arthropathy   Current Dose per office note 07/06/2023: Taltz  80 mg sq injections every 28 days   Okay to refill Taltz ?

## 2023-07-31 ENCOUNTER — Other Ambulatory Visit: Payer: Self-pay | Admitting: Physician Assistant

## 2023-08-01 ENCOUNTER — Encounter: Payer: Self-pay | Admitting: Family Medicine

## 2023-08-01 ENCOUNTER — Other Ambulatory Visit: Payer: Self-pay

## 2023-08-01 MED ORDER — HYDROCODONE-ACETAMINOPHEN 5-325 MG PO TABS
1.0000 | ORAL_TABLET | Freq: Three times a day (TID) | ORAL | 0 refills | Status: DC | PRN
Start: 2023-08-01 — End: 2023-08-25

## 2023-08-01 NOTE — Progress Notes (Unsigned)
 Office Visit Note  Patient: Kim Ortega             Date of Birth: 24-Feb-1955           MRN: [id number]             PCP: Duanne Butler DASEN, MD Referring: Duanne Butler DASEN, MD Visit Date: 08/09/2023 Occupation: @GUAROCC @  Subjective:  Flare   History of Present Illness: Kim Ortega is a 68 y.o. female with history of psoriatic arthritis.  Patient remains on Taltz  80 mg sq injections every 28 days--started 12/13/22, methotrexate  0.6 ml sq injections once weekly, and folic acid  2 mg daily.  CBC and CMP updated on 05/18/23.  Orders for CBC and CMP released today.   TB gold negative on 10/19/22.  Discussed the importance of holding taltz  if she develops signs or symptoms of and infection and to resume once the infection has completley cleared.    Activities of Daily Living:  Patient reports morning stiffness for *** {minute/hour:19697}.   Patient {ACTIONS;DENIES/REPORTS:21021675::Denies} nocturnal pain.  Difficulty dressing/grooming: {ACTIONS;DENIES/REPORTS:21021675::Denies} Difficulty climbing stairs: {ACTIONS;DENIES/REPORTS:21021675::Denies} Difficulty getting out of chair: {ACTIONS;DENIES/REPORTS:21021675::Denies} Difficulty using hands for taps, buttons, cutlery, and/or writing: {ACTIONS;DENIES/REPORTS:21021675::Denies}  No Rheumatology ROS completed.   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   Antineoplastic chemotherapy induced anemia 08/27/2015   Breast cancer of lower-inner quadrant of left female breast (HCC) 07/03/2015   Left carotid artery  stenosis    Psoriasis    Hormone replacement therapy    Smoker    Episcleritis     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 ,grade 1   COPD (chronic obstructive pulmonary disease) (HCC)    Episcleritis    Hormone replacement therapy    Hyperlipidemia    Hypertension    Left carotid artery stenosis    40-59% (01/2015)   Personal history of chemotherapy 2017   neoadjuvant   Personal history of radiation therapy 2018   Psoriasis    SIADH (syndrome of inappropriate ADH production) (HCC)    Smoker     Family History  Problem Relation Age of Onset   Stroke Mother    Colon polyps Sister    Colon cancer Brother    Esophageal cancer Neg Hx    Rectal cancer Neg Hx    Stomach cancer Neg Hx    Past Surgical History:  Procedure Laterality Date   ABDOMINAL HYSTERECTOMY     BREAST BIOPSY Left 06/2015   BREAST LUMPECTOMY Left 01/2016   BREAST LUMPECTOMY WITH RADIOACTIVE SEED AND SENTINEL LYMPH NODE BIOPSY Left 01/08/2016   Procedure: LEFT BREAST LUMPECTOMY WITH RADIOACTIVE SEED AND SENTINEL LYMPH NODE BIOPSY, BLUE DYE INJECTION;  Surgeon: Donnice Bury, MD;  Location: [facility];  Service: General;  Laterality: Left;   cervical acdf     C5-6- fusion with plates/screws Dr. Alix '12   COLONOSCOPY WITH PROPOFOL  N/A 04/16/2014   Procedure:  COLONOSCOPY WITH PROPOFOL ;  Surgeon: Gladis MARLA Louder, MD;  Location: [facility];  Service: Endoscopy;  Laterality: N/A;   COLPOSCOPY     laporotomy     Ovarian cyst   PORT-A-CATH REMOVAL Right 01/08/2016   Procedure: REMOVAL PORT-A-CATH;  Surgeon: Donnice Bury, MD;  Location: [facility];  Service: General;  Laterality: Right;   PORTACATH PLACEMENT N/A 07/24/2015   Procedure: INSERTION PORT-A-CATH WITH US ;  Surgeon: Donnice Bury, MD;  [facility];   Service: General;  Laterality: N/A;   ROBOTIC ASSISTED SALPINGO OOPHERECTOMY Bilateral 10/10/2018   Procedure: XI ROBOTIC ASSISTED SALPINGO OOPHORECTOMY;  Surgeon: Eloy Herring, MD;  Location: [facility];  Service: Gynecology;  Laterality: Bilateral;   TONSILLECTOMY     child   Social History   Social History Narrative   Married x 25 years.   Family lives nearby.   Immunization History  Administered Date(s) Administered   Influenza Split 11/04/2012   Influenza, Seasonal, Injecte, Preservative Fre 09/16/2022   Influenza,inj,Quad PF,6+ Mos 10/25/2016, 09/20/2018   Influenza-Unspecified 11/04/2013   MODERNA COVID-19 SARS-COV-2 PEDS BIVALENT BOOSTER 14yr-28yr 03/27/2019, 04/24/2019   Pneumococcal Polysaccharide-23 10/25/2016   Tdap 07/04/2009     Objective: Vital Signs: There were no vitals taken for this visit.   Physical Exam Vitals and nursing note reviewed.  Constitutional:      Appearance: She is well-developed.  HENT:     Head: Normocephalic and atraumatic.  Eyes:     Conjunctiva/sclera: Conjunctivae normal.  Cardiovascular:     Rate and Rhythm: Normal rate and regular rhythm.     Heart sounds: Normal heart sounds.  Pulmonary:     Effort: Pulmonary effort is normal.     Breath sounds: Normal breath sounds.  Abdominal:     General: Bowel sounds are normal.     Palpations: Abdomen is soft.  Musculoskeletal:     Cervical back: Normal range of motion.  Lymphadenopathy:     Cervical: No cervical adenopathy.  Skin:    General: Skin is warm and dry.     Capillary Refill: Capillary refill takes less than 2 seconds.  Neurological:     Mental Status: She is alert and oriented to person, place, and time.  Psychiatric:        Behavior: Behavior normal.      Musculoskeletal Exam: ***  CDAI Exam: CDAI Score: -- Patient Global: --; Provider Global: -- Swollen: --; Tender: -- Joint Exam 08/09/2023   No joint exam has been documented for this visit   There is currently no  information documented on the homunculus. Go to the Rheumatology activity and complete the homunculus joint exam.  Investigation: No additional findings.  Imaging: No results found.  Recent Labs: Lab Results  Component Value Date   WBC 9.0 05/18/2023   HGB 12.3 05/18/2023   PLT 335 05/18/2023   NA 133 (L) 05/18/2023   K 4.4 05/18/2023   CL 98 05/18/2023   CO2 29 05/18/2023   GLUCOSE 97 05/18/2023   BUN 12 05/18/2023   CREATININE 0.73 05/18/2023   BILITOT 0.5 05/18/2023   ALKPHOS 77 03/12/2019   AST 35 05/18/2023   ALT 20 05/18/2023   PROT 6.9 05/18/2023   ALBUMIN 4.2 03/12/2019   CALCIUM  9.7 05/18/2023   GFRAA 100 06/17/2020   QFTBGOLDPLUS NEGATIVE 10/19/2022    Speciality Comments: Tremfya stopped 11/15/2022, Taltz  started 12/13/2022  Procedures:  No procedures performed Allergies: Patient has no known allergies.   Assessment / Plan:  Visit Diagnoses: Psoriatic arthropathy (HCC)  Psoriasis  High risk medication use  Rheumatoid factor positive  Episcleritis of left eye  Swelling of left wrist  Pain in both hands  S/P cervical spinal fusion  DDD (degenerative disc disease), cervical  Spondylosis of lumbar spine  Trochanteric bursitis of both hips  Malignant neoplasm of lower-inner quadrant of left breast in female, estrogen receptor positive (HCC)  Antineoplastic chemotherapy induced anemia  Chemotherapy-induced peripheral neuropathy (HCC)  Left carotid artery stenosis  Hormone replacement therapy  SIADH (syndrome of inappropriate ADH production) (HCC)  Smoker  Orders: No orders of the defined types were placed in this encounter.  No orders of the defined types were placed in this encounter.   Face-to-face time spent with patient was *** minutes. Greater than 50% of time was spent in counseling and coordination of care.  Follow-Up Instructions: No follow-ups on file.   Waddell CHRISTELLA Craze, PA-C  Note - This record has been created  using Dragon software.  Chart creation errors have been sought, but may not always  have been located. Such creation errors do not reflect on  the standard of medical care.

## 2023-08-01 NOTE — Telephone Encounter (Signed)
 Telephone encounter has been created and provider has been tagged to this.

## 2023-08-01 NOTE — Telephone Encounter (Signed)
 Medication: Hydrocodone  Directions: Take 1 tablet by mouth every 8 (eight) hours as needed for moderate pain (pain score 4-6).  Last given: 06/30/2023 Number refills: 0 Last o/v: 03/25/2023 Follow up: NA Labs: 05/18/2023   Hydrocodone  has been pended with this encounter.

## 2023-08-09 ENCOUNTER — Ambulatory Visit: Attending: Physician Assistant | Admitting: Physician Assistant

## 2023-08-09 ENCOUNTER — Encounter: Payer: Self-pay | Admitting: Physician Assistant

## 2023-08-09 VITALS — BP 128/81 | HR 79 | Resp 14 | Ht 64.0 in | Wt 116.0 lb

## 2023-08-09 DIAGNOSIS — M5030 Other cervical disc degeneration, unspecified cervical region: Secondary | ICD-10-CM

## 2023-08-09 DIAGNOSIS — F17200 Nicotine dependence, unspecified, uncomplicated: Secondary | ICD-10-CM

## 2023-08-09 DIAGNOSIS — D6481 Anemia due to antineoplastic chemotherapy: Secondary | ICD-10-CM

## 2023-08-09 DIAGNOSIS — Z981 Arthrodesis status: Secondary | ICD-10-CM

## 2023-08-09 DIAGNOSIS — M79641 Pain in right hand: Secondary | ICD-10-CM | POA: Diagnosis not present

## 2023-08-09 DIAGNOSIS — L4050 Arthropathic psoriasis, unspecified: Secondary | ICD-10-CM | POA: Diagnosis not present

## 2023-08-09 DIAGNOSIS — Z13820 Encounter for screening for osteoporosis: Secondary | ICD-10-CM

## 2023-08-09 DIAGNOSIS — M79642 Pain in left hand: Secondary | ICD-10-CM

## 2023-08-09 DIAGNOSIS — L409 Psoriasis, unspecified: Secondary | ICD-10-CM

## 2023-08-09 DIAGNOSIS — H15102 Unspecified episcleritis, left eye: Secondary | ICD-10-CM

## 2023-08-09 DIAGNOSIS — Z170 Estrogen receptor positive status [ER+]: Secondary | ICD-10-CM

## 2023-08-09 DIAGNOSIS — M25432 Effusion, left wrist: Secondary | ICD-10-CM

## 2023-08-09 DIAGNOSIS — T451X5A Adverse effect of antineoplastic and immunosuppressive drugs, initial encounter: Secondary | ICD-10-CM

## 2023-08-09 DIAGNOSIS — G620 Drug-induced polyneuropathy: Secondary | ICD-10-CM

## 2023-08-09 DIAGNOSIS — C50312 Malignant neoplasm of lower-inner quadrant of left female breast: Secondary | ICD-10-CM

## 2023-08-09 DIAGNOSIS — Z79899 Other long term (current) drug therapy: Secondary | ICD-10-CM | POA: Diagnosis not present

## 2023-08-09 DIAGNOSIS — Z79890 Hormone replacement therapy (postmenopausal): Secondary | ICD-10-CM

## 2023-08-09 DIAGNOSIS — R768 Other specified abnormal immunological findings in serum: Secondary | ICD-10-CM | POA: Diagnosis not present

## 2023-08-09 DIAGNOSIS — M7061 Trochanteric bursitis, right hip: Secondary | ICD-10-CM

## 2023-08-09 DIAGNOSIS — I6522 Occlusion and stenosis of left carotid artery: Secondary | ICD-10-CM

## 2023-08-09 DIAGNOSIS — M47816 Spondylosis without myelopathy or radiculopathy, lumbar region: Secondary | ICD-10-CM

## 2023-08-09 DIAGNOSIS — M7062 Trochanteric bursitis, left hip: Secondary | ICD-10-CM

## 2023-08-09 DIAGNOSIS — E222 Syndrome of inappropriate secretion of antidiuretic hormone: Secondary | ICD-10-CM

## 2023-08-09 MED ORDER — METHYLPREDNISOLONE ACETATE 40 MG/ML IJ SUSP
80.0000 mg | Freq: Once | INTRAMUSCULAR | Status: AC
  Administered 2023-08-09: 80 mg via INTRAMUSCULAR

## 2023-08-09 NOTE — Patient Instructions (Signed)
 PLEASE BRING INCOME DOCUMENTS FOR YOUR COSENTYX APPLICATION   Standing Labs We placed an order today for your standing lab work.   Please have your standing labs drawn in 1 month every 3 months  Please have your labs drawn 2 weeks prior to your appointment so that the provider can discuss your lab results at your appointment, if possible.  Please note that you may see your imaging and lab results in MyChart before we have reviewed them. We will contact you once all results are reviewed. Please allow our office up to 72 hours to thoroughly review all of the results before contacting the office for clarification of your results.  WALK-IN LAB HOURS  Monday through Thursday from 8:00 am -12:30 pm and 1:00 pm-4:30 pm and Friday from 8:00 am-12:00 pm.  Patients with office visits requiring labs will be seen before walk-in labs.  You may encounter longer than normal wait times. Please allow additional time. Wait times may be shorter on  Monday and Thursday afternoons.  We do not book appointments for walk-in labs. We appreciate your patience and understanding with our staff.   Labs are drawn by [facility]. Please bring your co-pay at the time of your lab draw.  You may receive a bill from [facility] for your lab work.  Please note if you are on Hydroxychloroquine and and an order has been placed for a Hydroxychloroquine level,  you will need to have it drawn 4 hours or more after your last dose.  If you wish to have your labs drawn at another location, please call the office 24 hours in advance so we can fax the orders.  The office is located at [address], [address], [place], [place] [address]   If you have any questions regarding directions or hours of operation,  please call [phone].   As a reminder, please drink plenty of water prior to coming for your lab work. Thanks!   Secukinumab Injection What is this medication? SECUKINUMAB (sek ue KIN ue mab) treats autoimmune conditions, such  as arthritis and psoriasis. It works by slowing down an overactive immune system.  It may also be used to treat hidradenitis suppurativa (HS). HS is a condition that causes painful lumps under the skin in areas such as the armpits and groin. It is a monoclonal antibody. This medicine may be used for other purposes; ask your health care provider or pharmacist if you have questions. COMMON BRAND NAME(S): Cosentyx What should I tell my care team before I take this medication? They need to know if you have any of these conditions: Crohn's disease, ulcerative colitis, or other inflammatory bowel disease Immune system problems Infection or history of infection, such as a viral infection, chickenpox, cold sores, or herpes Recently received or are scheduled to receive a vaccine Tuberculosis, a positive skin test for tuberculosis, or recent close contact with someone who has tuberculosis An unusual or allergic reaction to secukinumab, latex, rubber, other medications, foods, dyes, or preservatives Pregnant or trying to get pregnant Breast-feeding How should I use this medication? This medication is injected into a vein or under the skin. It is given by your care team in a hospital or clinic setting if it is injected into a vein. If it is injected under the skin, it may be given at home. If you get this medication at home, you will be taught how to prepare and give it. Use it exactly as directed. Take it as directed on the prescription label. Keep  taking it unless your care team tells you to stop. It is important that you put your used needles and syringes in a special sharps container. Do not put them in a trash can. If you do not have a sharps container, call your pharmacist or care team to get one. A special MedGuide will be given to you by the pharmacist with each prescription and refill. Be sure to read this information carefully each time. Talk to your care team about the use of this medication in  children. While it may be prescribed for children as young as 2 years for selected conditions, precautions do apply. Overdosage: If you think you have taken too much of this medicine contact a poison control center or emergency room at once. NOTE: This medicine is only for you. Do not share this medicine with others. What if I miss a dose? If you get this medication at the hospital or clinic: It is important not to miss your dose. Call your care team if you are unable to keep an appointment. If you give yourself this medication at home: If you miss a dose, take it as soon as you can. If it is almost time for your next dose, take only that dose. Do not take double or extra doses. Call your care team with questions. What may interact with this medication? Live virus vaccines This list may not describe all possible interactions. Give your health care provider a list of all the medicines, herbs, non-prescription drugs, or dietary supplements you use. Also tell them if you smoke, drink alcohol, or use illegal drugs. Some items may interact with your medicine. What should I watch for while using this medication? Visit your care team for regular checks on your progress. Tell your care team if your symptoms do not start to get better or if they get worse. You will be tested for tuberculosis (TB) before you start this medication. If your care team prescribes any medication for TB, you should start taking the TB medication before starting this medication. Make sure to finish the full course of TB medication. This medication may increase your risk of getting an infection. Call your care team for advice if you get a fever, chills, sore throat, or other symptoms of a cold or flu. Do not treat yourself. Try to avoid being around people who are sick. This medication can decrease the response to a vaccine. If you need to get vaccinated, tell your care team if you have received this medication within the last 6 months.  Extra booster doses may be needed. Talk to your care team to see if a different vaccination schedule is needed. What side effects may I notice from receiving this medication? Side effects that you should report to your care team as soon as possible: Allergic reactions--skin rash, itching, hives, swelling of the face, lips, tongue, or throat Dry, itchy, scaly patches of skin that blister or peel Infection--fever, chills, cough, sore throat, wounds that don't heal, pain or trouble when passing urine, general feeling of discomfort or being unwell Sudden or severe stomach pain, bloody diarrhea, fever, nausea, vomiting Side effects that usually do not require medical attention (report these to your care team if they continue or are bothersome): Diarrhea Runny or stuffy nose Sore throat This list may not describe all possible side effects. Call your doctor for medical advice about side effects. You may report side effects to FDA at 1-800-FDA-1088. Where should I keep my medication? Keep out of the  reach of children and pets. Store in the refrigerator. Do not freeze. Keep it in the original carton until you are ready to use it. Protect from light. Do not shake. Remove the dose from the refrigerator about 30 minutes before it is time for you to use it. Use it within 4 days of removing it from the carton. Get rid of any unused medication after the expiration date. To get rid of medications that are no longer needed or have expired: Take the medication to a medication take-back program. Check with your pharmacy or law enforcement to find a location. If you cannot return the medication, ask your pharmacist or care team how to get rid of this medication safely. NOTE: This sheet is a summary. It may not cover all possible information. If you have questions about this medicine, talk to your doctor, pharmacist, or health care provider.  2024 Elsevier/Gold Standard (2022-12-03 00:00:00)

## 2023-08-09 NOTE — Progress Notes (Signed)
 Per Waddell Craze, PA-C patient given Depo-Medrol  80 mg IM. Patient tolerated injection well.   Administrations This Visit     methylPREDNISolone  acetate (DEPO-MEDROL ) injection 80 mg     Admin Date 08/09/2023 Action Given Dose 80 mg Route Intramuscular Documented By Cena Alfonso CROME, LPN

## 2023-08-09 NOTE — Progress Notes (Signed)
 Pharmacy Note  Subjective:  Patient presents today to [facility] for follow up office visit.   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 Her last Taltz  dose was 08/01/2023  Taltz  started December 2024. Switched from Tremfya due to waning response. Continues methotrexate  subcut injections  History of inflammatory bowel disease: No  Objective:  CBC    Component Value Date/Time   WBC 9.0 05/18/2023 0834   RBC 3.39 (L) 05/18/2023 0834   HGB 12.3 05/18/2023 0834   HGB 12.4 03/12/2019 1353   HGB 12.7 05/20/2016 1052   HCT 37.5 05/18/2023 0834   HCT 36.4 05/20/2016 1052   PLT 335 05/18/2023 0834   PLT 251 03/12/2019 1353   PLT 248 05/20/2016 1052   MCV 110.6 (H) 05/18/2023 0834   MCV 102.8 (H) 05/20/2016 1052   MCH 36.3 (H) 05/18/2023 0834   MCHC 32.8 05/18/2023 0834   RDW 12.9 05/18/2023 0834   RDW 13.2 05/20/2016 1052   LYMPHSABS 1,778 09/28/2022 1016   LYMPHSABS 1.3 05/20/2016 1052   MONOABS 0.9 03/12/2019 1353   MONOABS 0.6 05/20/2016 1052   EOSABS 90 05/18/2023 0834   EOSABS 0.1 05/20/2016 1052   BASOSABS 36 05/18/2023 0834   BASOSABS 0.0 05/20/2016 1052    CMP     Component Value Date/Time   NA 133 (L) 05/18/2023 0834   NA 141 05/20/2016 1052   K 4.4 05/18/2023 0834   K 3.8 05/20/2016 1052   CL 98 05/18/2023 0834   CO2 29 05/18/2023 0834   CO2 28 05/20/2016 1052   GLUCOSE 97 05/18/2023 0834   GLUCOSE 100 05/20/2016 1052   BUN 12 05/18/2023 0834   BUN 14.4 05/20/2016 1052   CREATININE 0.73 05/18/2023 0834   CREATININE 0.8 05/20/2016 1052   CALCIUM  9.7 05/18/2023 0834   CALCIUM  10.0 05/20/2016 1052   PROT 6.9 05/18/2023 0834   PROT 7.2 05/20/2016 1052   ALBUMIN 4.2 03/12/2019 1353   ALBUMIN 4.3 05/20/2016 1052   AST 35 05/18/2023 0834   AST 24 03/12/2019 1353   AST 26 05/20/2016 1052   ALT 20 05/18/2023 0834   ALT 27 03/12/2019 1353   ALT 29 05/20/2016 1052   ALKPHOS 77  03/12/2019 1353   ALKPHOS 69 05/20/2016 1052   BILITOT 0.5 05/18/2023 0834   BILITOT 0.4 03/12/2019 1353   BILITOT 0.56 05/20/2016 1052   GFRNONAA 86 06/17/2020 1045   GFRAA 100 06/17/2020 1045    Baseline Immunosuppressant Therapy Labs TB GOLD    Latest Ref Rng & Units 10/19/2022   10:08 AM  Quantiferon TB Gold  Quantiferon TB Gold Plus NEGATIVE NEGATIVE    Hepatitis Panel    Latest Ref Rng & Units 10/19/2022   10:08 AM  Hepatitis  Hep B Surface Ag NON-REACTIVE NON-REACTIVE   Hep B IgM NON-REACTIVE NON-REACTIVE   Hep C Ab NON-REACTIVE NON-REACTIVE    Immunoglobulins    Latest Ref Rng & Units 10/19/2022   10:08 AM  Immunoglobulin Electrophoresis  IgA  70 - 320 mg/dL 756   IgG 399 - 8,459 mg/dL 045   IgM 50 - 699 mg/dL 864    SPEP    Latest Ref Rng & Units 05/18/2023    8:34 AM  Serum Protein Electrophoresis  Total Protein 6.1 - 8.1 g/dL 6.9    Chest x-ray: COPD without acute abnormality.  Assessment/Plan:  Counseled patient that Cosentyx is a IL-17 inhibitor.  Counseled patient on purpose,  proper use, and adverse effects of Cosentyx. Reviewed the most common adverse effects of infection (more commonly nasopharyngitis, URTI), inflammatory bowel disease, and allergic reaction.  Counseled patient that Cosentyx should be held prior to scheduled surgery.  Counseled patient to avoid live vaccines while on Cosentyx.  Recommend annual influenza, PCV 15 or PCV20 or Pneumovax 23, and Shingrix as indicated.  Reviewed the importance of regular labs while on Cosentyx.  Will monitor CBC and CMP 1 month after starting and every 3 months routinely thereafter. Will monitor TB gold annually.  Standing orders placed.  Provided patient with medication education material and answered all questions.  Patient consented to Cosentyx.  Will upload consent into patient's chart.  Will apply for Cosentyx through patient's insurance.  Reviewed storage information for Cosentyx.  Advised initial  injection must be administered in office.  Patient voiced understanding.    Patient dose will be for plaque psoriasis +/- psoriatic arthritis 300 mg every 7 days for 5 weeks then 300 mg every 28 days.  Prescription will be sent to pharmacy pending lab results and insurance approval. Patient signed Cosentyx PAP application. Will bring income documents at a later point. Provider sig on PAP application pending  Ralf Konopka, PharmD, MPH, BCPS, CPP Clinical Pharmacist (Rheumatology and Pulmonology)

## 2023-08-10 ENCOUNTER — Telehealth: Payer: Self-pay

## 2023-08-10 ENCOUNTER — Other Ambulatory Visit (HOSPITAL_COMMUNITY): Payer: Self-pay

## 2023-08-10 ENCOUNTER — Other Ambulatory Visit: Payer: Self-pay | Admitting: Hematology and Oncology

## 2023-08-10 DIAGNOSIS — Z1231 Encounter for screening mammogram for malignant neoplasm of breast: Secondary | ICD-10-CM

## 2023-08-10 NOTE — Telephone Encounter (Addendum)
 Submitted a Prior Authorization request to [employer] for COSENTYX SQ via CoverMyMeds. Will update once we receive a response.  Key: [id number]   Per automated response, Authorization already on file for this request. Test claim shows copay of $100 for 28 day supply  [employer] - they will fax approval letter. Approved from 08/10/2023 through 01/04/2024 Auth # [id number] Case # [id number]  Sherry Pennant, PharmD, MPH, BCPS, CPP Clinical Pharmacist (Rheumatology and Pulmonology)  ----- Message from Sherry GORMAN Pennant sent at 08/09/2023  9:02 AM EDT ----- Please start Cosentyx SQ BIV Dose: 300mg  subcut at Weeks 0, 1, 2, 3, 4 then every 4 weeks thereafter  Prescription will be sent to pharmacy pending lab results and insurance approval. Patient signed Cosentyx PAP application. Will bring income documents at a later point. Provider sig on PAP application pending

## 2023-08-11 NOTE — Telephone Encounter (Signed)
 Cosentyx PA approval received from [facility]. Scanned to media tab

## 2023-08-25 ENCOUNTER — Encounter: Payer: Self-pay | Admitting: Family Medicine

## 2023-08-25 ENCOUNTER — Other Ambulatory Visit: Payer: Self-pay | Admitting: Family Medicine

## 2023-08-25 MED ORDER — HYDROCODONE-ACETAMINOPHEN 5-325 MG PO TABS: 1.0000 | ORAL_TABLET | Freq: Three times a day (TID) | ORAL | 0 refills | Status: DC | PRN

## 2023-08-25 NOTE — Telephone Encounter (Signed)
 Spoke with patient - she is going to drop off income documents Friday morning. She is aware that this may have to be handled on Monday once back onsite  Sherry Pennant, PharmD, MPH, BCPS, CPP Clinical Pharmacist (Rheumatology and Pulmonology)

## 2023-08-26 ENCOUNTER — Other Ambulatory Visit: Payer: Self-pay | Admitting: *Deleted

## 2023-08-26 DIAGNOSIS — Z79899 Other long term (current) drug therapy: Secondary | ICD-10-CM

## 2023-08-27 LAB — COMPREHENSIVE METABOLIC PANEL WITH GFR
AG Ratio: 1.7 (calc) (ref 1.0–2.5)
ALT: 62 U/L — ABNORMAL HIGH (ref 6–29)
AST: 47 U/L — ABNORMAL HIGH (ref 10–35)
Albumin: 4.4 g/dL (ref 3.6–5.1)
Alkaline phosphatase (APISO): 133 U/L (ref 37–153)
BUN: 11 mg/dL (ref 7–25)
CO2: 27 mmol/L (ref 20–32)
Calcium: 10 mg/dL (ref 8.6–10.4)
Chloride: 97 mmol/L — ABNORMAL LOW (ref 98–110)
Creat: 0.64 mg/dL (ref 0.50–1.05)
Globulin: 2.6 g/dL (ref 1.9–3.7)
Glucose, Bld: 97 mg/dL (ref 65–99)
Potassium: 4.2 mmol/L (ref 3.5–5.3)
Sodium: 134 mmol/L — ABNORMAL LOW (ref 135–146)
Total Bilirubin: 0.3 mg/dL (ref 0.2–1.2)
Total Protein: 7 g/dL (ref 6.1–8.1)
eGFR: 96 mL/min/1.73m2 (ref >=60)

## 2023-08-27 LAB — CBC WITH DIFFERENTIAL/PLATELET
Absolute Lymphocytes: 1864 {cells}/uL (ref 850–3900)
Absolute Monocytes: 774 {cells}/uL (ref 200–950)
Basophils Absolute: 40 {cells}/uL (ref 0–200)
Basophils Relative: 0.5 %
Eosinophils Absolute: 79 {cells}/uL (ref 15–500)
Eosinophils Relative: 1 %
HCT: 35.2 % (ref 35.0–45.0)
Hemoglobin: 12 g/dL (ref 11.7–15.5)
MCH: 36.9 pg — ABNORMAL HIGH (ref 27.0–33.0)
MCHC: 34.1 g/dL (ref 32.0–36.0)
MCV: 108.3 fL — ABNORMAL HIGH (ref 80.0–100.0)
MPV: 9.6 fL (ref 7.5–12.5)
Monocytes Relative: 9.8 %
Neutro Abs: 5143 {cells}/uL (ref 1500–7800)
Neutrophils Relative %: 65.1 %
Platelets: 310 Thousand/uL (ref 140–400)
RBC: 3.25 Million/uL — ABNORMAL LOW (ref 3.80–5.10)
RDW: 12.5 % (ref 11.0–15.0)
Total Lymphocyte: 23.6 %
WBC: 7.9 Thousand/uL (ref 3.8–10.8)

## 2023-08-29 ENCOUNTER — Ambulatory Visit: Payer: Self-pay | Admitting: Physician Assistant

## 2023-08-29 NOTE — Progress Notes (Signed)
 AST and ALT are elevated--please clarify the dose of methotrexate  she is currently taking?  Please clarify if she has been taking any Tylenol ?  Alcohol use?  Or any other medication changes? Blood blood cell count remains low and continues to trend down.  MCV and MCH remain elevated--please clarify the dose of folic acid  she is currently taking? Rest of CBC within normal limits.

## 2023-08-29 NOTE — Telephone Encounter (Signed)
 Received income documents  Submitted Patient Assistance Application to [employer] for COSENTYX SQ along with provider portion, patient portion, PA, medication list, insurance card copy and income documents. Will update patient when we receive a response.  Phone: [phone] Fax: [phone]  Sherry Pennant, PharmD, MPH, BCPS, CPP Clinical Pharmacist (Rheumatology and Pulmonology)

## 2023-08-29 NOTE — Progress Notes (Signed)
 Please advise patient to avoid any alcohol use and to have hepatic function panel rechecked in 2 weeks.  If her LFTs remain elevated she will need to schedule a visit to discuss treatment alternatives.

## 2023-09-02 ENCOUNTER — Other Ambulatory Visit: Payer: Self-pay | Admitting: Family Medicine

## 2023-09-02 NOTE — Telephone Encounter (Signed)
 Received fax from [employer] for COSENTYX SQ patient assistance, patient's application has been DENIED due to household income exceeding financial criteria for program.    Phone: [phone]  Sherry Pennant, PharmD, MPH, BCPS, CPP Clinical Pharmacist (Rheumatology and Pulmonology)

## 2023-09-06 NOTE — Telephone Encounter (Signed)
 D/w patient. She can afford the $100 copay per month but wanted to see if giving Taltz  more time would allow it to be more effective. Advised that she has been on treatment for 9 months and she would not have increased efficacy from this point onward. She reports ongoing joint pains.   She ultimately decided to trial Cosentyx and will pay copay.  Scheduled for new start on 09/14/2023. Will use sample. Denies active infection or upcoming surgeries.  Sherry Pennant, PharmD, MPH, BCPS, CPP Clinical Pharmacist (Rheumatology and Pulmonology)

## 2023-09-07 NOTE — Progress Notes (Signed)
 Pharmacy Note  Subjective:   Patient presents to clinic today to receive first dose of COSENTYX  SQ for Psoriatic arthritis and plaque psoriasis. Patient currently takes Taltz  and previously failed Tremfya.  Patient running a fever or have signs/symptoms of infection? No  Patient currently on antibiotics for the treatment of infection? No  Patient have any upcoming invasive procedures/surgeries? No  Objective: CMP     Component Value Date/Time   NA 134 (L) 08/26/2023 0949   NA 141 05/20/2016 1052   K 4.2 08/26/2023 0949   K 3.8 05/20/2016 1052   CL 97 (L) 08/26/2023 0949   CO2 27 08/26/2023 0949   CO2 28 05/20/2016 1052   GLUCOSE 97 08/26/2023 0949   GLUCOSE 100 05/20/2016 1052   BUN 11 08/26/2023 0949   BUN 14.4 05/20/2016 1052   CREATININE 0.64 08/26/2023 0949   CREATININE 0.8 05/20/2016 1052   CALCIUM  10.0 08/26/2023 0949   CALCIUM  10.0 05/20/2016 1052   PROT 7.0 08/26/2023 0949   PROT 7.2 05/20/2016 1052   ALBUMIN 4.2 03/12/2019 1353   ALBUMIN 4.3 05/20/2016 1052   AST 47 (H) 08/26/2023 0949   AST 24 03/12/2019 1353   AST 26 05/20/2016 1052   ALT 62 (H) 08/26/2023 0949   ALT 27 03/12/2019 1353   ALT 29 05/20/2016 1052   ALKPHOS 77 03/12/2019 1353   ALKPHOS 69 05/20/2016 1052   BILITOT 0.3 08/26/2023 0949   BILITOT 0.4 03/12/2019 1353   BILITOT 0.56 05/20/2016 1052   GFRNONAA 86 06/17/2020 1045   GFRAA 100 06/17/2020 1045    CBC    Component Value Date/Time   WBC 7.9 08/26/2023 0949   RBC 3.25 (L) 08/26/2023 0949   HGB 12.0 08/26/2023 0949   HGB 12.4 03/12/2019 1353   HGB 12.7 05/20/2016 1052   HCT 35.2 08/26/2023 0949   HCT 36.4 05/20/2016 1052   PLT 310 08/26/2023 0949   PLT 251 03/12/2019 1353   PLT 248 05/20/2016 1052   MCV 108.3 (H) 08/26/2023 0949   MCV 102.8 (H) 05/20/2016 1052   MCH 36.9 (H) 08/26/2023 0949   MCHC 34.1 08/26/2023 0949   RDW 12.5 08/26/2023 0949   RDW 13.2 05/20/2016 1052   LYMPHSABS 1,778 09/28/2022 1016   LYMPHSABS 1.3  05/20/2016 1052   MONOABS 0.9 03/12/2019 1353   MONOABS 0.6 05/20/2016 1052   EOSABS 79 08/26/2023 0949   EOSABS 0.1 05/20/2016 1052   BASOSABS 40 08/26/2023 0949   BASOSABS 0.0 05/20/2016 1052    Baseline Immunosuppressant Therapy Labs TB GOLD    Latest Ref Rng & Units 10/19/2022   10:08 AM  Quantiferon TB Gold  Quantiferon TB Gold Plus NEGATIVE NEGATIVE    Hepatitis Panel    Latest Ref Rng & Units 10/19/2022   10:08 AM  Hepatitis  Hep B Surface Ag NON-REACTIVE NON-REACTIVE   Hep B IgM NON-REACTIVE NON-REACTIVE   Hep C Ab NON-REACTIVE NON-REACTIVE    HIV No results found for: HIV Immunoglobulins    Latest Ref Rng & Units 10/19/2022   10:08 AM  Immunoglobulin Electrophoresis  IgA  70 - 320 mg/dL 756   IgG 399 - 8,459 mg/dL 045   IgM 50 - 699 mg/dL 864    SPEP    Latest Ref Rng & Units 08/26/2023    9:49 AM  Serum Protein Electrophoresis  Total Protein 6.1 - 8.1 g/dL 7.0    H3EI No results found for: G6PDH TPMT No results found for: TPMT   Chest x-ray:  Yes 08/27/2019. COPD without acute abnormality   Assessment/Plan:  Reviewed importance of holding COSENTYX  SQ with signs/symptoms of an infections, if antibiotics are prescribed to treat an active infection, and with invasive procedures  Demonstrated proper injection technique with Cozentyx demo device  Patient able to demonstrate proper injection technique using the teach back method.  Patient self injected in the right upper thigh with:  Sample Medication: Cosentyx  UnoReady 300 mg/2mL Pen NDC: 9921-8929-03 Lot: [id number] Expiration: 2026-DEC-31  Patient tolerated well.  Observed for 30 mins in office for adverse reaction. Patient denies itchiness and irritation at injection., No swelling or redness noted., and Reviewed injection site reaction management with patient verbally and printed information for review in AVS.   Patient is to return in 1 month for labs and 6-8 weeks for follow-up appointment.   Standing orders for CBC/CMP placed.  TB gold will be monitored yearly.   COSENTYX  SQ approved through insurance .   Rx sent to: [facility]: [phone] .  Patient provided with pharmacy phone number and advised to call later this week to schedule shipment to home.  Patient will continue COSENTYX  SQ 300 mg every 7 days for 5 weeks (week 0 dose completed in office today) then 300 mg every 28 days in combination with Methotrexate  0.6 mL subcut injections once weekly and folic acid  2 mg daily. CMP repeated today, if LFTs are still elevated will likely discontinue methotrexate .  All questions encouraged and answered.  Instructed patient to call with any further questions or concerns.  Sherry Pennant, PharmD, MPH, BCPS, CPP Clinical Pharmacist (Rheumatology and Pulmonology)  09/07/2023 2:24 PM

## 2023-09-12 ENCOUNTER — Other Ambulatory Visit: Payer: Self-pay | Admitting: Rheumatology

## 2023-09-12 NOTE — Telephone Encounter (Signed)
 Last Fill: 10/21/2022  Next Visit: 10/06/2023  Last Visit: 08/09/2023  Dx: Psoriatic arthropathy   Current Dose per office note on 08/09/2023: folic acid  2 mg daily   Okay to refill Folic Acid ?

## 2023-09-14 ENCOUNTER — Other Ambulatory Visit (HOSPITAL_COMMUNITY): Payer: Self-pay

## 2023-09-14 ENCOUNTER — Other Ambulatory Visit: Payer: Self-pay

## 2023-09-14 ENCOUNTER — Ambulatory Visit
Admission: RE | Admit: 2023-09-14 | Discharge: 2023-09-14 | Disposition: A | Discharge status: Home / Self Care | Source: Ambulatory Visit | Attending: Hematology and Oncology

## 2023-09-14 ENCOUNTER — Ambulatory Visit: Attending: Rheumatology | Admitting: Pharmacist

## 2023-09-14 DIAGNOSIS — Z5181 Encounter for therapeutic drug level monitoring: Secondary | ICD-10-CM | POA: Diagnosis not present

## 2023-09-14 DIAGNOSIS — L4050 Arthropathic psoriasis, unspecified: Secondary | ICD-10-CM | POA: Diagnosis not present

## 2023-09-14 DIAGNOSIS — Z1231 Encounter for screening mammogram for malignant neoplasm of breast: Secondary | ICD-10-CM | POA: Diagnosis not present

## 2023-09-14 DIAGNOSIS — Z79899 Other long term (current) drug therapy: Secondary | ICD-10-CM | POA: Diagnosis not present

## 2023-09-14 DIAGNOSIS — Z7189 Other specified counseling: Secondary | ICD-10-CM | POA: Diagnosis not present

## 2023-09-14 DIAGNOSIS — L409 Psoriasis, unspecified: Secondary | ICD-10-CM | POA: Diagnosis not present

## 2023-09-14 LAB — COMPREHENSIVE METABOLIC PANEL WITH GFR
AG Ratio: 1.3 (calc) (ref 1.0–2.5)
ALT: 30 U/L — ABNORMAL HIGH (ref 6–29)
AST: 43 U/L — ABNORMAL HIGH (ref 10–35)
Albumin: 3.8 g/dL (ref 3.6–5.1)
Alkaline phosphatase (APISO): 102 U/L (ref 37–153)
BUN: 9 mg/dL (ref 7–25)
CO2: 24 mmol/L (ref 20–32)
Calcium: 9.4 mg/dL (ref 8.6–10.4)
Chloride: 100 mmol/L (ref 98–110)
Creat: 0.68 mg/dL (ref 0.50–1.05)
Globulin: 3 g/dL (ref 1.9–3.7)
Glucose, Bld: 83 mg/dL (ref 65–99)
Potassium: 4.6 mmol/L (ref 3.5–5.3)
Sodium: 134 mmol/L — ABNORMAL LOW (ref 135–146)
Total Bilirubin: 0.3 mg/dL (ref 0.2–1.2)
Total Protein: 6.8 g/dL (ref 6.1–8.1)
eGFR: 95 mL/min/1.73m2 (ref >=60)

## 2023-09-14 MED ORDER — COSENTYX UNOREADY 300 MG/2ML ~~LOC~~ SOAJ
300.0000 mg | SUBCUTANEOUS | 0 refills | Status: DC
  Filled 2023-09-14: qty 6, 21d supply, fill #0

## 2023-09-14 MED ORDER — COSENTYX UNOREADY 300 MG/2ML ~~LOC~~ SOAJ
300.0000 mg | SUBCUTANEOUS | 1 refills | Status: DC
  Filled 2023-09-14 – 2023-10-05 (×2): qty 2, 28d supply, fill #0
  Filled 2023-11-02: qty 2, 28d supply, fill #1

## 2023-09-14 NOTE — Progress Notes (Signed)
 Specialty Pharmacy Initial Fill Coordination Note  Kim Ortega is a 68 y.o. female contacted today regarding initial fill of specialty medication(s) Secukinumab  (Cosentyx  UnoReady)   Patient requested Delivery   Delivery date: 09/20/23   Verified address: [address]   [place] [address]   Medication will be filled on 09/19/23.   Patient is aware of $100 copayment. Credit card information collected and forwarded to Cam at [facility].  NOTE: first fill is a 21 days supply.

## 2023-09-14 NOTE — Progress Notes (Signed)
 I discussed / reviewed the pharmacy note by Andriette Cleaves, PharmD Candidate, and I agree with the findings and plans as documented. All appropriate orders and prescriptions have been placed.  Supervising provider: Dolphus Reiter, MD  Sherry Pennant, PharmD, MPH, BCPS, CPP Clinical Pharmacist [facility])

## 2023-09-14 NOTE — Patient Instructions (Addendum)
 Your next COSENTYX  SQ dose is due on 09/21/23, 09/28/23, 10/05/23, 10/12/23, then every 4 weeks thereafter (starting on 11/09/23)  We will call with lab results. Plan to continue methotrexate  until we tell you otherwise.  HOLD COSENTYX  SQ and METHOTREXATE  if you have signs or symptoms of an infection. You can resume once you feel better or back to your baseline. HOLD COSENTYX  SQ and METHOTREXATE  if you start antibiotics to treat an infection. HOLD COSENTYX  SQ and METHOTREXATE  around the time of surgery/procedures. Your surgeon will be able to provide recommendations on when to hold BEFORE and when you are cleared to RESUME.  Pharmacy information: Your prescription will be shipped from [facility]. Their phone number is [phone] [facility] or [facility] will call to schedule the first shipment and confirm address. They will mail your medication to your home.  Cost information: Your copay will be $100 per 4 week supply through your insurance  Labs are due in 1 month then every 3 months. Lab hours are from Monday to Thursday 8am-12:30pm and 1pm-4pm and Friday 8am-12pm. You do not need an appointment if you come for labs during these times. If you'd like to go to a [facility] or [facility] closer to home, please call our clinic 48 hours prior to lab date so we can release orders in a timely manner.  Stay up to date on all routine vaccines: influenza, pneumonia, COVID19, Shingles  How to manage an injection site reaction: Remember the 5 C's: COUNTER - leave on the counter at least 30 minutes but up to overnight to bring medication to room temperature. This may help prevent stinging COLD - place something cold (like an ice gel pack or cold water bottle) on the injection site just before cleansing with alcohol. This may help reduce pain CLARITIN - use Claritin (generic name is loratadine) for the first two weeks of treatment or the day of, the day before, and the day after injecting. This  will help to minimize injection site reactions CORTISONE CREAM - apply if injection site is irritated and itching CALL ME - if injection site reaction is bigger than the size of your fist, looks infected, blisters, or if you develop hives

## 2023-09-15 ENCOUNTER — Ambulatory Visit: Payer: Self-pay | Admitting: Rheumatology

## 2023-09-15 DIAGNOSIS — H353132 Nonexudative age-related macular degeneration, bilateral, intermediate dry stage: Secondary | ICD-10-CM | POA: Diagnosis not present

## 2023-09-15 DIAGNOSIS — H2513 Age-related nuclear cataract, bilateral: Secondary | ICD-10-CM | POA: Diagnosis not present

## 2023-09-15 NOTE — Progress Notes (Signed)
 Sodium is low and stable.  LFTs remain elevated and improved most likely due to statin use.

## 2023-09-16 ENCOUNTER — Telehealth: Payer: Self-pay | Admitting: *Deleted

## 2023-09-16 NOTE — Telephone Encounter (Signed)
 Patient advised It is okay for her to continue Cosentyx .  LFTs only mildly elevated which can be monitored by her PCP. Patient verbalized understanding.

## 2023-09-16 NOTE — Telephone Encounter (Signed)
 It is okay for her to continue Cosentyx .  LFTs only mildly elevated which can be monitored by her PCP.

## 2023-09-16 NOTE — Progress Notes (Signed)
 Patient switching from Taltz  to Cosentyx  due to waning efficacy.  Patient will continue COSENTYX  SQ 300 mg every 7 days for 5 weeks (week 0 dose completed in office today) then 300 mg every 28 days in combination with Methotrexate  0.6 mL subcut injections once weekly and folic acid  2 mg daily. CMP repeated today, if LFTs are still elevated will likely discontinue methotrexate .  Reference clinical support note from 09/14/2023  Sherry Pennant, PharmD, MPH, BCPS, CPP Clinical Pharmacist [facility])

## 2023-09-16 NOTE — Telephone Encounter (Signed)
 Patient contacted the office stating she was started on Cosentyx  this week. Patient states she went to see her eye doctor yesterday and was diagnosed with macular degeneration. Patient wanted to verify she would be able to continue the Cosentyx . Patient also asked if she should talk with her PCP about changing her statin with her LFTs being elevated. Please advise.

## 2023-09-19 ENCOUNTER — Other Ambulatory Visit: Payer: Self-pay

## 2023-09-19 ENCOUNTER — Other Ambulatory Visit: Payer: Self-pay | Admitting: Hematology and Oncology

## 2023-09-19 DIAGNOSIS — R928 Other abnormal and inconclusive findings on diagnostic imaging of breast: Secondary | ICD-10-CM

## 2023-09-26 ENCOUNTER — Ambulatory Visit
Admission: RE | Admit: 2023-09-26 | Discharge: 2023-09-26 | Disposition: A | Discharge status: Home / Self Care | Source: Ambulatory Visit | Attending: Hematology and Oncology

## 2023-09-26 ENCOUNTER — Ambulatory Visit

## 2023-09-26 DIAGNOSIS — R928 Other abnormal and inconclusive findings on diagnostic imaging of breast: Secondary | ICD-10-CM

## 2023-09-28 DIAGNOSIS — D239 Other benign neoplasm of skin, unspecified: Secondary | ICD-10-CM | POA: Diagnosis not present

## 2023-09-28 DIAGNOSIS — L570 Actinic keratosis: Secondary | ICD-10-CM | POA: Diagnosis not present

## 2023-09-28 DIAGNOSIS — L400 Psoriasis vulgaris: Secondary | ICD-10-CM | POA: Diagnosis not present

## 2023-09-28 DIAGNOSIS — L821 Other seborrheic keratosis: Secondary | ICD-10-CM | POA: Diagnosis not present

## 2023-09-28 DIAGNOSIS — L578 Other skin changes due to chronic exposure to nonionizing radiation: Secondary | ICD-10-CM | POA: Diagnosis not present

## 2023-09-28 DIAGNOSIS — Z79899 Other long term (current) drug therapy: Secondary | ICD-10-CM | POA: Diagnosis not present

## 2023-09-28 DIAGNOSIS — B079 Viral wart, unspecified: Secondary | ICD-10-CM | POA: Diagnosis not present

## 2023-09-28 DIAGNOSIS — D225 Melanocytic nevi of trunk: Secondary | ICD-10-CM | POA: Diagnosis not present

## 2023-09-30 ENCOUNTER — Encounter: Payer: Self-pay | Admitting: Family Medicine

## 2023-09-30 ENCOUNTER — Other Ambulatory Visit: Payer: Self-pay | Admitting: Family Medicine

## 2023-09-30 MED ORDER — HYDROCODONE-ACETAMINOPHEN 5-325 MG PO TABS: 1.0000 | ORAL_TABLET | Freq: Three times a day (TID) | ORAL | 0 refills | Status: DC | PRN

## 2023-10-02 ENCOUNTER — Other Ambulatory Visit: Payer: Self-pay | Admitting: Physician Assistant

## 2023-10-03 ENCOUNTER — Other Ambulatory Visit: Payer: Self-pay

## 2023-10-04 DIAGNOSIS — H2513 Age-related nuclear cataract, bilateral: Secondary | ICD-10-CM | POA: Diagnosis not present

## 2023-10-04 DIAGNOSIS — H353132 Nonexudative age-related macular degeneration, bilateral, intermediate dry stage: Secondary | ICD-10-CM | POA: Diagnosis not present

## 2023-10-05 ENCOUNTER — Ambulatory Visit

## 2023-10-05 ENCOUNTER — Other Ambulatory Visit: Payer: Self-pay | Admitting: Pharmacy Technician

## 2023-10-05 ENCOUNTER — Other Ambulatory Visit: Payer: Self-pay

## 2023-10-05 VITALS — Ht 64.0 in | Wt 116.0 lb

## 2023-10-05 DIAGNOSIS — Z0000 Encounter for general adult medical examination without abnormal findings: Secondary | ICD-10-CM

## 2023-10-05 NOTE — Patient Instructions (Signed)
 Ms. Hettich,  Thank you for taking the time for your Medicare Wellness Visit. I appreciate your continued commitment to your health goals. Please review the care plan we discussed, and feel free to reach out if I can assist you further.  Medicare recommends these wellness visits once per year to help you and your care team stay ahead of potential health issues. These visits are designed to focus on prevention, allowing your provider to concentrate on managing your acute and chronic conditions during your regular appointments.  Please note that Annual Wellness Visits do not include a physical exam. Some assessments may be limited, especially if the visit was conducted virtually. If needed, we may recommend a separate in-person follow-up with your provider.  Ongoing Care Seeing your primary care provider every 3 to 6 months helps us  monitor your health and provide consistent, personalized care.   Referrals If a referral was made during today's visit and you haven't received any updates within two weeks, please contact the referred provider directly to check on the status.  Recommended Screenings:  Health Maintenance  Topic Date Due   Pneumococcal Vaccine for age over 5 (2 of 2 - PCV) 10/25/2017   COVID-19 Vaccine (3 - Moderna risk series) 05/22/2019   Flu Shot  08/05/2023   Zoster (Shingles) Vaccine (2 of 2) 08/26/2023   Medicare Annual Wellness Visit  10/04/2024   Colon Cancer Screening  12/08/2032   DTaP/Tdap/Td vaccine (3 - Td or Tdap) 06/30/2033   DEXA scan (bone density measurement)  Completed   Hepatitis C Screening  Completed   HPV Vaccine  Aged Out   Meningitis B Vaccine  Aged Out   Breast Cancer Screening  Discontinued       10/05/2023   11:58 AM  Advanced Directives  Does Patient Have a Medical Advance Directive? Yes  Type of Estate agent of [place];Living will  Does patient want to make changes to medical advance directive? No - Patient declined   Copy of Healthcare Power of Attorney in Chart? Yes - validated most recent copy scanned in chart (See row information)   Advance Care Planning is important because it: Ensures you receive medical care that aligns with your values, goals, and preferences. Provides guidance to your family and loved ones, reducing the emotional burden of decision-making during critical moments.  Vision: Annual vision screenings are recommended for early detection of glaucoma, cataracts, and diabetic retinopathy. These exams can also reveal signs of chronic conditions such as diabetes and high blood pressure.  Dental: Annual dental screenings help detect early signs of oral cancer, gum disease, and other conditions linked to overall health, including heart disease and diabetes.  Please see the attached documents for additional preventive care recommendations.

## 2023-10-05 NOTE — Progress Notes (Signed)
 Subjective:   Kim Ortega is a 68 y.o. who presents for a Medicare Wellness preventive visit.  As a reminder, Annual Wellness Visits don't include a physical exam, and some assessments may be limited, especially if this visit is performed virtually. We may recommend an in-person follow-up visit with your provider if needed.  Visit Complete: Virtual I connected with  Jenkins Bud Burruss on 10/05/23 by a audio enabled telemedicine application and verified that I am speaking with the correct person using two identifiers.  Patient Location: Home  Provider Location: Home Office  I discussed the limitations of evaluation and management by telemedicine. The patient expressed understanding and agreed to proceed.  Vital Signs: Because this visit was a virtual/telehealth visit, some criteria may be missing or patient reported. Any vitals not documented were not able to be obtained and vitals that have been documented are patient reported.  VideoDeclined- This patient declined Librarian, academic. Therefore the visit was completed with audio only.  Persons Participating in Visit: Patient.  AWV Questionnaire: No: Patient Medicare AWV questionnaire was not completed prior to this visit.  Cardiac Risk Factors include: advanced age (>23men, >29 women)     Objective:    Today's Vitals   10/05/23 1131  Weight: 116 lb (52.6 kg)  Height: 5' 4 (1.626 m)   Body mass index is 19.91 kg/m.     10/05/2023   11:58 AM 04/15/2022    8:37 AM 04/09/2021    8:29 AM 10/10/2018    6:37 AM 10/06/2018    9:38 AM 09/29/2018   10:18 AM 05/20/2016   12:49 PM  Advanced Directives  Does Patient Have a Medical Advance Directive? Yes Yes Yes Yes Yes Yes No   Type of Estate agent of [place];Living will Living will;Healthcare Power of [employer] Power of [place];Living will Healthcare Power of [place];Living will Healthcare Power of [place];Living  will    Does patient want to make changes to medical advance directive? No - Patient declined No - Patient declined  No - Patient declined No - Patient declined    Copy of Healthcare Power of Attorney in Chart? Yes - validated most recent copy scanned in chart (See row information) Yes - validated most recent copy scanned in chart (See row information) Yes - validated most recent copy scanned in chart (See row information) Yes - validated most recent copy scanned in chart (See row information) Yes - validated most recent copy scanned in chart (See row information)    Would patient like information on creating a medical advance directive?    No - Patient declined        Data saved with a previous flowsheet row definition    Current Medications (verified) Outpatient Encounter Medications as of 10/05/2023  Medication Sig   ALPRAZolam  (XANAX ) 0.5 MG tablet Take 0.5 mg by mouth 3 (three) times daily as needed for anxiety.   amLODipine  (NORVASC ) 5 MG tablet TAKE 1 TABLET (5 MG TOTAL) BY MOUTH DAILY.   atenolol  (TENORMIN ) 50 MG tablet TAKE 1 TABLET BY MOUTH DAILY. * NEEDS APPT FOR REFILLS   atorvastatin  (LIPITOR) 40 MG tablet Take 1 tablet (40 mg total) by mouth daily.   Calcium  Carbonate-Vitamin D (CALCIUM -D PO) Take 1 tablet by mouth 2 (two) times daily.    clobetasol  (OLUX ) 0.05 % topical foam Apply topically.   cyclobenzaprine  (FLEXERIL ) 10 MG tablet TAKE 1 TABLET BY MOUTH THREE TIMES A DAY AS NEEDED FOR MUSCLE SPASM  fluticasone  (FLONASE ) 50 MCG/ACT nasal spray SPRAY 2 SPRAYS INTO EACH NOSTRIL EVERY DAY   folic acid  (FOLVITE ) 1 MG tablet TAKE 2 TABLETS BY MOUTH EVERY DAY   HYDROcodone -acetaminophen  (NORCO) 5-325 MG tablet Take 1 tablet by mouth every 8 (eight) hours as needed for moderate pain (pain score 4-6).   ibuprofen  (ADVIL ) 600 MG tablet Take 1 tablet (600 mg total) by mouth every 6 (six) hours as needed for moderate pain. For AFTER surgery only   Ketotifen Fumarate (ALLERGY EYE DROPS OP)  Place 1 drop into both eyes 2 (two) times daily as needed (allergies).   losartan  (COZAAR ) 50 MG tablet TAKE 1 TABLET (50 MG) BY MOUTH IN THE MORNING AND AT BEDTIME   Multiple Vitamin (MULTIVITAMIN WITH MINERALS) TABS tablet Take 1 tablet by mouth daily.   Secukinumab  (COSENTYX  UNOREADY) 300 MG/2ML SOAJ Inject 300 mg into the skin once a week. at [facility] 1, 2, 3 (Week 0 dose completed in office on 09/14/2023 with sample)   Secukinumab  (COSENTYX  UNOREADY) 300 MG/2ML SOAJ Inject 300 mg into the skin every 28 (twenty-eight) days. Week 4 then every 4 weeks   triamcinolone  cream (KENALOG ) 0.1 % 1 Application.   TUBERCULIN SYR 1CC/27GX1/2 (B-D TB SYRINGE 1CC/27GX1/2) 27G X 1/2 1 ML MISC 12 Syringes by Does not apply route once a week.   venlafaxine  XR (EFFEXOR -XR) 37.5 MG 24 hr capsule Take 1 capsule (37.5 mg total) by mouth daily with breakfast.   aspirin EC 81 MG tablet Take 81 mg by mouth daily. (Patient not taking: Reported on 10/05/2023)   methotrexate  50 MG/2ML injection Inject 0.6 mLs (15 mg total) into the skin once a week. (Patient not taking: Reported on 10/05/2023)   No facility-administered encounter medications on file as of 10/05/2023.    Allergies (verified) Patient has no known allergies.   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 ,grade 1   COPD (chronic obstructive pulmonary disease) (HCC)    Episcleritis    Hormone replacement therapy    Hyperlipidemia    Hypertension    Left carotid artery stenosis    40-59% (01/2015)   Personal history of chemotherapy 2017   neoadjuvant   Personal history of radiation therapy 2018   Psoriasis    SIADH (syndrome of inappropriate ADH production)    Smoker    Past Surgical History:  Procedure Laterality Date   ABDOMINAL HYSTERECTOMY     BREAST BIOPSY Left 06/2015   BREAST  LUMPECTOMY Left 01/2016   BREAST LUMPECTOMY WITH RADIOACTIVE SEED AND SENTINEL LYMPH NODE BIOPSY Left 01/08/2016   Procedure: LEFT BREAST LUMPECTOMY WITH RADIOACTIVE SEED AND SENTINEL LYMPH NODE BIOPSY, BLUE DYE INJECTION;  Surgeon: Donnice Bury, MD;  Location: [facility];  Service: General;  Laterality: Left;   cervical acdf     C5-6- fusion with plates/screws Dr. Alix '12   COLONOSCOPY WITH PROPOFOL  N/A 04/16/2014   Procedure: COLONOSCOPY WITH PROPOFOL ;  Surgeon: Gladis MARLA Louder, MD;  Location: [facility];  Service: Endoscopy;  Laterality: N/A;   COLPOSCOPY     laporotomy     Ovarian cyst   PORT-A-CATH REMOVAL Right 01/08/2016   Procedure: REMOVAL PORT-A-CATH;  Surgeon: Donnice Bury, MD;  Location: [facility];  Service: General;  Laterality: Right;   PORTACATH PLACEMENT N/A  07/24/2015   Procedure: INSERTION PORT-A-CATH WITH US ;  Surgeon: Donnice Bury, MD;  Location: [facility];  Service: General;  Laterality: N/A;   ROBOTIC ASSISTED SALPINGO OOPHERECTOMY Bilateral 10/10/2018   Procedure: XI ROBOTIC ASSISTED SALPINGO OOPHORECTOMY;  Surgeon: Eloy Herring, MD;  Location: [facility];  Service: Gynecology;  Laterality: Bilateral;   TONSILLECTOMY     child   Family History  Problem Relation Age of Onset   Stroke Mother    Colon polyps Sister    Colon cancer Brother    Esophageal cancer Neg Hx    Rectal cancer Neg Hx    Stomach cancer Neg Hx    Breast cancer Neg Hx    Social History   Socioeconomic History   Marital status: Married    Spouse name: Micheal   Number of children: 0   Years of education: Not on file   Highest education level: Not on file  Occupational History   Not on file  Tobacco Use   Smoking status: Every Day    Current packs/day: 0.50    Average packs/day: 0.5 packs/day for 20.0 years (10.0 ttl pk-yrs)    Types: Cigarettes    Passive exposure: Current   Smokeless tobacco: Never  Vaping Use   Vaping status: Some Days   Substance and Sexual Activity   Alcohol use: Yes    Alcohol/week: 10.0 standard drinks of alcohol    Types: 10 Glasses of wine per week    Comment: Glasses of wine several times a week   Drug use: Yes    Types: Marijuana    Comment: 1 monthly   Sexual activity: Yes    Birth control/protection: Surgical    Comment: married  Other Topics Concern   Not on file  Social History Narrative   Married x 25 years.   Family lives nearby.   Social Drivers of Corporate investment banker Strain: Low Risk  (10/05/2023)   Overall Financial Resource Strain (CARDIA)    Difficulty of Paying Living Expenses: Not hard at all  Food Insecurity: No Food Insecurity (10/05/2023)   Hunger Vital Sign    Worried About Running Out of Food in the Last Year: Never true    Ran Out of Food in the Last Year: Never true  Transportation Needs: No Transportation Needs (10/05/2023)   PRAPARE - Administrator, Civil Service (Medical): No    Lack of Transportation (Non-Medical): No  Physical Activity: Insufficiently Active (10/05/2023)   Exercise Vital Sign    Days of Exercise per Week: 3 days    Minutes of Exercise per Session: 30 min  Stress: No Stress Concern Present (10/05/2023)   [employer] of Occupational Health - Occupational Stress Questionnaire    Feeling of Stress: Not at all  Social Connections: Socially Integrated (10/05/2023)   Social Connection and Isolation Panel    Frequency of Communication with Friends and Family: More than three times a week    Frequency of Social Gatherings with Friends and Family: Three times a week    Attends Religious Services: More than 4 times per year    Active Member of Clubs or Organizations: Yes    Attends Engineer, structural: More than 4 times per year    Marital Status: Married    Tobacco Counseling Ready to quit: Not Answered Counseling given: Not Answered    Clinical Intake:  Pre-visit preparation completed: Yes  Pain :  No/denies pain  Diabetes: No   How often do  you need to have someone help you when you read instructions, pamphlets, or other written materials from your doctor or pharmacy?: 1 - Never  Interpreter Needed?: No  Information entered by :: Charmaine Bloodgood LPN   Activities of Daily Living     10/05/2023   11:58 AM  In your present state of health, do you have any difficulty performing the following activities:  Hearing? 0  Vision? 0  Difficulty concentrating or making decisions? 0  Walking or climbing stairs? 0  Dressing or bathing? 0  Doing errands, shopping? 0  Preparing Food and eating ? N  Using the Toilet? N  In the past six months, have you accidently leaked urine? N  Do you have problems with loss of bowel control? N  Managing your Medications? N  Managing your Finances? N  Housekeeping or managing your Housekeeping? N    Patient Care Team: Duanne Butler DASEN, MD as PCP - General (Family Medicine) Odean Potts, MD as Consulting Physician (Hematology and Oncology) Crawford, Morna Pickle, NP as Nurse Practitioner (Hematology and Oncology) Dewey Rush, MD as Consulting Physician (Radiation Oncology) Ebbie Cough, MD as Consulting Physician (General Surgery) Robinson Pao, MD as Consulting Physician (Dermatology) [facility], [facility] Rankin, Arley LABOR, MD as Consulting Physician (Ophthalmology) Dolphus Reiter, MD as Consulting Physician (Rheumatology)  I have updated your Care Teams any recent Medical Services you may have received from other providers in the past year.     Assessment:   This is a routine wellness examination for Trulee.  Hearing/Vision screen Hearing Screening - Comments:: Patient is able to hear conversational tones without difficulty. No issues reported.    Vision Screening - Comments:: Wears rx glasses - up to date with routine eye exams with [facility] and Dr. Elner   Goals Addressed              This Visit's Progress    Maintain health and independence   On track      Depression Screen     10/05/2023   11:57 AM 03/25/2023   10:50 AM 09/15/2022    9:38 AM 06/21/2022   11:26 AM 04/15/2022    8:38 AM 10/22/2021    8:54 AM 04/09/2021    8:23 AM  PHQ 2/9 Scores  PHQ - 2 Score 0 0 0 0 0 1 0    Fall Risk     10/05/2023   11:58 AM 03/25/2023   10:50 AM 09/15/2022    9:38 AM 06/21/2022   11:26 AM 04/15/2022    8:36 AM  Fall Risk   Falls in the past year? 0 0 0 0 0  Number falls in past yr: 0 0  0 0  Injury with Fall? 0 0  0 0  Risk for fall due to : No Fall Risks No Fall Risks  No Fall Risks No Fall Risks  Follow up Falls prevention discussed;Education provided;Falls evaluation completed Falls prevention discussed;Falls evaluation completed  Falls prevention discussed Falls prevention discussed;Education provided;Falls evaluation completed    MEDICARE RISK AT HOME:  Medicare Risk at Home Any stairs in or around the home?: No If so, are there any without handrails?: No Home free of loose throw rugs in walkways, pet beds, electrical cords, etc?: Yes Adequate lighting in your home to reduce risk of falls?: Yes Life alert?: No Use of a cane, walker or w/c?: No Grab bars in the bathroom?: Yes Shower chair or bench in shower?: No  Elevated toilet seat or a handicapped toilet?: Yes  TIMED UP AND GO:  Was the test performed?  No  Cognitive Function: 6CIT completed        10/05/2023   11:58 AM 04/15/2022    8:37 AM 04/09/2021    8:32 AM  6CIT Screen  What Year? 0 points 0 points 0 points  What month? 0 points 0 points 0 points  What time? 0 points 0 points 0 points  Count back from 20 0 points 0 points 0 points  Months in reverse 0 points 0 points 0 points  Repeat phrase 0 points 0 points 0 points  Total Score 0 points 0 points 0 points    Immunizations Immunization History  Administered Date(s) Administered   Influenza Split 11/04/2012   Influenza, Seasonal,  Injecte, Preservative Fre 09/16/2022   Influenza,inj,Quad PF,6+ Mos 10/25/2016, 09/20/2018   Influenza-Unspecified 11/04/2013   MODERNA COVID-19 SARS-COV-2 PEDS BIVALENT BOOSTER 75yr-24yr 03/27/2019, 04/24/2019   Pneumococcal Polysaccharide-23 10/25/2016   Tdap 07/04/2009, 07/01/2023   Zoster Recombinant(Shingrix) 07/01/2023    Screening Tests Health Maintenance  Topic Date Due   Pneumococcal Vaccine: 50+ Years (2 of 2 - PCV) 10/25/2017   COVID-19 Vaccine (3 - Moderna risk series) 05/22/2019   Influenza Vaccine  08/05/2023   Zoster Vaccines- Shingrix (2 of 2) 08/26/2023   Medicare Annual Wellness (AWV)  10/04/2024   Colonoscopy  12/08/2032   DTaP/Tdap/Td (3 - Td or Tdap) 06/30/2033   DEXA SCAN  Completed   Hepatitis C Screening  Completed   HPV VACCINES  Aged Out   Meningococcal B Vaccine  Aged Out   Mammogram  Discontinued    Health Maintenance Items Addressed: Patient will have dexa with gynecologist; information provided on vaccine recommendations   Additional Screening:  Vision Screening: Recommended annual ophthalmology exams for early detection of glaucoma and other disorders of the eye. Is the patient up to date with their annual eye exam?  Yes  Who is the provider or what is the name of the office in which the patient attends annual eye exams? [facility] and Dr. Elner  Dental Screening: Recommended annual dental exams for proper oral hygiene  Community Resource Referral / Chronic Care Management: CRR required this visit?  No   CCM required this visit?  No   Plan:    I have personally reviewed and noted the following in the patient's chart:   Medical and social history Use of alcohol, tobacco or illicit drugs  Current medications and supplements including opioid prescriptions. Patient is not currently taking opioid prescriptions. Functional ability and status Nutritional status Physical activity Advanced directives List of other  physicians Hospitalizations, surgeries, and ER visits in previous 12 months Vitals Screenings to include cognitive, depression, and falls Referrals and appointments  In addition, I have reviewed and discussed with patient certain preventive protocols, quality metrics, and best practice recommendations. A written personalized care plan for preventive services as well as general preventive health recommendations were provided to patient.   Lavelle Pfeiffer [place], [place]   89/08/7972   After Visit Summary: (MyChart) Due to this being a telephonic visit, the after visit summary with patients personalized plan was offered to patient via MyChart   Notes: Nothing significant to report at this time.

## 2023-10-05 NOTE — Progress Notes (Signed)
 Specialty Pharmacy Refill Coordination Note  Kim Ortega is a 68 y.o. female contacted today regarding refills of specialty medication(s) Secukinumab  (Cosentyx  UnoReady)   Patient requested Delivery   Delivery date: 10/07/23   Verified address: [address] [place]   Medication will be filled on 10/06/23.

## 2023-10-06 ENCOUNTER — Other Ambulatory Visit: Payer: Self-pay

## 2023-10-06 ENCOUNTER — Ambulatory Visit: Admitting: Physician Assistant

## 2023-10-06 ENCOUNTER — Telehealth: Payer: Self-pay

## 2023-10-06 MED ORDER — ATORVASTATIN CALCIUM 40 MG PO TABS: 40.0000 mg | ORAL_TABLET | Freq: Every day | ORAL | 0 refills | Status: DC

## 2023-10-06 NOTE — Telephone Encounter (Signed)
 Prescription Request  10/06/2023  LOV: 03/25/23  What is the name of the medication or equipment? atorvastatin  (LIPITOR) 40 MG tablet [[id number]]   Have you contacted your pharmacy to request a refill? Yes   Which pharmacy would you like this sent to?  [id number] [place], [place] - [address]  [place] [address] Phone: [phone] Fax: [phone]    Patient notified that their request is being sent to the clinical staff for review and that they should receive a response within 2 business days.   Please advise at [facility] [phone]

## 2023-10-06 NOTE — Telephone Encounter (Signed)
 Sent in medication

## 2023-10-07 ENCOUNTER — Other Ambulatory Visit: Payer: Self-pay

## 2023-10-12 DIAGNOSIS — Z1272 Encounter for screening for malignant neoplasm of vagina: Secondary | ICD-10-CM | POA: Diagnosis not present

## 2023-10-12 DIAGNOSIS — Z01419 Encounter for gynecological examination (general) (routine) without abnormal findings: Secondary | ICD-10-CM | POA: Diagnosis not present

## 2023-10-12 DIAGNOSIS — F419 Anxiety disorder, unspecified: Secondary | ICD-10-CM | POA: Diagnosis not present

## 2023-10-12 DIAGNOSIS — Z853 Personal history of malignant neoplasm of breast: Secondary | ICD-10-CM | POA: Diagnosis not present

## 2023-10-13 NOTE — Progress Notes (Unsigned)
 Office Visit Note  Patient: Kim Ortega             Date of Birth: Dec 09, 1955           MRN: [id number]             PCP: Duanne Butler DASEN, MD Referring: Duanne Butler DASEN, MD Visit Date: 10/27/2023 Occupation: Data Unavailable  Subjective:  Medication monitoring   History of Present Illness: Kim Ortega is a 68 y.o. female with history of psoriatic arthritis.  She was started on cosentyx  on 09/14/23.  She has completed the loading dose of Cosentyx  and states that her next dose is due on 11/09/2023.  She is tolerating Cosentyx  without any side effects and has not had any gaps in therapy.  She denies any recent or recurrent infections.  She has noticed a significant improvement in her symptoms since switching from Taltz  to Cosentyx .  She has decided to discontinue methotrexate  due to side effects.  She denies any joint swelling at this time.  She has not had any nocturnal pain.  Overall she feels that her symptoms are well-controlled on the current treatment regimen.  Patient states that she still has a few patches of scalp psoriasis but was recently evaluated by Dr. Robinson.  She uses topical agents as needed.  She denies any new patches of psoriasis since switching to Cosentyx .    Activities of Daily Living:  Patient reports morning stiffness for 2-3 hours.   Patient Denies nocturnal pain.  Difficulty dressing/grooming: Denies Difficulty climbing stairs: Denies Difficulty getting out of chair: Denies Difficulty using hands for taps, buttons, cutlery, and/or writing: Reports  Review of Systems  Constitutional:  Positive for fatigue.  HENT:  Negative for mouth sores and mouth dryness.   Eyes:  Negative for dryness.  Respiratory:  Negative for shortness of breath.   Cardiovascular:  Negative for chest pain and palpitations.  Gastrointestinal:  Negative for blood in stool, constipation and diarrhea.  Endocrine: Negative for increased urination.  Genitourinary:  Negative  for involuntary urination.  Musculoskeletal:  Positive for joint pain, joint pain, joint swelling, myalgias, morning stiffness, muscle tenderness and myalgias. Negative for gait problem and muscle weakness.  Skin:  Negative for color change, rash, hair loss and sensitivity to sunlight.  Allergic/Immunologic: Negative for susceptible to infections.  Neurological:  Negative for dizziness and headaches.  Hematological:  Negative for swollen glands.  Psychiatric/Behavioral:  Negative for depressed mood and sleep disturbance. The patient is not nervous/anxious.     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) 10/06/2015   Peripheral neuropathy due to chemotherapy 10/06/2015   Antineoplastic chemotherapy induced anemia 08/27/2015   Breast cancer of lower-inner quadrant of left female breast (HCC) 07/03/2015   Left carotid artery stenosis    Psoriasis    Hormone replacement therapy    Smoker    Episcleritis     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 ,grade 1   COPD (chronic obstructive pulmonary  disease) (HCC)    Episcleritis    Hormone replacement therapy    Hyperlipidemia    Hypertension    Left carotid artery stenosis    40-59% (01/2015)   Macular degeneration    Personal history of chemotherapy 2017   neoadjuvant   Personal history of radiation therapy 2018   Psoriasis    SIADH (syndrome of inappropriate ADH production)    Smoker     Family History  Problem Relation Age of Onset   Stroke Mother    Colon polyps  Sister    Colon cancer Brother    Esophageal cancer Neg Hx    Rectal cancer Neg Hx    Stomach cancer Neg Hx    Breast cancer Neg Hx    Past Surgical History:  Procedure Laterality Date   ABDOMINAL HYSTERECTOMY     BREAST BIOPSY Left 06/2015   BREAST LUMPECTOMY Left 01/2016   BREAST LUMPECTOMY WITH RADIOACTIVE SEED AND SENTINEL LYMPH NODE BIOPSY Left 01/08/2016   Procedure: LEFT BREAST LUMPECTOMY WITH RADIOACTIVE SEED AND SENTINEL LYMPH NODE BIOPSY, BLUE DYE INJECTION;  Surgeon: Donnice Bury, MD;  Location: [facility];  Service: General;  Laterality: Left;   cervical acdf     C5-6- fusion with plates/screws Dr. Alix '12   COLONOSCOPY WITH PROPOFOL  N/A 04/16/2014   Procedure: COLONOSCOPY WITH PROPOFOL ;  Surgeon: Gladis MARLA Louder, MD;  Location: [facility];  Service: Endoscopy;  Laterality: N/A;   COLPOSCOPY     laporotomy     Ovarian cyst   PORT-A-CATH REMOVAL Right 01/08/2016   Procedure: REMOVAL PORT-A-CATH;  Surgeon: Donnice Bury, MD;  Location:  SUR[facility]ERY CENTER;  Service: General;  Laterality: Right;   PORTACATH PLACEMENT N/A 07/24/2015   Procedure: INSERTION PORT-A-CATH WITH US ;  Surgeon: Donnice Bury, MD;  Location: [facility] OR;  Service: General;  Laterality: N/A;   ROBOTIC ASSISTED SALPINGO OOPHERECTOMY Bilateral 10/10/2018   Procedure: XI ROBOTIC ASSISTED SALPINGO OOPHORECTOMY;  Surgeon: Eloy Herring, MD;  Location: [facility];  Service: Gynecology;  Laterality: Bilateral;   TONSILLECTOMY     child   Social History   Tobacco Use   Smoking status: Every Day    Current packs/day: 0.50    Average packs/day: 0.5 packs/day for 20.0 years (10.0 ttl pk-yrs)    Types: Cigarettes    Passive exposure: Current   Smokeless tobacco: Never  Vaping Use   Vaping status: Some Days  Substance Use Topics   Alcohol use: Yes    Alcohol/week: 10.0 standard drinks of alcohol    Types: 10 Glasses of wine per week    Comment: Glasses of wine several times a  week   Drug use: Yes    Types: Marijuana    Comment: 1 monthly   Social History   Social History Narrative   Married x 25 years.   Family lives nearby.     Immunization History  Administered Date(s) Administered   Influenza Split 11/04/2012   Influenza, Seasonal, Injecte, Preservative Fre 09/16/2022   Influenza,inj,Quad PF,6+ Mos 10/25/2016, 09/20/2018   Influenza-Unspecified 11/04/2013   MODERNA COVID-19 SARS-COV-2 PEDS BIVALENT BOOSTER 53yr-13yr 03/27/2019, 04/24/2019   Pneumococcal Polysaccharide-23 10/25/2016   Tdap 07/04/2009, 07/01/2023   Zoster Recombinant(Shingrix) 07/01/2023     Objective: Vital Signs: BP 103/66   Pulse (!) 111   Temp 98.1 F (36.7 C)   Resp 16   Ht 5' 4 (1.626 m)   Wt 114 lb 6.4 oz (51.9 kg)   BMI 19.64 kg/m  Physical Exam Vitals and nursing note reviewed.  Constitutional:      Appearance: She is well-developed.  HENT:     Head: Normocephalic and atraumatic.  Eyes:     Conjunctiva/sclera: Conjunctivae normal.  Cardiovascular:     Rate and Rhythm: Normal rate and regular rhythm.     Heart sounds: Normal heart sounds.  Pulmonary:     Effort: Pulmonary effort is normal.     Breath sounds: Normal breath sounds.  Abdominal:     General: Bowel sounds are normal.     Palpations: Abdomen is soft.  Musculoskeletal:     Cervical back: Normal range of motion.  Lymphadenopathy:     Cervical: No cervical adenopathy.  Skin:    General: Skin is warm and dry.     Capillary Refill: Capillary refill takes less than 2 seconds.  Neurological:     Mental Status: She is alert and oriented to person, place, and time.  Psychiatric:        Behavior: Behavior normal.      Musculoskeletal Exam: C-spine has limited range of motion with lateral rotation.  Some discomfort with mobility of the lumbar spine.  Shoulder joints, elbow joints, wrist joints, MCPs, PIPs, DIPs have good range of motion with no synovitis.  Complete fist formation bilaterally.   Hip joints have good range of motion with no groin pain.  Knee joints have good range of motion no warmth or effusion.  Ankle joints have good range of motion no tenderness or joint swelling.  No evidence of Achilles tendinitis or plantar fasciitis.   CDAI Exam: CDAI Score: -- Patient Global: --; Provider Global: -- Swollen: --; Tender: -- Joint Exam 10/27/2023   No joint exam has been documented for this visit   There is currently no information documented on the homunculus. Go to the Rheumatology activity and complete the homunculus joint exam.  Investigation: No additional findings.  Imaging: No results found.   Recent Labs: Lab Results  Component Value Date   WBC 7.9 08/26/2023   HGB 12.0 08/26/2023   PLT 310 08/26/2023   NA 134 (L) 09/14/2023   K 4.6 09/14/2023   CL 100 09/14/2023   CO2 24 09/14/2023   GLUCOSE 83 09/14/2023   BUN 9 09/14/2023   CREATININE 0.68 09/14/2023   BILITOT 0.3 09/14/2023   ALKPHOS 77 03/12/2019   AST 43 (H) 09/14/2023   ALT 30 (H) 09/14/2023   PROT 6.8 09/14/2023   ALBUMIN 4.2 03/12/2019   CALCIUM  9.4 09/14/2023   GFRAA 100 06/17/2020   QFTBGOLDPLUS NEGATIVE 10/19/2022    Speciality Comments: Tremfya stopped 11/15/2022, Taltz  started 12/13/2022; Cosentyx  started 09/14/2023  Procedures:  No procedures performed Allergies: Patient has no known allergies.     Assessment / Plan:     Visit Diagnoses: Psoriatic arthropathy (HCC): She has no synovitis or dactylitis on examination today.  No evidence of Achilles tendinitis or plantar fasciitis.  No SI joint pain.  She was initiated on Cosentyx  09/14/2023.  She is tolerating Cosentyx  without any side effects or injection site reactions.  She has not had any gaps in therapy and has completed the loading dose.  She has discontinued methotrexate  due to side effects.  She has noticed a significant benefit since switching from Taltz  to Cosentyx .  She is not exhibiting any signs or symptoms of a flare.   No medication changes will be made at this time.  She is vies notify us  if she develops signs or symptoms of a  flare.  She will follow-up in the office in 3 months or sooner if needed.  Psoriasis: She has a few chronic patches of psoriasis on the scalp-unchanged. No new patches.  She remains under the care of Dr. Robinson.   High risk medication use -Cosentyx  300 mg sq injections every 28 days.  She has completed the loading dose of Cosentyx .  Cosentyx  was initiated on 09/14/2023 Previous therapy: Tremfya, Taltz , and methotrexate  CBC and CMP updated on 08/26/23. Orders for CBC and CMP released today.  Her next Abrol will be due in January and every 3 months to monitor for drug toxicity. CMP updated on 09/14/23.  TB gold negative on 10/19/22.  Order for TB gold released today. No recent or recurrent infections. Discussed the importance of holding cosentyx  and methotrexate  if she develops signs or symptoms of an infection and to resume once the infection has completely cleared.  - Plan: QuantiFERON-TB Gold Plus, CBC with Differential/Platelet, Comprehensive metabolic panel with GFR  Screening for tuberculosis - Order for TB gold released today.  Plan: QuantiFERON-TB Gold Plus  Rheumatoid factor positive -Rheumatoid factor 24, anti-CCP negative, normal, CRP negative  Episcleritis of left eye: She has not had any signs or symptoms of a flare.  She remains under the close care of ophthalmology and is also established care with Dr. Elner.  According to the patient she has been diagnosed with macular degeneration bilaterally and is currently undergoing treatment.   Swelling of left wrist: Resolved.  No tenderness or synovitis noted today.   Pain in both hands: No synovitis or dactylitis noted on examination today.  She is able to make a complete fist bilaterally.  S/P cervical spinal fusion: C-spine has limited range of motion with lateral rotation.  DDD (degenerative disc disease), cervical: C-spine  has limited range of motion with lateral rotation.  Spondylosis of lumbar spine - MRI January 12, 2021 showed mild degeneration with foraminal stenosis.  Epidural injection by Dr. Eldonna in April 2023 relieved pain. She continues to experience intermittent discomfort in her lower back.  No symptoms of radiculopathy at this time.  Trochanteric bursitis of both hips: Intermittent discomfort.  Other medical conditions are listed as follows:   Malignant neoplasm of lower-inner quadrant of left breast in female, estrogen receptor positive [facility]) - dxd June 2017.  Antineoplastic chemotherapy induced anemia  Chemotherapy-induced peripheral neuropathy  Left carotid artery stenosis  Hormone replacement therapy  SIADH (syndrome of inappropriate ADH production)  Smoker   Orders: Orders Placed This Encounter  Procedures   QuantiFERON-TB Gold Plus   CBC with Differential/Platelet   Comprehensive metabolic panel with GFR   No orders of the defined types were placed in this encounter.    Follow-Up Instructions: Return in about 3 months (around 01/27/2024) for Psoriatic arthritis.   Waddell CHRISTELLA Craze, PA-C  Note - This record has been created using Dragon software.  Chart creation errors have been sought, but may not always  have been located. Such creation errors do not reflect on  the standard of medical care.

## 2023-10-24 ENCOUNTER — Encounter: Payer: Self-pay | Admitting: Family Medicine

## 2023-10-24 ENCOUNTER — Other Ambulatory Visit: Payer: Self-pay

## 2023-10-24 MED ORDER — CYCLOBENZAPRINE HCL 10 MG PO TABS: ORAL_TABLET | ORAL | 0 refills | Status: DC

## 2023-10-24 MED ORDER — HYDROCODONE-ACETAMINOPHEN 5-325 MG PO TABS: 1.0000 | ORAL_TABLET | Freq: Three times a day (TID) | ORAL | 0 refills | Status: DC | PRN

## 2023-10-25 DIAGNOSIS — H353132 Nonexudative age-related macular degeneration, bilateral, intermediate dry stage: Secondary | ICD-10-CM | POA: Diagnosis not present

## 2023-10-25 DIAGNOSIS — H43812 Vitreous degeneration, left eye: Secondary | ICD-10-CM | POA: Diagnosis not present

## 2023-10-25 DIAGNOSIS — H43392 Other vitreous opacities, left eye: Secondary | ICD-10-CM | POA: Diagnosis not present

## 2023-10-25 DIAGNOSIS — H2513 Age-related nuclear cataract, bilateral: Secondary | ICD-10-CM | POA: Diagnosis not present

## 2023-10-27 ENCOUNTER — Encounter: Payer: Self-pay | Admitting: Physician Assistant

## 2023-10-27 ENCOUNTER — Ambulatory Visit: Attending: Physician Assistant | Admitting: Physician Assistant

## 2023-10-27 VITALS — BP 103/66 | HR 111 | Temp 98.1°F | Resp 16 | Ht 64.0 in | Wt 114.4 lb

## 2023-10-27 DIAGNOSIS — M5030 Other cervical disc degeneration, unspecified cervical region: Secondary | ICD-10-CM

## 2023-10-27 DIAGNOSIS — L4050 Arthropathic psoriasis, unspecified: Secondary | ICD-10-CM

## 2023-10-27 DIAGNOSIS — T451X5A Adverse effect of antineoplastic and immunosuppressive drugs, initial encounter: Secondary | ICD-10-CM

## 2023-10-27 DIAGNOSIS — H15102 Unspecified episcleritis, left eye: Secondary | ICD-10-CM

## 2023-10-27 DIAGNOSIS — C50312 Malignant neoplasm of lower-inner quadrant of left female breast: Secondary | ICD-10-CM

## 2023-10-27 DIAGNOSIS — F17200 Nicotine dependence, unspecified, uncomplicated: Secondary | ICD-10-CM

## 2023-10-27 DIAGNOSIS — M79642 Pain in left hand: Secondary | ICD-10-CM

## 2023-10-27 DIAGNOSIS — M25432 Effusion, left wrist: Secondary | ICD-10-CM

## 2023-10-27 DIAGNOSIS — Z111 Encounter for screening for respiratory tuberculosis: Secondary | ICD-10-CM

## 2023-10-27 DIAGNOSIS — E222 Syndrome of inappropriate secretion of antidiuretic hormone: Secondary | ICD-10-CM

## 2023-10-27 DIAGNOSIS — R7689 Other specified abnormal immunological findings in serum: Secondary | ICD-10-CM

## 2023-10-27 DIAGNOSIS — M79641 Pain in right hand: Secondary | ICD-10-CM

## 2023-10-27 DIAGNOSIS — D6481 Anemia due to antineoplastic chemotherapy: Secondary | ICD-10-CM

## 2023-10-27 DIAGNOSIS — Z79890 Hormone replacement therapy (postmenopausal): Secondary | ICD-10-CM

## 2023-10-27 DIAGNOSIS — Z981 Arthrodesis status: Secondary | ICD-10-CM | POA: Diagnosis not present

## 2023-10-27 DIAGNOSIS — L409 Psoriasis, unspecified: Secondary | ICD-10-CM | POA: Diagnosis not present

## 2023-10-27 DIAGNOSIS — G620 Drug-induced polyneuropathy: Secondary | ICD-10-CM

## 2023-10-27 DIAGNOSIS — M7062 Trochanteric bursitis, left hip: Secondary | ICD-10-CM

## 2023-10-27 DIAGNOSIS — I6522 Occlusion and stenosis of left carotid artery: Secondary | ICD-10-CM

## 2023-10-27 DIAGNOSIS — Z79899 Other long term (current) drug therapy: Secondary | ICD-10-CM

## 2023-10-27 DIAGNOSIS — M7061 Trochanteric bursitis, right hip: Secondary | ICD-10-CM

## 2023-10-27 DIAGNOSIS — Z170 Estrogen receptor positive status [ER+]: Secondary | ICD-10-CM

## 2023-10-27 DIAGNOSIS — M47816 Spondylosis without myelopathy or radiculopathy, lumbar region: Secondary | ICD-10-CM

## 2023-10-27 NOTE — Patient Instructions (Signed)
 Standing Labs We placed an order today for your standing lab work.   Please have your standing labs drawn in January and every 3 months   Please have your labs drawn 2 weeks prior to your appointment so that the provider can discuss your lab results at your appointment, if possible.  Please note that you may see your imaging and lab results in MyChart before we have reviewed them. We will contact you once all results are reviewed. Please allow our office up to 72 hours to thoroughly review all of the results before contacting the office for clarification of your results.  WALK-IN LAB HOURS  Monday through Thursday from 8:00 am -12:30 pm and 1:00 pm-4:30 pm and Friday from 8:00 am-12:00 pm.  Patients with office visits requiring labs will be seen before walk-in labs.  You may encounter longer than normal wait times. Please allow additional time. Wait times may be shorter on  Monday and Thursday afternoons.  We do not book appointments for walk-in labs. We appreciate your patience and understanding with our staff.   Labs are drawn by [facility]. Please bring your co-pay at the time of your lab draw.  You may receive a bill from [facility] for your lab work.  Please note if you are on Hydroxychloroquine  and and an order has been placed for a Hydroxychloroquine  level,  you will need to have it drawn 4 hours or more after your last dose.  If you wish to have your labs drawn at another location, please call the office 24 hours in advance so we can fax the orders.  The office is located at [address], [address], [place], [place] [address]   If you have any questions regarding directions or hours of operation,  please call [phone].   As a reminder, please drink plenty of water prior to coming for your lab work. Thanks!

## 2023-10-29 LAB — COMPREHENSIVE METABOLIC PANEL WITH GFR
AG Ratio: 1.2 (calc) (ref 1.0–2.5)
ALT: 27 U/L (ref 6–29)
AST: 32 U/L (ref 10–35)
Albumin: 3.9 g/dL (ref 3.6–5.1)
Alkaline phosphatase (APISO): 129 U/L (ref 37–153)
BUN: 11 mg/dL (ref 7–25)
CO2: 28 mmol/L (ref 20–32)
Calcium: 9.5 mg/dL (ref 8.6–10.4)
Chloride: 98 mmol/L (ref 98–110)
Creat: 0.66 mg/dL (ref 0.50–1.05)
Globulin: 3.2 g/dL (ref 1.9–3.7)
Glucose, Bld: 111 mg/dL — ABNORMAL HIGH (ref 65–99)
Potassium: 4.5 mmol/L (ref 3.5–5.3)
Sodium: 134 mmol/L — ABNORMAL LOW (ref 135–146)
Total Bilirubin: 0.4 mg/dL (ref 0.2–1.2)
Total Protein: 7.1 g/dL (ref 6.1–8.1)
eGFR: 95 mL/min/1.73m2 (ref >=60)

## 2023-10-29 LAB — CBC WITH DIFFERENTIAL/PLATELET
Absolute Lymphocytes: 1879 {cells}/uL (ref 850–3900)
Absolute Monocytes: 739 {cells}/uL (ref 200–950)
Basophils Absolute: 39 {cells}/uL (ref 0–200)
Basophils Relative: 0.5 %
Eosinophils Absolute: 31 {cells}/uL (ref 15–500)
Eosinophils Relative: 0.4 %
HCT: 36.5 % (ref 35.0–45.0)
Hemoglobin: 12.3 g/dL (ref 11.7–15.5)
MCH: 35.1 pg — ABNORMAL HIGH (ref 27.0–33.0)
MCHC: 33.7 g/dL (ref 32.0–36.0)
MCV: 104.3 fL — ABNORMAL HIGH (ref 80.0–100.0)
MPV: 9.3 fL (ref 7.5–12.5)
Monocytes Relative: 9.6 %
Neutro Abs: 5013 {cells}/uL (ref 1500–7800)
Neutrophils Relative %: 65.1 %
Platelets: 387 Thousand/uL (ref 140–400)
RBC: 3.5 Million/uL — ABNORMAL LOW (ref 3.80–5.10)
RDW: 12.1 % (ref 11.0–15.0)
Total Lymphocyte: 24.4 %
WBC: 7.7 Thousand/uL (ref 3.8–10.8)

## 2023-10-29 LAB — QUANTIFERON-TB GOLD PLUS
Mitogen-NIL: 3.15 [IU]/mL
NIL: 0.03 [IU]/mL
QuantiFERON-TB Gold Plus: NEGATIVE
TB1-NIL: <0 [IU]/mL
TB2-NIL: <0 [IU]/mL

## 2023-10-30 ENCOUNTER — Ambulatory Visit: Payer: Self-pay | Admitting: Physician Assistant

## 2023-10-30 NOTE — Progress Notes (Signed)
 TB gold negative  CBC stable.  Glucose is 111. Sodium remains low but stable.  LFTs have returned to [facility] since discontinuing methotrexate . Rest of CMP WNL

## 2023-11-02 ENCOUNTER — Other Ambulatory Visit: Payer: Self-pay

## 2023-11-02 NOTE — Progress Notes (Signed)
 Specialty Pharmacy Refill Coordination Note  Kim Ortega is a 68 y.o. female contacted today regarding refills of specialty medication(s) Secukinumab  (Cosentyx  UnoReady)   Patient requested Delivery   Delivery date: 11/09/23   Verified address: [address] [place]   Medication will be filled on: 11/08/23

## 2023-11-18 ENCOUNTER — Other Ambulatory Visit: Payer: Self-pay | Admitting: Family Medicine

## 2023-11-18 ENCOUNTER — Encounter: Payer: Self-pay | Admitting: Family Medicine

## 2023-11-18 MED ORDER — HYDROCODONE-ACETAMINOPHEN 5-325 MG PO TABS: 1.0000 | ORAL_TABLET | Freq: Three times a day (TID) | ORAL | 0 refills | Status: DC | PRN

## 2023-11-28 DIAGNOSIS — L400 Psoriasis vulgaris: Secondary | ICD-10-CM | POA: Diagnosis not present

## 2023-11-28 DIAGNOSIS — L821 Other seborrheic keratosis: Secondary | ICD-10-CM | POA: Diagnosis not present

## 2023-11-28 DIAGNOSIS — L570 Actinic keratosis: Secondary | ICD-10-CM | POA: Diagnosis not present

## 2023-11-29 ENCOUNTER — Other Ambulatory Visit: Payer: Self-pay | Admitting: Rheumatology

## 2023-11-29 ENCOUNTER — Other Ambulatory Visit (HOSPITAL_COMMUNITY): Payer: Self-pay

## 2023-11-29 ENCOUNTER — Other Ambulatory Visit: Payer: Self-pay

## 2023-11-29 DIAGNOSIS — L409 Psoriasis, unspecified: Secondary | ICD-10-CM

## 2023-11-29 DIAGNOSIS — Z79899 Other long term (current) drug therapy: Secondary | ICD-10-CM

## 2023-11-29 DIAGNOSIS — L4050 Arthropathic psoriasis, unspecified: Secondary | ICD-10-CM

## 2023-11-29 MED ORDER — COSENTYX UNOREADY 300 MG/2ML ~~LOC~~ SOAJ
300.0000 mg | SUBCUTANEOUS | 2 refills | Status: DC
  Filled 2023-11-29: qty 2, 28d supply, fill #0

## 2023-11-29 NOTE — Telephone Encounter (Signed)
 Last Fill: 09/14/2023  Labs: 10/27/2023 CBC stable.  Glucose is 111. Sodium remains low but stable.  LFTs have returned to [facility] since discontinuing methotrexate . Rest of CMP WNL  TB Gold: 10/27/2023 Neg    Next Visit: 02/06/2024  Last Visit: 10/27/2023  IK:Ednmpjupr arthropathy   Current Dose per office note 10/27/2023: Cosentyx  300 mg sq injections every 28 days.    Okay to refill Cosentyx ?

## 2023-11-30 NOTE — Progress Notes (Unsigned)
 Office Visit Note  Patient: Kim Ortega             Date of Birth: 30-Mar-1955           MRN: [id number]             PCP: Duanne Butler DASEN, MD Referring: Duanne Butler DASEN, MD Visit Date: 12/07/2023 Occupation: Data Unavailable  Subjective:  Discuss medications   History of Present Illness: Kim Ortega is a 68 y.o. female with history of psoriatic arthritis. Patient is currently prescribed Cosentyx  300 mg sq injections q28 days. She has completed the loading dose of Cosentyx . Cosentyx  was initiated on 09/14/2023   CMP updated on 12/05/23 Lipid panel updated on 12/05/23. CBC updated on 10/27/23 Discussed the importance of holding cosentyx  if she develops signs or symptoms of an infection and to resume once the infection has completely cleared.   Activities of Daily Living:  Patient reports morning stiffness for *** {minute/hour:19697}.   Patient {ACTIONS;DENIES/REPORTS:21021675::Denies} nocturnal pain.  Difficulty dressing/grooming: {ACTIONS;DENIES/REPORTS:21021675::Denies} Difficulty climbing stairs: {ACTIONS;DENIES/REPORTS:21021675::Denies} Difficulty getting out of chair: {ACTIONS;DENIES/REPORTS:21021675::Denies} Difficulty using hands for taps, buttons, cutlery, and/or writing: {ACTIONS;DENIES/REPORTS:21021675::Denies}  No Rheumatology ROS completed.   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) 10/06/2015   Peripheral neuropathy due to chemotherapy 10/06/2015   Antineoplastic chemotherapy induced anemia 08/27/2015   Breast cancer of lower-inner quadrant of left female breast (HCC) 07/03/2015   Left carotid artery stenosis    Psoriasis     Hormone replacement therapy    Smoker    Episcleritis     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 ,grade 1   COPD (chronic obstructive pulmonary disease) (HCC)    Episcleritis    Hormone replacement therapy    Hyperlipidemia    Hypertension    Left carotid artery stenosis    40-59% (01/2015)   Macular degeneration    Personal history of chemotherapy 2017   neoadjuvant   Personal history of radiation therapy 2018   Psoriasis    SIADH (syndrome of inappropriate ADH production)    Smoker     Family History  Problem Relation Age of Onset   Stroke Mother    Colon polyps Sister    Colon cancer Brother    Esophageal cancer Neg Hx    Rectal cancer Neg Hx    Stomach cancer Neg Hx    Breast cancer Neg Hx    Past Surgical History:  Procedure Laterality Date   ABDOMINAL HYSTERECTOMY     BREAST BIOPSY Left 06/2015   BREAST LUMPECTOMY Left 01/2016   BREAST LUMPECTOMY WITH RADIOACTIVE SEED AND SENTINEL LYMPH NODE BIOPSY Left 01/08/2016   Procedure: LEFT BREAST LUMPECTOMY WITH RADIOACTIVE SEED AND SENTINEL LYMPH NODE BIOPSY, BLUE DYE INJECTION;  Surgeon: Donnice Bury, MD;  Location: [facility];  Service: General;  Laterality: Left;   cervical acdf     C5-6- fusion with plates/screws Dr. Alix '12   COLONOSCOPY WITH PROPOFOL  N/A 04/16/2014   Procedure: COLONOSCOPY  WITH PROPOFOL ;  Surgeon: Gladis MARLA Louder, MD;  Location: [facility];  Service: Endoscopy;  Laterality: N/A;   COLPOSCOPY     laporotomy     Ovarian cyst   PORT-A-CATH REMOVAL Right 01/08/2016   Procedure: REMOVAL PORT-A-CATH;  Surgeon: Donnice Bury, MD;  Location: [facility];  Service: General;  Laterality: Right;   PORTACATH PLACEMENT N/A 07/24/2015   Procedure: INSERTION PORT-A-CATH WITH US ;  Surgeon: Donnice Bury, MD;   Location: [facility] OR;  Service: General;  Laterality: N/A;   ROBOTIC ASSISTED SALPINGO OOPHERECTOMY Bilateral 10/10/2018   Procedure: XI ROBOTIC ASSISTED SALPINGO OOPHORECTOMY;  Surgeon: Eloy Herring, MD;  Location: [facility];  Service: Gynecology;  Laterality: Bilateral;   TONSILLECTOMY     child   Social History   Tobacco Use   Smoking status: Every Day    Current packs/day: 0.50    Average packs/day: 0.5 packs/day for 20.0 years (10.0 ttl pk-yrs)    Types: Cigarettes    Passive exposure: Current   Smokeless tobacco: Never  Vaping Use   Vaping status: Some Days  Substance Use Topics   Alcohol use: Yes    Alcohol/week: 10.0 standard drinks of alcohol    Types: 10 Glasses of wine per week    Comment: Glasses of wine several times a week   Drug use: Yes    Types: Marijuana    Comment: 1 monthly   Social History   Social History Narrative   Married x 25 years.   Family lives nearby.     Immunization History  Administered Date(s) Administered   Influenza Split 11/04/2012   Influenza, Seasonal, Injecte, Preservative Fre 09/16/2022   Influenza,inj,Quad PF,6+ Mos 10/25/2016, 09/20/2018   Influenza-Unspecified 11/04/2013   MODERNA COVID-19 SARS-COV-2 PEDS BIVALENT BOOSTER 51yr-1yr 03/27/2019, 04/24/2019   Pneumococcal Polysaccharide-23 10/25/2016   Tdap 07/04/2009, 07/01/2023   Zoster Recombinant(Shingrix) 07/01/2023     Objective: Vital Signs: There were no vitals taken for this visit.   Physical Exam Vitals and nursing note reviewed.  Constitutional:      Appearance: She is well-developed.  HENT:     Head: Normocephalic and atraumatic.  Eyes:     Conjunctiva/sclera: Conjunctivae normal.  Cardiovascular:     Rate and Rhythm: Normal rate and regular rhythm.     Heart sounds: Normal heart sounds.  Pulmonary:     Effort: Pulmonary effort is normal.     Breath sounds: Normal breath sounds.  Abdominal:     General: Bowel sounds are normal.     Palpations: Abdomen is soft.   Musculoskeletal:     Cervical back: Normal range of motion.  Lymphadenopathy:     Cervical: No cervical adenopathy.  Skin:    General: Skin is warm and dry.     Capillary Refill: Capillary refill takes less than 2 seconds.  Neurological:     Mental Status: She is alert and oriented to person, place, and time.  Psychiatric:        Behavior: Behavior normal.      Musculoskeletal Exam: ***  CDAI Exam: CDAI Score: -- Patient Global: --; Provider Global: -- Swollen: --; Tender: -- Joint Exam 12/07/2023   No joint exam has been documented for this visit   There is currently no information documented on the homunculus. Go to the Rheumatology activity and complete the homunculus joint exam.  Investigation: No additional findings.  Imaging: No results found.  Recent Labs: Lab Results  Component Value Date   WBC 7.7 10/27/2023  HGB 12.3 10/27/2023   PLT 387 10/27/2023   NA 134 (L) 10/27/2023   K 4.5 10/27/2023   CL 98 10/27/2023   CO2 28 10/27/2023   GLUCOSE 111 (H) 10/27/2023   BUN 11 10/27/2023   CREATININE 0.66 10/27/2023   BILITOT 0.4 10/27/2023   ALKPHOS 77 03/12/2019   AST 32 10/27/2023   ALT 27 10/27/2023   PROT 7.1 10/27/2023   ALBUMIN 4.2 03/12/2019   CALCIUM  9.5 10/27/2023   GFRAA 100 06/17/2020   QFTBGOLDPLUS NEGATIVE 10/27/2023    Speciality Comments: Tremfya stopped 11/15/2022, Taltz  started 12/13/2022; Cosentyx  started 09/14/2023  Procedures:  No procedures performed Allergies: Patient has no known allergies.   Assessment / Plan:     Visit Diagnoses: Psoriatic arthropathy (HCC)  Psoriasis  High risk medication use  Rheumatoid factor positive  Episcleritis of left eye  Swelling of left wrist  Pain in both hands  S/P cervical spinal fusion  DDD (degenerative disc disease), cervical  Spondylosis of lumbar spine  Trochanteric bursitis of both hips  Malignant neoplasm of lower-inner quadrant of left breast in female, estrogen  receptor positive (HCC)  Antineoplastic chemotherapy induced anemia  Chemotherapy-induced peripheral neuropathy  Left carotid artery stenosis  Hormone replacement therapy  SIADH (syndrome of inappropriate ADH production)  Smoker  Orders: No orders of the defined types were placed in this encounter.  No orders of the defined types were placed in this encounter.   Face-to-face time spent with patient was *** minutes. Greater than 50% of time was spent in counseling and coordination of care.  Follow-Up Instructions: No follow-ups on file.   Waddell CHRISTELLA Craze, PA-C  Note - This record has been created using Dragon software.  Chart creation errors have been sought, but may not always  have been located. Such creation errors do not reflect on  the standard of medical care.

## 2023-12-02 ENCOUNTER — Other Ambulatory Visit: Payer: Self-pay

## 2023-12-05 ENCOUNTER — Ambulatory Visit: Admitting: Family Medicine

## 2023-12-05 ENCOUNTER — Encounter: Payer: Self-pay | Admitting: Family Medicine

## 2023-12-05 VITALS — BP 114/70 | HR 84 | Temp 98.6°F | Ht 64.0 in | Wt 111.0 lb

## 2023-12-05 DIAGNOSIS — I10 Essential (primary) hypertension: Secondary | ICD-10-CM | POA: Diagnosis not present

## 2023-12-05 LAB — COMPREHENSIVE METABOLIC PANEL WITH GFR
AG Ratio: 1.3 (calc) (ref 1.0–2.5)
ALT: 42 U/L — ABNORMAL HIGH (ref 6–29)
AST: 57 U/L — ABNORMAL HIGH (ref 10–35)
Albumin: 4 g/dL (ref 3.6–5.1)
Alkaline phosphatase (APISO): 158 U/L — ABNORMAL HIGH (ref 37–153)
BUN: 11 mg/dL (ref 7–25)
CO2: 30 mmol/L (ref 20–32)
Calcium: 9.9 mg/dL (ref 8.6–10.4)
Chloride: 95 mmol/L — ABNORMAL LOW (ref 98–110)
Creat: 0.79 mg/dL (ref 0.50–1.05)
Globulin: 3.2 g/dL (ref 1.9–3.7)
Glucose, Bld: 102 mg/dL — ABNORMAL HIGH (ref 65–99)
Potassium: 4.4 mmol/L (ref 3.5–5.3)
Sodium: 134 mmol/L — ABNORMAL LOW (ref 135–146)
Total Bilirubin: 0.5 mg/dL (ref 0.2–1.2)
Total Protein: 7.2 g/dL (ref 6.1–8.1)
eGFR: 81 mL/min/1.73m2 (ref >=60)

## 2023-12-05 LAB — LIPID PANEL
Cholesterol: 173 mg/dL (ref <200)
HDL: 98 mg/dL (ref >=50)
LDL Cholesterol (Calc): 56 mg/dL
Non-HDL Cholesterol (Calc): 75 mg/dL (ref <130)
Total CHOL/HDL Ratio: 1.8 (calc) (ref <5.0)
Triglycerides: 105 mg/dL (ref <150)

## 2023-12-05 NOTE — Progress Notes (Signed)
 Subjective:    Patient ID: Kim Ortega, female    DOB: [birth year], 68 y.o.   MRN: [id number] Patient has psoriatic arthritis and is currently seeing rheumatology.  She is here today for a checkup related to her hypertension.  She has a history of breast cancer as well as smoking.  She also has a history of SIADH.  I am giving her 30 hydrocodone  a month for chronic low back pain.  She is not currently taking any NSAID.  She is taking Cosentyx  but is planning to discontinue this due to diarrhea.  Therefore today's visit was primarily to focus on her preventative care as my role as her PCP.  Her mammogram is up-to-date.  Colonoscopy was performed in 2024 and is up-to-date.  She is due for lung cancer screening.  She is also due for COVID vaccination, RSV vaccination, and also wanted to discuss the new pneumonia vaccine, capvaxive, with the patient.  However, as I discussed these with the patient, she became very upset and stated that she did not want to have all these tests.  She states that she has no desire to quit smoking.  She does not want to know if there is lung cancer.  I tried to explain that I was simply wanting to discuss these test with her as this is my responsibility as her primary care physician.  However, the patient was very upset so I stopped the discussion.  Her blood pressure today is excellent at 114/70.  She is due for fasting labs to check her cholesterol 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 ,grade 1   COPD (chronic obstructive pulmonary disease) (HCC)    Episcleritis    Hormone replacement therapy    Hyperlipidemia    Hypertension    Left carotid artery stenosis    40-59% (01/2015)   Macular degeneration    Macular degeneration, age related    Personal history of chemotherapy 2017   neoadjuvant   Personal history  of radiation therapy 2018   Psoriasis    SIADH (syndrome of inappropriate ADH production)    Smoker    Past Surgical History:  Procedure Laterality Date   ABDOMINAL HYSTERECTOMY     BREAST BIOPSY Left 06/2015   BREAST LUMPECTOMY Left 01/2016   BREAST LUMPECTOMY WITH RADIOACTIVE SEED AND SENTINEL LYMPH NODE BIOPSY Left 01/08/2016   Procedure: LEFT BREAST LUMPECTOMY WITH RADIOACTIVE SEED AND SENTINEL LYMPH NODE BIOPSY, BLUE DYE INJECTION;  Surgeon: Donnice Bury, MD;  Location: [facility];  Service: General;  Laterality: Left;   cervical acdf     C5-6- fusion with plates/screws Dr. Alix '12   COLONOSCOPY WITH PROPOFOL  N/A 04/16/2014   Procedure: COLONOSCOPY WITH PROPOFOL ;  Surgeon: Gladis MARLA Louder, MD;  Location: [facility];  Service: Endoscopy;  Laterality: N/A;   COLPOSCOPY     laporotomy     Ovarian cyst   PORT-A-CATH REMOVAL Right 01/08/2016   Procedure: REMOVAL PORT-A-CATH;  Surgeon: Donnice Bury, MD;  Location: [facility];  Service: General;  Laterality: Right;   PORTACATH PLACEMENT N/A 07/24/2015   Procedure: INSERTION PORT-A-CATH WITH US ;  Surgeon: Donnice Bury, MD;  Location: [facility];  Service: General;  Laterality: N/A;   ROBOTIC ASSISTED SALPINGO OOPHERECTOMY Bilateral 10/10/2018   Procedure: XI ROBOTIC  ASSISTED SALPINGO OOPHORECTOMY;  Surgeon: Eloy Herring, MD;  Location: [facility];  Service: Gynecology;  Laterality: Bilateral;   TONSILLECTOMY     child   No Known Allergies Current Outpatient Medications on File Prior to Visit  Medication Sig Dispense Refill   ALPRAZolam  (XANAX ) 0.5 MG tablet Take 0.5 mg by mouth 3 (three) times daily as needed for anxiety.     amLODipine  (NORVASC ) 5 MG tablet TAKE 1 TABLET (5 MG TOTAL) BY MOUTH DAILY. 90 tablet 3   atenolol  (TENORMIN ) 50 MG tablet TAKE 1 TABLET BY MOUTH DAILY. * NEEDS APPT FOR REFILLS 90 tablet 1   atorvastatin  (LIPITOR) 40 MG tablet Take 1 tablet (40 mg total) by mouth daily. 90  tablet 0   Calcium  Carbonate-Vitamin D (CALCIUM -D PO) Take 1 tablet by mouth 2 (two) times daily.      clobetasol  (OLUX ) 0.05 % topical foam Apply topically.     cyclobenzaprine  (FLEXERIL ) 10 MG tablet TAKE 1 TABLET BY MOUTH THREE TIMES A DAY AS NEEDED FOR MUSCLE SPASM 30 tablet 0   fluticasone  (FLONASE ) 50 MCG/ACT nasal spray SPRAY 2 SPRAYS INTO EACH NOSTRIL EVERY DAY 48 mL 2   HYDROcodone -acetaminophen  (NORCO) 5-325 MG tablet Take 1 tablet by mouth every 8 (eight) hours as needed for moderate pain (pain score 4-6). 30 tablet 0   Ketotifen Fumarate (ALLERGY EYE DROPS OP) Place 1 drop into both eyes 2 (two) times daily as needed (allergies).     losartan  (COZAAR ) 50 MG tablet TAKE 1 TABLET (50 MG) BY MOUTH IN THE MORNING AND AT BEDTIME 180 tablet 1   Multiple Vitamin (MULTIVITAMIN WITH MINERALS) TABS tablet Take 1 tablet by mouth daily.     triamcinolone  cream (KENALOG ) 0.1 % 1 Application.     venlafaxine  XR (EFFEXOR -XR) 37.5 MG 24 hr capsule Take 1 capsule (37.5 mg total) by mouth daily with breakfast. 90 capsule 3   aspirin EC 81 MG tablet Take 81 mg by mouth daily. (Patient not taking: Reported on 12/05/2023)     folic acid  (FOLVITE ) 1 MG tablet TAKE 2 TABLETS BY MOUTH EVERY DAY (Patient not taking: Reported on 12/05/2023) 180 tablet 3   ibuprofen  (ADVIL ) 600 MG tablet Take 1 tablet (600 mg total) by mouth every 6 (six) hours as needed for moderate pain. For AFTER surgery only (Patient not taking: Reported on 12/05/2023) 30 tablet 0   methotrexate  50 MG/2ML injection Inject 0.6 mLs (15 mg total) into the skin once a week. (Patient not taking: Reported on 12/05/2023) 8 mL 0   Secukinumab  (COSENTYX  UNOREADY) 300 MG/2ML SOAJ Inject 300 mg into the skin once a week. at [facility] 1, 2, 3 (Week 0 dose completed in office on 09/14/2023 with sample) (Patient not taking: Reported on 12/05/2023) 6 mL 0   Secukinumab  (COSENTYX  UNOREADY) 300 MG/2ML SOAJ Inject 300 mg into the skin every 28 (twenty-eight) days.  (Patient not taking: Reported on 12/05/2023) 2 mL 2   TUBERCULIN SYR 1CC/27GX1/2 (B-D TB SYRINGE 1CC/27GX1/2) 27G X 1/2 1 ML MISC 12 Syringes by Does not apply route once a week. (Patient not taking: Reported on 12/05/2023) 12 each 3   No current facility-administered medications on file prior to visit.   Social History   Socioeconomic History   Marital status: Married    Spouse name: Micheal   Number of children: 0   Years of education: Not on file   Highest education level: Not on file  Occupational History   Not on file  Tobacco Use  Smoking status: Every Day    Current packs/day: 0.50    Average packs/day: 0.5 packs/day for 20.0 years (10.0 ttl pk-yrs)    Types: Cigarettes    Passive exposure: Current   Smokeless tobacco: Never  Vaping Use   Vaping status: Some Days  Substance and Sexual Activity   Alcohol use: Yes    Alcohol/week: 10.0 standard drinks of alcohol    Types: 10 Glasses of wine per week    Comment: Glasses of wine several times a week   Drug use: Yes    Types: Marijuana    Comment: 1 monthly   Sexual activity: Yes    Birth control/protection: Surgical    Comment: married  Other Topics Concern   Not on file  Social History Narrative   Married x 25 years.   Family lives nearby.   Social Drivers of Corporate Investment Banker Strain: Low Risk  (10/05/2023)   Overall Financial Resource Strain (CARDIA)    Difficulty of Paying Living Expenses: Not hard at all  Food Insecurity: No Food Insecurity (10/05/2023)   Hunger Vital Sign    Worried About Running Out of Food in the Last Year: Never true    Ran Out of Food in the Last Year: Never true  Transportation Needs: No Transportation Needs (10/05/2023)   PRAPARE - Administrator, Civil Service (Medical): No    Lack of Transportation (Non-Medical): No  Physical Activity: Insufficiently Active (10/05/2023)   Exercise Vital Sign    Days of Exercise per Week: 3 days    Minutes of Exercise per  Session: 30 min  Stress: No Stress Concern Present (10/05/2023)   [employer] of Occupational Health - Occupational Stress Questionnaire    Feeling of Stress: Not at all  Social Connections: Socially Integrated (10/05/2023)   Social Connection and Isolation Panel    Frequency of Communication with Friends and Family: More than three times a week    Frequency of Social Gatherings with Friends and Family: Three times a week    Attends Religious Services: More than 4 times per year    Active Member of Clubs or Organizations: Yes    Attends Banker Meetings: More than 4 times per year    Marital Status: Married  Catering Manager Violence: Not At Risk (10/05/2023)   Humiliation, Afraid, Rape, and Kick questionnaire    Fear of Current or Ex-Partner: No    Emotionally Abused: No    Physically Abused: No    Sexually Abused: No     Review of Systems  Gastrointestinal:  Positive for diarrhea.  Musculoskeletal:  Positive for back pain.  All other systems reviewed and are negative.      Objective:   Physical Exam Vitals reviewed.  Constitutional:      General: She is not in acute distress.    Appearance: She is well-developed. She is not diaphoretic.  HENT:     Nose: No mucosal edema.     Right Sinus: No maxillary sinus tenderness.     Left Sinus: No maxillary sinus tenderness.  Cardiovascular:     Rate and Rhythm: Normal rate and regular rhythm.     Heart sounds: Normal heart sounds. No murmur heard. Pulmonary:     Effort: Pulmonary effort is normal. No respiratory distress.     Breath sounds: Normal breath sounds. No wheezing or rales.  Skin:    Findings: No erythema.  Neurological:     Mental Status: She is alert.  Motor: No abnormal muscle tone.           Assessment & Plan:  Benign essential HTN - Plan: Comprehensive metabolic panel with GFR, Lipid panel Blood pressure today is acceptable.  I will check a fasting lipid panel.  I would like to see  her LDL cholesterol less than 899.  I wanted to discuss CAPVAXIVE along with RSV and COVID vaccinations with the patient.  However she became very upset with me for discussing this in addition to screening for lung cancer.  Therefore, I will not order the CT scan to screen for lung cancer and I stopped her discussion regarding vaccinations as apparently this was upsetting the patient.

## 2023-12-06 ENCOUNTER — Other Ambulatory Visit: Payer: Self-pay | Admitting: Family Medicine

## 2023-12-06 ENCOUNTER — Other Ambulatory Visit (HOSPITAL_COMMUNITY): Payer: Self-pay

## 2023-12-06 ENCOUNTER — Ambulatory Visit: Payer: Self-pay | Admitting: Family Medicine

## 2023-12-06 ENCOUNTER — Other Ambulatory Visit: Payer: Self-pay

## 2023-12-06 DIAGNOSIS — Z122 Encounter for screening for malignant neoplasm of respiratory organs: Secondary | ICD-10-CM

## 2023-12-07 ENCOUNTER — Telehealth: Payer: Self-pay

## 2023-12-07 ENCOUNTER — Ambulatory Visit: Attending: Physician Assistant | Admitting: Physician Assistant

## 2023-12-07 ENCOUNTER — Encounter: Payer: Self-pay | Admitting: Physician Assistant

## 2023-12-07 VITALS — BP 124/76 | HR 74 | Temp 97.7°F | Resp 17 | Ht 64.0 in | Wt 111.8 lb

## 2023-12-07 DIAGNOSIS — C50312 Malignant neoplasm of lower-inner quadrant of left female breast: Secondary | ICD-10-CM

## 2023-12-07 DIAGNOSIS — H15102 Unspecified episcleritis, left eye: Secondary | ICD-10-CM | POA: Diagnosis not present

## 2023-12-07 DIAGNOSIS — Z981 Arthrodesis status: Secondary | ICD-10-CM | POA: Diagnosis not present

## 2023-12-07 DIAGNOSIS — Z170 Estrogen receptor positive status [ER+]: Secondary | ICD-10-CM

## 2023-12-07 DIAGNOSIS — R7689 Other specified abnormal immunological findings in serum: Secondary | ICD-10-CM

## 2023-12-07 DIAGNOSIS — M47816 Spondylosis without myelopathy or radiculopathy, lumbar region: Secondary | ICD-10-CM

## 2023-12-07 DIAGNOSIS — L4050 Arthropathic psoriasis, unspecified: Secondary | ICD-10-CM

## 2023-12-07 DIAGNOSIS — G620 Drug-induced polyneuropathy: Secondary | ICD-10-CM

## 2023-12-07 DIAGNOSIS — D6481 Anemia due to antineoplastic chemotherapy: Secondary | ICD-10-CM

## 2023-12-07 DIAGNOSIS — L409 Psoriasis, unspecified: Secondary | ICD-10-CM

## 2023-12-07 DIAGNOSIS — M5030 Other cervical disc degeneration, unspecified cervical region: Secondary | ICD-10-CM

## 2023-12-07 DIAGNOSIS — M79641 Pain in right hand: Secondary | ICD-10-CM | POA: Diagnosis not present

## 2023-12-07 DIAGNOSIS — Z79899 Other long term (current) drug therapy: Secondary | ICD-10-CM

## 2023-12-07 DIAGNOSIS — T451X5A Adverse effect of antineoplastic and immunosuppressive drugs, initial encounter: Secondary | ICD-10-CM

## 2023-12-07 DIAGNOSIS — M25432 Effusion, left wrist: Secondary | ICD-10-CM | POA: Diagnosis not present

## 2023-12-07 DIAGNOSIS — F17200 Nicotine dependence, unspecified, uncomplicated: Secondary | ICD-10-CM

## 2023-12-07 DIAGNOSIS — Z79890 Hormone replacement therapy (postmenopausal): Secondary | ICD-10-CM

## 2023-12-07 DIAGNOSIS — M7061 Trochanteric bursitis, right hip: Secondary | ICD-10-CM

## 2023-12-07 DIAGNOSIS — E222 Syndrome of inappropriate secretion of antidiuretic hormone: Secondary | ICD-10-CM

## 2023-12-07 DIAGNOSIS — M7062 Trochanteric bursitis, left hip: Secondary | ICD-10-CM

## 2023-12-07 DIAGNOSIS — I6522 Occlusion and stenosis of left carotid artery: Secondary | ICD-10-CM

## 2023-12-07 DIAGNOSIS — M79642 Pain in left hand: Secondary | ICD-10-CM

## 2023-12-07 NOTE — Patient Instructions (Signed)
 Standing Labs We placed an order today for your standing lab work.   Please have your standing labs drawn in 1 month and then every 3 months  Please have your labs drawn 2 weeks prior to your appointment so that the provider can discuss your lab results at your appointment, if possible.  Please note that you may see your imaging and lab results in MyChart before we have reviewed them. We will contact you once all results are reviewed. Please allow our office up to 72 hours to thoroughly review all of the results before contacting the office for clarification of your results.  WALK-IN LAB HOURS  Monday through Thursday from 8:00 am - 4:30 pm and Friday from 8:00 am-12:00 pm.  Patients with office visits requiring labs will be seen before walk-in labs.  You may encounter longer than normal wait times. Please allow additional time. Wait times may be shorter on  Monday and Thursday afternoons.  We do not book appointments for walk-in labs. We appreciate your patience and understanding with our staff.   Labs are drawn by [facility]. Please bring your co-pay at the time of your lab draw.  You may receive a bill from [facility] for your lab work.  Please note if you are on Hydroxychloroquine and and an order has been placed for a Hydroxychloroquine level,  you will need to have it drawn 4 hours or more after your last dose.  If you wish to have your labs drawn at another location, please call the office 24 hours in advance so we can fax the orders.  The office is located at [address], [place], [place] [address]   If you have any questions regarding directions or hours of operation,  please call [phone].   As a reminder, please drink plenty of water prior to coming for your lab work. Thanks!       Risankizumab Injection What is this medication? RISANKIZUMAB (RIS an KIZ ue mab) treats autoimmune conditions, such as psoriasis, arthritis, Crohn disease, and ulcerative colitis. It  works by slowing down an overactive immune system.  It is a monoclonal antibody. This medicine may be used for other purposes; ask your health care provider or pharmacist if you have questions. COMMON BRAND NAME(S): Skyrizi What should I tell my care team before I take this medication? They need to know if you have any of these conditions: Hepatic disease Immune system problems Infection, such as tuberculosis (TB), bacterial, fungal or viral infections Recent or upcoming vaccine An unusual or allergic reaction to risankizumab, other medications, foods, dyes, or preservatives Pregnant or trying to get pregnant Breast-feeding How should I use this medication? This medication is injected into a vein or under the skin. It is given by your care team in a hospital or clinic setting. It may also be given at home. If you get this medication at home, you will be taught how to prepare and give it. Use exactly as directed. Take it as directed on the prescription label. Keep taking it unless your care team tells you to stop. If you use a pen, be sure to take off the outer needle cover before using the dose. It is important that you put your used needles and syringes in a special sharps container. Do not put them in a trash can. If you do not have a sharps container, call your pharmacist or care team to get one. A special MedGuide will be given to you by the pharmacist with  each prescription and refill. Be sure to read this information carefully each time. This medication comes with INSTRUCTIONS FOR USE. Ask your pharmacist for directions on how to use this medication. Read the information carefully. Talk to your pharmacist or care team if you have questions. Talk to your care team about the use of this medication in children. Special care may be needed. Overdosage: If you think you have taken too much of this medicine contact a poison control center or emergency room at once. NOTE: This medicine is only for  you. Do not share this medicine with others. What if I miss a dose? It is important not to miss any doses. Talk to your care team about what to do if you miss a dose. What may interact with this medication? Do not take this medication with any of the following: Live vaccines This list may not describe all possible interactions. Give your health care provider a list of all the medicines, herbs, non-prescription drugs, or dietary supplements you use. Also tell them if you smoke, drink alcohol, or use illegal drugs. Some items may interact with your medicine. What should I watch for while using this medication? Visit your care team for regular checks on your progress. Tell your care team if your symptoms do not start to get better or if they get worse. You will be tested for tuberculosis (TB) before you start this medication. If your care team prescribes any medication for TB, you should start taking the TB medication before starting this medication. Make sure to finish the full course of TB medication. This medication may increase your risk of getting an infection. Call your care team for advice if you get a fever, chills, sore throat, or other symptoms of a cold or flu. Do not treat yourself. Try to avoid being around people who are sick. This medication can decrease the response to a vaccine. If you need to get vaccinated, tell your care team if you have received this medication. Extra booster doses may be needed. Talk to your care team to see if a different vaccination schedule is needed. What side effects may I notice from receiving this medication? Side effects that you should report to your care team as soon as possible: Allergic reactions--skin rash, itching, hives, swelling of the face, lips, tongue, or throat Infection--fever, chills, cough, sore throat, wounds that don't heal, pain or trouble when passing urine, general feeling of discomfort or being unwell Liver injury--right upper belly  pain, loss of appetite, nausea, light-colored stool, dark yellow or brown urine, yellowing skin or eyes, unusual weakness or fatigue Side effects that usually do not require medical attention (report to your care team if they continue or are bothersome): Fatigue Headache Pain, redness, or irritation at injection site Runny or stuffy nose Sore throat This list may not describe all possible side effects. Call your doctor for medical advice about side effects. You may report side effects to FDA at 1-800-FDA-1088. Where should I keep my medication? Keep out of the reach of children and pets. Store in a refrigerator. Do not freeze. Protect from light. Keep it in the original carton until you are ready to take it. See product for storage information. Each product may have different instructions. Remove the dose from the carton about 30 to 45 minutes before it is time for you to take it. Get rid of any unused medication after the expiration date. To get rid of medications that are no longer needed or have expired:  Take the medication to a medication take-back program. Check with your pharmacy or law enforcement to find a location. If you cannot return the medication, ask your pharmacist or care team how to get rid of this medication safely. NOTE: This sheet is a summary. It may not cover all possible information. If you have questions about this medicine, talk to your doctor, pharmacist, or health care provider.  2024 Elsevier/Gold Standard (2022-12-03 00:00:00)

## 2023-12-07 NOTE — Telephone Encounter (Signed)
 Received notification that pt will be transitioning from Taltz  to Skyrizi. Will begin PA process once OV note from today has been completed.

## 2023-12-09 ENCOUNTER — Other Ambulatory Visit (HOSPITAL_COMMUNITY): Payer: Self-pay

## 2023-12-09 MED ORDER — SKYRIZI PEN 150 MG/ML ~~LOC~~ SOAJ
SUBCUTANEOUS | 0 refills | Status: AC
  Filled 2023-12-12: qty 1, fill #0
  Filled 2024-01-03 (×3): qty 1, 84d supply, fill #0
  Filled 2024-01-30: qty 1, 28d supply, fill #0

## 2023-12-09 MED ORDER — SKYRIZI PEN 150 MG/ML ~~LOC~~ SOAJ
SUBCUTANEOUS | 0 refills | Status: AC
  Filled 2023-12-12: qty 1, 28d supply, fill #0

## 2023-12-09 NOTE — Telephone Encounter (Signed)
 Submitted a Prior Authorization request to [employer] for SKYRIZI  SQ via CoverMyMeds. Will update once we receive a response.  Key: [id number]   Authorization already on file for this request.  Copay is $0   Rx sent to [facility] for onboarding. Last Cosentyx  dose was 11/12/23 so okay to start Skyrizi  once received  Marjarie Irion, PharmD, MPH, BCPS, CPP Clinical Pharmacist [facility])

## 2023-12-12 ENCOUNTER — Other Ambulatory Visit: Payer: Self-pay

## 2023-12-12 NOTE — Progress Notes (Signed)
 Specialty Pharmacy Initial Fill Coordination Note  Kim Ortega is a 68 y.o. female contacted today regarding initial fill of specialty medication(s) Risankizumab -rzaa (Skyrizi  Pen)   Patient requested Delivery   Delivery date: 12/14/23   Verified address: [address]   [place] [address]   Medication will be filled on: 12/13/23   Patient is aware of $0 copayment.

## 2023-12-13 ENCOUNTER — Other Ambulatory Visit: Payer: Self-pay

## 2023-12-13 ENCOUNTER — Other Ambulatory Visit (HOSPITAL_COMMUNITY): Payer: Self-pay

## 2023-12-14 ENCOUNTER — Inpatient Hospital Stay: Admission: RE | Admit: 2023-12-14 | Discharge: 2023-12-14 | Attending: Family Medicine | Admitting: Family Medicine

## 2023-12-14 ENCOUNTER — Other Ambulatory Visit: Payer: Self-pay

## 2023-12-14 DIAGNOSIS — Z122 Encounter for screening for malignant neoplasm of respiratory organs: Secondary | ICD-10-CM

## 2023-12-14 NOTE — Telephone Encounter (Signed)
 Prescription Request  12/14/2023  LOV: 12/05/23  What is the name of the medication or equipment? losartan  (COZAAR ) 50 MG tablet [[id number]]   Have you contacted your pharmacy to request a refill? Yes   Which pharmacy would you like this sent to?  [id number] [address], [place] - [address] [place] [address] Phone: [phone] Fax: [phone]    Patient notified that their request is being sent to the clinical staff for review and that they should receive a response within 2 business days.   Please advise at [facility] [phone]

## 2023-12-15 ENCOUNTER — Other Ambulatory Visit: Payer: Self-pay

## 2023-12-15 ENCOUNTER — Encounter: Payer: Self-pay | Admitting: Family Medicine

## 2023-12-15 MED ORDER — HYDROCODONE-ACETAMINOPHEN 5-325 MG PO TABS: 1.0000 | ORAL_TABLET | Freq: Three times a day (TID) | ORAL | 0 refills | Status: DC | PRN

## 2023-12-15 MED ORDER — CYCLOBENZAPRINE HCL 10 MG PO TABS: ORAL_TABLET | ORAL | 0 refills | Status: AC

## 2023-12-15 NOTE — Progress Notes (Signed)
 Patient changing from Cosentyx  to Skyrzi SQ for  PsA+PsO. Her dermatologist wanted switch. Patient aware that we will need to provide at least 6 months of treatment to gauge response.  Dose will be 150mg  subcut at Week 0, Week 4, then every 12 weeks thereafter  Savir Blanke, PharmD, MPH, BCPS, CPP Clinical Pharmacist

## 2023-12-16 MED ORDER — LOSARTAN POTASSIUM 50 MG PO TABS: ORAL_TABLET | ORAL | 0 refills | Status: AC

## 2023-12-16 NOTE — Telephone Encounter (Signed)
 Requested Prescriptions  Pending Prescriptions Disp Refills   losartan  (COZAAR ) 50 MG tablet 180 tablet 0    Sig: TAKE 1 TABLET (50 MG) BY MOUTH IN THE MORNING AND AT BEDTIME     Cardiovascular:  Angiotensin Receptor Blockers Passed - 12/16/2023  8:28 AM      Passed - Cr in normal range and within 180 days    Creatinine  Date Value Ref Range Status  05/20/2016 0.8 0.6 - 1.1 mg/dL Final   Creat  Date Value Ref Range Status  12/05/2023 0.79 0.50 - 1.05 mg/dL Final         Passed - K in normal range and within 180 days    Potassium  Date Value Ref Range Status  12/05/2023 4.4 3.5 - 5.3 mmol/L Final  05/20/2016 3.8 3.5 - 5.1 mEq/L Final         Passed - Patient is not pregnant      Passed - Last BP in normal range    BP Readings from Last 1 Encounters:  12/07/23 124/76         Passed - Valid encounter within last 6 months    Recent Outpatient Visits           1 week ago Benign essential HTN   [facility] Duanne Butler DASEN, MD   8 months ago Screening for lung cancer   [place] [facility] Duanne Butler DASEN, MD   1 year ago Right hand pain   [place] [facility] Duanne Butler DASEN, MD   1 year ago Right hand pain   [facility] Kayla Jeoffrey RAMAN, FNP   1 year ago Benign essential HTN   [place] [facility] Pickard, Butler DASEN, MD       Future Appointments             In 1 month Cheryl Waddell CHRISTELLA RIGGERS [facility] - A Dept Of [place]

## 2023-12-19 ENCOUNTER — Ambulatory Visit: Payer: Self-pay | Admitting: Family Medicine

## 2023-12-22 ENCOUNTER — Telehealth: Payer: Self-pay

## 2023-12-22 NOTE — Telephone Encounter (Signed)
 Patient contacted the office requesting a note that will dismiss her from [employer]. Patient states with her condition, she does not feel she should do [employer]. Please advise if it is okay to write a note for the patient to be dismissed from [employer]. If needed, patient's juror # is [phone] and her [place] date is 01/30/2024.

## 2023-12-22 NOTE — Telephone Encounter (Signed)
 Letter completed and place up front for patient to pick up. Patient has been advised to pick up her letter from the front desk.

## 2023-12-22 NOTE — Progress Notes (Signed)
 Letter completed and place up front for patient to pick up. Patient has been advised to pick up her letter from the front desk.

## 2023-12-22 NOTE — Telephone Encounter (Signed)
 Ok to provide note

## 2023-12-26 ENCOUNTER — Other Ambulatory Visit: Payer: Self-pay

## 2023-12-26 ENCOUNTER — Telehealth: Payer: Self-pay

## 2023-12-26 DIAGNOSIS — I10 Essential (primary) hypertension: Secondary | ICD-10-CM

## 2023-12-26 MED ORDER — ATENOLOL 50 MG PO TABS: 50.0000 mg | ORAL_TABLET | Freq: Every day | ORAL | 1 refills | Status: AC

## 2023-12-26 NOTE — Telephone Encounter (Signed)
 Prescription Request  12/26/2023  LOV: 12/05/23  What is the name of the medication or equipment? atenolol  (TENORMIN ) 50 MG tablet [[id number]]   Have you contacted your pharmacy to request a refill? Yes   Which pharmacy would you like this sent to?  [id number] [address], [place] - [address] [place] [address] Phone: [phone] Fax: [phone]    Patient notified that their request is being sent to the clinical staff for review and that they should receive a response within 2 business days.   Please advise at [facility] [phone]

## 2023-12-26 NOTE — Telephone Encounter (Signed)
 Sent in medication

## 2023-12-30 ENCOUNTER — Telehealth: Payer: Self-pay | Admitting: *Deleted

## 2023-12-30 NOTE — Telephone Encounter (Signed)
 Patient needs assistance completing 2026 enrollment for Skyrizi . Thank you.

## 2024-01-02 NOTE — Telephone Encounter (Signed)
 Contacted pt, application will be mailed to her home address per request.

## 2024-01-03 ENCOUNTER — Other Ambulatory Visit (HOSPITAL_COMMUNITY): Payer: Self-pay

## 2024-01-03 ENCOUNTER — Other Ambulatory Visit: Payer: Self-pay

## 2024-01-04 ENCOUNTER — Other Ambulatory Visit: Payer: Self-pay | Admitting: Family Medicine

## 2024-01-06 ENCOUNTER — Telehealth: Payer: Self-pay

## 2024-01-06 NOTE — Telephone Encounter (Signed)
 PAP application has been drafted and supporting documentation has been compiled. Provider portion has been signed, pt portion has been mailed to pt's home address. Completed portions of application have been sent to [facility], submission is pending return of pt portion.

## 2024-01-09 ENCOUNTER — Other Ambulatory Visit: Payer: Self-pay

## 2024-01-09 ENCOUNTER — Encounter: Payer: Self-pay | Admitting: Family Medicine

## 2024-01-10 MED ORDER — HYDROCODONE-ACETAMINOPHEN 5-325 MG PO TABS: 1.0000 | ORAL_TABLET | Freq: Three times a day (TID) | ORAL | 0 refills | Status: DC | PRN

## 2024-01-12 ENCOUNTER — Other Ambulatory Visit: Payer: Self-pay

## 2024-01-23 NOTE — Progress Notes (Unsigned)
 "  Office Visit Note  Patient: Kim Ortega             Date of Birth: 1955/08/06           MRN: [id number]             PCP: Duanne Butler DASEN, MD Referring: Duanne Butler DASEN, MD Visit Date: 02/06/2024 Occupation: Data Unavailable  Subjective:  No chief complaint on file.   History of Present Illness: Kim Ortega is a 69 y.o. female ***     Activities of Daily Living:  Patient reports morning stiffness for *** {minute/hour:19697}.   Patient {ACTIONS;DENIES/REPORTS:21021675::Denies} nocturnal pain.  Difficulty dressing/grooming: {ACTIONS;DENIES/REPORTS:21021675::Denies} Difficulty climbing stairs: {ACTIONS;DENIES/REPORTS:21021675::Denies} Difficulty getting out of chair: {ACTIONS;DENIES/REPORTS:21021675::Denies} Difficulty using hands for taps, buttons, cutlery, and/or writing: {ACTIONS;DENIES/REPORTS:21021675::Denies}  No Rheumatology ROS completed.   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) 10/06/2015   Peripheral neuropathy due to chemotherapy 10/06/2015   Antineoplastic chemotherapy induced anemia 08/27/2015   Breast cancer of lower-inner quadrant of left female breast (HCC) 07/03/2015   Left carotid artery stenosis    Psoriasis    Hormone replacement therapy    Smoker    Episcleritis     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 ,grade 1   COPD (chronic obstructive pulmonary disease) (HCC)    Episcleritis    Hormone replacement therapy    Hyperlipidemia    Hypertension    Left carotid  artery stenosis    40-59% (01/2015)   Macular degeneration    Macular degeneration, age related    Personal history of chemotherapy 2017   neoadjuvant   Personal history of radiation therapy 2018   Psoriasis    SIADH (syndrome of inappropriate ADH production)    Smoker     Family History  Problem Relation Age of Onset   Stroke Mother    Colon polyps Sister    Colon cancer Brother    Esophageal cancer Neg Hx    Rectal cancer Neg Hx    Stomach cancer Neg Hx    Breast cancer Neg Hx    Past Surgical History:  Procedure Laterality Date   ABDOMINAL HYSTERECTOMY     BREAST BIOPSY Left 06/2015   BREAST LUMPECTOMY Left 01/2016   BREAST LUMPECTOMY WITH RADIOACTIVE SEED AND SENTINEL LYMPH NODE BIOPSY Left 01/08/2016   Procedure: LEFT BREAST LUMPECTOMY WITH RADIOACTIVE SEED AND SENTINEL LYMPH NODE BIOPSY, BLUE DYE INJECTION;  Surgeon: Donnice Bury, MD;  Location: [facility];  Service: General;  Laterality: Left;   cervical acdf     C5-6- fusion with plates/screws Dr. Alix '12   COLONOSCOPY WITH PROPOFOL  N/A 04/16/2014   Procedure: COLONOSCOPY WITH PROPOFOL ;  Surgeon: Gladis MARLA Louder, MD;  Location: [facility];  Service: Endoscopy;  Laterality: N/A;   COLPOSCOPY     laporotomy     Ovarian cyst   PORT-A-CATH REMOVAL Right 01/08/2016   Procedure: REMOVAL PORT-A-CATH;  Surgeon: Donnice Bury, MD;  Location: [facility];  Service:  General;  Laterality: Right;   PORTACATH PLACEMENT N/A 07/24/2015   Procedure: INSERTION PORT-A-CATH WITH US ;  Surgeon: Donnice Bury, MD;  Location: [facility];  Service: General;  Laterality: N/A;   ROBOTIC ASSISTED SALPINGO OOPHERECTOMY Bilateral 10/10/2018   Procedure: XI ROBOTIC ASSISTED SALPINGO OOPHORECTOMY;  Surgeon: Eloy Herring, MD;  Location: [facility];  Service: Gynecology;  Laterality: Bilateral;   TONSILLECTOMY     child   Social History[1] Social History   Social History Narrative   Married x 25 years.    Family lives nearby.     Immunization History  Administered Date(s) Administered   Influenza Split 11/04/2012   Influenza, Seasonal, Injecte, Preservative Fre 09/16/2022   Influenza,inj,Quad PF,6+ Mos 10/25/2016, 09/20/2018   Influenza-Unspecified 11/04/2013   MODERNA COVID-19 SARS-COV-2 PEDS BIVALENT BOOSTER 49yr-54yr 03/27/2019, 04/24/2019   Pneumococcal Polysaccharide-23 10/25/2016   Tdap 07/04/2009, 07/01/2023   Zoster Recombinant(Shingrix) 07/01/2023     Objective: Vital Signs: There were no vitals taken for this visit.   Physical Exam   Musculoskeletal Exam: ***  CDAI Exam: CDAI Score: -- Patient Global: --; Provider Global: -- Swollen: --; Tender: -- Joint Exam 02/06/2024   No joint exam has been documented for this visit   There is currently no information documented on the homunculus. Go to the Rheumatology activity and complete the homunculus joint exam.  Investigation: No additional findings.  Imaging: No results found.  Recent Labs: Lab Results  Component Value Date   WBC 7.7 10/27/2023   HGB 12.3 10/27/2023   PLT 387 10/27/2023   NA 134 (L) 12/05/2023   K 4.4 12/05/2023   CL 95 (L) 12/05/2023   CO2 30 12/05/2023   GLUCOSE 102 (H) 12/05/2023   BUN 11 12/05/2023   CREATININE 0.79 12/05/2023   BILITOT 0.5 12/05/2023   ALKPHOS 77 03/12/2019   AST 57 (H) 12/05/2023   ALT 42 (H) 12/05/2023   PROT 7.2 12/05/2023   ALBUMIN 4.2 03/12/2019   CALCIUM  9.9 12/05/2023   GFRAA 100 06/17/2020   QFTBGOLDPLUS NEGATIVE 10/27/2023    Speciality Comments: Tremfya stopped 11/15/2022, Taltz  started 12/13/2022; Cosentyx  started 09/14/2023  Procedures:  No procedures performed Allergies: Patient has no known allergies.   Assessment / Plan:     Visit Diagnoses: No diagnosis found.  Orders: No orders of the defined types were placed in this encounter.  No orders of the defined types were placed in this encounter.   Face-to-face time spent with patient was  *** minutes. Greater than 50% of time was spent in counseling and coordination of care.  Follow-Up Instructions: No follow-ups on file.   Daved JAYSON Gavel, CMA  Note - This record has been created using Animal nutritionist.  Chart creation errors have been sought, but may not always  have been located. Such creation errors do not reflect on  the standard of medical care.    [1]  Social History Tobacco Use   Smoking status: Every Day    Current packs/day: 0.50    Average packs/day: 0.5 packs/day for 20.0 years (10.0 ttl pk-yrs)    Types: Cigarettes    Passive exposure: Current   Smokeless tobacco: Never  Vaping Use   Vaping status: Some Days  Substance Use Topics   Alcohol use: Yes    Alcohol/week: 10.0 standard drinks of alcohol    Types: 10 Glasses of wine per week    Comment: Glasses of wine several times a week   Drug use: Yes    Types: Marijuana  Comment: 1 monthly   "

## 2024-01-27 ENCOUNTER — Other Ambulatory Visit (HOSPITAL_COMMUNITY): Payer: Self-pay

## 2024-01-27 NOTE — Telephone Encounter (Signed)
 Submitted Patient Assistance Application to [employer] for SKYRIZI  SQ along with patient portion, provider portion, insurance card copy, prior authorization approval, current medication list, and income documents. Will update patient when we receive a response.  Phone: [phone] Fax: [phone]

## 2024-01-30 ENCOUNTER — Other Ambulatory Visit: Payer: Self-pay

## 2024-01-30 ENCOUNTER — Other Ambulatory Visit (HOSPITAL_COMMUNITY): Payer: Self-pay

## 2024-01-30 NOTE — Progress Notes (Unsigned)
 Benefits Investigation Started  Reason: Copay is $100  Routed to: Rx Rheum/Pulm [place])

## 2024-01-31 NOTE — Progress Notes (Unsigned)
 Pausing enrollment while PAP enrollment is being reviewed.

## 2024-02-06 ENCOUNTER — Ambulatory Visit: Admitting: Physician Assistant

## 2024-02-06 DIAGNOSIS — Z981 Arthrodesis status: Secondary | ICD-10-CM

## 2024-02-06 DIAGNOSIS — L409 Psoriasis, unspecified: Secondary | ICD-10-CM

## 2024-02-06 DIAGNOSIS — M7061 Trochanteric bursitis, right hip: Secondary | ICD-10-CM

## 2024-02-06 DIAGNOSIS — G620 Drug-induced polyneuropathy: Secondary | ICD-10-CM

## 2024-02-06 DIAGNOSIS — D6481 Anemia due to antineoplastic chemotherapy: Secondary | ICD-10-CM

## 2024-02-06 DIAGNOSIS — Z79899 Other long term (current) drug therapy: Secondary | ICD-10-CM

## 2024-02-06 DIAGNOSIS — Z79890 Hormone replacement therapy (postmenopausal): Secondary | ICD-10-CM

## 2024-02-06 DIAGNOSIS — I6522 Occlusion and stenosis of left carotid artery: Secondary | ICD-10-CM

## 2024-02-06 DIAGNOSIS — M25432 Effusion, left wrist: Secondary | ICD-10-CM

## 2024-02-06 DIAGNOSIS — E222 Syndrome of inappropriate secretion of antidiuretic hormone: Secondary | ICD-10-CM

## 2024-02-06 DIAGNOSIS — M79641 Pain in right hand: Secondary | ICD-10-CM

## 2024-02-06 DIAGNOSIS — F17200 Nicotine dependence, unspecified, uncomplicated: Secondary | ICD-10-CM

## 2024-02-06 DIAGNOSIS — H15102 Unspecified episcleritis, left eye: Secondary | ICD-10-CM

## 2024-02-06 DIAGNOSIS — M47816 Spondylosis without myelopathy or radiculopathy, lumbar region: Secondary | ICD-10-CM

## 2024-02-06 DIAGNOSIS — Z170 Estrogen receptor positive status [ER+]: Secondary | ICD-10-CM

## 2024-02-06 DIAGNOSIS — R7689 Other specified abnormal immunological findings in serum: Secondary | ICD-10-CM

## 2024-02-06 DIAGNOSIS — L4050 Arthropathic psoriasis, unspecified: Secondary | ICD-10-CM

## 2024-02-06 DIAGNOSIS — M5030 Other cervical disc degeneration, unspecified cervical region: Secondary | ICD-10-CM

## 2024-02-07 NOTE — Progress Notes (Unsigned)
 "  Office Visit Note  Patient: Kim Ortega             Date of Birth: 04/17/1955           MRN: [id number]             PCP: Duanne Butler DASEN, MD Referring: Duanne Butler DASEN, MD Visit Date: 02/09/2024 Occupation: Data Unavailable  Subjective:  Pain in both hands   History of Present Illness: Kim Ortega is a 69 y.o. female with history of psoriatic arthritis.  Patient was started on Skyrizi  December 2025 and has had a total of 2 doses.  Patient is tolerating Skyrizi  without any side effects.  The diarrhea she was experiencing with Cosentyx  has resolved.  Patient presents today experiencing increased pain and inflammation in both hands and both wrist joints.  Her symptoms have been most severe first thing in the mornings but improves as the day goes on.  She has rarely had any nocturnal pain.  She has been using compression gloves at night and as needed for relief. She denies any recent or recurrent infections.   Activities of Daily Living:  Patient reports morning stiffness for 4 hours.   Patient Reports nocturnal pain.  Difficulty dressing/grooming: Denies Difficulty climbing stairs: Denies Difficulty getting out of chair: Denies Difficulty using hands for taps, buttons, cutlery, and/or writing: Reports  Review of Systems  Constitutional:  Positive for fatigue.  HENT:  Positive for mouth dryness. Negative for mouth sores.   Eyes:  Negative for dryness.  Respiratory:  Negative for shortness of breath.   Cardiovascular:  Negative for chest pain and palpitations.  Gastrointestinal:  Negative for blood in stool, constipation and diarrhea.  Endocrine: Negative for increased urination.  Genitourinary:  Negative for involuntary urination.  Musculoskeletal:  Positive for joint pain, joint pain, joint swelling, morning stiffness and muscle tenderness. Negative for gait problem, myalgias, muscle weakness and myalgias.  Skin:  Negative for color change, rash, hair loss and sensitivity to  sunlight.  Allergic/Immunologic: Negative for susceptible to infections.  Neurological:  Negative for dizziness and headaches.  Hematological:  Negative for swollen glands.  Psychiatric/Behavioral:  Negative for depressed mood and sleep disturbance. The patient is not nervous/anxious.     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) 10/06/2015   Peripheral neuropathy due to chemotherapy 10/06/2015   Antineoplastic chemotherapy induced anemia 08/27/2015   Breast cancer of lower-inner quadrant of left female breast (HCC) 07/03/2015   Left carotid artery stenosis    Psoriasis    Hormone replacement therapy    Smoker    Episcleritis     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 ,grade 1   COPD (chronic obstructive pulmonary disease) (HCC)    Episcleritis    Hormone replacement therapy    Hyperlipidemia    Hypertension    Left carotid artery stenosis    40-59% (01/2015)   Macular degeneration    Macular degeneration, age related    Personal history of  chemotherapy 2017   neoadjuvant   Personal history of radiation therapy 2018   Psoriasis    SIADH (syndrome of inappropriate ADH production)    Smoker     Family History  Problem Relation Age of Onset   Stroke Mother    Colon polyps Sister    Colon cancer Brother    Esophageal cancer Neg Hx    Rectal cancer Neg Hx    Stomach cancer Neg Hx    Breast cancer Neg Hx    Past Surgical History:  Procedure Laterality Date   ABDOMINAL HYSTERECTOMY     BREAST BIOPSY Left  06/2015   BREAST LUMPECTOMY Left 01/2016   BREAST LUMPECTOMY WITH RADIOACTIVE SEED AND SENTINEL LYMPH NODE BIOPSY Left 01/08/2016   Procedure: LEFT BREAST LUMPECTOMY WITH RADIOACTIVE SEED AND SENTINEL LYMPH NODE BIOPSY, BLUE DYE INJECTION;  Surgeon: Donnice Bury, MD;  Location: [facility];  Service: General;  Laterality: Left;   cervical acdf     C5-6- fusion with plates/screws Dr. Alix '12   COLONOSCOPY WITH PROPOFOL  N/A 04/16/2014   Procedure: COLONOSCOPY WITH PROPOFOL ;  Surgeon: Gladis MARLA Louder, MD;  Location: [facility];  Service: Endoscopy;  Laterality: N/A;   COLPOSCOPY     laporotomy     Ovarian cyst   PORT-A-CATH REMOVAL Right 01/08/2016   Procedure: REMOVAL PORT-A-CATH;  Surgeon: Donnice Bury, MD;  Location: [facility];  Service: General;  Laterality: Right;   PORTACATH PLACEMENT N/A 07/24/2015   Procedure: INSERTION PORT-A-CATH WITH US ;  Surgeon: Donnice Bury, MD;  Location[facility];  Service: General;  Laterality: N/A;   ROBOTIC ASSISTED SALPINGO OOPHERECTOMY Bilateral 10/10/2018   Procedure: XI ROBOTIC ASSISTED SALPINGO OOPHORECTOMY;  Surgeon: Eloy Herring, MD;  Location: [facility];  Service: Gynecology;  Laterality: Bilateral;   TONSILLECTOMY     child   Social History[1] Social History   Social History Narrative   Married x 25 years.   Family lives nearby.     Immunization History  Administered Date(s) Administered   Influenza Split 11/04/2012   Influenza, Seasonal, Injecte, Preservative Fre 09/16/2022   Influenza,inj,Quad PF,6+ Mos 10/25/2016, 09/20/2018   Influenza-Unspecified 11/04/2013   MODERNA COVID-19 SARS-COV-2 PEDS BIVALENT BOOSTER 21yr-57yr 03/27/2019, 04/24/2019   Pneumococcal Polysaccharide-23 10/25/2016   Tdap 07/04/2009, 07/01/2023   Zoster Recombinant(Shingrix) 07/01/2023     Objective: Vital Signs: BP 120/76   Pulse 90   Temp 97.9 F (36.6 C)   Resp 16   Ht 5' 4 (1.626 m)   Wt 106 lb (48.1 kg)    BMI 18.19 kg/m    Physical Exam Vitals and nursing note reviewed.  Constitutional:      Appearance: She is well-developed.  HENT:     Head: Normocephalic and atraumatic.  Eyes:     Conjunctiva/sclera: Conjunctivae normal.  Cardiovascular:     Rate and Rhythm: Normal rate and regular rhythm.     Heart sounds: Normal heart sounds.  Pulmonary:     Effort: Pulmonary effort is normal.     Breath sounds: Normal breath sounds.  Abdominal:     General: Bowel sounds are normal.     Palpations: Abdomen is soft.  Musculoskeletal:     Cervical back: Normal range of motion.  Lymphadenopathy:     Cervical: No cervical adenopathy.  Skin:    General: Skin is warm and dry.     Capillary Refill: Capillary refill takes less than 2 seconds.  Neurological:     Mental Status: She is alert  and oriented to person, place, and time.  Psychiatric:        Behavior: Behavior normal.      Musculoskeletal Exam: C-spine has limited ROM with lateral rotation.  No midline spinal tenderness.  No SI joint tenderness.  Shoulder joints and elbow joints have good ROM.  Tenderness of both wrist joints.  Inflammation and tenderness of the right 2nd and 3rd PIP joints.  Inflammation in several MCP and PIP joints.  Hip joints have good ROM with no groin pain.  Knee joints have good ROM with no warmth or effusion.  Ankle joints have good ROM with no tenderness or joint swelling.   CDAI Exam: CDAI Score: -- Patient Global: --; Provider Global: -- Swollen: 5 ; Tender: 4  Joint Exam 02/09/2024      Right  Left  Wrist   Tender   Tender  MCP 2  Swollen      IP (thumb)     Swollen   PIP 2 (finger)  Swollen Tender     PIP 3 (finger)  Swollen Tender     PIP 5 (finger)  Swollen         Investigation: No additional findings.  Imaging: No results found.  Recent Labs: Lab Results  Component Value Date   WBC 7.7 10/27/2023   HGB 12.3 10/27/2023   PLT 387 10/27/2023   NA 134 (L) 12/05/2023   K 4.4 12/05/2023    CL 95 (L) 12/05/2023   CO2 30 12/05/2023   GLUCOSE 102 (H) 12/05/2023   BUN 11 12/05/2023   CREATININE 0.79 12/05/2023   BILITOT 0.5 12/05/2023   ALKPHOS 77 03/12/2019   AST 57 (H) 12/05/2023   ALT 42 (H) 12/05/2023   PROT 7.2 12/05/2023   ALBUMIN 4.2 03/12/2019   CALCIUM  9.9 12/05/2023   GFRAA 100 06/17/2020   QFTBGOLDPLUS NEGATIVE 10/27/2023    Speciality Comments: Tremfya stopped 11/15/2022, Taltz  started 12/13/2022; Cosentyx  started 09/14/2023  Procedures:  No procedures performed Allergies: Patient has no known allergies.   Assessment / Plan:     Visit Diagnoses: Psoriatic arthropathy [facility]): Patient presents today with an exacerbation of pain and inflammation affecting both hands and both wrist joints.  She has been experiencing more pain first thing in the mornings as well as some difficulty with fine motor skills.  On examination she has some active inflammation as described above.  She was initiated on Skyrizi  in December 2025 and has a total of 2 doses.  She is tolerating Skyrizi  without any side effects.  Her symptoms seem better controlled while on Cosentyx  but the diarrhea she was experiencing while on Cosentyx  has resolved.  Discussed the option of adding methotrexate  as combination therapy but she would like to hold off at this time.  She plans on continuing to use arthritis gloves and will use Voltaren  gel topically for pain relief.  She is willing to get Skyrizi  more time and for us  to reassess for the full efficacy.  She was advised to notify us  if her symptoms persist or worsen.  Psoriasis: Scalp--Continue skyrizi  as prescribed.   High risk medication use - Skyrizi  150 mg sq injections every 12 weeks.  Skyrizi  initiated in December 2025. TB gold negative on 10/27/23.  CBC updated on 10/27/23 CMP updated on 12/05/23 Lipid panel updated on 12/05/23.   Previous treatment: Methotrexate , cosentyx , taltz , tremfya  No recent or recurrent infections. - Plan: CBC with  Differential/Platelet, Comprehensive metabolic panel with GFR  Rheumatoid factor positive - -Rheumatoid  factor 24, anti-CCP negative, normal, CRP negative  Episcleritis of left eye: Not currently symptomatic.  No conjunctival injection noted.  No left eye pain currently.  Pain in both hands: Patient presents today experiencing increased pain and stiffness affecting both hands.  Inflammation noted in multiple joints as described above.  Patient was initiated on Skyrizi  in December 2025 and has had a total of 2 doses.  Discussed the option of reinitiating methotrexate  as combination therapy but she would like to hold off at this time.  She has been using compression arthritis gloves.  Discussed use of Voltaren  gel which she can buy topically for pain relief.  Swelling of left wrist: Patient presents today with increased pain in both wrist joints, left worse than right.  She has tenderness to palpation bilaterally but no synovitis was apparent today.  No tenosynovitis noted.  Discussed the option of adding methotrexate  as combination therapy but she would like to hold off at this time.  S/P cervical spinal fusion: Limited range of motion with lateral rotation.  DDD (degenerative disc disease), cervical: Limited range of motion with lateral rotation.  Spondylosis of lumbar spine - MRI January 12, 2021 showed mild degeneration with foraminal stenosis. Epidural injection by Dr. Eldonna in April 2023 relieved pain.  No symptoms of radiculopathy at this time.  Trochanteric bursitis of both hips: Not currently symptomatic.  No tenderness upon palpation.   Other medical conditions are listed as follows:   Antineoplastic chemotherapy induced anemia  Malignant neoplasm of lower-inner quadrant of left breast in female, estrogen receptor positive (HCC)  Chemotherapy-induced peripheral neuropathy  Left carotid artery stenosis  Hormone replacement therapy  Smoker  SIADH (syndrome of inappropriate ADH  production)  Orders: Orders Placed This Encounter  Procedures   CBC with Differential/Platelet   Comprehensive metabolic panel with GFR   No orders of the defined types were placed in this encounter.     Follow-Up Instructions: Return in about 3 months (around 05/08/2024) for Psoriatic arthritis.   Waddell CHRISTELLA Craze, PA-C  Note - This record has been created using Dragon software.  Chart creation errors have been sought, but may not always  have been located. Such creation errors do not reflect on  the standard of medical care.      [1]  Social History Tobacco Use   Smoking status: Every Day    Current packs/day: 0.50    Average packs/day: 0.5 packs/day for 20.0 years (10.0 ttl pk-yrs)    Types: Cigarettes    Passive exposure: Current   Smokeless tobacco: Never  Vaping Use   Vaping status: Former  Substance Use Topics   Alcohol use: Yes    Alcohol/week: 6.0 standard drinks of alcohol    Types: 6 Glasses of wine per week    Comment: Glasses of wine several times a week   Drug use: Yes    Types: Marijuana    Comment: 1 monthly   "

## 2024-02-08 ENCOUNTER — Ambulatory Visit: Admitting: Physician Assistant

## 2024-02-08 ENCOUNTER — Encounter: Payer: Self-pay | Admitting: Family Medicine

## 2024-02-08 ENCOUNTER — Other Ambulatory Visit: Payer: Self-pay | Admitting: Family Medicine

## 2024-02-08 MED ORDER — HYDROCODONE-ACETAMINOPHEN 5-325 MG PO TABS: 1.0000 | ORAL_TABLET | Freq: Three times a day (TID) | ORAL | 0 refills | Status: AC | PRN

## 2024-02-09 ENCOUNTER — Encounter: Payer: Self-pay | Admitting: Physician Assistant

## 2024-02-09 ENCOUNTER — Ambulatory Visit: Admitting: Physician Assistant

## 2024-02-09 VITALS — BP 120/76 | HR 90 | Temp 97.9°F | Resp 16 | Ht 64.0 in | Wt 106.0 lb

## 2024-02-09 DIAGNOSIS — M7061 Trochanteric bursitis, right hip: Secondary | ICD-10-CM

## 2024-02-09 DIAGNOSIS — I6522 Occlusion and stenosis of left carotid artery: Secondary | ICD-10-CM

## 2024-02-09 DIAGNOSIS — H15102 Unspecified episcleritis, left eye: Secondary | ICD-10-CM | POA: Diagnosis not present

## 2024-02-09 DIAGNOSIS — G620 Drug-induced polyneuropathy: Secondary | ICD-10-CM

## 2024-02-09 DIAGNOSIS — E222 Syndrome of inappropriate secretion of antidiuretic hormone: Secondary | ICD-10-CM

## 2024-02-09 DIAGNOSIS — M47816 Spondylosis without myelopathy or radiculopathy, lumbar region: Secondary | ICD-10-CM

## 2024-02-09 DIAGNOSIS — M25432 Effusion, left wrist: Secondary | ICD-10-CM

## 2024-02-09 DIAGNOSIS — L4050 Arthropathic psoriasis, unspecified: Secondary | ICD-10-CM

## 2024-02-09 DIAGNOSIS — L409 Psoriasis, unspecified: Secondary | ICD-10-CM | POA: Diagnosis not present

## 2024-02-09 DIAGNOSIS — T451X5A Adverse effect of antineoplastic and immunosuppressive drugs, initial encounter: Secondary | ICD-10-CM

## 2024-02-09 DIAGNOSIS — D6481 Anemia due to antineoplastic chemotherapy: Secondary | ICD-10-CM

## 2024-02-09 DIAGNOSIS — Z170 Estrogen receptor positive status [ER+]: Secondary | ICD-10-CM

## 2024-02-09 DIAGNOSIS — Z79899 Other long term (current) drug therapy: Secondary | ICD-10-CM

## 2024-02-09 DIAGNOSIS — M5030 Other cervical disc degeneration, unspecified cervical region: Secondary | ICD-10-CM

## 2024-02-09 DIAGNOSIS — M79641 Pain in right hand: Secondary | ICD-10-CM

## 2024-02-09 DIAGNOSIS — R7689 Other specified abnormal immunological findings in serum: Secondary | ICD-10-CM | POA: Diagnosis not present

## 2024-02-09 DIAGNOSIS — Z981 Arthrodesis status: Secondary | ICD-10-CM | POA: Diagnosis not present

## 2024-02-09 DIAGNOSIS — C50312 Malignant neoplasm of lower-inner quadrant of left female breast: Secondary | ICD-10-CM

## 2024-02-09 DIAGNOSIS — Z79890 Hormone replacement therapy (postmenopausal): Secondary | ICD-10-CM

## 2024-02-09 DIAGNOSIS — F17200 Nicotine dependence, unspecified, uncomplicated: Secondary | ICD-10-CM

## 2024-02-09 DIAGNOSIS — M79642 Pain in left hand: Secondary | ICD-10-CM

## 2024-02-09 DIAGNOSIS — M7062 Trochanteric bursitis, left hip: Secondary | ICD-10-CM

## 2024-02-10 ENCOUNTER — Ambulatory Visit: Payer: Self-pay | Admitting: Physician Assistant

## 2024-02-10 LAB — CBC WITH DIFFERENTIAL/PLATELET
Absolute Lymphocytes: 2049 {cells}/uL (ref 850–3900)
Absolute Monocytes: 850 {cells}/uL (ref 200–950)
Basophils Absolute: 44 {cells}/uL (ref 0–200)
Basophils Relative: 0.4 %
Eosinophils Absolute: 87 {cells}/uL (ref 15–500)
Eosinophils Relative: 0.8 %
HCT: 38.1 % (ref 35.9–46.0)
Hemoglobin: 12.6 g/dL (ref 11.7–15.5)
MCH: 34.2 pg — ABNORMAL HIGH (ref 27.0–33.0)
MCHC: 33.1 g/dL (ref 31.6–35.4)
MCV: 103.5 fL — ABNORMAL HIGH (ref 81.4–101.7)
MPV: 9.5 fL (ref 7.5–12.5)
Monocytes Relative: 7.8 %
Neutro Abs: 7870 {cells}/uL — ABNORMAL HIGH (ref 1500–7800)
Neutrophils Relative %: 72.2 %
Platelets: 342 10*3/uL (ref 140–400)
RBC: 3.68 Million/uL — ABNORMAL LOW (ref 3.80–5.10)
RDW: 12.2 % (ref 11.0–15.0)
Total Lymphocyte: 18.8 %
WBC: 10.9 10*3/uL — ABNORMAL HIGH (ref 3.8–10.8)

## 2024-02-10 LAB — COMPREHENSIVE METABOLIC PANEL WITH GFR
AG Ratio: 1.2 (calc) (ref 1.0–2.5)
ALT: 15 U/L (ref 6–29)
AST: 22 U/L (ref 10–35)
Albumin: 3.8 g/dL (ref 3.6–5.1)
Alkaline phosphatase (APISO): 103 U/L (ref 37–153)
BUN: 12 mg/dL (ref 7–25)
CO2: 26 mmol/L (ref 20–32)
Calcium: 9.7 mg/dL (ref 8.6–10.4)
Chloride: 100 mmol/L (ref 98–110)
Creat: 0.63 mg/dL (ref 0.50–1.05)
Globulin: 3.1 g/dL (ref 1.9–3.7)
Glucose, Bld: 86 mg/dL (ref 65–99)
Potassium: 4.6 mmol/L (ref 3.5–5.3)
Sodium: 135 mmol/L (ref 135–146)
Total Bilirubin: 0.4 mg/dL (ref 0.2–1.2)
Total Protein: 6.9 g/dL (ref 6.1–8.1)
eGFR: 97 mL/min/{1.73_m2}

## 2024-02-10 NOTE — Progress Notes (Signed)
 CMP WNL  WBC count is elevated-absolute neutrophils are elevated. Please clarify if she has had a recent infection?  If not, may be related to inflammation.   MCV and MCH remain elevated but are trending down.   RBC count is low but improving.

## 2024-04-19 ENCOUNTER — Ambulatory Visit: Admitting: Hematology and Oncology

## 2024-05-08 ENCOUNTER — Ambulatory Visit: Admitting: Physician Assistant

## 2024-10-10 ENCOUNTER — Ambulatory Visit
